# Patient Record
Sex: Male | Born: 1960 | Race: Black or African American | Hispanic: No | Marital: Single | Smoking: Current every day smoker
Health system: Southern US, Community
[De-identification: ages and names within clinical notes are randomized; demographics above are authoritative.]

## PROBLEM LIST (undated history)

## (undated) DIAGNOSIS — F419 Anxiety disorder, unspecified: Secondary | ICD-10-CM

## (undated) DIAGNOSIS — M1990 Unspecified osteoarthritis, unspecified site: Secondary | ICD-10-CM

## (undated) DIAGNOSIS — M109 Gout, unspecified: Secondary | ICD-10-CM

## (undated) DIAGNOSIS — I10 Essential (primary) hypertension: Secondary | ICD-10-CM

## (undated) HISTORY — DX: Unspecified osteoarthritis, unspecified site: M19.90

## (undated) HISTORY — PX: CYST REMOVAL TRUNK: SHX6283

## (undated) HISTORY — DX: Gout, unspecified: M10.9

## (undated) HISTORY — DX: Anxiety disorder, unspecified: F41.9

## (undated) HISTORY — DX: Essential (primary) hypertension: I10

## (undated) HISTORY — PX: SPLENECTOMY, TOTAL: SHX788

---

## 2007-09-13 ENCOUNTER — Emergency Department: Payer: Self-pay | Admitting: Emergency Medicine

## 2007-10-16 ENCOUNTER — Emergency Department: Payer: Self-pay | Admitting: Emergency Medicine

## 2008-01-04 ENCOUNTER — Emergency Department: Payer: Self-pay | Admitting: Emergency Medicine

## 2010-02-27 ENCOUNTER — Emergency Department: Payer: Self-pay | Admitting: Emergency Medicine

## 2011-03-13 ENCOUNTER — Emergency Department: Payer: Self-pay | Admitting: Emergency Medicine

## 2012-08-13 ENCOUNTER — Emergency Department: Payer: Self-pay | Admitting: Internal Medicine

## 2013-10-26 LAB — PSA: PSA: 0.4

## 2014-08-02 DIAGNOSIS — I10 Essential (primary) hypertension: Secondary | ICD-10-CM | POA: Insufficient documentation

## 2014-10-02 DIAGNOSIS — M1990 Unspecified osteoarthritis, unspecified site: Secondary | ICD-10-CM | POA: Insufficient documentation

## 2015-01-01 ENCOUNTER — Ambulatory Visit: Payer: Self-pay

## 2015-01-01 DIAGNOSIS — Z720 Tobacco use: Secondary | ICD-10-CM | POA: Insufficient documentation

## 2015-01-01 DIAGNOSIS — F1010 Alcohol abuse, uncomplicated: Secondary | ICD-10-CM | POA: Insufficient documentation

## 2015-01-01 LAB — BASIC METABOLIC PANEL
BUN: 15 mg/dL (ref 4–21)
Creatinine: 1.2 mg/dL (ref 0.6–1.3)
Glucose: 100 mg/dL
Potassium: 5.3 mmol/L (ref 3.4–5.3)
Sodium: 140 mmol/L (ref 137–147)

## 2015-01-01 LAB — CBC AND DIFFERENTIAL
HCT: 47 % (ref 41–53)
Hemoglobin: 15.9 g/dL (ref 13.5–17.5)
NEUTROS ABS: 3 /uL
PLATELETS: 259 10*3/uL (ref 150–399)
WBC: 6.8 10*3/mL

## 2015-01-01 LAB — LIPID PANEL
Cholesterol: 159 mg/dL (ref 0–200)
HDL: 66 mg/dL (ref 35–70)
LDL CALC: 80 mg/dL
TRIGLYCERIDES: 66 mg/dL (ref 40–160)

## 2015-01-01 LAB — HEPATIC FUNCTION PANEL
ALK PHOS: 162 U/L — AB (ref 25–125)
ALT: 18 U/L (ref 10–40)
AST: 21 U/L (ref 14–40)
BILIRUBIN, TOTAL: 0.2 mg/dL

## 2015-01-01 LAB — TSH: TSH: 2.91 u[IU]/mL (ref 0.41–5.90)

## 2015-09-18 ENCOUNTER — Telehealth: Payer: Self-pay

## 2015-09-18 NOTE — Telephone Encounter (Signed)
Received fax from medication management to refill hydroxyzine Hcl, pt has not been seen at [facility] since July 2016 needs to make and keep an appointment before we can refill any meds sent fax to medication management advising them .

## 2015-11-05 ENCOUNTER — Other Ambulatory Visit: Payer: Self-pay | Admitting: Nurse Practitioner

## 2015-11-27 ENCOUNTER — Other Ambulatory Visit: Payer: Self-pay | Admitting: Nurse Practitioner

## 2015-12-12 ENCOUNTER — Other Ambulatory Visit: Payer: Self-pay | Admitting: Urology

## 2016-01-28 DIAGNOSIS — M1990 Unspecified osteoarthritis, unspecified site: Secondary | ICD-10-CM

## 2016-01-28 DIAGNOSIS — Z720 Tobacco use: Secondary | ICD-10-CM

## 2016-01-28 DIAGNOSIS — I10 Essential (primary) hypertension: Secondary | ICD-10-CM

## 2016-01-28 DIAGNOSIS — F1010 Alcohol abuse, uncomplicated: Secondary | ICD-10-CM

## 2016-02-27 ENCOUNTER — Other Ambulatory Visit: Payer: Self-pay | Admitting: Urology

## 2016-04-23 ENCOUNTER — Other Ambulatory Visit: Payer: Self-pay | Admitting: Nurse Practitioner

## 2018-09-04 ENCOUNTER — Other Ambulatory Visit: Payer: Self-pay

## 2018-09-04 ENCOUNTER — Emergency Department
Admission: EM | Admit: 2018-09-04 | Discharge: 2018-09-04 | Disposition: A | Discharge status: Home / Self Care | Payer: Self-pay | Attending: Emergency Medicine | Admitting: Emergency Medicine

## 2018-09-04 ENCOUNTER — Encounter: Payer: Self-pay | Admitting: Emergency Medicine

## 2018-09-04 DIAGNOSIS — L03113 Cellulitis of right upper limb: Secondary | ICD-10-CM | POA: Insufficient documentation

## 2018-09-04 DIAGNOSIS — I10 Essential (primary) hypertension: Secondary | ICD-10-CM | POA: Insufficient documentation

## 2018-09-04 DIAGNOSIS — Z79899 Other long term (current) drug therapy: Secondary | ICD-10-CM | POA: Insufficient documentation

## 2018-09-04 DIAGNOSIS — F17210 Nicotine dependence, cigarettes, uncomplicated: Secondary | ICD-10-CM | POA: Insufficient documentation

## 2018-09-04 LAB — CBC WITH DIFFERENTIAL/PLATELET
Abs Immature Granulocytes: 0.01 10*3/uL (ref 0.00–0.07)
Basophils Absolute: 0 10*3/uL (ref 0.0–0.1)
Basophils Relative: 0 %
Eosinophils Absolute: 0.1 10*3/uL (ref 0.0–0.5)
Eosinophils Relative: 1 %
HCT: 42.9 % (ref 39.0–52.0)
Hemoglobin: 14.4 g/dL (ref 13.0–17.0)
Immature Granulocytes: 0 %
Lymphocytes Relative: 23 %
Lymphs Abs: 1.2 10*3/uL (ref 0.7–4.0)
MCH: 31.6 pg (ref 26.0–34.0)
MCHC: 33.6 g/dL (ref 30.0–36.0)
MCV: 94.3 fL (ref 80.0–100.0)
Monocytes Absolute: 0.4 10*3/uL (ref 0.1–1.0)
Monocytes Relative: 7 %
Neutro Abs: 3.5 10*3/uL (ref 1.7–7.7)
Neutrophils Relative %: 69 %
Platelets: 186 10*3/uL (ref 150–400)
RBC: 4.55 MIL/uL (ref 4.22–5.81)
RDW: 12.6 % (ref 11.5–15.5)
WBC: 5.1 10*3/uL (ref 4.0–10.5)
nRBC: 0 % (ref 0.0–0.2)

## 2018-09-04 LAB — COMPREHENSIVE METABOLIC PANEL
ALT: 19 U/L (ref 0–44)
AST: 21 U/L (ref 15–41)
Albumin: 3.8 g/dL (ref 3.5–5.0)
Alkaline Phosphatase: 116 U/L (ref 38–126)
Anion gap: 9 (ref 5–15)
BUN: 16 mg/dL (ref 6–20)
CO2: 22 mmol/L (ref 22–32)
Calcium: 8.7 mg/dL — ABNORMAL LOW (ref 8.9–10.3)
Chloride: 107 mmol/L (ref 98–111)
Creatinine, Ser: 1.03 mg/dL (ref 0.61–1.24)
GFR calc Af Amer: >60 mL/min (ref >60)
GFR calc non Af Amer: >60 mL/min (ref >60)
Glucose, Bld: 86 mg/dL (ref 70–99)
Potassium: 3.4 mmol/L — ABNORMAL LOW (ref 3.5–5.1)
Sodium: 138 mmol/L (ref 135–145)
Total Bilirubin: 0.6 mg/dL (ref 0.3–1.2)
Total Protein: 7.6 g/dL (ref 6.5–8.1)

## 2018-09-04 LAB — LACTIC ACID, PLASMA: Lactic Acid, Venous: 0.8 mmol/L (ref 0.5–1.9)

## 2018-09-04 MED ORDER — SULFAMETHOXAZOLE-TRIMETHOPRIM 800-160 MG PO TABS
1.0000 | ORAL_TABLET | Freq: Once | ORAL | Status: AC
  Administered 2018-09-04: 1 via ORAL
  Filled 2018-09-04: qty 1

## 2018-09-04 MED ORDER — ACETAMINOPHEN 500 MG PO TABS
1000.0000 mg | ORAL_TABLET | Freq: Once | ORAL | Status: AC
  Administered 2018-09-04: 1000 mg via ORAL

## 2018-09-04 MED ORDER — ACETAMINOPHEN 500 MG PO TABS
ORAL_TABLET | ORAL | Status: AC
  Administered 2018-09-04: 1000 mg via ORAL
  Filled 2018-09-04: qty 2

## 2018-09-04 MED ORDER — CEPHALEXIN 500 MG PO CAPS: 500.0000 mg | ORAL_CAPSULE | Freq: Three times a day (TID) | ORAL | 0 refills | Status: AC

## 2018-09-04 MED ORDER — CEPHALEXIN 500 MG PO CAPS
500.0000 mg | ORAL_CAPSULE | Freq: Once | ORAL | Status: AC
  Administered 2018-09-04: 500 mg via ORAL
  Filled 2018-09-04: qty 1

## 2018-09-04 MED ORDER — SULFAMETHOXAZOLE-TRIMETHOPRIM 800-160 MG PO TABS: 1.0000 | ORAL_TABLET | Freq: Two times a day (BID) | ORAL | 0 refills | Status: AC

## 2018-09-04 NOTE — ED Provider Notes (Signed)
[facility] Emergency Department Provider Note  ____________________________________________  Time seen: Approximately 5:56 PM  I have reviewed the triage vital signs and the nursing notes.   HISTORY  Chief Complaint Insect Bite    HPI Caleb Brooks is a 58 y.o. male presents to the emergency department with cellulitis of the right forearm with streaking into the upper arm.  Patient has had low-grade fever at home but denies chills.  Patient reports that 2 days ago, he was working in his yard.  He denies bites or stings from insects or other animals.  Patient reports that he awoke the next day with a rash around the wrist that looked like a small amount of blister formation.  Patient reports that rash worsened the next day and now erythema spans approximately 80% of forearm.  Patient is also noticed some right hand swelling.  He denies recent travel, prolonged immobilization or recent surgery.  He has a daily smoker.  No pleuritic chest pain or cough.  No prior history of hypercoagulable conditions.  He denies history of lower extremity DVT.  He denies prior history of cellulitis and IV drug use.  He denies history of diabetes or other immunosuppressive conditions.  No alleviating measures have been attempted        Past Medical History:  Diagnosis Date  . Anxiety   . Arthritis   . Hypertension     Patient Active Problem List   Diagnosis Date Noted  . Tobacco abuse 01/01/2015  . ETOH abuse 01/01/2015  . Arthritis 10/02/2014  . Hypertension 08/02/2014    Past Surgical History:  Procedure Laterality Date  . CYST REMOVAL TRUNK     Groin area around 2000    Prior to Admission medications   Medication Sig Start Date End Date Taking? Authorizing Provider  CELEBREX 100 MG capsule TAKE 1 CAPSULE BY MOUTH 1 OR 2 TIMES A DAY 12/12/15   McGowan, Larene Beach A, PA-C  cephALEXin (KEFLEX) 500 MG capsule Take 1 capsule (500 mg total) by mouth 3 (three) times daily for  7 days. 09/04/18 09/11/18  Lannie Fields, PA-C  gabapentin (NEURONTIN) 100 MG capsule Take 100 mg by mouth 2 (two) times daily.    [provider]  hydrochlorothiazide (HYDRODIURIL) 25 MG tablet Take 25 mg by mouth daily.    [provider]  loratadine (CLARITIN) 10 MG tablet Take 10 mg by mouth daily.    [provider]  sulfamethoxazole-trimethoprim (BACTRIM DS,SEPTRA DS) 800-160 MG tablet Take 1 tablet by mouth 2 (two) times daily for 7 days. 09/04/18 09/11/18  Lannie Fields, PA-C    Allergies Ibuprofen  Family History  Problem Relation Age of Onset  . Diabetes Father   . Hypertension Father   . Congestive Heart Failure Father   . Hypertension Sister   . CVA Sister   . Bipolar disorder Brother   . Schizophrenia Brother     Social History Social History   Tobacco Use  . Smoking status: Current Every Day Smoker    Packs/day: 0.50  . Smokeless tobacco: Never Used  Substance Use Topics  . Alcohol use: Yes    Comment: 40oz a day  . Drug use: Not on file     Review of Systems  Constitutional: No fever/chills Eyes: No visual changes. No discharge ENT: No upper respiratory complaints. Cardiovascular: no chest pain. Respiratory: no cough. No SOB. Gastrointestinal: No abdominal pain.  No nausea, no vomiting.  No diarrhea.  No constipation. Genitourinary:  Negative for dysuria. No hematuria Musculoskeletal: Negative for musculoskeletal pain. Skin: Patient has cellulitis.  Neurological: Negative for headaches, focal weakness or numbness.   ____________________________________________   PHYSICAL EXAM:  VITAL SIGNS: ED Triage Vitals  Enc Vitals Group     BP 09/04/18 1605 (!) 147/107     Pulse Rate 09/04/18 1605 72     Resp 09/04/18 1605 14     Temp 09/04/18 1605 99.1 F (37.3 C)     Temp Source 09/04/18 1605 Oral     SpO2 09/04/18 1605 100 %     Weight 09/04/18 1602 185 lb (83.9 kg)     Height 09/04/18 1602 5\' 11"  (1.803 m)     Head  Circumference --      Peak Flow --      Pain Score 09/04/18 1602 7     Pain Loc --      Pain Edu? --      Excl. in [place]? --      Constitutional: Alert and oriented. Well appearing and in no acute distress. Eyes: Conjunctivae are normal. PERRL. EOMI. Head: Atraumatic. Cardiovascular: Normal rate, regular rhythm. Normal S1 and S2.  Good peripheral circulation. Respiratory: Normal respiratory effort without tachypnea or retractions. Lungs CTAB. Good air entry to the bases with no decreased or absent breath sounds. Gastrointestinal: Bowel sounds 4 quadrants. Soft and nontender to palpation. No guarding or rigidity. No palpable masses. No distention. No CVA tenderness. Musculoskeletal: Full range of motion to all extremities. No gross deformities appreciated. Palpable radial pulse, right.  Neurologic:  Normal speech and language. No gross focal neurologic deficits are appreciated.  Skin: Patient has cellulitis spanning approximately 80% of the right forearm with streaking approximately 3 cm above the antecubital fossa on the right.  Patient has bulla formation around the wrist.  Bulla are small in size, approximately 1/2 cm. No palpable induration of fluctuance.  Psychiatric: Mood and affect are normal. Speech and behavior are normal. Patient exhibits appropriate insight and judgement.   ____________________________________________   LABS (all labs ordered are listed, but only abnormal results are displayed)  Labs Reviewed  COMPREHENSIVE METABOLIC PANEL - Abnormal; Notable for the following components:      Result Value   Potassium 3.4 (*)    Calcium 8.7 (*)    All other components within normal limits  CULTURE, BLOOD (ROUTINE X 2)  CULTURE, BLOOD (ROUTINE X 2)  CBC WITH DIFFERENTIAL/PLATELET  LACTIC ACID, PLASMA   ____________________________________________  EKG   ____________________________________________  RADIOLOGY   No results  found.  ____________________________________________    PROCEDURES  Procedure(s) performed:    Procedures    Medications  sulfamethoxazole-trimethoprim (BACTRIM DS,SEPTRA DS) 800-160 MG per tablet 1 tablet (has no administration in time range)  cephALEXin (KEFLEX) capsule 500 mg (has no administration in time range)     ____________________________________________   INITIAL IMPRESSION / ASSESSMENT AND PLAN / ED COURSE  Pertinent labs & imaging results that were available during my care of the patient were reviewed by me and considered in my medical decision making (see chart for details).  Review of the [place] CSRS was performed in accordance of the [place] prior to dispensing any controlled drugs.           Assessment and plan Cellulitis Patient presents to the emergency department with right forearm cellulitis with streaking.  Patient had low-grade fever in triage but vital signs were otherwise reassuring. In concern for rapidly progressing cellulitis, basic labs were obtained.  No leukocytosis  was identified on CBC.  Lactic acid was within range.  Patient was very resistant to staying in the hospital for admission for IV antibiotics.  Patient was started on Bactrim and Keflex in the emergency department with strict return precautions to return for worsening cellulitis.  Patient voiced understanding.  All patient questions were answered.     ____________________________________________  FINAL CLINICAL IMPRESSION(S) / ED DIAGNOSES  Final diagnoses:  Cellulitis of right upper extremity      NEW MEDICATIONS STARTED DURING THIS VISIT:  ED Discharge Orders         Ordered    sulfamethoxazole-trimethoprim (BACTRIM DS,SEPTRA DS) 800-160 MG tablet  2 times daily     09/04/18 1740    cephALEXin (KEFLEX) 500 MG capsule  3 times daily     09/04/18 1740              This chart was dictated using voice recognition software/Dragon. Despite best efforts to proofread,  errors can occur which can change the meaning. Any change was purely unintentional.    Lannie Fields, PA-C 09/04/18 1815    Lavonia Drafts, MD 09/04/18 [phone]

## 2018-09-04 NOTE — ED Triage Notes (Signed)
Pt presents to ED via POV with c/o insect bite to R hand and forearm yesterday morning. Pt presents with swelling to R hand today. Pt with full ROM, +sensation, cap refill < 3 seconds.

## 2018-09-04 NOTE — Discharge Instructions (Signed)
Return to the emergency department for worsening right arm redness or swelling.  Take Bactrim twice daily for the next seven days and Keflex three times daily for the next seven days.

## 2018-09-06 ENCOUNTER — Telehealth: Payer: Self-pay | Admitting: Emergency Medicine

## 2018-09-06 LAB — BLOOD CULTURE ID PANEL (REFLEXED)

## 2018-09-06 NOTE — Telephone Encounter (Signed)
Called patient due to positive blood culture to see how he is doing.  He was sleeping, but I explained that if he felt he was getting sicker he should return here.  He said he never got his medications due to could not afford.  I explained medication management and the importance of getting his medications.  I called meds to med management and gave patient number so they can call him.  He will bring his paper prescriptions to them when he picks meds up.

## 2018-09-08 LAB — CULTURE, BLOOD (ROUTINE X 2): Special Requests: ADEQUATE

## 2018-09-09 ENCOUNTER — Encounter: Payer: Self-pay | Admitting: Emergency Medicine

## 2018-09-09 ENCOUNTER — Other Ambulatory Visit: Payer: Self-pay

## 2018-09-09 ENCOUNTER — Emergency Department
Admission: EM | Admit: 2018-09-09 | Discharge: 2018-09-09 | Disposition: A | Discharge status: Home / Self Care | Payer: Self-pay | Attending: Emergency Medicine | Admitting: Emergency Medicine

## 2018-09-09 DIAGNOSIS — Z79899 Other long term (current) drug therapy: Secondary | ICD-10-CM | POA: Insufficient documentation

## 2018-09-09 DIAGNOSIS — R799 Abnormal finding of blood chemistry, unspecified: Secondary | ICD-10-CM | POA: Insufficient documentation

## 2018-09-09 DIAGNOSIS — F17200 Nicotine dependence, unspecified, uncomplicated: Secondary | ICD-10-CM | POA: Insufficient documentation

## 2018-09-09 DIAGNOSIS — I10 Essential (primary) hypertension: Secondary | ICD-10-CM | POA: Insufficient documentation

## 2018-09-09 LAB — CULTURE, BLOOD (ROUTINE X 2): Culture: NO GROWTH

## 2018-09-09 NOTE — ED Provider Notes (Signed)
[facility] Emergency Department Provider Note   ____________________________________________    I have reviewed the triage vital signs and the nursing notes.   HISTORY  Chief Complaint Positive blood culture    HPI Caleb Brooks is a 58 y.o. male who presents for evaluation after being notified by pharmacy that he had a positive blood culture.  Apparently patient had blood culture drawn on March 29, at the time he had what appeared to be a cellulitis of the right arm.  He reports 2 days ago he started taking his antibiotics and symptoms have significantly improved.  He reports swelling is largely resolved and the burning pain has improved as well.  Today he feels quite well, no fevers or chills.  No nausea or vomiting.   Past Medical History:  Diagnosis Date  . Anxiety   . Arthritis   . Hypertension     Patient Active Problem List   Diagnosis Date Noted  . Tobacco abuse 01/01/2015  . ETOH abuse 01/01/2015  . Arthritis 10/02/2014  . Hypertension 08/02/2014    Past Surgical History:  Procedure Laterality Date  . CYST REMOVAL TRUNK     Groin area around 2000    Prior to Admission medications   Medication Sig Start Date End Date Taking? Authorizing Provider  CELEBREX 100 MG capsule TAKE 1 CAPSULE BY MOUTH 1 OR 2 TIMES A DAY 12/12/15   McGowan, Larene Beach A, PA-C  cephALEXin (KEFLEX) 500 MG capsule Take 1 capsule (500 mg total) by mouth 3 (three) times daily for 7 days. 09/04/18 09/11/18  Lannie Fields, PA-C  gabapentin (NEURONTIN) 100 MG capsule Take 100 mg by mouth 2 (two) times daily.    [provider]  hydrochlorothiazide (HYDRODIURIL) 25 MG tablet Take 25 mg by mouth daily.    [provider]  loratadine (CLARITIN) 10 MG tablet Take 10 mg by mouth daily.    [provider]  sulfamethoxazole-trimethoprim (BACTRIM DS,SEPTRA DS) 800-160 MG tablet Take 1 tablet by mouth 2 (two) times daily for 7 days. 09/04/18 09/11/18   Lannie Fields, PA-C     Allergies Ibuprofen  Family History  Problem Relation Age of Onset  . Diabetes Father   . Hypertension Father   . Congestive Heart Failure Father   . Hypertension Sister   . CVA Sister   . Bipolar disorder Brother   . Schizophrenia Brother     Social History Social History   Tobacco Use  . Smoking status: Current Every Day Smoker    Packs/day: 0.50  . Smokeless tobacco: Never Used  Substance Use Topics  . Alcohol use: Yes    Comment: 40oz a day  . Drug use: Not on file    Review of Systems  Constitutional: No fever/chills    Gastrointestinal: No abdominal pain.  No nausea, no vomiting.   . Musculoskeletal: Negative for joint pain Skin: Rash is resolved Neurological: Negative for headaches     ____________________________________________   PHYSICAL EXAM:  VITAL SIGNS: ED Triage Vitals  Enc Vitals Group     BP 09/09/18 1702 (!) 160/98     Pulse Rate 09/09/18 1702 69     Resp 09/09/18 1702 17     Temp 09/09/18 1702 98.3 F (36.8 C)     Temp Source 09/09/18 1702 Oral     SpO2 09/09/18 1702 100 %     Weight 09/09/18 1658 83.9 kg (185 lb)     Height 09/09/18 1658 1.803 m (  5\' 11" )     Head Circumference --      Peak Flow --      Pain Score 09/09/18 1658 0     Pain Loc --      Pain Edu? --      Excl. in [place]? --      Constitutional: Alert and oriented. No acute distress. Pleasant and interactive Eyes: Conjunctivae are normal.   Nose: No congestion/rhinnorhea. Mouth/Throat: Mucous membranes are moist.   Cardiovascular: Normal rate, regular rhythm.  Respiratory: Normal respiratory effort.  No retractions.  Musculoskeletal: No lower extremity tenderness nor edema.   Neurologic:  Normal speech and language. No gross focal neurologic deficits are appreciated.   Skin:  Skin is warm, dry and intact.  Area of cellulitis has resolved, normal exam   ____________________________________________   LABS (all labs ordered are  listed, but only abnormal results are displayed)  Labs Reviewed - No data to display ____________________________________________  EKG   ____________________________________________  RADIOLOGY  None ____________________________________________   PROCEDURES  Procedure(s) performed: No  Procedures   Critical Care performed: No ____________________________________________   INITIAL IMPRESSION / ASSESSMENT AND PLAN / ED COURSE  Pertinent labs & imaging results that were available during my care of the patient were reviewed by me and considered in my medical decision making (see chart for details).  Patient's exam is quite reassuring, he is well-appearing afebrile with no tachycardia.  Blood culture was likely contaminated, no indication to redraw today.   ____________________________________________   FINAL CLINICAL IMPRESSION(S) / ED DIAGNOSES  Final diagnoses:  Contamination of blood culture      NEW MEDICATIONS STARTED DURING THIS VISIT:  New Prescriptions   No medications on file     Note:  This document was prepared using Dragon voice recognition software and may include unintentional dictation errors.   Lavonia Drafts, MD 09/09/18 [phone]

## 2018-09-09 NOTE — Consult Note (Signed)
Positive blood culture is likely a contaminate. Discussed with Dr. Kerman Passey the patient's BCx and patient should follow up repeat sample to ensure the validity of the sample.   Called and spoke with patient about positive blood cultures. He reports that he will come back to the ED to have his labs drawn either today or tomorrow- if it is warm ("I prefer going out on warm days").   Patient denies fevers and reports that the cellulitis is improving. Additionally, it was previously noted that the patient was not taking his medication d/t not being able to afford the antibiotics. Since that time, patient reports he has now been taking both antibiotics for 2 days.    Thank you for allowing pharmacy to be a part of this patient's care.  Kristeen Miss, PharmD

## 2018-09-09 NOTE — ED Triage Notes (Signed)
Pharmacy called patient a few days ago to return for repeat cultures.  Genia Hotter also left patient message about helping to get his meds.  Patient here because someone called him to have blood drawn.

## 2019-01-04 ENCOUNTER — Other Ambulatory Visit: Payer: Self-pay

## 2019-01-04 ENCOUNTER — Ambulatory Visit: Payer: Self-pay

## 2019-01-25 ENCOUNTER — Ambulatory Visit: Payer: Self-pay | Admitting: Gerontology

## 2019-01-25 ENCOUNTER — Other Ambulatory Visit: Payer: Self-pay

## 2019-01-26 LAB — URINALYSIS
Bilirubin, UA: NEGATIVE
Glucose, UA: NEGATIVE
Ketones, UA: NEGATIVE
Leukocytes,UA: NEGATIVE
Nitrite, UA: NEGATIVE
Protein,UA: NEGATIVE
RBC, UA: NEGATIVE
Specific Gravity, UA: 1.017 (ref 1.005–1.030)
Urobilinogen, Ur: 0.2 mg/dL (ref 0.2–1.0)
pH, UA: 5 (ref 5.0–7.5)

## 2019-01-26 LAB — CBC WITH DIFFERENTIAL/PLATELET
Basophils Absolute: 0 10*3/uL (ref 0.0–0.2)
Basos: 1 %
EOS (ABSOLUTE): 0.1 10*3/uL (ref 0.0–0.4)
Eos: 3 %
Hematocrit: 46.3 % (ref 37.5–51.0)
Hemoglobin: 15.7 g/dL (ref 13.0–17.7)
Immature Grans (Abs): 0 10*3/uL (ref 0.0–0.1)
Immature Granulocytes: 0 %
Lymphocytes Absolute: 1.7 10*3/uL (ref 0.7–3.1)
Lymphs: 41 %
MCH: 31.3 pg (ref 26.6–33.0)
MCHC: 33.9 g/dL (ref 31.5–35.7)
MCV: 92 fL (ref 79–97)
Monocytes Absolute: 0.4 10*3/uL (ref 0.1–0.9)
Monocytes: 10 %
Neutrophils Absolute: 1.9 10*3/uL (ref 1.4–7.0)
Neutrophils: 45 %
Platelets: 227 10*3/uL (ref 150–450)
RBC: 5.02 x10E6/uL (ref 4.14–5.80)
RDW: 12.7 % (ref 11.6–15.4)
WBC: 4.2 10*3/uL (ref 3.4–10.8)

## 2019-01-26 LAB — COMPREHENSIVE METABOLIC PANEL
ALT: 10 IU/L (ref 0–44)
AST: 17 IU/L (ref 0–40)
Albumin/Globulin Ratio: 1.2 (ref 1.2–2.2)
Albumin: 4.4 g/dL (ref 3.8–4.9)
Alkaline Phosphatase: 123 IU/L — ABNORMAL HIGH (ref 39–117)
BUN/Creatinine Ratio: 9 (ref 9–20)
BUN: 10 mg/dL (ref 6–24)
Bilirubin Total: 0.2 mg/dL (ref 0.0–1.2)
CO2: 25 mmol/L (ref 20–29)
Calcium: 9.4 mg/dL (ref 8.7–10.2)
Chloride: 104 mmol/L (ref 96–106)
Creatinine, Ser: 1.08 mg/dL (ref 0.76–1.27)
GFR calc Af Amer: 88 mL/min/{1.73_m2} (ref >59)
GFR calc non Af Amer: 76 mL/min/{1.73_m2} (ref >59)
Globulin, Total: 3.6 g/dL (ref 1.5–4.5)
Glucose: 83 mg/dL (ref 65–99)
Potassium: 4.2 mmol/L (ref 3.5–5.2)
Sodium: 141 mmol/L (ref 134–144)
Total Protein: 8 g/dL (ref 6.0–8.5)

## 2019-01-26 LAB — LIPID PANEL
Chol/HDL Ratio: 2.4 ratio (ref 0.0–5.0)
Cholesterol, Total: 141 mg/dL (ref 100–199)
HDL: 60 mg/dL (ref >39)
LDL Calculated: 67 mg/dL (ref 0–99)
Triglycerides: 68 mg/dL (ref 0–149)
VLDL Cholesterol Cal: 14 mg/dL (ref 5–40)

## 2019-01-26 LAB — HEMOGLOBIN A1C
Est. average glucose Bld gHb Est-mCnc: 108 mg/dL
Hgb A1c MFr Bld: 5.4 % (ref 4.8–5.6)

## 2019-02-07 ENCOUNTER — Ambulatory Visit: Payer: Self-pay | Admitting: Gerontology

## 2019-02-07 ENCOUNTER — Encounter: Payer: Self-pay | Admitting: Gerontology

## 2019-02-07 ENCOUNTER — Other Ambulatory Visit: Payer: Self-pay

## 2019-02-07 VITALS — BP 143/101 | HR 61 | Ht 71.0 in | Wt 182.0 lb

## 2019-02-07 DIAGNOSIS — Z7689 Persons encountering health services in other specified circumstances: Secondary | ICD-10-CM | POA: Insufficient documentation

## 2019-02-07 DIAGNOSIS — K6289 Other specified diseases of anus and rectum: Secondary | ICD-10-CM

## 2019-02-07 DIAGNOSIS — Z8739 Personal history of other diseases of the musculoskeletal system and connective tissue: Secondary | ICD-10-CM

## 2019-02-07 DIAGNOSIS — R030 Elevated blood-pressure reading, without diagnosis of hypertension: Secondary | ICD-10-CM | POA: Insufficient documentation

## 2019-02-07 DIAGNOSIS — L602 Onychogryphosis: Secondary | ICD-10-CM | POA: Insufficient documentation

## 2019-02-07 DIAGNOSIS — Z720 Tobacco use: Secondary | ICD-10-CM

## 2019-02-07 DIAGNOSIS — H6691 Otitis media, unspecified, right ear: Secondary | ICD-10-CM | POA: Insufficient documentation

## 2019-02-07 DIAGNOSIS — L84 Corns and callosities: Secondary | ICD-10-CM | POA: Insufficient documentation

## 2019-02-07 MED ORDER — NEOMYCIN-POLYMYXIN-HC 1 % OT SOLN: 3.0000 [drp] | Freq: Four times a day (QID) | OTIC | 0 refills | Status: DC

## 2019-02-07 MED ORDER — BACITRACIN-NEOMYCIN-POLYMYXIN OINTMENT TUBE: 1.0000 "application " | TOPICAL_OINTMENT | CUTANEOUS | 0 refills | Status: DC | PRN

## 2019-02-07 MED ORDER — BACITRACIN-NEOMYCIN-POLYMYXIN OINTMENT TUBE: 1.0000 "application " | TOPICAL_OINTMENT | Freq: Every day | CUTANEOUS | 1 refills | Status: DC

## 2019-02-07 MED ORDER — CELECOXIB 100 MG PO CAPS: ORAL_CAPSULE | ORAL | 0 refills | Status: DC

## 2019-02-07 MED ORDER — GABAPENTIN 100 MG PO CAPS: 100.0000 mg | ORAL_CAPSULE | Freq: Two times a day (BID) | ORAL | 1 refills | Status: DC

## 2019-02-07 NOTE — Progress Notes (Signed)
Patient ID: Caleb Brooks, male   DOB: 03-19-1961, 58 y.o.   MRN: [id number]  Chief Complaint  Patient presents with  . Establish Care    Tender under R big toenail, callus on L toe    HPI Caleb Brooks is a 58 y.o. male who presents to establish care and evaluation of his chronic problems. He reports having a history of intermittent muscle spasm to his back and has being taking 100 mg Celebrex bid as needed with relief. He denies saddle anesthesia, bladder or urinary incontinence and muscle weakness. 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 He states that his last gout exacerbation was 2 months ago. 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 No information was available. Currently, he denies eruption of perianal cysts, states that applying antibiotic ointment relieves itching to keloid. He denies erythema, and swelling to site.   Currently he reports experiencing constant non radiating 8/10 pain to callus on the sesamoid aspect of the plantar section of his left foot. He denies erythema,  to site. He states that it has being going on for 2 weeks. He also states that the nail to his right great toe is thick, and painful when touched. He states that it affects his sleep because it hurts when any item touches the right great toe. He reports having right otalgia that has being going on for 1 week, he denies injury, tinnitus, facial pain, sinusitis, fever, chills and rhinorrhea. He smokes 3-10 cigarettes daily and admits the desire to quit.He states that he is doing well and denies further concern.   Past Medical History:  Diagnosis Date  . Anxiety   . Arthritis   . Hypertension     Past Surgical History:  Procedure Laterality Date  . CYST REMOVAL TRUNK     Groin area around 2000    Family History   Problem Relation Age of Onset  . Diabetes Father   . Hypertension Father   . Congestive Heart Failure Father   . Hypertension Sister   . CVA Sister   . Bipolar disorder Brother   . Schizophrenia Brother     Social History Social History   Tobacco Use  . Smoking status: Current Every Day Smoker    Packs/day: 0.50    Types: Cigarettes  . Smokeless tobacco: Never Used  Substance Use Topics  . Alcohol use: Yes    Comment: 32 oz a day  . Drug use: Yes    Types: Marijuana    Allergies  Allergen Reactions  . Ibuprofen     Current Outpatient Medications  Medication Sig Dispense Refill  . CELEBREX 100 MG capsule TAKE 1 CAPSULE BY MOUTH 1 OR 2 TIMES A DAY (Patient not taking: Reported on 02/07/2019) 90 capsule 0  . gabapentin (NEURONTIN) 100 MG capsule Take 100 mg by mouth 2 (two) times daily.    . hydrochlorothiazide (HYDRODIURIL) 25 MG tablet Take 25 mg by mouth daily.    Marland Kitchen loratadine (CLARITIN) 10 MG tablet Take 10 mg by mouth daily.     No current facility-administered medications for this visit.     Review of Systems Review of Systems  Constitutional: Negative.   HENT: Positive for ear pain (right ear pain). Negative for ear discharge, hearing loss, postnasal drip, rhinorrhea, sinus pressure,  sinus pain, sneezing, sore throat and tinnitus.   Eyes: Negative.   Respiratory: Negative.   Cardiovascular: Negative.   Gastrointestinal: Negative.   Endocrine: Negative.   Genitourinary: Negative.   Musculoskeletal:        Chronic Intermittent back spasm  Skin:       Keloid to perianal area  Neurological: Negative.   Hematological: Negative.   Psychiatric/Behavioral: Negative.     Blood pressure (!) 143/101, pulse 61, height 5\' 11"  (1.803 m), weight 182 lb (82.6 kg), SpO2 99 %.  Physical Exam Physical Exam Constitutional:      Appearance: Normal appearance.  HENT:     Head: Normocephalic and atraumatic.     Right Ear: A middle ear effusion is present. There is no  impacted cerumen. Tympanic membrane is erythematous.     Nose: Nose normal.     Mouth/Throat:     Mouth: Mucous membranes are moist.  Eyes:     Extraocular Movements: Extraocular movements intact.     Pupils: Pupils are equal, round, and reactive to light.  Neck:     Musculoskeletal: Normal range of motion.  Cardiovascular:     Rate and Rhythm: Normal rate and regular rhythm.     Pulses: Normal pulses.     Heart sounds: Normal heart sounds.  Pulmonary:     Effort: Pulmonary effort is normal.     Breath sounds: Normal breath sounds.  Abdominal:     General: Abdomen is flat. Bowel sounds are normal.     Palpations: Abdomen is soft.  Genitourinary:    Comments: Deferred per patient. Musculoskeletal: Normal range of motion.       Feet:  Skin:    General: Skin is warm and dry.  Neurological:     General: No focal deficit present.     Mental Status: He is alert and oriented to person, place, and time.  Psychiatric:        Mood and Affect: Mood normal.        Behavior: Behavior normal.        Thought Content: Thought content normal.        Judgment: Judgment normal.     Data Reviewed Lab review and past medical history was reviewed.  Assessment and Plan 1. Tobacco abuse - He was strongly encouraged on smoking cessation  2. Callus of foot - He was encouraged to complete charity care application for - Ambulatory referral to Podiatry  3. Nail thickening  -He was encouraged to complete charity care application for - Ambulatory referral to Podiatry  4. Encounter to establish care - Routine labs and past medical history was reviewed.  5. Right acute otitis media - He was advised to continue ear drops and notify clinic for worsening symptoms. He was educated on medication side effect. - neomycin-bacitracin-polymyxin (NEOSPORIN) OINT; Apply 1 application topically daily.  Dispense: 30 g; Refill: 1 - NEOMYCIN-POLYMYXIN-HYDROCORTISONE (CORTISPORIN) 1 % SOLN OTIC solution; Place  3 drops into the right ear 4 (four) times daily.  Dispense: 10 mL; Refill: 0  6. History of muscle spasm - He will continue on current treatment regimen, will re evaluate during follow up visit. - celecoxib (CELEBREX) 100 MG capsule; TAKE 1 CAPSULE BY MOUTH twice a day as needed  Dispense: 30 capsule; Refill: 0  7. Cyst of perianal area - He will continue on current treatment regimen and was advised to notify clinic for worsening symptoms. - gabapentin (NEURONTIN) 100 MG capsule; Take 1 capsule (100 mg total) by  mouth 2 (two) times daily. Take 1 capsule (100 mg total) by mouth 2 times daily as needed  Dispense: 60 capsule; Refill: 1 - neomycin-bacitracin-polymyxin (NEOSPORIN) OINT; Apply 1 application topically as needed for wound care.  Dispense: 30 g; Refill: 0   8. Elevated blood pressure reading - He was advised to monitor and document blood pressure and notify clinic if it's greater than 140/90. He defers treatment.  Follow up in 6 weeks ( 03/21/2019) if symptom worsens  Raetta Agostinelli E Porshia Blizzard 02/07/2019, 3:31 PM

## 2019-02-08 ENCOUNTER — Other Ambulatory Visit: Payer: Self-pay

## 2019-02-08 DIAGNOSIS — H6691 Otitis media, unspecified, right ear: Secondary | ICD-10-CM

## 2019-03-02 NOTE — Progress Notes (Signed)
Patient was not seen.

## 2019-03-21 ENCOUNTER — Encounter: Payer: Self-pay | Admitting: Gerontology

## 2019-03-21 ENCOUNTER — Ambulatory Visit: Payer: Self-pay | Admitting: Gerontology

## 2019-03-21 VITALS — BP 142/95 | HR 61 | Ht 71.0 in | Wt 182.0 lb

## 2019-03-21 DIAGNOSIS — I10 Essential (primary) hypertension: Secondary | ICD-10-CM

## 2019-03-21 DIAGNOSIS — B351 Tinea unguium: Secondary | ICD-10-CM | POA: Insufficient documentation

## 2019-03-21 DIAGNOSIS — Z720 Tobacco use: Secondary | ICD-10-CM

## 2019-03-21 MED ORDER — TERBINAFINE HCL 1 % EX CREA: 1.0000 "application " | TOPICAL_CREAM | Freq: Two times a day (BID) | CUTANEOUS | 0 refills | Status: DC

## 2019-03-21 NOTE — Patient Instructions (Signed)
Smoking Tobacco Information, Adult Smoking tobacco can be harmful to your health. Tobacco contains a poisonous (toxic), colorless chemical called nicotine. Nicotine is addictive. It changes the brain and can make it hard to stop smoking. Tobacco also has other toxic chemicals that can hurt your body and raise your risk of many cancers. How can smoking tobacco affect me? Smoking tobacco puts you at risk for:  Cancer. Smoking is most commonly associated with lung cancer, but can also lead to cancer in other parts of the body.  Chronic obstructive pulmonary disease (COPD). This is a long-term lung condition that makes it hard to breathe. It also gets worse over time.  High blood pressure (hypertension), heart disease, stroke, or heart attack.  Lung infections, such as pneumonia.  Cataracts. This is when the lenses in the eyes become clouded.  Digestive problems. This may include peptic ulcers, heartburn, and gastroesophageal reflux disease (GERD).  Oral health problems, such as gum disease and tooth loss.  Loss of taste and smell. Smoking can affect your appearance by causing:  Wrinkles.  Yellow or stained teeth, fingers, and fingernails. Smoking tobacco can also affect your social life, because:  It may be challenging to find places to smoke when away from home. Many workplaces, restaurants, hotels, and public places are tobacco-free.  Smoking is expensive. This is due to the cost of tobacco and the long-term costs of treating health problems from smoking.  Secondhand smoke may affect those around you. Secondhand smoke can cause lung cancer, breathing problems, and heart disease. Children of smokers have a higher risk for: ? Sudden infant death syndrome (SIDS). ? Ear infections. ? Lung infections. If you currently smoke tobacco, quitting now can help you:  Lead a longer and healthier life.  Look, smell, breathe, and feel better over time.  Save money.  Protect others from the  harms of secondhand smoke. What actions can I take to prevent health problems? Quit smoking   Do not start smoking. Quit if you already do.  Make a plan to quit smoking and commit to it. Look for programs to help you and ask your health care provider for recommendations and ideas.  Set a date and write down all the reasons you want to quit.  Let your friends and family know you are quitting so they can help and support you. Consider finding friends who also want to quit. It can be easier to quit with someone else, so that you can support each other.  Talk with your health care provider about using nicotine replacement medicines to help you quit, such as gum, lozenges, patches, sprays, or pills.  Do not replace cigarette smoking with electronic cigarettes, which are commonly called e-cigarettes. The safety of e-cigarettes is not known, and some may contain harmful chemicals.  If you try to quit but return to smoking, stay positive. It is common to slip up when you first quit, so take it one day at a time.  Be prepared for cravings. When you feel the urge to smoke, chew gum or suck on hard candy. Lifestyle  Stay busy and take care of your body.  Drink enough fluid to keep your urine pale yellow.  Get plenty of exercise and eat a healthy diet. This can help prevent weight gain after quitting.  Monitor your eating habits. Quitting smoking can cause you to have a larger appetite than when you smoke.  Find ways to relax. Go out with friends or family to a movie or a restaurant   where people do not smoke.  Ask your health care provider about having regular tests (screenings) to check for cancer. This may include blood tests, imaging tests, and other tests.  Find ways to manage your stress, such as meditation, yoga, or exercise. Where to find support To get support to quit smoking, consider:  Asking your health care provider for more information and resources.  Taking classes to learn  more about quitting smoking.  Looking for local organizations that offer resources about quitting smoking.  Joining a support group for people who want to quit smoking in your local community.  Calling the smokefree.gov counselor helpline: 1-800-Quit-Now 7034887102) Where to find more information You may find more information about quitting smoking from:  HelpGuide.org: www.helpguide.org  https://hall.com/: smokefree.gov  American Lung Association: www.lung.org Contact a health care provider if you:  Have problems breathing.  Notice that your lips, nose, or fingers turn blue.  Have chest pain.  Are coughing up blood.  Feel faint or you pass out.  Have other health changes that cause you to worry. Summary  Smoking tobacco can negatively affect your health, the health of those around you, your finances, and your social life.  Do not start smoking. Quit if you already do. If you need help quitting, ask your health care provider.  Think about joining a support group for people who want to quit smoking in your local community. There are many effective programs that will help you to quit this behavior. This information is not intended to replace advice given to you by your health care provider. Make sure you discuss any questions you have with your health care provider. Document Released: 06/09/2016 Document Revised: 07/14/2017 Document Reviewed: 06/09/2016 Elsevier Patient Education  2020 [place] DASH stands for "Dietary Approaches to Stop Hypertension." The DASH eating plan is a healthy eating plan that has been shown to reduce high blood pressure (hypertension). It may also reduce your risk for type 2 diabetes, heart disease, and stroke. The DASH eating plan may also help with weight loss. What are tips for following this plan?  General guidelines  Avoid eating more than 2,300 mg (milligrams) of salt (sodium) a day. If you have hypertension, you may  need to reduce your sodium intake to 1,500 mg a day.  Limit alcohol intake to no more than 1 drink a day for nonpregnant women and 2 drinks a day for men. One drink equals 12 oz of beer, 5 oz of wine, or 1 oz of hard liquor.  Work with your health care provider to maintain a healthy body weight or to lose weight. Ask what an ideal weight is for you.  Get at least 30 minutes of exercise that causes your heart to beat faster (aerobic exercise) most days of the week. Activities may include walking, swimming, or biking.  Work with your health care provider or diet and nutrition specialist (dietitian) to adjust your eating plan to your individual calorie needs. Reading food labels   Check food labels for the amount of sodium per serving. Choose foods with less than 5 percent of the Daily Value of sodium. Generally, foods with less than 300 mg of sodium per serving fit into this eating plan.  To find whole grains, look for the word "whole" as the first word in the ingredient list. Shopping  Buy products labeled as "low-sodium" or "no salt added."  Buy fresh foods. Avoid canned foods and premade or frozen meals. Cooking  Avoid adding salt  when cooking. Use salt-free seasonings or herbs instead of table salt or sea salt. Check with your health care provider or pharmacist before using salt substitutes.  Do not fry foods. Cook foods using healthy methods such as baking, boiling, grilling, and broiling instead.  Cook with heart-healthy oils, such as olive, canola, soybean, or sunflower oil. Meal planning  Eat a balanced diet that includes: ? 5 or more servings of fruits and vegetables each day. At each meal, try to fill half of your plate with fruits and vegetables. ? Up to 6-8 servings of whole grains each day. ? Less than 6 oz of lean meat, poultry, or fish each day. A 3-oz serving of meat is about the same size as a deck of cards. One egg equals 1 oz. ? 2 servings of low-fat dairy each day.  ? A serving of nuts, seeds, or beans 5 times each week. ? Heart-healthy fats. Healthy fats called Omega-3 fatty acids are found in foods such as flaxseeds and coldwater fish, like sardines, salmon, and mackerel.  Limit how much you eat of the following: ? Canned or prepackaged foods. ? Food that is high in trans fat, such as fried foods. ? Food that is high in saturated fat, such as fatty meat. ? Sweets, desserts, sugary drinks, and other foods with added sugar. ? Full-fat dairy products.  Do not salt foods before eating.  Try to eat at least 2 vegetarian meals each week.  Eat more home-cooked food and less restaurant, buffet, and fast food.  When eating at a restaurant, ask that your food be prepared with less salt or no salt, if possible. What foods are recommended? The items listed may not be a complete list. Talk with your dietitian about what dietary choices are best for you. Grains Whole-grain or whole-wheat bread. Whole-grain or whole-wheat pasta. Brown rice. [place]. Bulgur. Whole-grain and low-sodium cereals. Pita bread. Low-fat, low-sodium crackers. Whole-wheat flour tortillas. Vegetables Fresh or frozen vegetables (raw, steamed, roasted, or grilled). Low-sodium or reduced-sodium tomato and vegetable juice. Low-sodium or reduced-sodium tomato sauce and tomato paste. Low-sodium or reduced-sodium canned vegetables. Fruits All fresh, dried, or frozen fruit. Canned fruit in natural juice (without added sugar). Meat and other protein foods Skinless chicken or [place]. Ground chicken or [place]. Pork with fat trimmed off. Fish and seafood. Egg whites. Dried beans, peas, or lentils. Unsalted nuts, nut butters, and seeds. Unsalted canned beans. Lean cuts of beef with fat trimmed off. Low-sodium, lean deli meat. Dairy Low-fat (1%) or fat-free (skim) milk. Fat-free, low-fat, or reduced-fat cheeses. Nonfat, low-sodium ricotta or cottage cheese. Low-fat or nonfat yogurt. Low-fat,  low-sodium cheese. Fats and oils Soft margarine without trans fats. Vegetable oil. Low-fat, reduced-fat, or light mayonnaise and salad dressings (reduced-sodium). Canola, safflower, olive, soybean, and sunflower oils. Avocado. Seasoning and other foods Herbs. Spices. Seasoning mixes without salt. Unsalted popcorn and pretzels. Fat-free sweets. What foods are not recommended? The items listed may not be a complete list. Talk with your dietitian about what dietary choices are best for you. Grains Baked goods made with fat, such as croissants, muffins, or some breads. Dry pasta or rice meal packs. Vegetables Creamed or fried vegetables. Vegetables in a cheese sauce. Regular canned vegetables (not low-sodium or reduced-sodium). Regular canned tomato sauce and paste (not low-sodium or reduced-sodium). Regular tomato and vegetable juice (not low-sodium or reduced-sodium). Angie Fava. Olives. Fruits Canned fruit in a light or heavy syrup. Fried fruit. Fruit in cream or butter sauce. Meat and other protein  foods Fatty cuts of meat. Ribs. Fried meat. Berniece Salines. Sausage. Bologna and other processed lunch meats. Salami. Fatback. Hotdogs. Bratwurst. Salted nuts and seeds. Canned beans with added salt. Canned or smoked fish. Whole eggs or egg yolks. Chicken or [place] with skin. Dairy Whole or 2% milk, cream, and half-and-half. Whole or full-fat cream cheese. Whole-fat or sweetened yogurt. Full-fat cheese. Nondairy creamers. Whipped toppings. Processed cheese and cheese spreads. Fats and oils Butter. Stick margarine. Lard. Shortening. Ghee. Bacon fat. Tropical oils, such as coconut, palm kernel, or palm oil. Seasoning and other foods Salted popcorn and pretzels. Onion salt, garlic salt, seasoned salt, table salt, and sea salt. Worcestershire sauce. Tartar sauce. Barbecue sauce. Teriyaki sauce. Soy sauce, including reduced-sodium. Steak sauce. Canned and packaged gravies. Fish sauce. Oyster sauce. Cocktail sauce.  Horseradish that you find on the shelf. Ketchup. Mustard. Meat flavorings and tenderizers. Bouillon cubes. Hot sauce and Tabasco sauce. Premade or packaged marinades. Premade or packaged taco seasonings. Relishes. Regular salad dressings. Where to find more information:  National Heart, Lung, and [place]: https://wilson-eaton.com/  American Heart Association: www.heart.org Summary  The DASH eating plan is a healthy eating plan that has been shown to reduce high blood pressure (hypertension). It may also reduce your risk for type 2 diabetes, heart disease, and stroke.  With the DASH eating plan, you should limit salt (sodium) intake to 2,300 mg a day. If you have hypertension, you may need to reduce your sodium intake to 1,500 mg a day.  When on the DASH eating plan, aim to eat more fresh fruits and vegetables, whole grains, lean proteins, low-fat dairy, and heart-healthy fats.  Work with your health care provider or diet and nutrition specialist (dietitian) to adjust your eating plan to your individual calorie needs. This information is not intended to replace advice given to you by your health care provider. Make sure you discuss any questions you have with your health care provider. Document Released: 05/14/2011 Document Revised: 05/07/2017 Document Reviewed: 05/18/2016 Elsevier Patient Education  2020 [employer].

## 2019-03-21 NOTE — Progress Notes (Signed)
Established Patient Office Visit  Subjective:  Patient ID: Caleb Brooks, male    DOB: [birth year]  Age: 58 y.o. MRN: [id number]  CC: No chief complaint on file.   HPI Caleb Brooks presents for c/o fungus to his toe nails that has being going on for more than 1 month. His blood pressure was elevated during visit, and he states that it's usually less than 140/80 when he checks it at home. He continues to smoke 5-6 cigarettes daily and admits the desire to quit. He denies Influenza vaccine and Colonoscopy screening. He reports that he's making some lifestyle changes, doing well and offers no further concerns.  Past Medical History:  Diagnosis Date  . Anxiety   . Arthritis   . Hypertension     Past Surgical History:  Procedure Laterality Date  . CYST REMOVAL TRUNK     Groin area around 2000    Family History  Problem Relation Age of Onset  . Diabetes Father   . Hypertension Father   . Congestive Heart Failure Father   . Hypertension Sister   . CVA Sister   . Bipolar disorder Brother   . Schizophrenia Brother     Social History   Socioeconomic History  . Marital status: Single    Spouse name: Not on file  . Number of children: Not on file  . Years of education: Not on file  . Highest education level: Not on file  Occupational History  . Not on file  Social Needs  . Financial resource strain: Not on file  . Food insecurity    Worry: Not on file    Inability: Not on file  . Transportation needs    Medical: Not on file    Non-medical: Not on file  Tobacco Use  . Smoking status: Current Every Day Smoker    Packs/day: 0.50    Types: Cigarettes  . Smokeless tobacco: Never Used  Substance and Sexual Activity  . Alcohol use: Yes    Comment: 32 oz a day  . Drug use: Yes    Types: Marijuana  . Sexual activity: Not on file  Lifestyle  . Physical activity    Days per week: Not on file    Minutes per session: Not on file  . Stress: Not on file  Relationships  .  Social Herbalist on phone: Not on file    Gets together: Not on file    Attends religious service: Not on file    Active member of club or organization: Not on file    Attends meetings of clubs or organizations: Not on file    Relationship status: Not on file  . Intimate partner violence    Fear of current or ex partner: Not on file    Emotionally abused: Not on file    Physically abused: Not on file    Forced sexual activity: Not on file  Other Topics Concern  . Not on file  Social History Narrative  . Not on file    Outpatient Medications Prior to Visit  Medication Sig Dispense Refill  . Benzoyl Peroxide (BENZOYL PEROXIDE) 10 % LIQD Apply topically.    . celecoxib (CELEBREX) 100 MG capsule TAKE 1 CAPSULE BY MOUTH twice a day as needed 30 capsule 0  . gabapentin (NEURONTIN) 100 MG capsule Take 1 capsule (100 mg total) by mouth 2 (two) times daily. Take 1 capsule (100 mg total) by mouth 2 times daily as needed  60 capsule 1  . NEOMYCIN-POLYMYXIN-HYDROCORTISONE (CORTISPORIN) 1 % SOLN OTIC solution Place 3 drops into the right ear 4 (four) times daily. (Patient not taking: Reported on 03/21/2019) 10 mL 0   No facility-administered medications prior to visit.     Allergies  Allergen Reactions  . Ibuprofen     ROS Review of Systems  Constitutional: Negative.   Eyes: Negative.   Respiratory: Negative.   Cardiovascular: Negative.   Skin:       Nail fungus  Neurological: Negative.   Psychiatric/Behavioral: Negative.       Objective:    Physical Exam  Constitutional: He is oriented to person, place, and time. He appears well-developed and well-nourished.  HENT:  Head: Normocephalic and atraumatic.  Eyes: Pupils are equal, round, and reactive to light. EOM are normal.  Cardiovascular: Normal rate and regular rhythm.  Pulmonary/Chest: Effort normal and breath sounds normal.  Musculoskeletal: Normal range of motion.  Neurological: He is alert and oriented to  person, place, and time.  Skin: Skin is warm.  Fungus to toe nails.  Psychiatric: He has a normal mood and affect. His behavior is normal. Judgment and thought content normal.    BP (!) 142/95 (BP Location: Left Arm, Patient Position: Sitting, Cuff Size: Large)   Pulse 61   Ht 5\' 11"  (1.803 m)   Wt 182 lb (82.6 kg)   BMI 25.38 kg/m  Wt Readings from Last 3 Encounters:  03/21/19 182 lb (82.6 kg)  02/07/19 182 lb (82.6 kg)  09/09/18 185 lb (83.9 kg)     Health Maintenance Due  Topic Date Due  . Hepatitis C Screening  12/09/1960  . HIV Screening  06/13/1977  . TETANUS/TDAP  06/13/1981  . COLONOSCOPY  06/13/2012  . INFLUENZA VACCINE  01/07/2019    There are no preventive care reminders to display for this patient.  Lab Results  Component Value Date   TSH 2.91 01/01/2015   Lab Results  Component Value Date   WBC 4.2 01/25/2019   HGB 15.7 01/25/2019   HCT 46.3 01/25/2019   MCV 92 01/25/2019   PLT 227 01/25/2019   Lab Results  Component Value Date   NA 141 01/25/2019   K 4.2 01/25/2019   CO2 25 01/25/2019   GLUCOSE 83 01/25/2019   BUN 10 01/25/2019   CREATININE 1.08 01/25/2019   BILITOT 0.2 01/25/2019   ALKPHOS 123 (H) 01/25/2019   AST 17 01/25/2019   ALT 10 01/25/2019   PROT 8.0 01/25/2019   ALBUMIN 4.4 01/25/2019   CALCIUM 9.4 01/25/2019   ANIONGAP 9 09/04/2018   Lab Results  Component Value Date   CHOL 141 01/25/2019   Lab Results  Component Value Date   HDL 60 01/25/2019   Lab Results  Component Value Date   LDLCALC 67 01/25/2019   Lab Results  Component Value Date   TRIG 68 01/25/2019   Lab Results  Component Value Date   CHOLHDL 2.4 01/25/2019   Lab Results  Component Value Date   HGBA1C 5.4 01/25/2019      Assessment & Plan:    1. Onychomycosis - He was advised to use Lamisil, educated on medication side effects and to notify clinic. - terbinafine (LAMISIL) 1 % cream; Apply 1 application topically 2 (two) times daily.   Dispense: 30 g; Refill: 0  2. Tobacco abuse - He was encouraged on smoking cessation and was provided with  Quit line information.  3. Essential hypertension - His blood pressure was 161/101  during visit, rechecked and it was 142/95.  He defers treatment, stated that he will continue to modify his diet by making healthy choices, and exercising regularly. He was advised to check blood pressure, record and bring log to visit. - Continue on  DASH diet -Goal blood pressure is less than 140/90 and normal blood pressure is 120/80      Follow-up: Return in about 3 months (around 06/21/2019), or if symptoms worsen or fail to improve.    Daylani Deblois Jerold Coombe, NP

## 2019-04-03 ENCOUNTER — Ambulatory Visit: Payer: Self-pay | Admitting: Pharmacy Technician

## 2019-04-03 DIAGNOSIS — Z79899 Other long term (current) drug therapy: Secondary | ICD-10-CM

## 2019-04-05 ENCOUNTER — Other Ambulatory Visit: Payer: Self-pay

## 2019-04-06 NOTE — Progress Notes (Signed)
Patient still needs to provide notarized letter of support.  [facility]

## 2019-04-20 ENCOUNTER — Other Ambulatory Visit: Payer: Self-pay

## 2019-04-20 ENCOUNTER — Telehealth: Payer: Self-pay | Admitting: Pharmacy Technician

## 2019-04-20 ENCOUNTER — Ambulatory Visit: Payer: Self-pay | Admitting: Pharmacy Technician

## 2019-04-20 DIAGNOSIS — Z79899 Other long term (current) drug therapy: Secondary | ICD-10-CM

## 2019-04-20 NOTE — Progress Notes (Signed)
Completed [facility] application and contract.  Patient agreed to all terms of the [facility] contract.   Patient approved to receive medication assistance at [facility] as long as eligibility criteria continues to be met.    Provided patient with community resource material based on his particular needs.    Caleb Brooks Care Manager [facility]  

## 2019-06-08 ENCOUNTER — Other Ambulatory Visit: Payer: Self-pay

## 2019-06-08 ENCOUNTER — Ambulatory Visit: Payer: Self-pay

## 2019-06-08 DIAGNOSIS — Z79899 Other long term (current) drug therapy: Secondary | ICD-10-CM

## 2019-06-08 NOTE — Progress Notes (Signed)
[facility] Visit Note  Patient: Caleb Brooks MRN: [id number] Date of Birth: 10/07/1960 PCP: Langston Reusing, NP   [name] 58 y.o. male presents for a telephone medication therapy management visit with the pharmacist today.  There were no vitals taken for this visit.  Patient Information   Past Medical History:  Diagnosis Date  . Anxiety   . Arthritis   . Hypertension       Past Surgical History:  Procedure Laterality Date  . CYST REMOVAL TRUNK     Groin area around 2000     Family History  Problem Relation Age of Onset  . Diabetes Father   . Hypertension Father   . Congestive Heart Failure Father   . Hypertension Sister   . CVA Sister   . Bipolar disorder Brother   . Schizophrenia Brother     New Diagnoses (since last visit): None    Social History   Substance and Sexual Activity  Alcohol Use Yes   Comment: 32 oz a day   Reports he is still drinking daily   Social History   Tobacco Use  Smoking Status Current Every Day Smoker  . Packs/day: 0.50  . Types: Cigarettes  Smokeless Tobacco Never Used  Tobacco Comment   5-10 cigarettes a day   Reports currently smoking 5-10 cigarettes per day. He also endorses using marijuana.   Health Maintenance  Topic Date Due  . Hepatitis C Screening  07-Aug-1960  . HIV Screening  06/13/1977  . TETANUS/TDAP  06/13/1981  . COLONOSCOPY  02/19/2020 (Originally 06/13/2012)  . INFLUENZA VACCINE  03/20/2020 (Originally 01/07/2019)   Outpatient Encounter Medications as of 06/08/2019  Medication Sig  . celecoxib (CELEBREX) 100 MG capsule TAKE 1 CAPSULE BY MOUTH twice a day as needed  . gabapentin (NEURONTIN) 100 MG capsule Take 1 capsule (100 mg total) by mouth 2 (two) times daily. Take 1 capsule (100 mg total) by mouth 2 times daily as needed  . Benzoyl Peroxide (BENZOYL PEROXIDE) 10 % LIQD Apply topically.  . terbinafine (LAMISIL) 1 % cream Apply 1 application topically 2 (two) times daily.  (Patient not taking: Reported on 06/08/2019)   No facility-administered encounter medications on file as of 06/08/2019.    Health Maintenance/Date Completed  Last ED visit: 09/04/2018 (right upper extremity cellulitis, d/c with Bactrim and cephalexin)- cellulitis resolved as of today's visit per patient, 09/09/2018 (Returned to ED for 1/4 bottles coag negative staph in blood culture from 09/04/2018 ED visit- determined to be contaminant) Last Visit to PCP: 03/21/2019 Next Visit to PCP: 06/13/2019 Specialist Visit: n/a Dental Exam: Reports last >2 years ago. Patient reports chipped tooth with associated pain Eye Exam: Reports last 3-4 years ago. Wears corrective lenses for reading. Prostate Exam: Unknown, no family hx of prostate cancer per chart. Last PSA in chart 10/26/2013 was 0.4. Colonoscopy: Per last PCP note deferred colonoscopy screening. No hx of colon cancer per chart Flu Vaccine: Elects not to receive Pneumonia Vaccine: Never, would be a candidate for PPSV23 prior to age 61 in view of tobacco and alcohol use  Assessment and Plan:  1. Onychomycosis - Patient was prescribed terbinafine 1% cream and Benzoyl Peroxide 10% liquid but he never received these medications -Patient reports toe nail is un-changed since last PCP visit and wants to receive the topical medications he was prescribed  2. Tobacco abuse - He is currently smoke 5-10 cigarettes daily - Interested in smoking cessation at this time. Previously provided with [place]  Quitline information. He denies previous trial of NRT with gum, lozenges, nicotine patches.   3. Essential hypertension - At last PCP visit his blood pressure was elevated on two separate readings (161/101 and 142/95) -At that time he deferred treatment in favor of lifestyle modifications  4. History of muscle spasm - celecoxib 100 MG capsule twice daily as needed  5. Cyst of perianal area - gabapentin 100 mg twice daily as needed for pain  6.  Adherence -Patient never received topical treatments for fungal toe nail infection -He is taking other prescription medications as needed  [place] Resident 08 June 2019

## 2019-06-13 ENCOUNTER — Other Ambulatory Visit: Payer: Self-pay

## 2019-06-13 ENCOUNTER — Ambulatory Visit: Payer: Self-pay | Admitting: Gerontology

## 2019-06-13 DIAGNOSIS — Z720 Tobacco use: Secondary | ICD-10-CM

## 2019-06-13 DIAGNOSIS — B351 Tinea unguium: Secondary | ICD-10-CM

## 2019-06-13 DIAGNOSIS — R030 Elevated blood-pressure reading, without diagnosis of hypertension: Secondary | ICD-10-CM

## 2019-06-13 DIAGNOSIS — K6289 Other specified diseases of anus and rectum: Secondary | ICD-10-CM

## 2019-06-13 MED ORDER — BENZOYL PEROXIDE WASH 10 % EX LIQD: 148.0000 mL | Freq: Every day | CUTANEOUS | 0 refills | Status: DC

## 2019-06-13 NOTE — Patient Instructions (Signed)
DASH Eating Plan DASH stands for "Dietary Approaches to Stop Hypertension." The DASH eating plan is a healthy eating plan that has been shown to reduce high blood pressure (hypertension). It may also reduce your risk for type 2 diabetes, heart disease, and stroke. The DASH eating plan may also help with weight loss. What are tips for following this plan?  General guidelines  Avoid eating more than 2,300 mg (milligrams) of salt (sodium) a day. If you have hypertension, you may need to reduce your sodium intake to 1,500 mg a day.  Limit alcohol intake to no more than 1 drink a day for nonpregnant women and 2 drinks a day for men. One drink equals 12 oz of beer, 5 oz of wine, or 1 oz of hard liquor.  Work with your health care provider to maintain a healthy body weight or to lose weight. Ask what an ideal weight is for you.  Get at least 30 minutes of exercise that causes your heart to beat faster (aerobic exercise) most days of the week. Activities may include walking, swimming, or biking.  Work with your health care provider or diet and nutrition specialist (dietitian) to adjust your eating plan to your individual calorie needs. Reading food labels   Check food labels for the amount of sodium per serving. Choose foods with less than 5 percent of the Daily Value of sodium. Generally, foods with less than 300 mg of sodium per serving fit into this eating plan.  To find whole grains, look for the word "whole" as the first word in the ingredient list. Shopping  Buy products labeled as "low-sodium" or "no salt added."  Buy fresh foods. Avoid canned foods and premade or frozen meals. Cooking  Avoid adding salt when cooking. Use salt-free seasonings or herbs instead of table salt or sea salt. Check with your health care provider or pharmacist before using salt substitutes.  Do not fry foods. Cook foods using healthy methods such as baking, boiling, grilling, and broiling instead.  Cook with  heart-healthy oils, such as olive, canola, soybean, or sunflower oil. Meal planning  Eat a balanced diet that includes: ? 5 or more servings of fruits and vegetables each day. At each meal, try to fill half of your plate with fruits and vegetables. ? Up to 6-8 servings of whole grains each day. ? Less than 6 oz of lean meat, poultry, or fish each day. A 3-oz serving of meat is about the same size as a deck of cards. One egg equals 1 oz. ? 2 servings of low-fat dairy each day. ? A serving of nuts, seeds, or beans 5 times each week. ? Heart-healthy fats. Healthy fats called Omega-3 fatty acids are found in foods such as flaxseeds and coldwater fish, like sardines, salmon, and mackerel.  Limit how much you eat of the following: ? Canned or prepackaged foods. ? Food that is high in trans fat, such as fried foods. ? Food that is high in saturated fat, such as fatty meat. ? Sweets, desserts, sugary drinks, and other foods with added sugar. ? Full-fat dairy products.  Do not salt foods before eating.  Try to eat at least 2 vegetarian meals each week.  Eat more home-cooked food and less restaurant, buffet, and fast food.  When eating at a restaurant, ask that your food be prepared with less salt or no salt, if possible. What foods are recommended? The items listed may not be a complete list. Talk with your dietitian about   what dietary choices are best for you. Grains Whole-grain or whole-wheat bread. Whole-grain or whole-wheat pasta. Brown rice. Oatmeal. Quinoa. Bulgur. Whole-grain and low-sodium cereals. Pita bread. Low-fat, low-sodium crackers. Whole-wheat flour tortillas. Vegetables Fresh or frozen vegetables (raw, steamed, roasted, or grilled). Low-sodium or reduced-sodium tomato and vegetable juice. Low-sodium or reduced-sodium tomato sauce and tomato paste. Low-sodium or reduced-sodium canned vegetables. Fruits All fresh, dried, or frozen fruit. Canned fruit in natural juice (without  added sugar). Meat and other protein foods Skinless chicken or turkey. Ground chicken or turkey. Pork with fat trimmed off. Fish and seafood. Egg whites. Dried beans, peas, or lentils. Unsalted nuts, nut butters, and seeds. Unsalted canned beans. Lean cuts of beef with fat trimmed off. Low-sodium, lean deli meat. Dairy Low-fat (1%) or fat-free (skim) milk. Fat-free, low-fat, or reduced-fat cheeses. Nonfat, low-sodium ricotta or cottage cheese. Low-fat or nonfat yogurt. Low-fat, low-sodium cheese. Fats and oils Soft margarine without trans fats. Vegetable oil. Low-fat, reduced-fat, or light mayonnaise and salad dressings (reduced-sodium). Canola, safflower, olive, soybean, and sunflower oils. Avocado. Seasoning and other foods Herbs. Spices. Seasoning mixes without salt. Unsalted popcorn and pretzels. Fat-free sweets. What foods are not recommended? The items listed may not be a complete list. Talk with your dietitian about what dietary choices are best for you. Grains Baked goods made with fat, such as croissants, muffins, or some breads. Dry pasta or rice meal packs. Vegetables Creamed or fried vegetables. Vegetables in a cheese sauce. Regular canned vegetables (not low-sodium or reduced-sodium). Regular canned tomato sauce and paste (not low-sodium or reduced-sodium). Regular tomato and vegetable juice (not low-sodium or reduced-sodium). Pickles. Olives. Fruits Canned fruit in a light or heavy syrup. Fried fruit. Fruit in cream or butter sauce. Meat and other protein foods Fatty cuts of meat. Ribs. Fried meat. Bacon. Sausage. Bologna and other processed lunch meats. Salami. Fatback. Hotdogs. Bratwurst. Salted nuts and seeds. Canned beans with added salt. Canned or smoked fish. Whole eggs or egg yolks. Chicken or turkey with skin. Dairy Whole or 2% milk, cream, and half-and-half. Whole or full-fat cream cheese. Whole-fat or sweetened yogurt. Full-fat cheese. Nondairy creamers. Whipped toppings.  Processed cheese and cheese spreads. Fats and oils Butter. Stick margarine. Lard. Shortening. Ghee. Bacon fat. Tropical oils, such as coconut, palm kernel, or palm oil. Seasoning and other foods Salted popcorn and pretzels. Onion salt, garlic salt, seasoned salt, table salt, and sea salt. Worcestershire sauce. Tartar sauce. Barbecue sauce. Teriyaki sauce. Soy sauce, including reduced-sodium. Steak sauce. Canned and packaged gravies. Fish sauce. Oyster sauce. Cocktail sauce. Horseradish that you find on the shelf. Ketchup. Mustard. Meat flavorings and tenderizers. Bouillon cubes. Hot sauce and Tabasco sauce. Premade or packaged marinades. Premade or packaged taco seasonings. Relishes. Regular salad dressings. Where to find more information:  National Heart, Lung, and Blood Institute: www.nhlbi.nih.gov  American Heart Association: www.heart.org Summary  The DASH eating plan is a healthy eating plan that has been shown to reduce high blood pressure (hypertension). It may also reduce your risk for type 2 diabetes, heart disease, and stroke.  With the DASH eating plan, you should limit salt (sodium) intake to 2,300 mg a day. If you have hypertension, you may need to reduce your sodium intake to 1,500 mg a day.  When on the DASH eating plan, aim to eat more fresh fruits and vegetables, whole grains, lean proteins, low-fat dairy, and heart-healthy fats.  Work with your health care provider or diet and nutrition specialist (dietitian) to adjust your eating plan to your   individual calorie needs. This information is not intended to replace advice given to you by your health care provider. Make sure you discuss any questions you have with your health care provider. Document Revised: 05/07/2017 Document Reviewed: 05/18/2016 Elsevier Patient Education  2020 [employer]  

## 2019-06-13 NOTE — Progress Notes (Signed)
Established Patient Office Visit  Subjective:  Patient ID: Caleb Brooks, male    DOB: Oct 19, 1960  Age: 59 y.o. MRN: [id number]  CC: No chief complaint on file. Patient consents to telephone visit and 2 patients identifiers was used to identify patient.  HPI Caleb Brooks presents for follow up of onychomycosis to toe nails, hypertension and smoking. Per pharmacy note on 06/08/2019, he states that he has not picked upTerbinafine for onychomycosis to his toe nails and he denies any improvement since his last visit. His blood pressure was elevated during his last visit and he declined taking any antihypertensive medication and he states that has not being checking his blood pressure. He continues to smoke 5-10 cigarette daily and admits the desire to quit. He states that he has a history of recurrent cysts to perianal area . He reports of having eruption of multiple cyst to his perianal area 2-3 months ago and using10% benzoyl peroxide daily to shower relieves symptoms. He states that per Dermatology, the cysts are treatable but it reoccurs. He deferred any Dermatology referral. He denies chest pain, palpitation, dizziness, fever and chills. He states that he's doing well and offers no further complaints.  Past Medical History:  Diagnosis Date  . Anxiety   . Arthritis   . Hypertension     Past Surgical History:  Procedure Laterality Date  . CYST REMOVAL TRUNK     Groin area around 2000    Family History  Problem Relation Age of Onset  . Diabetes Father   . Hypertension Father   . Congestive Heart Failure Father   . Hypertension Sister   . CVA Sister   . Bipolar disorder Brother   . Schizophrenia Brother     Social History   Socioeconomic History  . Marital status: Single    Spouse name: Not on file  . Number of children: Not on file  . Years of education: Not on file  . Highest education level: Not on file  Occupational History  . Not on file  Tobacco Use  . Smoking  status: Current Every Day Smoker    Packs/day: 0.50    Types: Cigarettes  . Smokeless tobacco: Never Used  . Tobacco comment: 5-10 cigarettes a day  Substance and Sexual Activity  . Alcohol use: Yes    Comment: 32 oz a day  . Drug use: Yes    Types: Marijuana  . Sexual activity: Not on file  Other Topics Concern  . Not on file  Social History Narrative  . Not on file   Social Determinants of Health   Financial Resource Strain:   . Difficulty of Paying Living Expenses: Not on file  Food Insecurity:   . Worried About Charity fundraiser in the Last Year: Not on file  . Ran Out of Food in the Last Year: Not on file  Transportation Needs:   . Lack of Transportation (Medical): Not on file  . Lack of Transportation (Non-Medical): Not on file  Physical Activity:   . Days of Exercise per Week: Not on file  . Minutes of Exercise per Session: Not on file  Stress:   . Feeling of Stress : Not on file  Social Connections:   . Frequency of Communication with Friends and Family: Not on file  . Frequency of Social Gatherings with Friends and Family: Not on file  . Attends Religious Services: Not on file  . Active Member of Clubs or Organizations: Not on file  .  Attends Archivist Meetings: Not on file  . Marital Status: Not on file  Intimate Partner Violence:   . Fear of Current or Ex-Partner: Not on file  . Emotionally Abused: Not on file  . Physically Abused: Not on file  . Sexually Abused: Not on file    Outpatient Medications Prior to Visit  Medication Sig Dispense Refill  . celecoxib (CELEBREX) 100 MG capsule TAKE 1 CAPSULE BY MOUTH twice a day as needed 30 capsule 0  . gabapentin (NEURONTIN) 100 MG capsule Take 1 capsule (100 mg total) by mouth 2 (two) times daily. Take 1 capsule (100 mg total) by mouth 2 times daily as needed 60 capsule 1  . terbinafine (LAMISIL) 1 % cream Apply 1 application topically 2 (two) times daily. (Patient not taking: Reported on 06/08/2019)  30 g 0  . Benzoyl Peroxide (BENZOYL PEROXIDE) 10 % LIQD Apply topically.     No facility-administered medications prior to visit.    Allergies  Allergen Reactions  . Ibuprofen Other (See Comments)    Stomach upset    ROS Review of Systems  Constitutional: Negative.   Eyes: Negative.   Respiratory: Negative.   Cardiovascular: Negative.   Skin: Negative.   Neurological: Negative.   Psychiatric/Behavioral: Negative.       Objective:    Physical Exam No physical exam was done There were no vitals taken for this visit. Wt Readings from Last 3 Encounters:  03/21/19 182 lb (82.6 kg)  02/07/19 182 lb (82.6 kg)  09/09/18 185 lb (83.9 kg)     Health Maintenance Due  Topic Date Due  . Hepatitis C Screening  1960/08/02  . HIV Screening  06/13/1977  . TETANUS/TDAP  06/13/1981    There are no preventive care reminders to display for this patient.  Lab Results  Component Value Date   TSH 2.91 01/01/2015   Lab Results  Component Value Date   WBC 4.2 01/25/2019   HGB 15.7 01/25/2019   HCT 46.3 01/25/2019   MCV 92 01/25/2019   PLT 227 01/25/2019   Lab Results  Component Value Date   NA 141 01/25/2019   K 4.2 01/25/2019   CO2 25 01/25/2019   GLUCOSE 83 01/25/2019   BUN 10 01/25/2019   CREATININE 1.08 01/25/2019   BILITOT 0.2 01/25/2019   ALKPHOS 123 (H) 01/25/2019   AST 17 01/25/2019   ALT 10 01/25/2019   PROT 8.0 01/25/2019   ALBUMIN 4.4 01/25/2019   CALCIUM 9.4 01/25/2019   ANIONGAP 9 09/04/2018   Lab Results  Component Value Date   CHOL 141 01/25/2019   Lab Results  Component Value Date   HDL 60 01/25/2019   Lab Results  Component Value Date   LDLCALC 67 01/25/2019   Lab Results  Component Value Date   TRIG 68 01/25/2019   Lab Results  Component Value Date   CHOLHDL 2.4 01/25/2019   Lab Results  Component Value Date   HGBA1C 5.4 01/25/2019      Assessment & Plan:    1. Cyst of perianal area - He deferred Dermatology referral and  was advised to use Benzoyl peroxide. He was advised to notify clinic for worsening symptoms. - Benzoyl Peroxide (BENZOYL PEROXIDE) 10 % LIQD; Apply 148 mLs topically daily.  Dispense: 1 g; Refill: 0  2. Onychomycosis - He was advised to pick up [facility]fine [facility]. He was educated on the side effect and to notify clinic.  3. Elevated blood pressure reading -  He was advised to check, record blood pressure daily and bring log to follow up appointment. He was advised to continue on DASH diet and exercise as tolerated.  4. Tobacco abuse - He declines referral to [facility] and he was provided with [place] quit line information. - He was advised on smoking cessation.    Follow-up: Return in about 6 weeks (around 07/26/2019), or if symptoms worsen or fail to improve.    N[facility] Bolls Jerold Coombe, NP

## 2019-07-21 ENCOUNTER — Other Ambulatory Visit: Payer: Self-pay

## 2019-07-21 ENCOUNTER — Ambulatory Visit: Payer: Self-pay | Attending: Internal Medicine

## 2019-07-21 DIAGNOSIS — Z20822 Contact with and (suspected) exposure to covid-19: Secondary | ICD-10-CM | POA: Insufficient documentation

## 2019-07-22 LAB — NOVEL CORONAVIRUS, NAA: SARS-CoV-2, NAA: NOT DETECTED

## 2019-07-26 ENCOUNTER — Observation Stay: Payer: Self-pay | Admitting: Anesthesiology

## 2019-07-26 ENCOUNTER — Encounter: Payer: Self-pay | Admitting: Gerontology

## 2019-07-26 ENCOUNTER — Encounter: Payer: Self-pay | Admitting: Emergency Medicine

## 2019-07-26 ENCOUNTER — Other Ambulatory Visit: Payer: Self-pay

## 2019-07-26 ENCOUNTER — Ambulatory Visit: Payer: Self-pay | Admitting: Gerontology

## 2019-07-26 ENCOUNTER — Encounter
Admission: EM | Disposition: A | Discharge status: Home / Self Care | Payer: Self-pay | Source: Home / Self Care | Attending: Emergency Medicine

## 2019-07-26 ENCOUNTER — Emergency Department: Payer: Self-pay

## 2019-07-26 ENCOUNTER — Observation Stay
Admission: EM | Admit: 2019-07-26 | Discharge: 2019-07-27 | Disposition: A | Discharge status: Home / Self Care | Payer: Self-pay | Attending: Urology | Admitting: Urology

## 2019-07-26 VITALS — BP 142/85 | HR 74 | Temp 97.0°F | Ht 71.0 in | Wt 194.5 lb

## 2019-07-26 DIAGNOSIS — N492 Inflammatory disorders of scrotum: Principal | ICD-10-CM | POA: Diagnosis present

## 2019-07-26 DIAGNOSIS — L02215 Cutaneous abscess of perineum: Secondary | ICD-10-CM | POA: Insufficient documentation

## 2019-07-26 DIAGNOSIS — M1990 Unspecified osteoarthritis, unspecified site: Secondary | ICD-10-CM | POA: Insufficient documentation

## 2019-07-26 DIAGNOSIS — Z79899 Other long term (current) drug therapy: Secondary | ICD-10-CM | POA: Insufficient documentation

## 2019-07-26 DIAGNOSIS — F419 Anxiety disorder, unspecified: Secondary | ICD-10-CM | POA: Insufficient documentation

## 2019-07-26 DIAGNOSIS — K6289 Other specified diseases of anus and rectum: Secondary | ICD-10-CM

## 2019-07-26 DIAGNOSIS — I10 Essential (primary) hypertension: Secondary | ICD-10-CM | POA: Insufficient documentation

## 2019-07-26 DIAGNOSIS — F17210 Nicotine dependence, cigarettes, uncomplicated: Secondary | ICD-10-CM | POA: Insufficient documentation

## 2019-07-26 DIAGNOSIS — Z20822 Contact with and (suspected) exposure to covid-19: Secondary | ICD-10-CM | POA: Insufficient documentation

## 2019-07-26 HISTORY — PX: INCISION AND DRAINAGE ABSCESS: SHX5864

## 2019-07-26 LAB — COMPREHENSIVE METABOLIC PANEL
ALT: 13 U/L (ref 0–44)
AST: 17 U/L (ref 15–41)
Albumin: 3.6 g/dL (ref 3.5–5.0)
Alkaline Phosphatase: 102 U/L (ref 38–126)
Anion gap: 9 (ref 5–15)
BUN: 11 mg/dL (ref 6–20)
CO2: 26 mmol/L (ref 22–32)
Calcium: 8.8 mg/dL — ABNORMAL LOW (ref 8.9–10.3)
Chloride: 103 mmol/L (ref 98–111)
Creatinine, Ser: 0.93 mg/dL (ref 0.61–1.24)
GFR calc Af Amer: >60 mL/min (ref >60)
GFR calc non Af Amer: >60 mL/min (ref >60)
Glucose, Bld: 101 mg/dL — ABNORMAL HIGH (ref 70–99)
Potassium: 3.6 mmol/L (ref 3.5–5.1)
Sodium: 138 mmol/L (ref 135–145)
Total Bilirubin: 0.8 mg/dL (ref 0.3–1.2)
Total Protein: 7.9 g/dL (ref 6.5–8.1)

## 2019-07-26 LAB — RESPIRATORY PANEL BY RT PCR (FLU A&B, COVID)
Influenza A by PCR: NEGATIVE
Influenza B by PCR: NEGATIVE
SARS Coronavirus 2 by RT PCR: NEGATIVE

## 2019-07-26 LAB — CBC
HCT: 45.4 % (ref 39.0–52.0)
Hemoglobin: 15 g/dL (ref 13.0–17.0)
MCH: 31.1 pg (ref 26.0–34.0)
MCHC: 33 g/dL (ref 30.0–36.0)
MCV: 94.2 fL (ref 80.0–100.0)
Platelets: 228 10*3/uL (ref 150–400)
RBC: 4.82 MIL/uL (ref 4.22–5.81)
RDW: 12.6 % (ref 11.5–15.5)
WBC: 8.8 10*3/uL (ref 4.0–10.5)
nRBC: 0 % (ref 0.0–0.2)

## 2019-07-26 SURGERY — INCISION AND DRAINAGE, ABSCESS
Anesthesia: General | Site: Scrotum

## 2019-07-26 MED ORDER — LACTATED RINGERS IV SOLN: INTRAVENOUS | Status: DC | PRN

## 2019-07-26 MED ORDER — SODIUM CHLORIDE 0.9 % IV BOLUS
1000.0000 mL | Freq: Once | INTRAVENOUS | Status: AC
  Administered 2019-07-26: 16:00:00 1000 mL via INTRAVENOUS

## 2019-07-26 MED ORDER — FENTANYL CITRATE (PF) 100 MCG/2ML IJ SOLN
INTRAMUSCULAR | Status: DC | PRN
  Administered 2019-07-26 (×3): 50 ug via INTRAVENOUS

## 2019-07-26 MED ORDER — MORPHINE SULFATE (PF) 4 MG/ML IV SOLN
4.0000 mg | Freq: Once | INTRAVENOUS | Status: AC
  Administered 2019-07-26: 16:00:00 4 mg via INTRAVENOUS
  Filled 2019-07-26: qty 1

## 2019-07-26 MED ORDER — DEXAMETHASONE SODIUM PHOSPHATE 10 MG/ML IJ SOLN
INTRAMUSCULAR | Status: DC | PRN
  Administered 2019-07-26: 10 mg via INTRAVENOUS

## 2019-07-26 MED ORDER — PIPERACILLIN-TAZOBACTAM 3.375 G IVPB 30 MIN
3.3750 g | Freq: Once | INTRAVENOUS | Status: AC
  Administered 2019-07-26: 3.375 g via INTRAVENOUS
  Filled 2019-07-26: qty 50

## 2019-07-26 MED ORDER — BUPIVACAINE HCL 0.5 % IJ SOLN
INTRAMUSCULAR | Status: DC | PRN
  Administered 2019-07-26: 5 mL

## 2019-07-26 MED ORDER — OXYCODONE HCL 5 MG/5ML PO SOLN: 5.0000 mg | Freq: Once | ORAL | Status: DC | PRN

## 2019-07-26 MED ORDER — SUCCINYLCHOLINE CHLORIDE 20 MG/ML IJ SOLN
INTRAMUSCULAR | Status: DC | PRN
  Administered 2019-07-26: 160 mg via INTRAVENOUS

## 2019-07-26 MED ORDER — EPINEPHRINE PF 1 MG/ML IJ SOLN
INTRAMUSCULAR | Status: AC
  Filled 2019-07-26: qty 1

## 2019-07-26 MED ORDER — BUPIVACAINE HCL (PF) 0.5 % IJ SOLN
INTRAMUSCULAR | Status: AC
  Filled 2019-07-26: qty 30

## 2019-07-26 MED ORDER — FENTANYL CITRATE (PF) 100 MCG/2ML IJ SOLN
25.0000 ug | INTRAMUSCULAR | Status: AC | PRN
  Administered 2019-07-26 (×4): 25 ug via INTRAVENOUS

## 2019-07-26 MED ORDER — PROPOFOL 10 MG/ML IV BOLUS
INTRAVENOUS | Status: AC
  Filled 2019-07-26: qty 20

## 2019-07-26 MED ORDER — BUPIVACAINE HCL (PF) 0.25 % IJ SOLN
INTRAMUSCULAR | Status: AC
  Filled 2019-07-26: qty 30

## 2019-07-26 MED ORDER — PROPOFOL 10 MG/ML IV BOLUS
INTRAVENOUS | Status: DC | PRN
  Administered 2019-07-26: 150 mg via INTRAVENOUS

## 2019-07-26 MED ORDER — HYDROMORPHONE HCL 1 MG/ML IJ SOLN
1.0000 mg | INTRAMUSCULAR | Status: AC | PRN
  Administered 2019-07-26 – 2019-07-27 (×3): 1 mg via INTRAVENOUS
  Filled 2019-07-26 (×3): qty 1

## 2019-07-26 MED ORDER — FENTANYL CITRATE (PF) 100 MCG/2ML IJ SOLN
INTRAMUSCULAR | Status: AC
  Administered 2019-07-26: 22:00:00 25 ug via INTRAVENOUS
  Filled 2019-07-26: qty 2

## 2019-07-26 MED ORDER — FENTANYL CITRATE (PF) 100 MCG/2ML IJ SOLN
INTRAMUSCULAR | Status: AC
  Administered 2019-07-26: 25 ug via INTRAVENOUS
  Filled 2019-07-26: qty 2

## 2019-07-26 MED ORDER — FENTANYL CITRATE (PF) 250 MCG/5ML IJ SOLN
INTRAMUSCULAR | Status: AC
  Filled 2019-07-26: qty 5

## 2019-07-26 MED ORDER — DAKINS (1/4 STRENGTH) 0.125 % EX SOLN
CUTANEOUS | Status: AC
  Filled 2019-07-26: qty 473

## 2019-07-26 MED ORDER — MIDAZOLAM HCL 2 MG/2ML IJ SOLN
INTRAMUSCULAR | Status: AC
  Filled 2019-07-26: qty 2

## 2019-07-26 MED ORDER — HYDROMORPHONE HCL 1 MG/ML IJ SOLN: 0.2500 mg | INTRAMUSCULAR | Status: DC | PRN

## 2019-07-26 MED ORDER — MIDAZOLAM HCL 2 MG/2ML IJ SOLN
INTRAMUSCULAR | Status: DC | PRN
  Administered 2019-07-26: 2 mg via INTRAVENOUS

## 2019-07-26 MED ORDER — IPRATROPIUM-ALBUTEROL 0.5-2.5 (3) MG/3ML IN SOLN
RESPIRATORY_TRACT | Status: AC
  Filled 2019-07-26: qty 3

## 2019-07-26 MED ORDER — OXYCODONE HCL 5 MG PO TABS: 5.0000 mg | ORAL_TABLET | Freq: Once | ORAL | Status: DC | PRN

## 2019-07-26 MED ORDER — ONDANSETRON HCL 4 MG/2ML IJ SOLN
4.0000 mg | Freq: Once | INTRAMUSCULAR | Status: AC
  Administered 2019-07-26: 4 mg via INTRAVENOUS
  Filled 2019-07-26: qty 2

## 2019-07-26 MED ORDER — IOHEXOL 300 MG/ML  SOLN
100.0000 mL | Freq: Once | INTRAMUSCULAR | Status: AC | PRN
  Administered 2019-07-26: 17:00:00 100 mL via INTRAVENOUS
  Filled 2019-07-26: qty 100

## 2019-07-26 MED ORDER — LIDOCAINE HCL 1 % IJ SOLN
INTRAMUSCULAR | Status: DC | PRN
  Administered 2019-07-26: 5 mL

## 2019-07-26 MED ORDER — CLINDAMYCIN PHOSPHATE 600 MG/50ML IV SOLN
600.0000 mg | Freq: Once | INTRAVENOUS | Status: AC
  Administered 2019-07-26: 16:00:00 600 mg via INTRAVENOUS
  Filled 2019-07-26: qty 50

## 2019-07-26 MED ORDER — IPRATROPIUM-ALBUTEROL 0.5-2.5 (3) MG/3ML IN SOLN
3.0000 mL | Freq: Once | RESPIRATORY_TRACT | Status: AC
  Administered 2019-07-26: 21:00:00 3 mL via RESPIRATORY_TRACT

## 2019-07-26 MED ORDER — LIDOCAINE HCL (PF) 1 % IJ SOLN
INTRAMUSCULAR | Status: AC
  Filled 2019-07-26: qty 30

## 2019-07-26 SURGICAL SUPPLY — 50 items
BLADE SURG 15 STRL LF DISP TIS (BLADE) ×4 IMPLANT
BLADE SURG 15 STRL SS (BLADE) ×4
BNDG CONFORM 2 STRL LF (GAUZE/BANDAGES/DRESSINGS) IMPLANT
CANISTER SUCT 1200ML W/VALVE (MISCELLANEOUS) ×4 IMPLANT
CHLORAPREP W/TINT 26 (MISCELLANEOUS) ×8 IMPLANT
COVER WAND RF STERILE (DRAPES) IMPLANT
DERMABOND ADVANCED (GAUZE/BANDAGES/DRESSINGS)
DERMABOND ADVANCED .7 DNX12 (GAUZE/BANDAGES/DRESSINGS) IMPLANT
DRAIN PENROSE 1/4X12 LTX STRL (WOUND CARE) ×4 IMPLANT
DRAPE LAPAROTOMY 100X77 ABD (DRAPES) ×4 IMPLANT
DRAPE LAPAROTOMY 77X122 PED (DRAPES) IMPLANT
DRAPE PERI LITHO V/GYN (MISCELLANEOUS) ×4 IMPLANT
DRSG GAUZE FLUFF 36X18 (GAUZE/BANDAGES/DRESSINGS) IMPLANT
ELECT REM PT RETURN 9FT ADLT (ELECTROSURGICAL) ×4
ELECTRODE REM PT RTRN 9FT ADLT (ELECTROSURGICAL) ×2 IMPLANT
GAUZE PACKING IODOFORM 1/2 (PACKING) IMPLANT
GAUZE SPONGE 4X4 12PLY STRL (GAUZE/BANDAGES/DRESSINGS) ×4 IMPLANT
GLOVE BIO SURGEON STRL SZ 6.5 (GLOVE) ×3 IMPLANT
GLOVE BIO SURGEON STRL SZ7 (GLOVE) ×4 IMPLANT
GLOVE BIO SURGEONS STRL SZ 6.5 (GLOVE) ×1
GLOVE BIOGEL PI IND STRL 7.0 (GLOVE) ×4 IMPLANT
GLOVE BIOGEL PI INDICATOR 7.0 (GLOVE) ×4
GLOVE SURG SYN 6.5 ES PF (GLOVE) ×8 IMPLANT
GOWN STRL REUS W/ TWL LRG LVL3 (GOWN DISPOSABLE) ×6 IMPLANT
GOWN STRL REUS W/TWL LRG LVL3 (GOWN DISPOSABLE) ×6
KIT TURNOVER KIT A (KITS) ×4 IMPLANT
LABEL OR SOLS (LABEL) ×4 IMPLANT
NEEDLE HYPO 22GX1.5 SAFETY (NEEDLE) ×4 IMPLANT
NEEDLE HYPO 25X1 1.5 SAFETY (NEEDLE) ×4 IMPLANT
NS IRRIG 500ML POUR BTL (IV SOLUTION) ×4 IMPLANT
PACK BASIN MINOR ARMC (MISCELLANEOUS) ×4 IMPLANT
PULSAVAC PLUS IRRIG FAN TIP (DISPOSABLE) ×4
SOL PREP PVP 2OZ (MISCELLANEOUS) ×4
SOLUTION PREP PVP 2OZ (MISCELLANEOUS) ×2 IMPLANT
SUPPORETR ATHLETIC LG (MISCELLANEOUS) IMPLANT
SUPPORTER ATHLETIC LG (MISCELLANEOUS)
SUT ETHILON 3-0 FS-10 30 BLK (SUTURE)
SUT MNCRL 4-0 (SUTURE)
SUT MNCRL 4-0 27XMFL (SUTURE)
SUT SILK 3-0 (SUTURE) ×4 IMPLANT
SUT VIC AB 2-0 CT2 27 (SUTURE) IMPLANT
SUT VIC AB 3-0 SH 27 (SUTURE)
SUT VIC AB 3-0 SH 27X BRD (SUTURE) IMPLANT
SUT VIC AB 4-0 SH 27 (SUTURE)
SUT VIC AB 4-0 SH 27XANBCTRL (SUTURE) IMPLANT
SUTURE EHLN 3-0 FS-10 30 BLK (SUTURE) IMPLANT
SUTURE MNCRL 4-0 27XMF (SUTURE) IMPLANT
SYR 10ML LL (SYRINGE) ×8 IMPLANT
TIP FAN IRRIG PULSAVAC PLUS (DISPOSABLE) ×2 IMPLANT
TOWEL OR 17X26 4PK STRL BLUE (TOWEL DISPOSABLE) ×4 IMPLANT

## 2019-07-26 NOTE — ED Notes (Signed)
Reference triage note. Pt states he noticed abscess 2 days ago. Pt states affected area is more painful with sitting. Pt states he has had abscess before in same area that ended up having to be drained.

## 2019-07-26 NOTE — ED Provider Notes (Signed)
[facility] Emergency Department Provider Note  ____________________________________________  Time seen: Approximately 3:45 PM  I have reviewed the triage vital signs and the nursing notes.   HISTORY  Chief Complaint Abscess    HPI Caleb Brooks is a 59 y.o. male who presents the emergency department for evaluation of cellulitis/abscess/boil to the right scrotum extending into the perineal region.  Patient reports that he first noticed size 2 days ago.  He states that it first around the scrotum, has rapidly extended into the perineal region.  Patient states that the pain is excruciating, patient is unable to sit down given the pain.  Patient may lay on the left side, or otherwise must remain standing.  He denies any fevers or chills.  No abdominal pain.  No dysuria, polyuria, hematuria.  No penile drainage.  Patient states that he does have a history of recurrent cellulitis but last treated abscess was approximately 3 years ago.  Patient denies any other complaints at this time.         Past Medical History:  Diagnosis Date  . Anxiety   . Arthritis   . Hypertension     Patient Active Problem List   Diagnosis Date Noted  . Cyst of perianal area 07/26/2019  . Onychomycosis 03/21/2019  . Callus of foot 02/07/2019  . Nail thickening 02/07/2019  . Encounter to establish care 02/07/2019  . Right middle ear infection 02/07/2019  . Right otitis media 02/07/2019  . Elevated blood pressure reading 02/07/2019  . Tobacco abuse 01/01/2015  . ETOH abuse 01/01/2015  . Arthritis 10/02/2014  . Hypertension 08/02/2014    Past Surgical History:  Procedure Laterality Date  . CYST REMOVAL TRUNK     Groin area around 2000    Prior to Admission medications   Medication Sig Start Date End Date Taking? Authorizing Provider  Benzoyl Peroxide (BENZOYL PEROXIDE) 10 % LIQD Apply 148 mLs topically daily. Patient not taking: Reported on 07/26/2019 06/13/19   Iloabachie,  Chioma E, NP  celecoxib (CELEBREX) 100 MG capsule TAKE 1 CAPSULE BY MOUTH twice a day as needed Patient taking differently: Take 100 mg by mouth 2 (two) times daily. TAKE 1 CAPSULE BY MOUTH twice a day as needed 02/07/19   Iloabachie, Chioma E, NP  gabapentin (NEURONTIN) 100 MG capsule Take 1 capsule (100 mg total) by mouth 2 (two) times daily. Take 1 capsule (100 mg total) by mouth 2 times daily as needed 02/07/19   Iloabachie, Chioma E, NP  terbinafine (LAMISIL) 1 % cream Apply 1 application topically 2 (two) times daily. 03/21/19   Iloabachie, Chioma E, NP    Allergies Ibuprofen  Family History  Problem Relation Age of Onset  . Diabetes Father   . Hypertension Father   . Congestive Heart Failure Father   . Hypertension Sister   . CVA Sister   . Bipolar disorder Brother   . Schizophrenia Brother     Social History Social History   Tobacco Use  . Smoking status: Current Every Day Smoker    Packs/day: 0.50    Types: Cigarettes  . Smokeless tobacco: Never Used  . Tobacco comment: 5-10 cigarettes a day  Substance Use Topics  . Alcohol use: Yes    Comment: 32 oz a day  . Drug use: Yes    Types: Marijuana     Review of Systems  Constitutional: No fever/chills Eyes: No visual changes. No discharge ENT: No upper respiratory complaints. Cardiovascular: no chest pain. Respiratory: no cough.  No SOB. Gastrointestinal: No abdominal pain.  No nausea, no vomiting.  No diarrhea.  No constipation. Genitourinary: Negative for dysuria. No hematuria.  Possible scrotal abscess Musculoskeletal: Negative for musculoskeletal pain. Skin: Negative for rash, abrasions, lacerations, ecchymosis. Neurological: Negative for headaches, focal weakness or numbness. 10-point ROS otherwise negative.  ____________________________________________   PHYSICAL EXAM:  VITAL SIGNS: ED Triage Vitals  Enc Vitals Group     BP 07/26/19 1403 (!) 144/92     Pulse Rate 07/26/19 1403 72     Resp 07/26/19  1403 20     Temp 07/26/19 1403 98.6 F (37 C)     Temp Source 07/26/19 1403 Oral     SpO2 07/26/19 1403 97 %     Weight 07/26/19 1341 178 lb (80.7 kg)     Height 07/26/19 1341 5\' 11"  (1.803 m)     Head Circumference --      Peak Flow --      Pain Score 07/26/19 1341 10     Pain Loc --      Pain Edu? --      Excl. in [place]? --      Constitutional: Alert and oriented. Well appearing and in no acute distress. Eyes: Conjunctivae are normal. PERRL. EOMI. Head: Atraumatic. ENT:      Ears:       Nose: No congestion/rhinnorhea.      Mouth/Throat: Mucous membranes are moist.  Neck: No stridor.    Cardiovascular: Normal rate, regular rhythm. Normal S1 and S2.  Good peripheral circulation. Respiratory: Normal respiratory effort without tachypnea or retractions. Lungs CTAB. Good air entry to the bases with no decreased or absent breath sounds. Gastrointestinal: Bowel sounds 4 quadrants. Soft and nontender to palpation. No guarding or rigidity. No palpable masses. No distention. No CVA tenderness. Genitourinary: Examination of patient's external genitals reveals erythema, edema extending from the right scrotal wall, into the perineal region.  There is some extension into the right proximal medial thigh.  Area is exquisitely tender to palpation.  Area with mixed areas of firmness with areas of fluctuance and induration.  No appreciable drainage.  Erythema and edema does not extend into the shaft of the penis.  No perirectal involvement. Musculoskeletal: Full range of motion to all extremities. No gross deformities appreciated. Neurologic:  Normal speech and language. No gross focal neurologic deficits are appreciated.  Skin:  Skin is warm, dry and intact. No rash noted. Psychiatric: Mood and affect are normal. Speech and behavior are normal. Patient exhibits appropriate insight and judgement.   ____________________________________________   LABS (all labs ordered are listed, but only abnormal  results are displayed)  Labs Reviewed  COMPREHENSIVE METABOLIC PANEL - Abnormal; Notable for the following components:      Result Value   Glucose, Bld 101 (*)    Calcium 8.8 (*)    All other components within normal limits  RESPIRATORY PANEL BY RT PCR (FLU A&B, COVID)  CULTURE, BLOOD (ROUTINE X 2)  CULTURE, BLOOD (ROUTINE X 2)  CBC   ____________________________________________  EKG   ____________________________________________  RADIOLOGY I personally viewed and evaluated these images as part of my medical decision making, as well as reviewing the written report by the radiologist.  CT PELVIS W CONTRAST  Result Date: 07/26/2019 CLINICAL DATA:  59 year old with cellulitis involving the scrotum extending into the perineal region. EXAM: CT PELVIS WITH CONTRAST TECHNIQUE: Multidetector CT imaging of the pelvis was performed using the standard protocol following the bolus administration of intravenous contrast. CONTRAST:  157mL OMNIPAQUE IOHEXOL 300 MG/ML IV. COMPARISON:  None. FINDINGS: Urinary Tract: Visualized lower poles of both kidneys normal in appearance. 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 Bowel: Visualized small bowel and colon unremarkable. Normal appearing appendix in the RIGHT upper pelvis. Vascular/Lymphatic: Mild-to-moderate atherosclerosis involving the distal abdominal aorta and the iliofemoral arteries bilaterally. Multiple upper normal-sized enhancing BILATERAL superficial inguinal lymph nodes. No deep pelvic lymphadenopathy. Reproductive: Normal-appearing prostate gland. Calcifications involving the otherwise normal appearing seminal vesicles. (See below). Other: Edema in the subcutaneous tissues of the RIGHT buttock, with an associated fluid collection with a thick enhancing wall (indicating abscess) at the gluteal fold and extending upward into the RIGHT hemiscrotum. The gluteal fold abscess  measures in total approximately 7.0 x 3.2 x 4.4 cm with the fluid component measuring approximately 3.9 x 2.0 x 3.1 cm. The scrotal abscess component measures approximately 7.9 x 2.7 x 3.9 cm. No associated soft tissue gas to confirm a diagnosis of Fournier's gangrene currently. Musculoskeletal: Degenerative disc disease at L4-5. No acute findings. IMPRESSION: 1. Abscess involving the RIGHT gluteal fold and extending upward into the RIGHT hemiscrotum. The gluteal fold abscess measures approximately 7.0 x 3.2 x 4.4 cm. The scrotal component measures approximately 7.9 x 2.7 x 3.9 cm. 2. Multiple upper normal-sized enhancing BILATERAL superficial inguinal lymph nodes, likely reactive. 3. 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 Aortic Atherosclerosis (ICD10-I70.0). Electronically Signed   By: Evangeline Dakin M.D.   On: 07/26/2019 17:01    ____________________________________________    PROCEDURES  Procedure(s) performed:    Procedures    Medications  HYDROmorphone (DILAUDID) injection 1 mg (1 mg Intravenous Given 07/26/19 1926)  sodium chloride 0.9 % bolus 1,000 mL (0 mLs Intravenous Stopping Infusion hung by another clincian 07/26/19 1827)  clindamycin (CLEOCIN) IVPB 600 mg (0 mg Intravenous Stopped 07/26/19 1826)  piperacillin-tazobactam (ZOSYN) IVPB 3.375 g (0 g Intravenous Stopped 07/26/19 1827)  morphine 4 MG/ML injection 4 mg (4 mg Intravenous Given 07/26/19 1626)  ondansetron (ZOFRAN) injection 4 mg (4 mg Intravenous Given 07/26/19 1626)  iohexol (OMNIPAQUE) 300 MG/ML solution 100 mL (100 mLs Intravenous Contrast Given 07/26/19 1632)     ____________________________________________   INITIAL IMPRESSION / ASSESSMENT AND PLAN / ED COURSE  Pertinent labs & imaging results that were available during my care of the patient were reviewed by me and considered in my medical decision making (see chart for  details).  Review of the [place] CSRS was performed in accordance of the [place] prior to dispensing any controlled drugs.  Clinical Course as of Jul 25 1938  Wed Jul 26, 2019  1601 Patient presents emergency department complaining of possible scrotal abscess.  On evaluation, patient does have extensive cellulitic findings as well as findings concerning for abscess extending from the right scrotal wall into the perineal region.  Given location, significant pain, significant tenderness to palpation, rapid spreading pain out of proportion, differential includes abscess, cellulitis, Fournier's gangrene.  Patient will be started on fluids, antibiotics, CT scan will be obtained.  At this time, patient does not have an elevated white blood cell count.   [JC]  [id number] Patient CT returned with findings concerning for 2 abscesses lying adjacent to each other, 1 to the scrotum and 1 to the perineum.The gluteal fold abscess measures approximately 7.0 x 3.2 x 4.4 cm. The scrotal component measures approximately 7.9 x 2.7 x  3.9 cm. At this time, I had attending provider, Dr. Jimmye Norman also visualized the patient as there was still ongoing concern for Fournier's gangrene even though there is no soft tissue gas. At this time, it is felt that urology should be consulted though there is not classic fournier's gangrene given extensive involvement of abscess and need for drainage and debriedment. Patient has had fluids, antibiotics at this time. On reexamination, patient does have minimal spontaneous drainage in the perineal region.     [JC]    Clinical Course User Index [JC] Paetyn Pietrzak, Charline Bills, PA-C          Patient's diagnosis is consistent with scrotal abscess, perineal abscess.  Patient presented to emergency department complaining of right-sided scrotal "boil".  On exam, patient had findings concerning for cellulitis and abscess to the right scrotal region extending into the perineal area.  Given sudden onset of  symptoms, significant pain I was concern for 40 years gangrene.  Patient received Zosyn, clindamycin and fluids in the emergency department and underwent CT scan.  CT revealed significant abscesses both to the scrotal region as well as a perineal/gluteal fold.  Given the extent of infection, I had attending provider, Dr. Jimmye Norman also visualize the patient.  Given no soft tissue gas on CT, no evidence of gangrenous tissue to the exterior, is felt that patient is not currently in Fourniers gangrene.  However given the extent and size of abscesses, it was felt that surgical incision, drainage and debridement would be the recommended course along with inpatient admission for antibiotics.  I first discussed the case with urologist given scrotal involvement.  Urology recommended consult with general surgery as well for the extending abscess into the perineal/gluteal fold.  I also discussed the case with general surgery.  Patient will be admitted to general surgery and urology for debridement, further antibiotics.  Patient stable at this time..    ____________________________________________  FINAL CLINICAL IMPRESSION(S) / ED DIAGNOSES  Final diagnoses:  Scrotal abscess  Perineal abscess      NEW MEDICATIONS STARTED DURING THIS VISIT:  ED Discharge Orders    None          This chart was dictated using voice recognition software/Dragon. Despite best efforts to proofread, errors can occur which can change the meaning. Any change was purely unintentional.    Darletta Moll, PA-C 07/26/19 1940    Earleen Newport, MD 07/26/19 1945

## 2019-07-26 NOTE — ED Notes (Signed)
Dr Brandon in with pt   

## 2019-07-26 NOTE — H&P (Signed)
I have been asked to see the patient by Dr. Jeral Fruit, for evaluation and management of scrotal/perineal abscess.  Chief Complaint: Scrotal pain and drainage  History of Present Illness: Caleb Brooks is a 59 y.o. year old male with 3 days of scrotal pain, tenderness in the right inguinal and perineal area and drainage who presented to the emergency room with 10-10 pain.  He was seen earlier today as an outpatient advised follow-up in the emergency room.  Noncontrast CT scan was performed showing a large fluid collection consistent with abscess in the right hemiscrotum perineum extending to the right buttock.  Urology was called consulted for surgical management.  Fevers or chills.  No urinary issues.  The patient does have a personal history of scrotal abscesses and underwent surgical excision/I&D of this x2 on the left side.  This was in central present about 7 years ago.  Past medical history as below.  Is also does include history of alcohol and marijuana abuse.  He is currently n.p.o.  He is already received IV Zosyn and clindamycin.  Past Medical History:  Diagnosis Date  . Anxiety   . Arthritis   . Hypertension     Past Surgical History:  Procedure Laterality Date  . CYST REMOVAL TRUNK     Groin area around 2000    Home Medications:  No outpatient medications have been marked as taking for the 07/26/19 encounter [facility] Encounter).    Allergies:  Allergies  Allergen Reactions  . Ibuprofen Other (See Comments)    Stomach upset    Family History  Problem Relation Age of Onset  . Diabetes Father   . Hypertension Father   . Congestive Heart Failure Father   . Hypertension Sister   . CVA Sister   . Bipolar disorder Brother   . Schizophrenia Brother     Social History:  reports that he has been smoking cigarettes. He has been smoking about 0.50 packs per day. He has never used smokeless tobacco. He reports current alcohol use. He reports current drug use.  Drug: Marijuana.  ROS: A complete review of systems was performed.  All systems are negative except for pertinent findings as noted.  Physical Exam:  Vital signs in last 24 hours: Temp:  [97 F (36.1 C)-98.6 F (37 C)] 98.6 F (37 C) (02/17 1403) Pulse Rate:  [72-74] 72 (02/17 1403) Resp:  [20] 20 (02/17 1403) BP: (142-144)/(85-92) 144/92 (02/17 1403) SpO2:  [97 %-98 %] 97 % (02/17 1403) Weight:  [80.7 kg-88.2 kg] 80.7 kg (02/17 1341) Constitutional:  Alert and oriented, No acute distress HEENT: [place] AT, moist mucus membranes.  Trachea midline, no masses Cardiovascular: Regular rate and rhythm, no clubbing, cyanosis, or edema. Respiratory: Normal respiratory effort, lungs clear bilaterally GI: Abdomen is soft, nontender, nondistended, no abdominal masses GU: Normal phallus with orthotopic meatus without discharge.  Left hemiscrotum with large linear scar tissue.  Normal testicles bilaterally.  In the right hemiscrotum, there is fluctuant and drainage adjacent to the right inguinal fold tracking linearly and inferior into the perineum where there is also a large fluctuant and necessitating IR with a small amount of purulence.  No clear perirectal or buttock involvement on my exam at the bedside but this is somewhat limited due to patient positioning and discomfort.  No crepitus appreciated. Skin: No rashes, bruises or suspicious lesions Neurologic: Grossly intact, no focal deficits, moving all 4 extremities Psychiatric: Normal mood and affect   Laboratory Data:  Recent  Labs    07/26/19 1404  WBC 8.8  HGB 15.0  HCT 45.4   Recent Labs    07/26/19 1404  NA 138  K 3.6  CL 103  CO2 26  GLUCOSE 101*  BUN 11  CREATININE 0.93  CALCIUM 8.8*   No results for input(s): LABPT, INR in the last 72 hours. No results for input(s): LABURIN in the last 72 hours. Results for orders placed or performed during the hospital encounter of 07/26/19  Respiratory Panel by RT PCR (Flu A&B, Covid)  - Nasopharyngeal Swab     Status: None   Collection Time: 07/26/19  6:32 PM   Specimen: Nasopharyngeal Swab  Result Value Ref Range Status   SARS Coronavirus 2 by RT PCR NEGATIVE NEGATIVE Final    Comment: (NOTE) SARS-CoV-2 target nucleic acids are NOT DETECTED. The SARS-CoV-2 RNA is generally detectable in upper respiratoy specimens during the acute phase of infection. The lowest concentration of SARS-CoV-2 viral copies this assay can detect is 131 copies/mL. A negative result does not preclude SARS-Cov-2 infection and should not be used as the sole basis for treatment or other patient management decisions. A negative result may occur with  improper specimen collection/handling, submission of specimen other than nasopharyngeal swab, presence of viral mutation(s) within the areas targeted by this assay, and inadequate number of viral copies (<131 copies/mL). A negative result must be combined with clinical observations, patient history, and epidemiological information. The expected result is Negative. Fact Sheet for Patients:  PinkCheek.be Fact Sheet for Healthcare Providers:  GravelBags.it This test is not yet ap proved or cleared by the [place] FDA and  has been authorized for detection and/or diagnosis of SARS-CoV-2 by FDA under an Emergency Use Authorization (EUA). This EUA will remain  in effect (meaning this test can be used) for the duration of the COVID-19 declaration under Section 564(b)(1) of the Act, 21 U.S.C. section 360bbb-3(b)(1), unless the authorization is terminated or revoked sooner.    Influenza A by PCR NEGATIVE NEGATIVE Final   Influenza B by PCR NEGATIVE NEGATIVE Final    Comment: (NOTE) The Xpert Xpress SARS-CoV-2/FLU/RSV assay is intended as an aid in  the diagnosis of influenza from Nasopharyngeal swab specimens and  should not be used as a sole basis for treatment. Nasal washings and   aspirates are unacceptable for Xpert Xpress SARS-CoV-2/FLU/RSV  testing. Fact Sheet for Patients: PinkCheek.be Fact Sheet for Healthcare Providers: GravelBags.it This test is not yet approved or cleared by the [place] FDA and  has been authorized for detection and/or diagnosis of SARS-CoV-2 by  FDA under an Emergency Use Authorization (EUA). This EUA will remain  in effect (meaning this test can be used) for the duration of the  Covid-19 declaration under Section 564(b)(1) of the Act, 21  U.S.C. section 360bbb-3(b)(1), unless the authorization is  terminated or revoked. Performed at [facility], [place], [place] [address]      Radiologic Imaging: CT PELVIS W CONTRAST  Result Date: 07/26/2019 CLINICAL DATA:  59 year old with cellulitis involving the scrotum extending into the perineal region. EXAM: CT PELVIS WITH CONTRAST TECHNIQUE: Multidetector CT imaging of the pelvis was performed using the standard protocol following the bolus administration of intravenous contrast. CONTRAST:  138mL OMNIPAQUE IOHEXOL 300 MG/ML IV. COMPARISON:  None. FINDINGS: Urinary Tract: Visualized lower poles of both kidneys normal in appearance. 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 Bowel: Visualized  small bowel and colon unremarkable. Normal appearing appendix in the RIGHT upper pelvis. Vascular/Lymphatic: Mild-to-moderate atherosclerosis involving the distal abdominal aorta and the iliofemoral arteries bilaterally. Multiple upper normal-sized enhancing BILATERAL superficial inguinal lymph nodes. No deep pelvic lymphadenopathy. Reproductive: Normal-appearing prostate gland. Calcifications involving the otherwise normal appearing seminal vesicles. (See below). Other: Edema in the subcutaneous tissues of the RIGHT buttock, with an associated fluid  collection with a thick enhancing wall (indicating abscess) at the gluteal fold and extending upward into the RIGHT hemiscrotum. The gluteal fold abscess measures in total approximately 7.0 x 3.2 x 4.4 cm with the fluid component measuring approximately 3.9 x 2.0 x 3.1 cm. The scrotal abscess component measures approximately 7.9 x 2.7 x 3.9 cm. No associated soft tissue gas to confirm a diagnosis of Fournier's gangrene currently. Musculoskeletal: Degenerative disc disease at L4-5. No acute findings. IMPRESSION: 1. Abscess involving the RIGHT gluteal fold and extending upward into the RIGHT hemiscrotum. The gluteal fold abscess measures approximately 7.0 x 3.2 x 4.4 cm. The scrotal component measures approximately 7.9 x 2.7 x 3.9 cm. 2. Multiple upper normal-sized enhancing BILATERAL superficial inguinal lymph nodes, likely reactive. 3. 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 Aortic Atherosclerosis (ICD10-I70.0). Electronically Signed   By: Evangeline Dakin M.D.   On: 07/26/2019 17:01   CT scan was personally reviewed today and with the patient.  Impression/Assessment:  59 year old male with large right hemiscrotal abscess extending into the perineum.  This may also involve the right gluteal fold as appreciated on CT scan.  I recommended scrotal incision and drainage with washout packing and possible drain placement.  We discussed the risk of this procedure including primarily discomfort, bleeding, recurrence amongst others.  Postoperative course was also discussed.  We will plan to admit him for continuation of IV antibiotics on his first dressing change.  Informed consent was obtained verbally.  All questions answered.  Plan:  -Consent obtained -2 OR for I&D -Will adjust antibiotics based on culture and sensitivity -Supportive care -Home meds -DVT prophylaxis  Case is also discussed with Dr. Lysle Pearl.  Given that appears  of the abscess may involve the gluteal fold, may seek his involvement intraoperatively as deemed necessary.  He is agreeable this plan.  The patient was also aware of this fact and is  agreeable.  07/26/2019, 7:30 PM  Hollice Espy,  MD

## 2019-07-26 NOTE — Op Note (Signed)
Date of procedure: 07/26/19  Preoperative diagnosis:  1. Scrotal and perineal abscess  Postoperative diagnosis:  1. Same as above  Procedure: 1. Scrotal and perineal abscess I&D with washout  Surgeon: Hollice Espy, MD  Co-surgeon: Florene Route, MD  Anesthesia: General  Complications: None  Intraoperative findings: Approximately 7 cm incision in the dependent right hemiscrotum with copious purulent material, at least 25 cc tracking to right buttock.  Lysle Pearl available for gluteal tract involvement.  EBL: Minimal  Specimens: Wound culture  Drains: Quarter inch Penrose drain  Indication: Caleb Brooks is a 59 y.o. patient with large scrotal and perineal abscess tracking to the right buttock.  After reviewing the management options for treatment, he elected to proceed with the above surgical procedure(s). We have discussed the potential benefits and risks of the procedure, side effects of the proposed treatment, the likelihood of the patient achieving the goals of the procedure, and any potential problems that might occur during the procedure or recuperation. Informed consent has been obtained.  Description of procedure:  The patient was taken to the operating room and general anesthesia was induced.  The patient was placed in the dorsal lithotomy position, prepped and draped in the usual sterile fashion, and preoperative antibiotics were administered previously in the emergency room. A preoperative time-out was performed.   The patient was examined again under anesthesia.  There is a large fluctuant area, at least 7 cm which was mounded up and draining a small amount of purulent material tracking from the dependent lateral aspect of his right hemiscrotum down into the perineal area which was indurated and raised.  There is also firmness adjacent in his right group gluteal area.  It is point time, a 15 blade was used to make an approximately 7 cm incision into the scrotum down into the  area of perineal fluctuance.  Loculations were broken up mechanically and a large amount of purulent material was drained, at least 25 cc.  Wound culture was obtained.  All additional pockets both laterally were broken up bluntly.  At this point time, Dr. Lysle Pearl examined the patient under anesthesia as well and elected to place a counterincision in tunnel to drain.  Please see his note for additional details of this portion of the procedure.  Finally, the wound was copiously lavaged irrigated using a pulse irrigator.  No additional loculations were identified.  Hemostasis was achieved.  The larger aspect of the open wound was packed using an unmarked 4 x 4 and then dressed with 4 x 4's and ABD pad along with mesh panties.  I did instill the edges of the wound with a mixture of 10 cc of lidocaine and Marcaine for local anesthetic.  Patient was then reversed myesthesia taken to PACU stable condition.  There are no complications in this case.  Plan: Plan for admission for IV antibiotics and continued supportive care.  We will perform a dressing change in the morning and likely discharge home on antibiotics.  Hollice Espy, M.D.

## 2019-07-26 NOTE — ED Triage Notes (Signed)
Pt reports abscess to his scrotal area for the past 2 days. Pt reports MD advised him to come to the ED. Pt unable to sit.

## 2019-07-26 NOTE — Progress Notes (Signed)
Established Patient Office Visit  Subjective:  Patient ID: Caleb Brooks, male    DOB: 1961-04-05  Age: 59 y.o. MRN: [id number]  CC:  Chief Complaint  Patient presents with  . Annual Exam    notes pain while ambulating and limited mobility    HPI Caleb Brooks presents for complaint of cyst to right perineal area under his right scrotum that erupted 3 days ago. He states that it's hard and very painful to touch. He reports that his pain is a constant, sharp and non radiating 10/10. He states that it affects his mobility and sitting. He denies fever and chills. He states that it's a recurrent problem and he states that he can't afford Benzoyl peroxide as ordered by Dermatology . He states that he's doing well,but for the cyst.  Past Medical History:  Diagnosis Date  . Anxiety   . Arthritis   . Hypertension     Past Surgical History:  Procedure Laterality Date  . CYST REMOVAL TRUNK     Groin area around 2000    Family History  Problem Relation Age of Onset  . Diabetes Father   . Hypertension Father   . Congestive Heart Failure Father   . Hypertension Sister   . CVA Sister   . Bipolar disorder Brother   . Schizophrenia Brother     Social History   Socioeconomic History  . Marital status: Single    Spouse name: Not on file  . Number of children: Not on file  . Years of education: Not on file  . Highest education level: Not on file  Occupational History  . Not on file  Tobacco Use  . Smoking status: Current Every Day Smoker    Packs/day: 0.50    Types: Cigarettes  . Smokeless tobacco: Never Used  . Tobacco comment: 5-10 cigarettes a day  Substance and Sexual Activity  . Alcohol use: Yes    Comment: 32 oz a day  . Drug use: Yes    Types: Marijuana  . Sexual activity: Not on file  Other Topics Concern  . Not on file  Social History Narrative  . Not on file   Social Determinants of Health   Financial Resource Strain:   . Difficulty of Paying Living  Expenses: Not on file  Food Insecurity:   . Worried About Charity fundraiser in the Last Year: Not on file  . Ran Out of Food in the Last Year: Not on file  Transportation Needs:   . Lack of Transportation (Medical): Not on file  . Lack of Transportation (Non-Medical): Not on file  Physical Activity:   . Days of Exercise per Week: Not on file  . Minutes of Exercise per Session: Not on file  Stress:   . Feeling of Stress : Not on file  Social Connections:   . Frequency of Communication with Friends and Family: Not on file  . Frequency of Social Gatherings with Friends and Family: Not on file  . Attends Religious Services: Not on file  . Active Member of Clubs or Organizations: Not on file  . Attends Archivist Meetings: Not on file  . Marital Status: Not on file  Intimate Partner Violence:   . Fear of Current or Ex-Partner: Not on file  . Emotionally Abused: Not on file  . Physically Abused: Not on file  . Sexually Abused: Not on file    Outpatient Medications Prior to Visit  Medication Sig Dispense Refill  .  celecoxib (CELEBREX) 100 MG capsule TAKE 1 CAPSULE BY MOUTH twice a day as needed 30 capsule 0  . gabapentin (NEURONTIN) 100 MG capsule Take 1 capsule (100 mg total) by mouth 2 (two) times daily. Take 1 capsule (100 mg total) by mouth 2 times daily as needed 60 capsule 1  . terbinafine (LAMISIL) 1 % cream Apply 1 application topically 2 (two) times daily. 30 g 0  . Benzoyl Peroxide (BENZOYL PEROXIDE) 10 % LIQD Apply 148 mLs topically daily. (Patient not taking: Reported on 07/26/2019) 1 g 0   No facility-administered medications prior to visit.    Allergies  Allergen Reactions  . Ibuprofen Other (See Comments)    Stomach upset    ROS Review of Systems  Constitutional: Negative.   Respiratory: Negative.   Skin:       Boil to perineal area  Neurological: Negative.       Objective:    Physical Exam  Constitutional: He is oriented to person, place, and  time.  Cardiovascular: Normal rate and regular rhythm.  Pulmonary/Chest: Effort normal and breath sounds normal.  Neurological: He is alert and oriented to person, place, and time.  Skin:     Psychiatric: He has a normal mood and affect. His behavior is normal. Judgment and thought content normal.    BP (!) 142/85   Pulse 74   Temp (!) 97 F (36.1 C)   Ht 5\' 11"  (1.803 m)   Wt 194 lb 8 oz (88.2 kg)   SpO2 98%   BMI 27.13 kg/m  Wt Readings from Last 3 Encounters:  07/26/19 178 lb (80.7 kg)  07/26/19 194 lb 8 oz (88.2 kg)  03/21/19 182 lb (82.6 kg)     Health Maintenance Due  Topic Date Due  . Hepatitis C Screening  [birth year], 1960  . HIV Screening  06/13/1977  . TETANUS/TDAP  06/13/1981    There are no preventive care reminders to display for this patient.  Lab Results  Component Value Date   TSH 2.91 01/01/2015   Lab Results  Component Value Date   WBC 4.2 01/25/2019   HGB 15.7 01/25/2019   HCT 46.3 01/25/2019   MCV 92 01/25/2019   PLT 227 01/25/2019   Lab Results  Component Value Date   NA 141 01/25/2019   K 4.2 01/25/2019   CO2 25 01/25/2019   GLUCOSE 83 01/25/2019   BUN 10 01/25/2019   CREATININE 1.08 01/25/2019   BILITOT 0.2 01/25/2019   ALKPHOS 123 (H) 01/25/2019   AST 17 01/25/2019   ALT 10 01/25/2019   PROT 8.0 01/25/2019   ALBUMIN 4.4 01/25/2019   CALCIUM 9.4 01/25/2019   ANIONGAP 9 09/04/2018   Lab Results  Component Value Date   CHOL 141 01/25/2019   Lab Results  Component Value Date   HDL 60 01/25/2019   Lab Results  Component Value Date   LDLCALC 67 01/25/2019   Lab Results  Component Value Date   TRIG 68 01/25/2019   Lab Results  Component Value Date   CHOLHDL 2.4 01/25/2019   Lab Results  Component Value Date   HGBA1C 5.4 01/25/2019      Assessment & Plan:   1. Cyst of perianal area - Possible infected cyst or abscess and he was advised to go to the ED for evaluation. - He was encouraged to complete [facility] charity  care application for Ambulatory referral to Dermatology      Follow-up: Return in about 3 weeks (around 08/16/2019), or  if symptoms worsen or fail to improve.    Griffon Herberg Jerold Coombe, NP

## 2019-07-26 NOTE — Transfer of Care (Signed)
Immediate Anesthesia Transfer of Care Note  Patient: Caleb Brooks  Procedure(s) Performed: INCISION AND DRAINAGE ABSCESS (N/A Scrotum) INCISION AND DRAINAGE ABSCESS (N/A )  Patient Location: PACU  Anesthesia Type:General 3 Level of Consciousness: awake and patient cooperative  Airway & Oxygen Therapy: Patient Spontanous Breathing and Patient connected to face mask  Post-op Assessment: Report given to RN  Post vital signs: Reviewed  Last Vitals:  Vitals Value Taken Time  BP    Temp    Pulse 100 07/26/19 2112  Resp    SpO2 93 % 07/26/19 2112  Vitals shown include unvalidated device data.  Last Pain:  Vitals:   07/26/19 1403  TempSrc: Oral  PainSc:          Complications: No apparent anesthesia complications

## 2019-07-26 NOTE — Anesthesia Procedure Notes (Signed)
Procedure Name: Intubation Date/Time: 07/26/2019 8:23 PM Performed by: Lendon Colonel, CRNA Pre-anesthesia Checklist: Patient identified, Patient being monitored, Timeout performed, Emergency Drugs available and Suction available Patient Re-evaluated:Patient Re-evaluated prior to induction Oxygen Delivery Method: Circle system utilized Preoxygenation: Pre-oxygenation with 100% oxygen Induction Type: IV induction Ventilation: Mask ventilation without difficulty Laryngoscope Size: 3 and McGraph Grade View: Grade I Tube type: Oral Tube size: 7.0 mm Number of attempts: 1 Airway Equipment and Method: Stylet Placement Confirmation: ETT inserted through vocal cords under direct vision,  positive ETCO2 and breath sounds checked- equal and bilateral Secured at: 21 cm Tube secured with: Tape Dental Injury: Teeth and Oropharynx as per pre-operative assessment

## 2019-07-26 NOTE — Anesthesia Preprocedure Evaluation (Signed)
Anesthesia Evaluation  Patient identified by MRN, date of birth, ID band Patient awake    Reviewed: Allergy & Precautions, H&P , NPO status , Patient's Chart, lab work & pertinent test results  History of Anesthesia Complications Negative for: history of anesthetic complications  Airway Mallampati: III  TM Distance: >3 FB Neck ROM: full    Dental  (+) Chipped, Poor Dentition   Pulmonary neg shortness of breath, Current Smoker and Patient abstained from smoking.,           Cardiovascular Exercise Tolerance: Good hypertension, (-) angina(-) Past MI and (-) DOE      Neuro/Psych PSYCHIATRIC DISORDERS negative neurological ROS  negative psych ROS   GI/Hepatic negative GI ROS, Neg liver ROS, neg GERD  ,  Endo/Other  negative endocrine ROS  Renal/GU      Musculoskeletal  (+) Arthritis ,   Abdominal   Peds  Hematology negative hematology ROS (+)   Anesthesia Other Findings Past Medical History: No date: Anxiety No date: Arthritis No date: Hypertension  Past Surgical History: No date: CYST REMOVAL TRUNK     Comment:  Groin area around 2000  BMI    Body Mass Index: 24.83 kg/m      Reproductive/Obstetrics negative OB ROS                             Anesthesia Physical Anesthesia Plan  ASA: III  Anesthesia Plan: General ETT   Post-op Pain Management:    Induction: Intravenous  PONV Risk Score and Plan: Ondansetron, Dexamethasone, Midazolam and Treatment may vary due to age or medical condition  Airway Management Planned: Oral ETT  Additional Equipment:   Intra-op Plan:   Post-operative Plan: Extubation in OR  Informed Consent: I have reviewed the patients History and Physical, chart, labs and discussed the procedure including the risks, benefits and alternatives for the proposed anesthesia with the patient or authorized representative who has indicated his/her understanding  and acceptance.     Dental Advisory Given  Plan Discussed with: Anesthesiologist, CRNA and Surgeon  Anesthesia Plan Comments: (Patient consented for risks of anesthesia including but not limited to:  - adverse reactions to medications - damage to teeth, lips or other oral mucosa - sore throat or hoarseness - Damage to heart, brain, lungs or loss of life  Patient voiced understanding.)        Anesthesia Quick Evaluation

## 2019-07-26 NOTE — ED Provider Notes (Signed)
Patient was seen and examined by me, I agree with the impression plan as dictated by Betha Loa.  Patient with extensive scrotal and perineal region abscesses which will require extensive incision and drainage.  I do not think this is Fournier's gangrene but this will require extensive debridement.  We will discuss with urology.  He has received broad-spectrum antibiotics.   Earleen Newport, MD 07/26/19 [phone]

## 2019-07-26 NOTE — ED Notes (Signed)
All pt clothing has been removed and pt given a hospital mask. Report given to the OR.

## 2019-07-26 NOTE — ED Notes (Signed)
Dr Lysle Pearl in with pt.

## 2019-07-27 ENCOUNTER — Telehealth: Payer: Self-pay | Admitting: Urology

## 2019-07-27 LAB — HIV ANTIBODY (ROUTINE TESTING W REFLEX): HIV Screen 4th Generation wRfx: NONREACTIVE

## 2019-07-27 MED ORDER — DIPHENHYDRAMINE HCL 50 MG/ML IJ SOLN: 12.5000 mg | Freq: Four times a day (QID) | INTRAMUSCULAR | Status: DC | PRN

## 2019-07-27 MED ORDER — OXYCODONE-ACETAMINOPHEN 5-325 MG PO TABS
1.0000 | ORAL_TABLET | ORAL | Status: DC | PRN
  Administered 2019-07-27: 08:00:00 1 via ORAL
  Administered 2019-07-27: 2 via ORAL
  Filled 2019-07-27 (×2): qty 2

## 2019-07-27 MED ORDER — ZOLPIDEM TARTRATE 5 MG PO TABS: 5.0000 mg | ORAL_TABLET | Freq: Every evening | ORAL | Status: DC | PRN

## 2019-07-27 MED ORDER — ONDANSETRON HCL 4 MG/2ML IJ SOLN: 4.0000 mg | INTRAMUSCULAR | Status: DC | PRN

## 2019-07-27 MED ORDER — DOCUSATE SODIUM 100 MG PO CAPS
100.0000 mg | ORAL_CAPSULE | Freq: Two times a day (BID) | ORAL | Status: DC
  Administered 2019-07-27: 100 mg via ORAL
  Filled 2019-07-27: qty 1

## 2019-07-27 MED ORDER — SULFAMETHOXAZOLE-TRIMETHOPRIM 400-80 MG PO TABS: 1.0000 | ORAL_TABLET | Freq: Two times a day (BID) | ORAL | 0 refills | Status: DC

## 2019-07-27 MED ORDER — PIPERACILLIN-TAZOBACTAM 3.375 G IVPB 30 MIN
3.3750 g | Freq: Once | INTRAVENOUS | Status: AC
  Administered 2019-07-27: 13:00:00 3.375 g via INTRAVENOUS
  Filled 2019-07-27 (×2): qty 50

## 2019-07-27 MED ORDER — OXYCODONE HCL 5 MG PO TABS: 5.0000 mg | ORAL_TABLET | Freq: Three times a day (TID) | ORAL | 0 refills | Status: DC | PRN

## 2019-07-27 MED ORDER — GABAPENTIN 100 MG PO CAPS
100.0000 mg | ORAL_CAPSULE | Freq: Two times a day (BID) | ORAL | Status: DC
  Administered 2019-07-27: 08:00:00 100 mg via ORAL
  Filled 2019-07-27: qty 1

## 2019-07-27 MED ORDER — ACETAMINOPHEN 325 MG PO TABS: 650.0000 mg | ORAL_TABLET | ORAL | Status: DC | PRN

## 2019-07-27 MED ORDER — PIPERACILLIN-TAZOBACTAM 3.375 G IVPB: 3.3750 g | Freq: Three times a day (TID) | INTRAVENOUS | Status: DC

## 2019-07-27 MED ORDER — DIPHENHYDRAMINE HCL 12.5 MG/5ML PO ELIX
12.5000 mg | ORAL_SOLUTION | Freq: Four times a day (QID) | ORAL | Status: DC | PRN
  Administered 2019-07-27: 14:00:00 12.5 mg via ORAL
  Filled 2019-07-27: qty 5

## 2019-07-27 MED ORDER — SODIUM CHLORIDE 0.9 % IV SOLN: INTRAVENOUS | Status: DC

## 2019-07-27 MED ORDER — MORPHINE SULFATE (PF) 2 MG/ML IV SOLN
2.0000 mg | INTRAVENOUS | Status: DC | PRN
  Administered 2019-07-27: 13:00:00 2 mg via INTRAVENOUS
  Filled 2019-07-27: qty 1

## 2019-07-27 MED ORDER — HEPARIN SODIUM (PORCINE) 5000 UNIT/ML IJ SOLN: 5000.0000 [IU] | Freq: Three times a day (TID) | INTRAMUSCULAR | Status: DC

## 2019-07-27 NOTE — Discharge Summary (Signed)
Date of admission: 07/26/2019  Date of discharge: 07/27/2019  Admission diagnosis: Scrotal/perineal abscess  Discharge diagnosis: Scrotal/perineal abscess  Secondary diagnoses:  Patient Active Problem List   Diagnosis Date Noted  . Cyst of perianal area 07/26/2019  . Scrotal abscess 07/26/2019  . Onychomycosis 03/21/2019  . Callus of foot 02/07/2019  . Nail thickening 02/07/2019  . Encounter to establish care 02/07/2019  . Right middle ear infection 02/07/2019  . Right otitis media 02/07/2019  . Elevated blood pressure reading 02/07/2019  . Tobacco abuse 01/01/2015  . ETOH abuse 01/01/2015  . Arthritis 10/02/2014  . Hypertension 08/02/2014    History and Physical: For full details, please see admission history and physical. Briefly, Caleb Brooks is a 59 y.o. year old patient with perineal/scrotal abscess.  He was taken to the operating room for I&D of the abscess at which time drain was placed.  He received IV antibiotics in the form of Zosyn/clindamycin.  Hospital Course: Patient tolerated the procedure well.  He was then transferred to the floor after an uneventful PACU stay.  His hospital course was uncomplicated.  On POD#1 he had met discharge criteria: was eating a regular diet, was up and ambulating independently,  pain was well controlled, was voiding without a catheter, and was ready to for discharge.  Dressing/packing exchanged on day of discharge.   Laboratory values:  Recent Labs    07/26/19 1404  WBC 8.8  HGB 15.0  HCT 45.4   Recent Labs    07/26/19 1404  NA 138  K 3.6  CL 103  CO2 26  GLUCOSE 101*  BUN 11  CREATININE 0.93  CALCIUM 8.8*   No results for input(s): LABPT, INR in the last 72 hours. No results for input(s): LABURIN in the last 72 hours. Results for orders placed or performed during the hospital encounter of 07/26/19  Culture, blood (routine x 2)     Status: None (Preliminary result)   Collection Time: 07/26/19  4:13 PM   Specimen: BLOOD   Result Value Ref Range Status   Specimen Description BLOOD RIGHT ANTECUBITAL  Final   Special Requests   Final    BOTTLES DRAWN AEROBIC AND ANAEROBIC Blood Culture results may not be optimal due to an excessive volume of blood received in culture bottles   Culture   Final    NO GROWTH < 24 HOURS Performed at [facility], [address], [place], [place] [address]    Report Status PENDING  Incomplete  Culture, blood (routine x 2)     Status: None (Preliminary result)   Collection Time: 07/26/19  4:13 PM   Specimen: BLOOD  Result Value Ref Range Status   Specimen Description BLOOD LEFT ANTECUBITAL  Final   Special Requests   Final    BOTTLES DRAWN AEROBIC AND ANAEROBIC Blood Culture results may not be optimal due to an excessive volume of blood received in culture bottles   Culture   Final    NO GROWTH < 24 HOURS Performed at [facility], [address] [place], [place] [address]    Report Status PENDING  Incomplete  Respiratory Panel by RT PCR (Flu A&B, Covid) - Nasopharyngeal Swab     Status: None   Collection Time: 07/26/19  6:32 PM   Specimen: Nasopharyngeal Swab  Result Value Ref Range Status   SARS Coronavirus 2 by RT PCR NEGATIVE NEGATIVE Final    Comment: (NOTE) SARS-CoV-2 target nucleic acids are NOT DETECTED. The SARS-CoV-2 RNA is  generally detectable in upper respiratoy specimens during the acute phase of infection. The lowest concentration of SARS-CoV-2 viral copies this assay can detect is 131 copies/mL. A negative result does not preclude SARS-Cov-2 infection and should not be used as the sole basis for treatment or other patient management decisions. A negative result may occur with  improper specimen collection/handling, submission of specimen other than nasopharyngeal swab, presence of viral mutation(s) within the areas targeted by this assay, and inadequate number of viral copies (<131 copies/mL). A negative result must be combined with  clinical observations, patient history, and epidemiological information. The expected result is Negative. Fact Sheet for Patients:  PinkCheek.be Fact Sheet for Healthcare Providers:  GravelBags.it This test is not yet ap proved or cleared by the [place] FDA and  has been authorized for detection and/or diagnosis of SARS-CoV-2 by FDA under an Emergency Use Authorization (EUA). This EUA will remain  in effect (meaning this test can be used) for the duration of the COVID-19 declaration under Section 564(b)(1) of the Act, 21 U.S.C. section 360bbb-3(b)(1), unless the authorization is terminated or revoked sooner.    Influenza A by PCR NEGATIVE NEGATIVE Final   Influenza B by PCR NEGATIVE NEGATIVE Final    Comment: (NOTE) The Xpert Xpress SARS-CoV-2/FLU/RSV assay is intended as an aid in  the diagnosis of influenza from Nasopharyngeal swab specimens and  should not be used as a sole basis for treatment. Nasal washings and  aspirates are unacceptable for Xpert Xpress SARS-CoV-2/FLU/RSV  testing. Fact Sheet for Patients: PinkCheek.be Fact Sheet for Healthcare Providers: GravelBags.it This test is not yet approved or cleared by the [place] FDA and  has been authorized for detection and/or diagnosis of SARS-CoV-2 by  FDA under an Emergency Use Authorization (EUA). This EUA will remain  in effect (meaning this test can be used) for the duration of the  Covid-19 declaration under Section 564(b)(1) of the Act, 21  U.S.C. section 360bbb-3(b)(1), unless the authorization is  terminated or revoked. Performed at [facility], [address] [place], [place] [address]   Aerobic/Anaerobic Culture (surgical/deep wound)     Status: None (Preliminary result)   Collection Time: 07/26/19  8:35 PM   Specimen: PATH Other; Abscess  Result Value Ref Range Status    Specimen Description   Final    ABSCESS Performed at [facility], [address] [place], [place] [address]    Special Requests   Final    SCROTAL ABSCESS Performed at [facility], [address] [place], [place] [address]    Gram Stain   Final    MODERATE WBC PRESENT, PREDOMINANTLY PMN MODERATE GRAM POSITIVE COCCI FEW GRAM POSITIVE RODS    Culture   Final    NO GROWTH < 12 HOURS Performed at [facility], [place] [address], [place], [place] [address]    Report Status PENDING  Incomplete    Exam: Physical Exam Vitals reviewed.  Constitutional:      Appearance: Normal appearance.  HENT:     Head: Normocephalic and atraumatic.  Cardiovascular:     Rate and Rhythm: Normal rate.  Pulmonary:     Effort: Pulmonary effort is normal. No respiratory distress.  Abdominal:     General: Abdomen is flat. There is no distension.     Palpations: Abdomen is soft.  Genitourinary:    Penis: Normal.      Comments: Right hemiscrotal wound open, packing removed found to have small amount of granulation tissue  and clean wound edges, dramatically improved surrounding induration without erythema.  Drain in place to counter buttock incision. Skin:    General: Skin is warm.     Capillary Refill: Capillary refill takes less than 2 seconds.  Neurological:     Mental Status: He is alert and oriented to person, place, and time.  Psychiatric:        Mood and Affect: Mood normal.        Behavior: Behavior normal.      Disposition: Home  Discharge instruction: The patient was instructed to be ambulatory but told to refrain from heavy lifting, strenuous activity, or driving with narcotics.  Discharge medications:  Allergies as of 07/27/2019      Reactions   Ibuprofen Other (See Comments)   Stomach upset      Medication List    TAKE these medications   benzoyl peroxide 10 % Liqd Generic drug: Benzoyl Peroxide Apply 148 mLs topically daily.   celecoxib 100 MG  capsule Commonly known as: CeleBREX TAKE 1 CAPSULE BY MOUTH twice a day as needed What changed:   how much to take  how to take this  when to take this   gabapentin 100 MG capsule Commonly known as: NEURONTIN Take 1 capsule (100 mg total) by mouth 2 (two) times daily. Take 1 capsule (100 mg total) by mouth 2 times daily as needed   oxyCODONE 5 MG immediate release tablet Commonly known as: Roxicodone Take 1 tablet (5 mg total) by mouth every 8 (eight) hours as needed. Take 3 tablets 45 min prior to dressing change   sulfamethoxazole-trimethoprim 400-80 MG tablet Commonly known as: Bactrim Take 1 tablet by mouth 2 (two) times daily.   terbinafine 1 % cream Commonly known as: LAMISIL Apply 1 application topically 2 (two) times daily.       Followup:  Follow-up Information    Hollice Espy, MD In 1 day.   Specialty: Urology Why: Friday at 11 am Contact information: [place] [address] [phone]        Benjamine Sprague, DO In 2 weeks.   Specialty: Surgery Contact information: [place] [address] [phone]        [facility] Follow up.   Specialty: Primary Care Contact information: [place] [phone]

## 2019-07-27 NOTE — Telephone Encounter (Signed)
-----   Message from Hollice Espy, MD sent at 07/27/2019  2:02 PM EST ----- Regarding: f/u Friday at 11 This is a patient who was discharged today.  He needs to come in tomorrow to clinic at 11 (only time he can get a ride) for a scrotal dressing exchange.  Hollice Espy, MD

## 2019-07-27 NOTE — TOC Transition Note (Signed)
Transition of Care [facility]) - CM/SW Discharge Note   Patient Details  Name: DEVANSH RIESE MRN: [id number] Date of Birth: 03-19-1961  [facility] of [facility] CM/SW Contact:  Elease Hashimoto, LCSW Phone Number: 07/27/2019, 1:16 PM   Clinical Narrative:   Met with pt to discuss if connected with [facility] and [facility]. He is and receives his medications through [facility]. He had questions regarding financial form for the hospital. Have emailed financial counselor and asked for her to call him in the room or at home to answer his questions. Pt has no discharge needs and has someone to transport him home and be there with him to make sure his needs are met. Will sign off due to DC order today and no needs.    Final next level of care: Home/Self Care Barriers to Discharge: Inadequate or no insurance   Patient Goals and CMS Choice Patient states their goals for this hospitalization and ongoing recovery are:: I'm going home today? Pt sounded surprised MD wrote discharge order for home today      Discharge Placement                       Discharge Plan and Services In-house Referral: Clinical Social Work Discharge Planning Services: NA                                 Social Determinants of Health (SDOH) Interventions     Readmission Risk Interventions No flowsheet data found.

## 2019-07-27 NOTE — Plan of Care (Signed)
  Problem: Education: Goal: Knowledge of General Education information will improve Description: Including pain rating scale, medication(s)/side effects and non-pharmacologic comfort measures Outcome: Progressing   Problem: Clinical Measurements: Goal: Ability to maintain clinical measurements within normal limits will improve Outcome: Progressing Goal: Diagnostic test results will improve Outcome: Progressing Goal: Cardiovascular complication will be avoided Outcome: Progressing   Problem: Activity: Goal: Risk for activity intolerance will decrease Outcome: Progressing

## 2019-07-27 NOTE — Anesthesia Postprocedure Evaluation (Signed)
Anesthesia Post Note  Patient: JAKEL CROUCH  Procedure(s) Performed: INCISION AND DRAINAGE ABSCESS (N/A Scrotum) INCISION AND DRAINAGE ABSCESS (N/A )  Patient location during evaluation: PACU Anesthesia Type: General Level of consciousness: awake and alert Pain management: pain level controlled Vital Signs Assessment: post-procedure vital signs reviewed and stable Respiratory status: spontaneous breathing, nonlabored ventilation, respiratory function stable and patient connected to nasal cannula oxygen Cardiovascular status: blood pressure returned to baseline and stable Postop Assessment: no apparent nausea or vomiting Anesthetic complications: no     Last Vitals:  Vitals:   07/26/19 2220 07/26/19 2230  BP: (!) 136/95 (!) 141/90  Pulse: 77 75  Resp: 20 18  Temp:  36.8 C  SpO2: 97% 98%    Last Pain:  Vitals:   07/27/19 0544  TempSrc:   PainSc: 9                  Priscilla Kirstein K Theodore Virgin

## 2019-07-27 NOTE — Consult Note (Signed)
Subjective:   CC: gluteal abscess  HPI:  Caleb Brooks is a 59 y.o. male who was consulted by [facility] for evaluation of above.  First noted a few days ago.  Symptoms include: Pain is sharp, worsening.  Exacerbated by touch.  Alleviated by nothing specific.  Associated with enlarging lump in the perineal/gluteal area.     Past Medical History:  has a past medical history of Anxiety, Arthritis, and Hypertension.  Past Surgical History:  has a past surgical history that includes Cyst removal trunk; Incision and drainage abscess (N/A, 07/26/2019); and Incision and drainage abscess (N/A, 07/26/2019).  Family History: family history includes Bipolar disorder in his brother; CVA in his sister; Congestive Heart Failure in his father; Diabetes in his father; Hypertension in his father and sister; Schizophrenia in his brother.  Social History:  reports that he has been smoking cigarettes. He has been smoking about 0.50 packs per day. He has never used smokeless tobacco. He reports current alcohol use. He reports current drug use. Drug: Marijuana.  Current Medications:  Medications Prior to Admission  Medication Sig Dispense Refill  . celecoxib (CELEBREX) 100 MG capsule TAKE 1 CAPSULE BY MOUTH twice a day as needed (Patient taking differently: Take 100 mg by mouth 2 (two) times daily. TAKE 1 CAPSULE BY MOUTH twice a day as needed) 30 capsule 0  . gabapentin (NEURONTIN) 100 MG capsule Take 1 capsule (100 mg total) by mouth 2 (two) times daily. Take 1 capsule (100 mg total) by mouth 2 times daily as needed 60 capsule 1  . Benzoyl Peroxide (BENZOYL PEROXIDE) 10 % LIQD Apply 148 mLs topically daily. (Patient not taking: Reported on 07/26/2019) 1 g 0  . terbinafine (LAMISIL) 1 % cream Apply 1 application topically 2 (two) times daily. 30 g 0    Allergies:  Allergies  Allergen Reactions  . Ibuprofen Other (See Comments)    Stomach upset    ROS:  General: Denies weight loss, weight gain, fatigue, fevers,  chills, and night sweats. Eyes: Denies blurry vision, double vision, eye pain, itchy eyes, and tearing. Ears: Denies hearing loss, earache, and ringing in ears. Nose: Denies sinus pain, congestion, infections, runny nose, and nosebleeds. Mouth/throat: Denies hoarseness, sore throat, bleeding gums, and difficulty swallowing. Heart: Denies chest pain, palpitations, racing heart, irregular heartbeat, leg pain or swelling, and decreased activity tolerance. Respiratory: Denies breathing difficulty, shortness of breath, wheezing, cough, and sputum. GI: Denies change in appetite, heartburn, nausea, vomiting, constipation, diarrhea, and blood in stool. GU: Denies difficulty urinating, pain with urinating, urgency, frequency, blood in urine. Musculoskeletal: Denies joint stiffness, pain, swelling, muscle weakness. Skin: Denies rash, itching, mass, tumors, sores, and boils Neurologic: Denies headache, fainting, dizziness, seizures, numbness, and tingling. Psychiatric: Denies depression, anxiety, difficulty sleeping, and memory loss. Endocrine: Denies heat or cold intolerance, and increased thirst or urination. Blood/lymph: Denies easy bruising, easy bruising, and swollen glands     Objective:     BP (!) 141/90 (BP Location: Left Arm)   Pulse 75   Temp 98.2 F (36.8 C) (Oral)   Resp 18   Ht 5\' 11"  (1.803 m)   Wt 80.7 kg   SpO2 98%   BMI 24.83 kg/m   Constitutional :  alert, cooperative, appears stated age and no distress  Lymphatics/Throat:  no asymmetry, masses, or scars  Respiratory:  clear to auscultation bilaterally  Cardiovascular:  regular rate and rhythm  Gastrointestinal: soft, non-tender; bowel sounds normal; no masses,  no organomegaly.  Musculoskeletal: Steady gait  and movement  Skin: Cool and moist,indurated area on right gluteal region extending into perineum and right scrotum, consistent with abscess.   Psychiatric: Normal affect, non-agitated, not confused       LABS:   CMP Latest Ref Rng & Units 07/26/2019 01/25/2019 09/04/2018  Glucose 70 - 99 mg/dL 101(H) 83 86  BUN 6 - 20 mg/dL 11 10 16   Creatinine 0.61 - 1.24 mg/dL 0.93 1.08 1.03  Sodium 135 - 145 mmol/L 138 141 138  Potassium 3.5 - 5.1 mmol/L 3.6 4.2 3.4(L)  Chloride 98 - 111 mmol/L 103 104 107  CO2 22 - 32 mmol/L 26 25 22   Calcium 8.9 - 10.3 mg/dL 8.8(L) 9.4 8.7(L)  Total Protein 6.5 - 8.1 g/dL 7.9 8.0 7.6  Total Bilirubin 0.3 - 1.2 mg/dL 0.8 0.2 0.6  Alkaline Phos 38 - 126 U/L 102 123(H) 116  AST 15 - 41 U/L 17 17 21   ALT 0 - 44 U/L 13 10 19    CBC Latest Ref Rng & Units 07/26/2019 01/25/2019 09/04/2018  WBC 4.0 - 10.5 K/uL 8.8 4.2 5.1  Hemoglobin 13.0 - 17.0 g/dL 15.0 15.7 14.4  Hematocrit 39.0 - 52.0 % 45.4 46.3 42.9  Platelets 150 - 400 K/uL 228 227 186    RADS: CLINICAL DATA:  59 year old with cellulitis involving the scrotum extending into the perineal region.  EXAM: CT PELVIS WITH CONTRAST  TECHNIQUE: Multidetector CT imaging of the pelvis was performed using the standard protocol following the bolus administration of intravenous contrast.  CONTRAST:  147mL OMNIPAQUE IOHEXOL 300 MG/ML IV.  COMPARISON:  None.  FINDINGS: Urinary Tract: Visualized lower poles of both kidneys normal in appearance. 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  Bowel: Visualized small bowel and colon unremarkable. Normal appearing appendix in the RIGHT upper pelvis.  Vascular/Lymphatic: Mild-to-moderate atherosclerosis involving the distal abdominal aorta and the iliofemoral arteries bilaterally.  Multiple upper normal-sized enhancing BILATERAL superficial inguinal lymph nodes. No deep pelvic lymphadenopathy.  Reproductive: Normal-appearing prostate gland. Calcifications involving the otherwise normal appearing seminal vesicles. (See below).  Other: Edema in the subcutaneous tissues of the RIGHT buttock,  with an associated fluid collection with a thick enhancing wall (indicating abscess) at the gluteal fold and extending upward into the RIGHT hemiscrotum. The gluteal fold abscess measures in total approximately 7.0 x 3.2 x 4.4 cm with the fluid component measuring approximately 3.9 x 2.0 x 3.1 cm. The scrotal abscess component measures approximately 7.9 x 2.7 x 3.9 cm. No associated soft tissue gas to confirm a diagnosis of Fournier's gangrene currently.  Musculoskeletal: Degenerative disc disease at L4-5. No acute findings.  IMPRESSION: 1. Abscess involving the RIGHT gluteal fold and extending upward into the RIGHT hemiscrotum. The gluteal fold abscess measures approximately 7.0 x 3.2 x 4.4 cm. The scrotal component measures approximately 7.9 x 2.7 x 3.9 cm. 2. Multiple upper normal-sized enhancing BILATERAL superficial inguinal lymph nodes, likely reactive. 3. 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  Aortic Atherosclerosis (ICD10-I70.0).   Electronically Signed   By: Evangeline Dakin M.D.   On: 07/26/2019 17:01  Assessment:     perineal/gluteal abscess  Plan:     1. Recommend urgent I&D. Alternatives include continued observation.  Benefits include possible symptom relief, pathologic evaluation, improved cosmesis. Discussed the risk of surgery including recurrence, chronic pain, post-op infxn, poor cosmesis, poor/delayed wound healing, and possible re-operation to  address said risks. The risks of general anesthetic, if used, includes MI, CVA, sudden death or even reaction to anesthetic medications also discussed.  Typical post-op recovery time of 3-5 days with possible activity restrictions were also discussed.  The patient verbalized understanding and all questions were answered to the patient's satisfaction.  Joint case with urology

## 2019-07-27 NOTE — Discharge Instructions (Signed)
You will be coming to clinic Friday for your packing exchange.  Over the weekend, he will need to have someone help you exchange your packing.  You have been provided with packing tape and other supplies upon discharge.  Recommend premedicating yourself with oxycodone prior to dressing changes.  Would remove packing prior to shower, dry yourself well and then replace packing daily.  He also have a drain.  This will be removed by Dr. Lysle Pearl in about 2 weeks.    Skin Abscess  A skin abscess is an infected area on or under your skin that contains a collection of pus and other material. An abscess may also be called a furuncle, carbuncle, or boil. An abscess can occur in or on almost any part of your body. Some abscesses break open (rupture) on their own. Most continue to get worse unless they are treated. The infection can spread deeper into the body and eventually into your blood, which can make you feel ill. Treatment usually involves draining the abscess. What are the causes? An abscess occurs when germs, like bacteria, pass through your skin and cause an infection. This may be caused by:  A scrape or cut on your skin.  A puncture wound through your skin, including a needle injection or insect bite.  Blocked oil or sweat glands.  Blocked and infected hair follicles.  A cyst that forms beneath your skin (sebaceous cyst) and becomes infected. What increases the risk? This condition is more likely to develop in people who:  Have a weak body defense system (immune system).  Have diabetes.  Have dry and irritated skin.  Get frequent injections or use illegal IV drugs.  Have a foreign body in a wound, such as a splinter.  Have problems with their lymph system or veins. What are the signs or symptoms? Symptoms of this condition include:  A painful, firm bump under the skin.  A bump with pus at the top. This may break through the skin and drain. Other symptoms include:  Redness  surrounding the abscess site.  Warmth.  Swelling of the lymph nodes (glands) near the abscess.  Tenderness.  A sore on the skin. How is this diagnosed? This condition may be diagnosed based on:  A physical exam.  Your medical history.  A sample of pus. This may be used to find out what is causing the infection.  Blood tests.  Imaging tests, such as an ultrasound, CT scan, or MRI. How is this treated? A small abscess that drains on its own may not need treatment. Treatment for larger abscesses may include:  Moist heat or heat pack applied to the area several times a day.  A procedure to drain the abscess (incision and drainage).  Antibiotic medicines. For a severe abscess, you may first get antibiotics through an IV and then change to antibiotics by mouth. Follow these instructions at home: Medicines   Take over-the-counter and prescription medicines only as told by your health care provider.  If you were prescribed an antibiotic medicine, take it as told by your health care provider. Do not stop taking the antibiotic even if you start to feel better. Abscess care   If you have an abscess that has not drained, apply heat to the affected area. Use the heat source that your health care provider recommends, such as a moist heat pack or a heating pad. ? Place a towel between your skin and the heat source. ? Leave the heat on for 20-30 minutes. ?  Remove the heat if your skin turns bright red. This is especially important if you are unable to feel pain, heat, or cold. You may have a greater risk of getting burned.  Follow instructions from your health care provider about how to take care of your abscess. Make sure you: ? Cover the abscess with a bandage (dressing). ? Change your dressing or gauze as told by your health care provider. ? Wash your hands with soap and water before you change the dressing or gauze. If soap and water are not available, use hand sanitizer.  Check  your abscess every day for signs of a worsening infection. Check for: ? More redness, swelling, or pain. ? More fluid or blood. ? Warmth. ? More pus or a bad smell. General instructions  To avoid spreading the infection: ? Do not share personal care items, towels, or hot tubs with others. ? Avoid making skin contact with other people.  Keep all follow-up visits as told by your health care provider. This is important. Contact a health care provider if you have:  More redness, swelling, or pain around your abscess.  More fluid or blood coming from your abscess.  Warm skin around your abscess.  More pus or a bad smell coming from your abscess.  A fever.  Muscle aches.  Chills or a general ill feeling. Get help right away if you:  Have severe pain.  See red streaks on your skin spreading away from the abscess. Summary  A skin abscess is an infected area on or under your skin that contains a collection of pus and other material.  A small abscess that drains on its own may not need treatment.  Treatment for larger abscesses may include having a procedure to drain the abscess and taking an antibiotic. This information is not intended to replace advice given to you by your health care provider. Make sure you discuss any questions you have with your health care provider. Document Revised: 09/15/2018 Document Reviewed: 07/08/2017 Elsevier Patient Education  2020 [employer].

## 2019-07-27 NOTE — Telephone Encounter (Signed)
App made spoke with patient

## 2019-07-27 NOTE — Consult Note (Signed)
Pharmacy Antibiotic Note  Caleb Brooks is a 58 y.o. male admitted on 07/26/2019 with cellulitis.  Pharmacy has been consulted for Zosyn dosing.  Plan: Zosyn 3.375g IV q8h (4 hour infusion). (will give first dose over 30 min to load - also - pt likely to discharge after 1 dose)  Height: 5\' 11"  (180.3 cm) Weight: 178 lb (80.7 kg) IBW/kg (Calculated) : 75.3  Temp (24hrs), Avg:97.9 F (36.6 C), Min:97 F (36.1 C), Max:98.6 F (37 C)  Recent Labs  Lab 07/26/19 1404  WBC 8.8  CREATININE 0.93    Estimated Creatinine Clearance: 93.3 mL/min (by C-G formula based on SCr of 0.93 mg/dL).    Allergies  Allergen Reactions  . Ibuprofen Other (See Comments)    Stomach upset    Antimicrobials this admission: Zosyn 2/17 >>  Dose adjustments this admission: None  Microbiology results: 2/17 BCx: No growth 2/17 Scrotal abcess: Moderate GPC's, few GPR's   Thank you for allowing pharmacy to be a part of this patient's care.  Lu Duffel, PharmD, BCPS Clinical Pharmacist 07/27/2019 11:45 AM

## 2019-07-27 NOTE — Op Note (Signed)
Preoperative diagnosis: gluteal/perineal abscess Postoperative diagnosis: same  Procedure: Incision and drainage of gluteal/perineal abscess  Anesthesia: general  Surgeon: Zenon Mayo  Wound Classification: Contaminated  Indications: Patient is a 59 y.o. male  presented with above.  See H&P for further details.  Specimen: wound culture  Complications: None  Estimated Blood Loss: minimal   Findings:  1. Abscess as suspected 2. purulent secretions drained and cultured 3. Adequate hemostasis.   Description of procedure: The patient was placed in the lithotomy position and general anesthesia was induced. The area was prepped and draped in the usual sterile fashion. A timeout was completed verifying correct patient, procedure, site, positioning, and implant(s) and/or special equipment prior to beginning this procedure.  Dr. Erlene Quan started case and I placed a counter incision after initial incision made by her.  Please see her op note for details.  Palpation of the wound through initial incision noted tunneling toward the right gluteal area consistent with CT scan, and palpable area of induration noted.  A counter Inicision made and additional purulent secretions was drained prior to placing a penrose drain to facilitate further drainage from the area. The penrose drain was secure as a loop through the two incisions by tying the ends together with 3-0 silk.  With a hemostat blunt dissection of septas performed to drain the abscess completely. Additional inspection noted no additional loculations or tunneling in the area.  Dr. Erlene Quan then resumed and completed the procedure.

## 2019-07-28 ENCOUNTER — Other Ambulatory Visit: Payer: Self-pay

## 2019-07-28 ENCOUNTER — Ambulatory Visit (INDEPENDENT_AMBULATORY_CARE_PROVIDER_SITE_OTHER): Payer: Self-pay | Admitting: Physician Assistant

## 2019-07-28 ENCOUNTER — Encounter: Payer: Self-pay | Admitting: Physician Assistant

## 2019-07-28 VITALS — BP 172/106 | HR 64 | Ht 71.0 in | Wt 178.0 lb

## 2019-07-28 DIAGNOSIS — N492 Inflammatory disorders of scrotum: Secondary | ICD-10-CM

## 2019-07-28 DIAGNOSIS — L732 Hidradenitis suppurativa: Secondary | ICD-10-CM

## 2019-07-28 NOTE — Progress Notes (Signed)
07/28/2019 4:42 PM   Caleb Brooks 23-Oct-1960 [id number]  CC: Postop wound check and dressing change  HPI: Caleb Brooks is a 59 y.o. male who presents today for postop wound check and dressing change.  He is POD 2 from scrotal and perineal abscess I&D with washout with Drs. Erlene Quan and [place] requiring placement of 1/4 inch Penrose drain.  Intraoperative findings notable for dependent right hemiscrotal abscess tunneling to the right buttock.  He originally presented to the ED on 07/26/2019 reporting a 3-day history of an abscess of the right perineum behind the scrotum with associated pain.  Admission CT notable for right gluteal fold and hemiscrotal abscess.  Today, he reports surgical site pain.  He reports that he premedicated for today's appointment with 5 tablets of 5 mg oxycodone and that he continues to have pain despite this.  He states he has not started taking his prescribed Bactrim yet.  Admission blood cultures pending with no growth at 2 days.  Abscess culture pending with moderate WBCs, predominantly PMNs, moderate gram-positive cocci, and few gram-positive rods.  He does report a history of scrotal abscesses requiring I&D.  Inguinal scarring noted on physical exam today consistent with prior I&D's.  He reports that he smokes cigarettes.  He states he has never seen a provider for management of his recurrent scrotal abscesses, however he states he is familiar with diagnosis of hidradenitis.  PMH: Past Medical History:  Diagnosis Date  . Anxiety   . Arthritis   . Hypertension     Surgical History: Past Surgical History:  Procedure Laterality Date  . CYST REMOVAL TRUNK     Groin area around 2000  . INCISION AND DRAINAGE ABSCESS N/A 07/26/2019   Procedure: INCISION AND DRAINAGE ABSCESS;  Surgeon: Hollice Espy, MD;  Location: [facility] ORS;  Service: Urology;  Laterality: N/A;  . INCISION AND DRAINAGE ABSCESS N/A 07/26/2019   Procedure: INCISION AND DRAINAGE ABSCESS;   Surgeon: Benjamine Sprague, DO;  Location: [facility] ORS;  Service: General;  Laterality: N/A;    Home Medications:  Allergies as of 07/28/2019      Reactions   Ibuprofen Other (See Comments)   Stomach upset      Medication List       Accurate as of July 28, 2019  4:42 PM. If you have any questions, ask your nurse or doctor.        benzoyl peroxide 10 % Liqd Generic drug: Benzoyl Peroxide Apply 148 mLs topically daily.   celecoxib 100 MG capsule Commonly known as: CeleBREX TAKE 1 CAPSULE BY MOUTH twice a day as needed What changed:   how much to take  how to take this  when to take this   gabapentin 100 MG capsule Commonly known as: NEURONTIN Take 1 capsule (100 mg total) by mouth 2 (two) times daily. Take 1 capsule (100 mg total) by mouth 2 times daily as needed   oxyCODONE 5 MG immediate release tablet Commonly known as: Roxicodone Take 1 tablet (5 mg total) by mouth every 8 (eight) hours as needed. Take 3 tablets 45 min prior to dressing change   sulfamethoxazole-trimethoprim 400-80 MG tablet Commonly known as: Bactrim Take 1 tablet by mouth 2 (two) times daily.   terbinafine 1 % cream Commonly known as: LAMISIL Apply 1 application topically 2 (two) times daily.       Allergies:  Allergies  Allergen Reactions  . Ibuprofen Other (See Comments)    Stomach upset    Family History:  Family History  Problem Relation Age of Onset  . Diabetes Father   . Hypertension Father   . Congestive Heart Failure Father   . Hypertension Sister   . CVA Sister   . Bipolar disorder Brother   . Schizophrenia Brother     Social History:   reports that he has been smoking cigarettes. He has been smoking about 0.50 packs per day. He has never used smokeless tobacco. He reports current alcohol use. He reports current drug use. Drug: Marijuana.  Physical Exam: BP (!) 172/106   Pulse 64   Ht 5\' 11"  (1.803 m)   Wt 178 lb (80.7 kg)   BMI 24.83 kg/m   Constitutional:  Alert  and oriented, no acute distress, nontoxic appearing HEENT: [place], AT Cardiovascular: No clubbing, cyanosis, or edema Respiratory: Normal respiratory effort, no increased work of breathing GU: Right dependent hemiscrotal surgical incision noted with healthy granulation tissue at the wound bed.  Penrose drain noted tracking to the right buttock and sutured together in a loop formation. Skin: No rashes, bruises or suspicious lesions Neurologic: Grossly intact, no focal deficits, moving all 4 extremities Psychiatric: Disinhibited speech  Laboratory Data: Results for orders placed or performed during the hospital encounter of 07/26/19  Culture, blood (routine x 2)   Specimen: BLOOD  Result Value Ref Range   Specimen Description BLOOD RIGHT ANTECUBITAL    Special Requests      BOTTLES DRAWN AEROBIC AND ANAEROBIC Blood Culture results may not be optimal due to an excessive volume of blood received in culture bottles   Culture      NO GROWTH 2 DAYS Performed at [facility], [address], [place], [place] [address]    Report Status PENDING   Culture, blood (routine x 2)   Specimen: BLOOD  Result Value Ref Range   Specimen Description BLOOD LEFT ANTECUBITAL    Special Requests      BOTTLES DRAWN AEROBIC AND ANAEROBIC Blood Culture results may not be optimal due to an excessive volume of blood received in culture bottles   Culture      NO GROWTH 2 DAYS Performed at [facility], [address] [place], [place] [address]    Report Status PENDING   Respiratory Panel by RT PCR (Flu A&B, Covid) - Nasopharyngeal Swab   Specimen: Nasopharyngeal Swab  Result Value Ref Range   SARS Coronavirus 2 by RT PCR NEGATIVE NEGATIVE   Influenza A by PCR NEGATIVE NEGATIVE   Influenza B by PCR NEGATIVE NEGATIVE  Aerobic/Anaerobic Culture (surgical/deep wound)   Specimen: PATH Other; Abscess  Result Value Ref Range   Specimen Description      ABSCESS Performed at [facility], [address] [place], [place] [address]    Special Requests      SCROTAL ABSCESS Performed at [facility], [address] [place], [place] [address]    Gram Stain      MODERATE WBC PRESENT, PREDOMINANTLY PMN MODERATE GRAM POSITIVE COCCI FEW GRAM POSITIVE RODS    Culture      CULTURE REINCUBATED FOR BETTER GROWTH HOLDING FOR POSSIBLE ANAEROBE Performed at [facility], [place] [address], [place],  [address]    Report Status PENDING   CBC  Result Value Ref Range   WBC 8.8 4.0 - 10.5 K/uL   RBC 4.82 4.22 - 5.81 MIL/uL   Hemoglobin 15.0 13.0 - 17.0 g/dL   HCT 45.4 39.0 - 52.0 %   MCV 94.2  80.0 - 100.0 fL   MCH 31.1 26.0 - 34.0 pg   MCHC 33.0 30.0 - 36.0 g/dL   RDW 12.6 11.5 - 15.5 %   Platelets 228 150 - 400 K/uL   nRBC 0.0 0.0 - 0.2 %  Comprehensive metabolic panel  Result Value Ref Range   Sodium 138 135 - 145 mmol/L   Potassium 3.6 3.5 - 5.1 mmol/L   Chloride 103 98 - 111 mmol/L   CO2 26 22 - 32 mmol/L   Glucose, Bld 101 (H) 70 - 99 mg/dL   BUN 11 6 - 20 mg/dL   Creatinine, Ser 0.93 0.61 - 1.24 mg/dL   Calcium 8.8 (L) 8.9 - 10.3 mg/dL   Total Protein 7.9 6.5 - 8.1 g/dL   Albumin 3.6 3.5 - 5.0 g/dL   AST 17 15 - 41 U/L   ALT 13 0 - 44 U/L   Alkaline Phosphatase 102 38 - 126 U/L   Total Bilirubin 0.8 0.3 - 1.2 mg/dL   GFR calc non Af Amer >60 >60 mL/min   GFR calc Af Amer >60 >60 mL/min   Anion gap 9 5 - 15  HIV Antibody (routine testing w rflx)  Result Value Ref Range   HIV Screen 4th Generation wRfx NON REACTIVE NON REACTIVE   Assessment & Plan:   1. Scrotal abscess 59 year old male POD 2 from right hemiscrotal and gluteal abscess I&D with washout presents today for wound recheck and dressing change.  I removed iodoform packing in its entirety, irrigated the wound bed with sterile saline, and visually confirmed the presence of healthy granulation tissue along the wound bed.  I then replaced the iodoform packing with a length of  approximately 20 cm and recovered the wound with a 4 x 4 sterile gauze pad and an abdominal pad.  Notably, patient struggled to visualize the dependent right hemiscrotal incision.  Additionally, he struggled to tolerate the dressing change.  Based on this, I am concerned about his ability to complete daily dressing changes at home.  Rather, I advised him to remove an approximately 3 cm length of iodoform packing from his scrotal incision on a daily basis, leaving a tail protruding from the wound for later visualization.  I provided him with a mirror to assist in visualization for this purpose.  Additionally, I pointed out his Penrose drain and counseled him not to manipulate this.  He expressed understanding.  Patient had amply premedicated in advance of his appointment today.  He did confirm that he had a driver with him today to take him home.  We will plan to keep Penrose drain in place with ultimate removal by general surgery in approximately 2 weeks.  I would like to see him back in clinic in 1 week for packing replacement and wound recheck.  I counseled him to contact the office if the packing falls out early so that we can replace it as needed.  He expressed understanding.  I sent him home today with additional 4 x 4 sterile gauze and abdominal pads and with strict instructions to pick up his prescribed Bactrim and started immediately.  2. Hidradenitis suppurativa Recommend outpatient consult to dermatology for management of likely hidradenitis suppurativa.  I counseled him that I recommend tobacco cessation at this time.  He is resistant to this.  Return in about 1 week (around 08/04/2019) for Wound recheck.  Debroah Loop, PA-C  [facility] [address] [place], [place] [address] [phone]

## 2019-07-30 LAB — AEROBIC/ANAEROBIC CULTURE W GRAM STAIN (SURGICAL/DEEP WOUND)

## 2019-07-31 LAB — CULTURE, BLOOD (ROUTINE X 2)
Culture: NO GROWTH
Culture: NO GROWTH

## 2019-08-01 ENCOUNTER — Other Ambulatory Visit: Payer: Self-pay

## 2019-08-01 ENCOUNTER — Encounter: Payer: Self-pay | Admitting: Physician Assistant

## 2019-08-01 ENCOUNTER — Telehealth: Payer: Self-pay | Admitting: Physician Assistant

## 2019-08-01 ENCOUNTER — Ambulatory Visit (INDEPENDENT_AMBULATORY_CARE_PROVIDER_SITE_OTHER): Payer: Self-pay | Admitting: Physician Assistant

## 2019-08-01 VITALS — BP 124/81 | HR 67 | Ht 72.0 in | Wt 187.0 lb

## 2019-08-01 DIAGNOSIS — N492 Inflammatory disorders of scrotum: Secondary | ICD-10-CM

## 2019-08-01 NOTE — Telephone Encounter (Signed)
Patient called the office this morning and left a voice mail on the clinical triage line.  He states that the packing in his scrotum has come out and it is draining a little bit.    Per Sam, patient should come into the office and she will assist him in repacking the wound.    Appt made and patient notified.

## 2019-08-01 NOTE — Patient Instructions (Signed)
Supplies you will need: Sterile gauze Sterile saline Dry dressing as desired  Daily dressing change procedure: 1. Get in the shower and allow the water to run over your wound and wet the gauze that is packing it. 2. Remove the old gauze from the wound. 3. Continue with your normal shower routine. 4. Wet a new piece of sterile gauze with sterile saline and inserted into your wound. 5. Cover with a dry dressing. 6. Repeat this procedure daily.

## 2019-08-01 NOTE — Progress Notes (Signed)
08/01/2019 3:31 PM   Caleb Brooks [birth year] [id number]  CC: Wound recheck  HPI: Caleb Brooks is a 59 y.o. male who presents today for wound recheck.  He is POD6 from scrotal and perineal abscess I&D with washout with Drs. Erlene Quan and [place].  I last saw him in clinic 4 days ago for wound check and dressing change.  I irrigated his wound with sterile saline, repacked it with iodoform gauze, and instructed him to remove a length of approximately 2 to 3 cm daily until the dressing was removed in its entirety.  Today, he reports that his dressing came out in its entirety immediately after returning home that day.  He has not replaced the dressing since.  He has no other acute concerns.  PMH: Past Medical History:  Diagnosis Date  . Anxiety   . Arthritis   . Hypertension     Surgical History: Past Surgical History:  Procedure Laterality Date  . CYST REMOVAL TRUNK     Groin area around 2000  . INCISION AND DRAINAGE ABSCESS N/A 07/26/2019   Procedure: INCISION AND DRAINAGE ABSCESS;  Surgeon: Hollice Espy, MD;  Location: [facility] OR[facility];  Service: Urology;  Laterality: N/A;  . INCISION AND DRAINAGE ABSCESS N/A 07/26/2019   Procedure: INCISION AND DRAINAGE ABSCESS;  Surgeon: Benjamine Sprague, DO;  Location: [facility] OR[facility];  Service: General;  Laterality: N/A;    Home Medications:  Allergies as of 08/01/2019      Reactions   Ibuprofen Other (See Comments)   Stomach upset      Medication List       Accurate as of August 01, 2019  3:31 PM. If you have any questions, ask your nurse or doctor.        benzoyl peroxide 10 % Liqd Generic drug: Benzoyl Peroxide Apply 148 mLs topically daily.   celecoxib 100 MG capsule Commonly known as: CeleBREX TAKE 1 CAPSULE BY MOUTH twice a day as needed What changed:   how much to take  how to take this  when to take this   gabapentin 100 MG capsule Commonly known as: NEURONTIN Take 1 capsule (100 mg total) by mouth 2 (two) times daily. Take  1 capsule (100 mg total) by mouth 2 times daily as needed   oxyCODONE 5 MG immediate release tablet Commonly known as: Roxicodone Take 1 tablet (5 mg total) by mouth every 8 (eight) hours as needed. Take 3 tablets 45 min prior to dressing change   sulfamethoxazole-trimethoprim 400-80 MG tablet Commonly known as: Bactrim Take 1 tablet by mouth 2 (two) times daily.   terbinafine 1 % cream Commonly known as: LAMISIL Apply 1 application topically 2 (two) times daily.       Allergies:  Allergies  Allergen Reactions  . Ibuprofen Other (See Comments)    Stomach upset    Family History: Family History  Problem Relation Age of Onset  . Diabetes Father   . Hypertension Father   . Congestive Heart Failure Father   . Hypertension Sister   . CVA Sister   . Bipolar disorder Brother   . Schizophrenia Brother     Social History:   reports that he has been smoking cigarettes. He has been smoking about 0.50 packs per day. He has never used smokeless tobacco. He reports current alcohol use. He reports current drug use. Drug: Marijuana.  Physical Exam: BP 124/81   Pulse 67   Ht 6' (1.829 m)   Wt 187 lb (84.8 kg)  BMI 25.36 kg/m   Constitutional:  Alert and oriented, no acute distress, nontoxic appearing HEENT: [place], AT Cardiovascular: No clubbing, cyanosis, or edema Respiratory: Normal respiratory effort, no increased work of breathing GU: Looped Penrose drain remains in place and sutured together.  Open I&D site visible with healthy granulation tissue across the wound bed.  No purulence or malodorous output. Skin: No rashes, bruises or suspicious lesions Neurologic: Grossly intact, no focal deficits, moving all 4 extremities Psychiatric: Normal mood and affect  Assessment & Plan:   1. Scrotal abscess I irrigated his wound again with sterile saline today and subsequently placed a 4 x 4 gauze wet-to-dry dressing inside the scrotal incision.  I counseled the patient to replace his  wet-to-dry dressing once daily to encourage healing by secondary intent.  He expressed understanding.  I provided him written instructions to do so.  Return if symptoms worsen or fail to improve.  Debroah Loop, PA-C  [facility] [address], [place], [place] [address] [phone]

## 2019-08-04 ENCOUNTER — Ambulatory Visit: Payer: Self-pay | Admitting: Physician Assistant

## 2019-08-16 ENCOUNTER — Ambulatory Visit: Payer: Self-pay | Admitting: Gerontology

## 2019-08-16 ENCOUNTER — Other Ambulatory Visit: Payer: Self-pay

## 2019-08-16 ENCOUNTER — Encounter: Payer: Self-pay | Admitting: Gerontology

## 2019-08-16 VITALS — BP 136/89 | HR 70 | Temp 97.3°F | Ht 71.0 in | Wt 179.9 lb

## 2019-08-16 DIAGNOSIS — N492 Inflammatory disorders of scrotum: Secondary | ICD-10-CM

## 2019-08-16 NOTE — Progress Notes (Signed)
Established Patient Office Visit  Subjective:  Patient ID: OSKAR MANRIQUEZ, male    DOB: 1960-06-21  Age: 59 y.o. MRN: [id number]  CC:  Chief Complaint  Patient presents with  . Follow-up    HPI VERBON ALSBROOKS presents for follow up of scrotal abscess. He states that he is unable to visualize if his incision is healing, though it continues to drain minimal amount of purulent exudate through the penrose drain.  He states that he completed the course of Antibiotics, performs dressing change daily as ordered. He states that he continues to experience intermittent dull 5/10 pain to the incision site. He denies fever and chills. Overall, he states that he's doing well and offers no further complaint.  Past Medical History:  Diagnosis Date  . Anxiety   . Arthritis   . Hypertension     Past Surgical History:  Procedure Laterality Date  . CYST REMOVAL TRUNK     Groin area around 2000  . INCISION AND DRAINAGE ABSCESS N/A 07/26/2019   Procedure: INCISION AND DRAINAGE ABSCESS;  Surgeon: Hollice Espy, MD;  Location: [facility] OR[facility];  Service: Urology;  Laterality: N/A;  . INCISION AND DRAINAGE ABSCESS N/A 07/26/2019   Procedure: INCISION AND DRAINAGE ABSCESS;  Surgeon: Benjamine Sprague, DO;  Location: [facility] OR[facility];  Service: General;  Laterality: N/A;    Family History  Problem Relation Age of Onset  . Diabetes Father   . Hypertension Father   . Congestive Heart Failure Father   . Hypertension Sister   . CVA Sister   . Bipolar disorder Brother   . Schizophrenia Brother     Social History   Socioeconomic History  . Marital status: Single    Spouse name: Not on file  . Number of children: Not on file  . Years of education: Not on file  . Highest education level: Not on file  Occupational History  . Not on file  Tobacco Use  . Smoking status: Current Every Day Smoker    Packs/day: 0.50    Types: Cigarettes  . Smokeless tobacco: Never Used  . Tobacco comment: 5-10 cigarettes a day   Substance and Sexual Activity  . Alcohol use: Yes    Comment: 32 oz a day  . Drug use: Yes    Types: Marijuana  . Sexual activity: Not on file  Other Topics Concern  . Not on file  Social History Narrative  . Not on file   Social Determinants of Health   Financial Resource Strain:   . Difficulty of Paying Living Expenses: Not on file  Food Insecurity:   . Worried About Charity fundraiser in the Last Year: Not on file  . Ran Out of Food in the Last Year: Not on file  Transportation Needs:   . Lack of Transportation (Medical): Not on file  . Lack of Transportation (Non-Medical): Not on file  Physical Activity:   . Days of Exercise per Week: Not on file  . Minutes of Exercise per Session: Not on file  Stress:   . Feeling of Stress : Not on file  Social Connections:   . Frequency of Communication with Friends and Family: Not on file  . Frequency of Social Gatherings with Friends and Family: Not on file  . Attends Religious Services: Not on file  . Active Member of Clubs or Organizations: Not on file  . Attends Archivist Meetings: Not on file  . Marital Status: Not on file  Intimate Partner  Violence:   . Fear of Current or Ex-Partner: Not on file  . Emotionally Abused: Not on file  . Physically Abused: Not on file  . Sexually Abused: Not on file    Outpatient Medications Prior to Visit  Medication Sig Dispense Refill  . celecoxib (CELEBREX) 100 MG capsule TAKE 1 CAPSULE BY MOUTH twice a day as needed (Patient taking differently: Take 100 mg by mouth 2 (two) times daily. TAKE 1 CAPSULE BY MOUTH twice a day as needed) 30 capsule 0  . gabapentin (NEURONTIN) 100 MG capsule Take 1 capsule (100 mg total) by mouth 2 (two) times daily. Take 1 capsule (100 mg total) by mouth 2 times daily as needed 60 capsule 1  . Benzoyl Peroxide (BENZOYL PEROXIDE) 10 % LIQD Apply 148 mLs topically daily. (Patient not taking: Reported on 08/16/2019) 1 g 0  . terbinafine (LAMISIL) 1 %  cream Apply 1 application topically 2 (two) times daily. (Patient not taking: Reported on 08/16/2019) 30 g 0  . oxyCODONE (ROXICODONE) 5 MG immediate release tablet Take 1 tablet (5 mg total) by mouth every 8 (eight) hours as needed. Take 3 tablets 45 min prior to dressing change (Patient not taking: Reported on 08/16/2019) 15 tablet 0  . sulfamethoxazole-trimethoprim (BACTRIM) 400-80 MG tablet Take 1 tablet by mouth 2 (two) times daily. (Patient not taking: Reported on 08/16/2019) 20 tablet 0   No facility-administered medications prior to visit.    Allergies  Allergen Reactions  . Ibuprofen Other (See Comments)    Stomach upset    ROS Review of Systems  Constitutional: Negative.   Respiratory: Negative.   Cardiovascular: Negative.   Skin: Positive for wound (scrotal abscess ).  Neurological: Negative.       Objective:    Physical Exam  Constitutional: He is oriented to person, place, and time. He appears well-developed.  HENT:  Head: Normocephalic and atraumatic.  Cardiovascular: Normal rate and regular rhythm.  Pulmonary/Chest: Effort normal and breath sounds normal.  Neurological: He is alert and oriented to person, place, and time.  Skin:  Penrose drain is intact, abscess site is soft, and incision site is healing.    BP 136/89 (BP Location: Left Arm, Patient Position: Sitting, Cuff Size: Large)   Pulse 70   Temp (!) 97.3 F (36.3 C)   Ht 5\' 11"  (1.803 m)   Wt 179 lb 14.4 oz (81.6 kg)   SpO2 98%   BMI 25.09 kg/m  Wt Readings from Last 3 Encounters:  08/16/19 179 lb 14.4 oz (81.6 kg)  08/01/19 187 lb (84.8 kg)  07/28/19 178 lb (80.7 kg)     Health Maintenance Due  Topic Date Due  . Hepatitis C Screening  01/02/63  . TETANUS/TDAP  06/13/1981    There are no preventive care reminders to display for this patient.  Lab Results  Component Value Date   TSH 2.91 01/01/2015   Lab Results  Component Value Date   WBC 8.8 07/26/2019   HGB 15.0 07/26/2019    HCT 45.4 07/26/2019   MCV 94.2 07/26/2019   PLT 228 07/26/2019   Lab Results  Component Value Date   NA 138 07/26/2019   K 3.6 07/26/2019   CO2 26 07/26/2019   GLUCOSE 101 (H) 07/26/2019   BUN 11 07/26/2019   CREATININE 0.93 07/26/2019   BILITOT 0.8 07/26/2019   ALKPHOS 102 07/26/2019   AST 17 07/26/2019   ALT 13 07/26/2019   PROT 7.9 07/26/2019   ALBUMIN 3.6 07/26/2019  CALCIUM 8.8 (L) 07/26/2019   ANIONGAP 9 07/26/2019   Lab Results  Component Value Date   CHOL 141 01/25/2019   Lab Results  Component Value Date   HDL 60 01/25/2019   Lab Results  Component Value Date   LDLCALC 67 01/25/2019   Lab Results  Component Value Date   TRIG 68 01/25/2019   Lab Results  Component Value Date   CHOLHDL 2.4 01/25/2019   Lab Results  Component Value Date   HGBA1C 5.4 01/25/2019      Assessment & Plan:   1. Scrotal abscess - He was advised to continue dressing change as ordered and schedule a follow up appointment with Urologist for Penrose drain removal. He was provided with more 4 x 4 gauze. He was advised to notify clinic with worsening symptoms.     Follow-up: Return in about 6 weeks (around 09/27/2019), or if symptoms worsen or fail to improve.    Earnest Mcgillis Jerold Coombe, NP

## 2019-08-22 ENCOUNTER — Encounter: Payer: Self-pay | Admitting: Physician Assistant

## 2019-08-22 ENCOUNTER — Other Ambulatory Visit: Payer: Self-pay

## 2019-08-22 ENCOUNTER — Ambulatory Visit (INDEPENDENT_AMBULATORY_CARE_PROVIDER_SITE_OTHER): Payer: Self-pay | Admitting: Physician Assistant

## 2019-08-22 VITALS — BP 160/100 | HR 77 | Temp 97.4°F | Ht 71.0 in | Wt 178.0 lb

## 2019-08-22 DIAGNOSIS — N492 Inflammatory disorders of scrotum: Secondary | ICD-10-CM

## 2019-08-22 DIAGNOSIS — L732 Hidradenitis suppurativa: Secondary | ICD-10-CM

## 2019-08-22 NOTE — Progress Notes (Signed)
08/22/2019 2:00 PM   Caleb Brooks 16-Mar-1961 [id number]  CC: Wound recheck  HPI: Caleb Brooks is a 59 y.o. male who presents today for wound recheck s/p scrotal and perineal abscess I&D with washout on 07/26/2019.  A Penrose drain was placed intraoperatively for management of abscess tunneling to the right buttock.  He was previously counseled to follow-up with general surgery for removal of this, however this was not completed.  Today, he reports he has been doing wet-to-dry dressing changes at home as instructed.  He completed his prescribed course of Bactrim in the interim.  He does report some perineal discomfort secondary to his Penrose drain.  Additionally, he reports a possible new left scrotal/inguinal abscess with associated left scrotal edema and pain x1 week.  He states he thinks that the abscess "popped" as the edema and pain have significantly improved since yesterday.  As per my previous note on 07/28/2019, patient does have a history of scrotal abscesses requiring I&D with significant bilateral inguinal scarring on physical exam.  He smokes cigarettes.  He has not seen a provider for management of recurrent scrotal abscesses, however he is familiar with the diagnosis of hidradenitis.  He denies fever, chills, nausea, and vomiting.  He does report some night sweats.  PMH: Past Medical History:  Diagnosis Date  . Anxiety   . Arthritis   . Hypertension     Surgical History: Past Surgical History:  Procedure Laterality Date  . CYST REMOVAL TRUNK     Groin area around 2000  . INCISION AND DRAINAGE ABSCESS N/A 07/26/2019   Procedure: INCISION AND DRAINAGE ABSCESS;  Surgeon: Hollice Espy, MD;  Location: [facility];  Service: Urology;  Laterality: N/A;  . INCISION AND DRAINAGE ABSCESS N/A 07/26/2019   Procedure: INCISION AND DRAINAGE ABSCESS;  Surgeon: Benjamine Sprague, DO;  Location: [facility];  Service: General;  Laterality: N/A;    Home Medications:  Allergies as of  08/22/2019      Reactions   Ibuprofen Other (See Comments)   Stomach upset      Medication List       Accurate as of August 22, 2019  2:00 PM. If you have any questions, ask your nurse or doctor.        benzoyl peroxide 10 % Liqd Generic drug: Benzoyl Peroxide Apply 148 mLs topically daily.   celecoxib 100 MG capsule Commonly known as: CeleBREX TAKE 1 CAPSULE BY MOUTH twice a day as needed What changed:   how much to take  how to take this  when to take this   gabapentin 100 MG capsule Commonly known as: NEURONTIN Take 1 capsule (100 mg total) by mouth 2 (two) times daily. Take 1 capsule (100 mg total) by mouth 2 times daily as needed   terbinafine 1 % cream Commonly known as: LAMISIL Apply 1 application topically 2 (two) times daily.       Allergies:  Allergies  Allergen Reactions  . Ibuprofen Other (See Comments)    Stomach upset    Family History: Family History  Problem Relation Age of Onset  . Diabetes Father   . Hypertension Father   . Congestive Heart Failure Father   . Hypertension Sister   . CVA Sister   . Bipolar disorder Brother   . Schizophrenia Brother     Social History:   reports that he has been smoking cigarettes. He has been smoking about 0.50 packs per day. He has never used smokeless tobacco. He reports  current alcohol use. He reports current drug use. Drug: Marijuana.  Physical Exam: BP (!) 160/100 (BP Location: Left Arm, Patient Position: Sitting, Cuff Size: Large)   Pulse 77   Temp (!) 97.4 F (36.3 C) (Temporal)   Ht 5\' 11"  (1.803 m)   Wt 178 lb (80.7 kg)   BMI 24.83 kg/m   Constitutional:  Alert and oriented, no acute distress, nontoxic appearing HEENT: [place], AT Cardiovascular: No clubbing, cyanosis, or edema Respiratory: Normal respiratory effort, no increased work of breathing GU: Well-healing right scrotal I&D site with healthy granulation tissue visualized at the base.  Wound now measures approximately 1.5 x 1 cm and  approximately 1 cm in depth.  No purulence or crepitus noted.  Intact Penrose drain extending gluteally.  Diffuse left scrotal induration around the site of prior I&D's.  Left hemiscrotum slightly moist from apparent wound drainage, however unable to identify a source. Skin: No rashes, bruises or suspicious lesions Neurologic: Grossly intact, no focal deficits, moving all 4 extremities Psychiatric: Normal mood and affect  Assessment & Plan:   1. Scrotal abscess Healing well with at home wet-to-dry dressing changes.  Encouraged patient to continue these until wound heals in its entirety.  I used sterile forceps and scissors today to remove the gluteal Penrose drain without difficulty.  Patient tolerated well.  I was unable to locate a definitive new abscess of the left hemiscrotum today.  VSS, will defer further antibiotics at this time.  2. Hidradenitis suppurativa Reiterated my concerns today with the patient regarding a likely diagnosis of hidradenitis suppurativa.  I explained that I strongly recommend evaluation by dermatology for management of this.  I reiterated that smoking cessation will be critical in managing this condition and provided him with written information about the diagnosis.  He expressed understanding.  Ambulatory referral placed today. - Ambulatory referral to Dermatology   Return if symptoms worsen or fail to improve.  Debroah Loop, PA-C  [facility] [address] [place], [place] [address] [phone]

## 2019-08-22 NOTE — Patient Instructions (Signed)
Hidradenitis Suppurativa Hidradenitis suppurativa is a long-term (chronic) skin disease. It is similar to a severe form of acne, but it affects areas of the body where acne would be unusual, especially areas of the body where skin rubs against skin and becomes moist. These include:  Underarms.  Groin.  Genital area.  Buttocks.  Upper thighs.  Breasts. Hidradenitis suppurativa may start out as small lumps or pimples caused by blocked sweat glands or hair follicles. Pimples may develop into deep sores that break open (rupture) and drain pus. Over time, affected areas of skin may thicken and become scarred. This condition is rare and does not spread from person to person (non-contagious). What are the causes? The exact cause of this condition is not known. It may be related to:  Male and male hormones.  An overactive disease-fighting system (immune system). The immune system may over-react to blocked hair follicles or sweat glands and cause swelling and pus-filled sores. What increases the risk? You are more likely to develop this condition if you:  Are male.  Are 11-55 years old.  Have a family history of hidradenitis suppurativa.  Have a personal history of acne.  Are overweight.  Smoke.  Take the medicine lithium. What are the signs or symptoms? The first symptoms are usually painful bumps in the skin, similar to pimples. The condition may get worse over time (progress), or it may only cause mild symptoms. If the disease progresses, symptoms may include:  Skin bumps getting bigger and growing deeper into the skin.  Bumps rupturing and draining pus.  Itchy, infected skin.  Skin getting thicker and scarred.  Tunnels under the skin (fistulas) where pus drains from a bump.  Pain during daily activities, such as pain during walking if your groin area is affected.  Emotional problems, such as stress or depression. This condition may affect your appearance and your  ability or willingness to wear certain clothes or do certain activities. How is this diagnosed? This condition is diagnosed by a health care provider who specializes in skin diseases (dermatologist). You may be diagnosed based on:  Your symptoms and medical history.  A physical exam.  Testing a pus sample for infection.  Blood tests. How is this treated? Your treatment will depend on how severe your symptoms are. The same treatment will not work for everybody with this condition. You may need to try several treatments to find what works best for you. Treatment may include:  Cleaning and bandaging (dressing) your wounds as needed.  Lifestyle changes, such as new skin care routines.  Taking medicines, such as: ? Antibiotics. ? Acne medicines. ? Medicines to reduce the activity of the immune system. ? A diabetes medicine (metformin). ? Birth control pills, for women. ? Steroids to reduce swelling and pain.  Working with a mental health care provider, if you experience emotional distress due to this condition. If you have severe symptoms that do not get better with medicine, you may need surgery. Surgery may involve:  Using a laser to clear the skin and remove hair follicles.  Opening and draining deep sores.  Removing the areas of skin that are diseased and scarred. Follow these instructions at home: Medicines   Take over-the-counter and prescription medicines only as told by your health care provider.  If you were prescribed an antibiotic medicine, take it as told by your health care provider. Do not stop taking the antibiotic even if your condition improves. Skin care  If you have open wounds, cover   them with a clean dressing as told by your health care provider. Keep wounds clean by washing them gently with soap and water when you bathe.  Do not shave the areas where you get hidradenitis suppurativa.  Do not wear deodorant.  Wear loose-fitting clothes.  Try to avoid  getting overheated or sweaty. If you get sweaty or wet, change into clean, dry clothes as soon as you can.  To help relieve pain and itchiness, cover sore areas with a warm, clean washcloth (warm compress) for 5-10 minutes as often as needed.  If told by your health care provider, take a bleach bath twice a week: ? Fill your bathtub halfway with water. ? Pour in  cup of unscented household bleach. ? Soak in the tub for 5-10 minutes. ? Only soak from the neck down. Avoid water on your face and hair. ? Shower to rinse off the bleach from your skin. General instructions  Learn as much as you can about your disease so that you have an active role in your treatment. Work closely with your health care provider to find treatments that work for you.  If you are overweight, work with your health care provider to lose weight as recommended.  Do not use any products that contain nicotine or tobacco, such as cigarettes and e-cigarettes. If you need help quitting, ask your health care provider.  If you struggle with living with this condition, talk with your health care provider or work with a mental health care provider as recommended.  Keep all follow-up visits as told by your health care provider. This is important. Where to find more information  Hidradenitis Suppurativa Foundation, Inc.: https://www.hs-foundation.org/ Contact a health care provider if you have:  A flare-up of hidradenitis suppurativa.  A fever or chills.  Trouble controlling your symptoms at home.  Trouble doing your daily activities because of your symptoms.  Trouble dealing with emotional problems related to your condition. Summary  Hidradenitis suppurativa is a long-term (chronic) skin disease. It is similar to a severe form of acne, but it affects areas of the body where acne would be unusual.  The first symptoms are usually painful bumps in the skin, similar to pimples. The condition may get worse over time  (progress), or it may only cause mild symptoms.  If you have open wounds, cover them with a clean dressing as told by your health care provider. Keep wounds clean by washing them gently with soap and water when you bathe.  Besides skin care, treatment may include medicines, laser treatment, and surgery. This information is not intended to replace advice given to you by your health care provider. Make sure you discuss any questions you have with your health care provider. Document Revised: 06/02/2017 Document Reviewed: 06/02/2017 Elsevier Patient Education  2020 Elsevier Inc.  

## 2019-08-29 ENCOUNTER — Other Ambulatory Visit: Payer: Self-pay

## 2019-08-29 DIAGNOSIS — K6289 Other specified diseases of anus and rectum: Secondary | ICD-10-CM

## 2019-08-29 DIAGNOSIS — Z8739 Personal history of other diseases of the musculoskeletal system and connective tissue: Secondary | ICD-10-CM

## 2019-08-29 MED ORDER — CELECOXIB 100 MG PO CAPS: ORAL_CAPSULE | ORAL | 0 refills | Status: DC

## 2019-08-29 MED ORDER — GABAPENTIN 100 MG PO CAPS: 100.0000 mg | ORAL_CAPSULE | Freq: Two times a day (BID) | ORAL | 1 refills | Status: DC

## 2019-09-27 ENCOUNTER — Other Ambulatory Visit: Payer: Self-pay

## 2019-09-27 ENCOUNTER — Ambulatory Visit: Payer: Self-pay | Admitting: Gerontology

## 2019-09-27 ENCOUNTER — Encounter: Payer: Self-pay | Admitting: Gerontology

## 2019-09-27 VITALS — BP 143/93 | HR 74 | Temp 97.3°F | Ht 71.0 in | Wt 178.4 lb

## 2019-09-27 DIAGNOSIS — N492 Inflammatory disorders of scrotum: Secondary | ICD-10-CM

## 2019-09-27 DIAGNOSIS — K6289 Other specified diseases of anus and rectum: Secondary | ICD-10-CM

## 2019-09-27 DIAGNOSIS — Z20828 Contact with and (suspected) exposure to other viral communicable diseases: Secondary | ICD-10-CM

## 2019-09-27 DIAGNOSIS — Z8739 Personal history of other diseases of the musculoskeletal system and connective tissue: Secondary | ICD-10-CM

## 2019-09-27 MED ORDER — BACITRACIN-NEOMYCIN-POLYMYXIN OINTMENT TUBE: 1.0000 "application " | TOPICAL_OINTMENT | CUTANEOUS | 0 refills | Status: DC | PRN

## 2019-09-27 MED ORDER — CELECOXIB 100 MG PO CAPS: ORAL_CAPSULE | ORAL | 0 refills | Status: DC

## 2019-09-27 MED ORDER — GABAPENTIN 100 MG PO CAPS: 100.0000 mg | ORAL_CAPSULE | Freq: Two times a day (BID) | ORAL | 1 refills | Status: DC

## 2019-09-27 NOTE — Progress Notes (Signed)
Established Patient Office Visit  Subjective:  Patient ID: Caleb Brooks, male    DOB: [birth year]  Age: 59 y.o. MRN: [id number]  CC:  Chief Complaint  Patient presents with  . Follow-up    also wants to get tested for herpes     HPI Caleb Brooks presents for follow up of scrotal abscess and medication refill. He reports that his Penrose drain was removed on 08/24/2019. He states that the site has not completely healed and notices minimal purulent drainage. He states that site itches, denies pain to site. He reports that he cancelled his Dermatology appointment because his Financial assistance has not been approved. He states that he thinks he was exposed to Herpes and requests to be tested. Overall, he states that he's doing well and offers no further complaint.  Past Medical History:  Diagnosis Date  . Anxiety   . Arthritis   . Hypertension     Past Surgical History:  Procedure Laterality Date  . CYST REMOVAL TRUNK     Groin area around 2000  . INCISION AND DRAINAGE ABSCESS N/A 07/26/2019   Procedure: INCISION AND DRAINAGE ABSCESS;  Surgeon: Hollice Espy, MD;  Location: [facility];  Service: Urology;  Laterality: N/A;  . INCISION AND DRAINAGE ABSCESS N/A 07/26/2019   Procedure: INCISION AND DRAINAGE ABSCESS;  Surgeon: Benjamine Sprague, DO;  Location: [facility];  Service: General;  Laterality: N/A;    Family History  Problem Relation Age of Onset  . Diabetes Father   . Hypertension Father   . Congestive Heart Failure Father   . Hypertension Sister   . CVA Sister   . Bipolar disorder Brother   . Schizophrenia Brother     Social History   Socioeconomic History  . Marital status: Single    Spouse name: Not on file  . Number of children: Not on file  . Years of education: Not on file  . Highest education level: Not on file  Occupational History  . Not on file  Tobacco Use  . Smoking status: Current Every Day Smoker    Packs/day: 0.33    Types: Cigarettes  . Smokeless  tobacco: Never Used  . Tobacco comment: 5-10 cigarettes a day  Substance and Sexual Activity  . Alcohol use: Yes    Comment: 32 oz a day  . Drug use: Yes    Types: Marijuana  . Sexual activity: Not on file  Other Topics Concern  . Not on file  Social History Narrative  . Not on file   Social Determinants of Health   Financial Resource Strain:   . Difficulty of Paying Living Expenses:   Food Insecurity:   . Worried About Charity fundraiser in the Last Year:   . Arboriculturist in the Last Year:   Transportation Needs:   . Film/video editor (Medical):   Marland Kitchen Lack of Transportation (Non-Medical):   Physical Activity:   . Days of Exercise per Week:   . Minutes of Exercise per Session:   Stress:   . Feeling of Stress :   Social Connections:   . Frequency of Communication with Friends and Family:   . Frequency of Social Gatherings with Friends and Family:   . Attends Religious Services:   . Active Member of Clubs or Organizations:   . Attends Archivist Meetings:   Marland Kitchen Marital Status:   Intimate Partner Violence:   . Fear of Current or Ex-Partner:   . Emotionally  Abused:   Marland Kitchen Physically Abused:   . Sexually Abused:     Outpatient Medications Prior to Visit  Medication Sig Dispense Refill  . Benzoyl Peroxide (BENZOYL PEROXIDE) 10 % LIQD Apply 148 mLs topically daily. (Patient not taking: Reported on 09/27/2019) 1 g 0  . celecoxib (CELEBREX) 100 MG capsule TAKE 1 CAPSULE BY MOUTH twice a day as needed (Patient not taking: Reported on 09/27/2019) 30 capsule 0  . gabapentin (NEURONTIN) 100 MG capsule Take 1 capsule (100 mg total) by mouth 2 (two) times daily. Take 1 capsule (100 mg total) by mouth 2 times daily as needed (Patient not taking: Reported on 09/27/2019) 60 capsule 1  . terbinafine (LAMISIL) 1 % cream Apply 1 application topically 2 (two) times daily. (Patient not taking: Reported on 09/27/2019) 30 g 0   No facility-administered medications prior to visit.     Allergies  Allergen Reactions  . Ibuprofen Other (See Comments)    Stomach upset    ROS Review of Systems  Constitutional: Negative.   Respiratory: Negative.   Cardiovascular: Negative.   Skin: Positive for wound.  Neurological: Negative.   Psychiatric/Behavioral: Negative.       Objective:    Physical Exam  Constitutional: He is oriented to person, place, and time. He appears well-developed.  HENT:  Head: Normocephalic and atraumatic.  Cardiovascular: Normal rate and regular rhythm.  Pulmonary/Chest: Effort normal and breath sounds normal.  Neurological: He is alert and oriented to person, place, and time.  Skin:  2 small open spots to scrotal abscess, with small amount of purulent drainage.  Psychiatric: He has a normal mood and affect. His behavior is normal. Judgment and thought content normal.    BP (!) 143/93 (BP Location: Left Arm, Patient Position: Sitting, Cuff Size: Large)   Pulse 74   Temp (!) 97.3 F (36.3 C)   Ht 5\' 11"  (1.803 m)   Wt 178 lb 6.4 oz (80.9 kg)   SpO2 96%   BMI 24.88 kg/m  Wt Readings from Last 3 Encounters:  09/27/19 178 lb 6.4 oz (80.9 kg)  08/22/19 178 lb (80.7 kg)  08/16/19 179 lb 14.4 oz (81.6 kg)     Health Maintenance Due  Topic Date Due  . Hepatitis C Screening  Never done  . COVID-19 Vaccine (1) Never done  . TETANUS/TDAP  Never done    There are no preventive care reminders to display for this patient.  Lab Results  Component Value Date   TSH 2.91 01/01/2015   Lab Results  Component Value Date   WBC 8.8 07/26/2019   HGB 15.0 07/26/2019   HCT 45.4 07/26/2019   MCV 94.2 07/26/2019   PLT 228 07/26/2019   Lab Results  Component Value Date   NA 138 07/26/2019   K 3.6 07/26/2019   CO2 26 07/26/2019   GLUCOSE 101 (H) 07/26/2019   BUN 11 07/26/2019   CREATININE 0.93 07/26/2019   BILITOT 0.8 07/26/2019   ALKPHOS 102 07/26/2019   AST 17 07/26/2019   ALT 13 07/26/2019   PROT 7.9 07/26/2019   ALBUMIN 3.6  07/26/2019   CALCIUM 8.8 (L) 07/26/2019   ANIONGAP 9 07/26/2019   Lab Results  Component Value Date   CHOL 141 01/25/2019   Lab Results  Component Value Date   HDL 60 01/25/2019   Lab Results  Component Value Date   LDLCALC 67 01/25/2019   Lab Results  Component Value Date   TRIG 68 01/25/2019   Lab Results  Component Value Date   CHOLHDL 2.4 01/25/2019   Lab Results  Component Value Date   HGBA1C 5.4 01/25/2019      Assessment & Plan:   1. Cyst of perianal area - He will continue on current medication regimen. - gabapentin (NEURONTIN) 100 MG capsule; Take 1 capsule (100 mg total) by mouth 2 (two) times daily. Take 1 capsule (100 mg total) by mouth 2 times daily as needed  Dispense: 60 capsule; Refill: 1  2. History of muscle spasm - He will continue on current medication regimen. - celecoxib (CELEBREX) 100 MG capsule; TAKE 1 CAPSULE BY MOUTH twice a day as needed  Dispense: 30 capsule; Refill: 0  3. Scrotal abscess - He was advised to clean site daily and apply Neomycin-bacitracin ointment daily, and to notify clinic for worsening symptoms. He was advised to call and schedule an appointment with Dermatology because his Financial assistance is active.  4. Exposure to herpes - He was provided with Health Department information to call and schedule an appointment for STD test.     Follow-up: Return in about 3 months (around 12/27/2019), or if symptoms worsen or fail to improve.    Caleb Wegner Jerold Coombe, NP

## 2019-11-22 ENCOUNTER — Telehealth: Payer: Self-pay | Admitting: Pharmacy Technician

## 2019-11-22 NOTE — Telephone Encounter (Signed)
Received updated proof of income.  Patient eligible to receive medication assistance at [facility] until time for re-certification in 9359, and as long as eligibility requirements continue to be met.  [facility]

## 2019-12-08 ENCOUNTER — Ambulatory Visit: Payer: Self-pay | Attending: Internal Medicine

## 2019-12-08 DIAGNOSIS — Z23 Encounter for immunization: Secondary | ICD-10-CM

## 2019-12-08 NOTE — Progress Notes (Signed)
   Covid-19 Vaccination Clinic  Name:  Caleb Brooks    MRN: [id number] DOB: 01-20-1961  12/08/2019  [name] was observed post Covid-19 immunization for 15 minutes without incident. He was provided with Vaccine Information Sheet and instruction to access the V-Safe system.   [name] was instructed to call 911 with any severe reactions post vaccine: Marland Kitchen Difficulty breathing  . Swelling of face and throat  . A fast heartbeat  . A bad rash all over body  . Dizziness and weakness   Immunizations Administered    Name Date Dose VIS Date Route   Pfizer COVID-19 Vaccine 12/08/2019 11:12 AM 0.3 mL 08/02/2018 Intramuscular   Manufacturer: [place]   Lot: [id number]   [place]: [id number]

## 2019-12-26 ENCOUNTER — Ambulatory Visit: Payer: Self-pay | Admitting: Gerontology

## 2019-12-26 ENCOUNTER — Other Ambulatory Visit: Payer: Self-pay

## 2019-12-27 ENCOUNTER — Ambulatory Visit: Payer: Self-pay | Admitting: Gerontology

## 2019-12-29 ENCOUNTER — Ambulatory Visit: Payer: Self-pay

## 2020-02-01 ENCOUNTER — Other Ambulatory Visit: Payer: Self-pay

## 2020-02-01 ENCOUNTER — Ambulatory Visit: Payer: Self-pay | Admitting: Gerontology

## 2020-02-13 ENCOUNTER — Telehealth: Payer: Self-pay | Admitting: Gerontology

## 2020-02-22 ENCOUNTER — Ambulatory Visit: Payer: Self-pay | Admitting: Gerontology

## 2020-02-22 ENCOUNTER — Other Ambulatory Visit: Payer: Self-pay

## 2020-02-22 ENCOUNTER — Encounter: Payer: Self-pay | Admitting: Gerontology

## 2020-02-22 VITALS — BP 138/95 | HR 68 | Ht 71.0 in | Wt 163.5 lb

## 2020-02-22 DIAGNOSIS — M25442 Effusion, left hand: Secondary | ICD-10-CM | POA: Insufficient documentation

## 2020-02-22 DIAGNOSIS — K6289 Other specified diseases of anus and rectum: Secondary | ICD-10-CM

## 2020-02-22 DIAGNOSIS — N492 Inflammatory disorders of scrotum: Secondary | ICD-10-CM

## 2020-02-22 DIAGNOSIS — R5383 Other fatigue: Secondary | ICD-10-CM | POA: Insufficient documentation

## 2020-02-22 MED ORDER — SULFAMETHOXAZOLE-TRIMETHOPRIM 800-160 MG PO TABS: 1.0000 | ORAL_TABLET | Freq: Two times a day (BID) | ORAL | 0 refills | Status: DC

## 2020-02-22 MED ORDER — GABAPENTIN 100 MG PO CAPS: 100.0000 mg | ORAL_CAPSULE | Freq: Two times a day (BID) | ORAL | 1 refills | Status: DC

## 2020-02-22 MED ORDER — BACITRACIN-NEOMYCIN-POLYMYXIN OINTMENT TUBE: 1.0000 "application " | TOPICAL_OINTMENT | CUTANEOUS | 0 refills | Status: DC | PRN

## 2020-02-22 NOTE — Progress Notes (Signed)
Established Patient Office Visit  Subjective:  Patient ID: Caleb Brooks, male    DOB: 12/20/1960  Age: 59 y.o. MRN: [id number]  CC:  Chief Complaint  Patient presents with   Hand Pain    Left index finger   Testicle Pain   Medication Refill    HPI Caleb Brooks presents for follow up of perineal cyst and keloids formed to incision site. He states the boils to his right, left inguinal area and right gluteal area are draining serous exudate. He states that the sites are painful to touch, denies erythema, fever and chills. He states that the metacarpophalangeal joint of his left index finger is swollen. He states that the swelling started 3-4 weeks ago. He admits that the index finger metacarpophareangeal Joint is stiff most of the time. He denies any aggravating or relieving factor. He also c/o fatigue that has been going on for 4 weeks and he reports that he was sick after the 2nd dose of Pfitzer vaccine 3 weeks ago. Overall, he states that he's concerned about his perineal cyst and offers no further complaint.  Past Medical History:  Diagnosis Date   Anxiety    Arthritis    Hypertension     Past Surgical History:  Procedure Laterality Date   CYST REMOVAL TRUNK     Groin area around [place] N/A 07/26/2019   Procedure: INCISION AND DRAINAGE ABSCESS;  Surgeon: Hollice Espy, MD;  Location: [facility];  Service: Urology;  Laterality: N/A;   INCISION AND DRAINAGE ABSCESS N/A 07/26/2019   Procedure: INCISION AND DRAINAGE ABSCESS;  Surgeon: Benjamine Sprague, DO;  Location: [facility];  Service: General;  Laterality: N/A;    Family History  Problem Relation Age of Onset   Diabetes Father    Hypertension Father    Congestive Heart Failure Father    Hypertension Sister    CVA Sister    Bipolar disorder Brother    Schizophrenia Brother     Social History   Socioeconomic History   Marital status: Single    Spouse name: Not on file    Number of children: Not on file   Years of education: Not on file   Highest education level: Not on file  Occupational History   Not on file  Tobacco Use   Smoking status: Current Every Day Smoker    Packs/day: 0.33    Types: Cigarettes   Smokeless tobacco: Never Used   Tobacco comment: 5-10 cigarettes a day  Vaping Use   Vaping Use: Never used  Substance and Sexual Activity   Alcohol use: Yes    Comment: 32 oz a day   Drug use: Yes    Types: Marijuana   Sexual activity: Not on file  Other Topics Concern   Not on file  Social History Narrative   Not on file   Social Determinants of Health   Financial Resource Strain:    Difficulty of Paying Living Expenses: Not on file  Food Insecurity:    Worried About [place] in the Last Year: Not on file   Ran Out of Food in the Last Year: Not on file  Transportation Needs:    Lack of Transportation (Medical): Not on file   Lack of Transportation (Non-Medical): Not on file  Physical Activity:    Days of Exercise per Week: Not on file   Minutes of Exercise per Session: Not on file  Stress:    Feeling  of Stress : Not on file  Social Connections:    Frequency of Communication with Friends and Family: Not on file   Frequency of Social Gatherings with Friends and Family: Not on file   Attends Religious Services: Not on file   Active Member of Clubs or Organizations: Not on file   Attends Archivist Meetings: Not on file   Marital Status: Not on file  Intimate Partner Violence:    Fear of Current or Ex-Partner: Not on file   Emotionally Abused: Not on file   Physically Abused: Not on file   Sexually Abused: Not on file    Outpatient Medications Prior to Visit  Medication Sig Dispense Refill   celecoxib (CELEBREX) 100 MG capsule TAKE 1 CAPSULE BY MOUTH twice a day as needed 30 capsule 0   gabapentin (NEURONTIN) 100 MG capsule Take 1 capsule (100 mg total) by mouth 2 (two) times  daily. Take 1 capsule (100 mg total) by mouth 2 times daily as needed 60 capsule 1   neomycin-bacitracin-polymyxin (NEOSPORIN) OINT Apply 1 application topically as needed for wound care. 30 g 0   No facility-administered medications prior to visit.    Allergies  Allergen Reactions   Ibuprofen Other (See Comments)    Stomach upset    ROS Review of Systems  Constitutional: Positive for fatigue.  Respiratory: Negative.   Cardiovascular: Negative.   Musculoskeletal: Positive for arthralgias (swelling to metacarpophalengeal joint of left index finger.).  Skin:       Perineal boil,with serosanguinous drainage.  Neurological: Negative.       Objective:    Physical Exam HENT:     Head: Normocephalic and atraumatic.  Cardiovascular:     Rate and Rhythm: Normal rate and regular rhythm.     Pulses: Normal pulses.     Heart sounds: Normal heart sounds.  Pulmonary:     Effort: Pulmonary effort is normal.     Breath sounds: Normal breath sounds.  Musculoskeletal:        General: Swelling (Swollen metacarpophareangeal joint of left index finger.) present.  Skin:    Comments: Cyst to right gluteal, right and left inguinal area, serous exudate, no erythema.  Neurological:     General: No focal deficit present.     Mental Status: He is alert and oriented to person, place, and time. Mental status is at baseline.  Psychiatric:        Mood and Affect: Mood normal.        Behavior: Behavior normal.        Thought Content: Thought content normal.        Judgment: Judgment normal.     BP (!) 138/95 (BP Location: Right Arm, Patient Position: Sitting)    Pulse 68    Ht 5' 11"  (1.803 m)    Wt 163 lb 8 oz (74.2 kg)    SpO2 98%    BMI 22.80 kg/m  Wt Readings from Last 3 Encounters:  02/22/20 163 lb 8 oz (74.2 kg)  09/27/19 178 lb 6.4 oz (80.9 kg)  08/22/19 178 lb (80.7 kg)     Health Maintenance Due  Topic Date Due   Hepatitis C Screening  Never done   TETANUS/TDAP  Never done    COLONOSCOPY  Never done   COVID-19 Vaccine (2 - Pfizer 2-dose series) 12/29/2019   INFLUENZA VACCINE  Never done    There are no preventive care reminders to display for this patient.  Lab Results  Component Value  Date   TSH 2.91 01/01/2015   Lab Results  Component Value Date   WBC 8.8 07/26/2019   HGB 15.0 07/26/2019   HCT 45.4 07/26/2019   MCV 94.2 07/26/2019   PLT 228 07/26/2019   Lab Results  Component Value Date   NA 138 07/26/2019   K 3.6 07/26/2019   CO2 26 07/26/2019   GLUCOSE 101 (H) 07/26/2019   BUN 11 07/26/2019   CREATININE 0.93 07/26/2019   BILITOT 0.8 07/26/2019   ALKPHOS 102 07/26/2019   AST 17 07/26/2019   ALT 13 07/26/2019   PROT 7.9 07/26/2019   ALBUMIN 3.6 07/26/2019   CALCIUM 8.8 (L) 07/26/2019   ANIONGAP 9 07/26/2019   Lab Results  Component Value Date   CHOL 141 01/25/2019   Lab Results  Component Value Date   HDL 60 01/25/2019   Lab Results  Component Value Date   LDLCALC 67 01/25/2019   Lab Results  Component Value Date   TRIG 68 01/25/2019   Lab Results  Component Value Date   CHOLHDL 2.4 01/25/2019   Lab Results  Component Value Date   HGBA1C 5.4 01/25/2019      Assessment & Plan:   1. Cyst of perianal area - His cysts draining small amount of serous exudate, no erythema, he will continue on Bactrim, educated on medication side effects and was advised to notify clinic. He was advised to go to the ED with fever, chills, excruciating pain. - sulfamethoxazole-trimethoprim (BACTRIM DS) 800-160 MG tablet; Take 1 tablet by mouth 2 (two) times daily.  Dispense: 20 tablet; Refill: 0 - Ambulatory referral to Dermatology - gabapentin (NEURONTIN) 100 MG capsule; Take 1 capsule (100 mg total) by mouth 2 (two) times daily. Take 1 capsule (100 mg total) by mouth 2 times daily as needed  Dispense: 60 capsule; Refill: 1 - neomycin-bacitracin-polymyxin (NEOSPORIN) OINT; Apply 1 application topically as needed for wound care.   Dispense: 30 g; Refill: 0  2. Fatigue, unspecified type Routine labs will be checked. He lost 15 pounds in 5 months, though he states that it was intentional, but his BMI decreased to 22.80. - CBC w/Diff; Future - Comp Met (CMET); Future - Vitamin D (25 hydroxy)   3. Finger joint swelling, left - Possible OA or RA, will check inflammatory markers. - C-reactive protein; Future - Sedimentation rate; Future - Rheumatoid Factor; Future     Follow-up: Return in about 20 days (around 03/13/2020), or if symptoms worsen or fail to improve.    Sharline Lehane Jerold Coombe, NP
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
## 2020-02-28 ENCOUNTER — Other Ambulatory Visit: Payer: Self-pay

## 2020-02-29 ENCOUNTER — Ambulatory Visit (INDEPENDENT_AMBULATORY_CARE_PROVIDER_SITE_OTHER): Payer: No Typology Code available for payment source | Admitting: Dermatology

## 2020-02-29 ENCOUNTER — Other Ambulatory Visit: Payer: Self-pay

## 2020-02-29 DIAGNOSIS — L732 Hidradenitis suppurativa: Secondary | ICD-10-CM

## 2020-02-29 DIAGNOSIS — Z8739 Personal history of other diseases of the musculoskeletal system and connective tissue: Secondary | ICD-10-CM

## 2020-02-29 MED ORDER — CELECOXIB 100 MG PO CAPS: 100.0000 mg | ORAL_CAPSULE | Freq: Two times a day (BID) | ORAL | 0 refills | Status: DC

## 2020-02-29 MED ORDER — CELECOXIB 100 MG PO CAPS: ORAL_CAPSULE | ORAL | 0 refills | Status: DC

## 2020-02-29 MED ORDER — GABAPENTIN 300 MG PO CAPS: 300.0000 mg | ORAL_CAPSULE | Freq: Every day | ORAL | 0 refills | Status: DC

## 2020-02-29 MED ORDER — DOXYCYCLINE HYCLATE 100 MG PO CAPS: 100.0000 mg | ORAL_CAPSULE | Freq: Two times a day (BID) | ORAL | 0 refills | Status: AC

## 2020-02-29 NOTE — Progress Notes (Deleted)
   Follow-Up Visit   Subjective  Caleb Brooks is a 59 y.o. male who presents for the following: Other (Boils and keloids of groin and buttock areas).    The following portions of the chart were reviewed this encounter and updated as appropriate:      Review of Systems:  No other skin or systemic complaints except as noted in HPI or Assessment and Plan.  Objective  Well appearing patient in no apparent distress; mood and affect are within normal limits.  A focused examination was performed including groin area, buttocks. Relevant physical exam findings are noted in the Assessment and Plan.  Objective  Groin area, buttocks: Scarring, tender cysts and nodules, fistulas and hypertrophic scars.   Assessment & Plan  Hidradenitis suppurativa Groin area, buttocks  Discussed condition and treatment options. Strongly recommend Humira. Information on Humira given today. May consider on follow up. Advised patient that Humira will likely not be his only treatment; he will likely still need antibiotics and/or surgeries in the future also.  Start Doxycycline 100 mg 1 po bid x 1 week then decrease to 1 po qd with food and plenty of fluid.  Gabapentin 300mg  1 po qhs.  Celebrex 100 mg 1 po bid as needed  doxycycline (VIBRAMYCIN) 100 MG capsule - Groin area, buttocks  gabapentin (NEURONTIN) 300 MG capsule - Groin area, buttocks  History of muscle spasm  No follow-ups on file.

## 2020-02-29 NOTE — Patient Instructions (Signed)
Take doxycycline 100 mg 1 tablet twice daily for 1 week then decrease to 1 tablet daily with food and plenty of fluid.

## 2020-02-29 NOTE — Progress Notes (Signed)
   Follow-Up Visit   Subjective  Caleb Brooks is a 59 y.o. male who presents for the following: Other (Boils and keloids of groin and buttock areas). He has had several surgeries in the groin and buttocks areas.  He has had cysts and boils that have drained resulting in scars.  He has several active areas today.  The following portions of the chart were reviewed this encounter and updated as appropriate:  Tobacco  Allergies  Meds  Problems  Med Hx  Surg Hx  Fam Hx     Review of Systems:  No other skin or systemic complaints except as noted in HPI or Assessment and Plan.  Objective  Well appearing patient in no apparent distress; mood and affect are within normal limits.  A focused examination was performed including groin, buttocks. Relevant physical exam findings are noted in the Assessment and Plan.  Objective  Groin area, buttocks: Scarring, tender cysts and nodules, fistulas and hypertrophic scars.   Assessment & Plan  Hidradenitis suppurativa -severe and complicated -with cysts and boils and active painful draining lesions today.  This is complicated by keloid formation which are also painful. Discussed this is not curable, but treatable and we can improve his condition but he will continue to have periodic flares with active areas. Treatment will involve systemic medications requiring laboratory monitoring; and with potential side effects as well as surgical procedures.  This combination will be required to effectively control his condition. Groin area, buttocks  Discussed condition and treatment options. Strongly recommend Humira. Information on Humira given today. May consider on follow up. Advised patient that Humira will likely not be his only treatment; he will likely still need antibiotics and/or surgeries in the future also.  Start Doxycycline 100 mg 1 po bid x 1 week then decrease to 1 po qd with food and plenty of fluid.  Gabapentin 300mg  1 po qhs.  For  pain  Celebrex 100 mg 1 po bid as needed for pain and inflammation  doxycycline (VIBRAMYCIN) 100 MG capsule - Groin area, buttocks  gabapentin (NEURONTIN) 300 MG capsule - Groin area, buttocks  celecoxib (CELEBREX) 100 MG capsule - Groin area, buttocks  History of muscle spasm  Return in about 1 month (around 03/30/2020).  I, [name], CMA, am acting as scribe for [name], MD .  Documentation: I have reviewed the above documentation for accuracy and completeness, and I agree with the above.  [name], MD

## 2020-03-01 ENCOUNTER — Encounter: Payer: Self-pay | Admitting: Dermatology

## 2020-03-13 ENCOUNTER — Other Ambulatory Visit: Payer: Self-pay

## 2020-03-13 ENCOUNTER — Telehealth: Payer: Self-pay | Admitting: Gerontology

## 2020-03-13 NOTE — Telephone Encounter (Signed)
Patient's voicemail had not been set up so could not leave message.  Called in order to administer the social determinants of health screening.

## 2020-03-14 ENCOUNTER — Other Ambulatory Visit: Payer: Self-pay

## 2020-03-14 ENCOUNTER — Ambulatory Visit: Payer: Self-pay | Admitting: Gerontology

## 2020-03-14 ENCOUNTER — Encounter: Payer: Self-pay | Admitting: Gerontology

## 2020-03-14 VITALS — BP 148/87 | HR 60 | Temp 97.9°F | Ht 71.0 in | Wt 164.8 lb

## 2020-03-14 DIAGNOSIS — K6289 Other specified diseases of anus and rectum: Secondary | ICD-10-CM

## 2020-03-14 DIAGNOSIS — R5383 Other fatigue: Secondary | ICD-10-CM

## 2020-03-14 DIAGNOSIS — M25442 Effusion, left hand: Secondary | ICD-10-CM

## 2020-03-14 NOTE — Progress Notes (Signed)
Established Patient Office Visit  Subjective:  Patient ID: Caleb Brooks, male    DOB: 1961/05/11  Age: 59 y.o. MRN: [id number]  CC:  Chief Complaint  Patient presents with  . Pain    Boils- testicles to lower buttocks most painful, rates 10/10    HPI Caleb Brooks presents for follow up of cyst and keloid to perineal area. He reports that he continues to experience constant sharp pain to the keloid formed in his perineal area. He did not pick up Bactrim that was ordered after his clinic visit on 02/22/2020. He was seen by the Dermatologist Dr Wilhemena Durie on 02/29/2020 and was diagnosed with Hidradenitis suppurativa. He was started on Doxycycline 100 mg , Gabapentin 300 mg qhs for pain, Celebrex 100 mg bid for pain and he will follow up with Dermatology on 03/30/2020. He denies fever, chills and reports minimal serous drainage from perineal cyst. Overall, he states that he's doing well and offers no further complaint.    Past Medical History:  Diagnosis Date  . Anxiety   . Arthritis   . Hypertension     Past Surgical History:  Procedure Laterality Date  . CYST REMOVAL TRUNK     Groin area around 2000  . INCISION AND DRAINAGE ABSCESS N/A 07/26/2019   Procedure: INCISION AND DRAINAGE ABSCESS;  Surgeon: Hollice Espy, MD;  Location: [facility];  Service: Urology;  Laterality: N/A;  . INCISION AND DRAINAGE ABSCESS N/A 07/26/2019   Procedure: INCISION AND DRAINAGE ABSCESS;  Surgeon: Benjamine Sprague, DO;  Location: [facility];  Service: General;  Laterality: N/A;    Family History  Problem Relation Age of Onset  . Diabetes Father   . Hypertension Father   . Congestive Heart Failure Father   . Hypertension Sister   . CVA Sister   . Bipolar disorder Brother   . Schizophrenia Brother     Social History   Socioeconomic History  . Marital status: Single    Spouse name: Not on file  . Number of children: Not on file  . Years of education: Not on file  . Highest education level: Not  on file  Occupational History  . Occupation: unemployed  Tobacco Use  . Smoking status: Current Every Day Smoker    Packs/day: 0.50    Years: 30.00    Pack years: 15.00    Types: Cigarettes  . Smokeless tobacco: Never Used  . Tobacco comment: 1/2- 1 pack per day  Vaping Use  . Vaping Use: Never used  Substance and Sexual Activity  . Alcohol use: Yes    Comment: 32 oz a day  . Drug use: Yes    Types: Marijuana  . Sexual activity: Not on file  Other Topics Concern  . Not on file  Social History Narrative  . Not on file   Social Determinants of Health   Financial Resource Strain: Unknown  . Difficulty of Paying Living Expenses: Patient refused  Food Insecurity:   . Worried About [place] fundraiser in the Last Year: Not on file  . Ran Out of Food in the Last Year: Not on file  Transportation Needs: No Transportation Needs  . Lack of Transportation (Medical): No  . Lack of Transportation (Non-Medical): No  Physical Activity:   . Days of Exercise per Week: Not on file  . Minutes of Exercise per Session: Not on file  Stress: Stress Concern Present  . Feeling of Stress : Very much  Social Connections:   .  Frequency of Communication with Friends and Family: Not on file  . Frequency of Social Gatherings with Friends and Family: Not on file  . Attends Religious Services: Not on file  . Active Member of Clubs or Organizations: Not on file  . Attends Archivist Meetings: Not on file  . Marital Status: Not on file  Intimate Partner Violence:   . Fear of Current or Ex-Partner: Not on file  . Emotionally Abused: Not on file  . Physically Abused: Not on file  . Sexually Abused: Not on file    Outpatient Medications Prior to Visit  Medication Sig Dispense Refill  . celecoxib (CELEBREX) 100 MG capsule Take 1 capsule (100 mg total) by mouth 2 (two) times daily. As needed 60 capsule 0  . gabapentin (NEURONTIN) 300 MG capsule Take 1 capsule (300 mg total) by mouth at  bedtime. 30 capsule 0  . neomycin-bacitracin-polymyxin (NEOSPORIN) OINT Apply 1 application topically as needed for wound care. 30 g 0  . doxycycline (VIBRAMYCIN) 100 MG capsule Take 1 capsule (100 mg total) by mouth in the morning and at bedtime. With food and plenty of fluid 60 capsule 0  . sulfamethoxazole-trimethoprim (BACTRIM DS) 800-160 MG tablet Take 1 tablet by mouth 2 (two) times daily. 20 tablet 0   No facility-administered medications prior to visit.    Allergies  Allergen Reactions  . Ibuprofen Other (See Comments)    Stomach upset    ROS Review of Systems  Constitutional: Negative.  Negative for chills and fever.  Respiratory: Negative.   Cardiovascular: Negative.   Neurological: Negative.   Psychiatric/Behavioral: Negative.       Objective:    Physical Exam HENT:     Head: Normocephalic and atraumatic.  Cardiovascular:     Rate and Rhythm: Normal rate and regular rhythm.     Pulses: Normal pulses.     Heart sounds: Normal heart sounds.  Pulmonary:     Effort: Pulmonary effort is normal.     Breath sounds: Normal breath sounds.  Skin:      Neurological:     General: No focal deficit present.     Mental Status: He is alert and oriented to person, place, and time. Mental status is at baseline.  Psychiatric:        Mood and Affect: Mood normal.        Behavior: Behavior normal.        Thought Content: Thought content normal.        Judgment: Judgment normal.     BP (!) 148/87 (BP Location: Left Arm, Patient Position: Supine)   Pulse 60   Temp 97.9 F (36.6 C)   Ht 5\' 11"  (1.803 m)   Wt 164 lb 12.8 oz (74.8 kg)   SpO2 90%   BMI 22.98 kg/m  Wt Readings from Last 3 Encounters:  03/14/20 164 lb 12.8 oz (74.8 kg)  02/22/20 163 lb 8 oz (74.2 kg)  09/27/19 178 lb 6.4 oz (80.9 kg)     Health Maintenance Due  Topic Date Due  . Hepatitis C Screening  Never done  . TETANUS/TDAP  Never done  . COLONOSCOPY  Never done  . COVID-19 Vaccine (2 - Pfizer  2-dose series) 12/29/2019  . INFLUENZA VACCINE  Never done    There are no preventive care reminders to display for this patient.  Lab Results  Component Value Date   TSH 2.91 01/01/2015   Lab Results  Component Value Date   WBC 8.8  07/26/2019   HGB 15.0 07/26/2019   HCT 45.4 07/26/2019   MCV 94.2 07/26/2019   PLT 228 07/26/2019   Lab Results  Component Value Date   NA 138 07/26/2019   K 3.6 07/26/2019   CO2 26 07/26/2019   GLUCOSE 101 (H) 07/26/2019   BUN 11 07/26/2019   CREATININE 0.93 07/26/2019   BILITOT 0.8 07/26/2019   ALKPHOS 102 07/26/2019   AST 17 07/26/2019   ALT 13 07/26/2019   PROT 7.9 07/26/2019   ALBUMIN 3.6 07/26/2019   CALCIUM 8.8 (L) 07/26/2019   ANIONGAP 9 07/26/2019   Lab Results  Component Value Date   CHOL 141 01/25/2019   Lab Results  Component Value Date   HDL 60 01/25/2019   Lab Results  Component Value Date   LDLCALC 67 01/25/2019   Lab Results  Component Value Date   TRIG 68 01/25/2019   Lab Results  Component Value Date   CHOLHDL 2.4 01/25/2019   Lab Results  Component Value Date   HGBA1C 5.4 01/25/2019      Assessment & Plan:   1. Cyst of perianal area - He was advised to continue on Doxycycline, Gabapentin and Celebrex for pain. - He was advised to go to the ED with worsening pain, fever, chills and increased serous drainage. -He was advised to follow up with Dermatology.     Follow-up: Return in about 1 month (around 04/17/2020), or if symptoms worsen or fail to improve.    Shellee Streng Jerold Coombe, NP

## 2020-03-15 LAB — COMPREHENSIVE METABOLIC PANEL
ALT: 14 IU/L (ref 0–44)
AST: 22 IU/L (ref 0–40)
Albumin/Globulin Ratio: 1.1 — ABNORMAL LOW (ref 1.2–2.2)
Albumin: 4.1 g/dL (ref 3.8–4.9)
Alkaline Phosphatase: 152 IU/L — ABNORMAL HIGH (ref 44–121)
BUN/Creatinine Ratio: 12 (ref 9–20)
BUN: 14 mg/dL (ref 6–24)
Bilirubin Total: <0.2 mg/dL (ref 0.0–1.2)
CO2: 24 mmol/L (ref 20–29)
Calcium: 9.3 mg/dL (ref 8.7–10.2)
Chloride: 102 mmol/L (ref 96–106)
Creatinine, Ser: 1.15 mg/dL (ref 0.76–1.27)
GFR calc Af Amer: 81 mL/min/{1.73_m2} (ref >59)
GFR calc non Af Amer: 70 mL/min/{1.73_m2} (ref >59)
Globulin, Total: 3.8 g/dL (ref 1.5–4.5)
Glucose: 113 mg/dL — ABNORMAL HIGH (ref 65–99)
Potassium: 4.6 mmol/L (ref 3.5–5.2)
Sodium: 140 mmol/L (ref 134–144)
Total Protein: 7.9 g/dL (ref 6.0–8.5)

## 2020-03-15 LAB — CBC WITH DIFFERENTIAL/PLATELET
Basophils Absolute: 0 10*3/uL (ref 0.0–0.2)
Basos: 0 %
EOS (ABSOLUTE): 0.2 10*3/uL (ref 0.0–0.4)
Eos: 2 %
Hematocrit: 47.2 % (ref 37.5–51.0)
Hemoglobin: 15.4 g/dL (ref 13.0–17.7)
Immature Grans (Abs): 0 10*3/uL (ref 0.0–0.1)
Immature Granulocytes: 0 %
Lymphocytes Absolute: 1.9 10*3/uL (ref 0.7–3.1)
Lymphs: 31 %
MCH: 29.9 pg (ref 26.6–33.0)
MCHC: 32.6 g/dL (ref 31.5–35.7)
MCV: 92 fL (ref 79–97)
Monocytes Absolute: 0.5 10*3/uL (ref 0.1–0.9)
Monocytes: 8 %
Neutrophils Absolute: 3.6 10*3/uL (ref 1.4–7.0)
Neutrophils: 59 %
Platelets: 295 10*3/uL (ref 150–450)
RBC: 5.15 x10E6/uL (ref 4.14–5.80)
RDW: 12.6 % (ref 11.6–15.4)
WBC: 6.2 10*3/uL (ref 3.4–10.8)

## 2020-03-15 LAB — C-REACTIVE PROTEIN: CRP: 14 mg/L — ABNORMAL HIGH (ref 0–10)

## 2020-03-15 LAB — SEDIMENTATION RATE: Sed Rate: 40 mm/hr — ABNORMAL HIGH (ref 0–30)

## 2020-03-15 LAB — RHEUMATOID FACTOR: Rheumatoid fact SerPl-aCnc: <10 IU/mL (ref 0.0–13.9)

## 2020-04-01 ENCOUNTER — Ambulatory Visit (INDEPENDENT_AMBULATORY_CARE_PROVIDER_SITE_OTHER): Payer: Self-pay | Admitting: Dermatology

## 2020-04-01 ENCOUNTER — Other Ambulatory Visit: Payer: Self-pay | Admitting: Dermatology

## 2020-04-01 ENCOUNTER — Other Ambulatory Visit: Payer: Self-pay

## 2020-04-01 DIAGNOSIS — L732 Hidradenitis suppurativa: Secondary | ICD-10-CM

## 2020-04-01 DIAGNOSIS — R52 Pain, unspecified: Secondary | ICD-10-CM

## 2020-04-01 MED ORDER — CELECOXIB 200 MG PO CAPS: ORAL_CAPSULE | ORAL | 3 refills | Status: DC

## 2020-04-01 MED ORDER — GABAPENTIN 300 MG PO CAPS: ORAL_CAPSULE | ORAL | 3 refills | Status: DC

## 2020-04-01 MED ORDER — DOXYCYCLINE HYCLATE 100 MG PO TABS: ORAL_TABLET | ORAL | 3 refills | Status: DC

## 2020-04-01 NOTE — Progress Notes (Signed)
   Follow-Up Visit   Subjective  Caleb Brooks is a 59 y.o. male who presents for the following: HS (groin and buttocks - abscess has started to heal. Patient needs refills on Doxycycline 100mg  po QD, Celebrex BID, and Gabapentin 300mg  po QHS). Pain and drainage has started to improve with treatment, but worsened again after he ran out of medication.  It is still doing better now than before treatment.  The following portions of the chart were reviewed this enc doing better now than before treatment.ounter and updated as appropriate:  Tobacco  Allergies  Meds  Problems  Med Hx  Surg Hx  Fam Hx     Review of Systems:  No other skin or systemic complaints except as noted in HPI or Assessment and Plan.  Objective  Well appearing patient in no apparent distress; mood and affect are within normal limits.  A focused examination was performed including the groin and buttocks area. Relevant physical exam findings are noted in the Assessment and Plan.  Objective  groin and buttocks: Nodules on the R perianal area, L groin, and R perineum. No active draining lesions today.   Assessment & Plan  Hidradenitis suppurativa with pain groin and buttocks severe and complicated - with cysts and boils, no active painful draining lesions today.  This is complicated by keloid formation which are also painful. Discussed this is not curable, but treatable and we can improve his condition but he will continue to have periodic flares with active areas. Advised there is a hereditary component to condition. Treatment will involve systemic medications requiring laboratory monitoring; and with potential side effects as well as surgical procedures.  This combination will be required to effectively control his condition.  Continue Doxycycline 100mg  po BID x 2 weeks. Then decrease to  QD thereafter. Doxycycline should be taken with food to prevent nausea. Do not lay down for 30 minutes after taking. Be cautious with  sun exposure and use good sun protection while on this medication. Pregnant women should not take this medication.   Continue Gabapentin 300mg  po QHS.   Continue Celebrex 200mg  po BID.   Consider Isotretinoin or Humira at follow up appointment if better controlled.   gabapentin (NEURONTIN) 300 MG capsule - groin and buttocks  celecoxib (CELEBREX) 200 MG capsule - groin and buttocks  doxycycline (VIBRA-TABS) 100 MG tablet - groin and buttocks  Other Related Medications gabapentin (NEURONTIN) 300 MG capsule celecoxib (CELEBREX) 100 MG capsule  Return in about 3 months (around 07/02/2020) for HS follow up .  [name], CMA, am acting as scribe for [name], MD .  Documentation: I have reviewed the above documentation for accuracy and completeness, and I agree with the above.  [name], MD

## 2020-04-02 ENCOUNTER — Encounter: Payer: Self-pay | Admitting: Dermatology

## 2020-04-12 ENCOUNTER — Encounter
Admission: EM | Disposition: A | Discharge status: Home Health | Payer: Self-pay | Source: Home / Self Care | Attending: Internal Medicine

## 2020-04-12 ENCOUNTER — Emergency Department: Payer: Medicaid Other

## 2020-04-12 ENCOUNTER — Inpatient Hospital Stay
Admission: EM | Admit: 2020-04-12 | Discharge: 2020-04-18 | DRG: 799 | Disposition: A | Discharge status: Home Health | Payer: Medicaid Other | Attending: Internal Medicine | Admitting: Internal Medicine

## 2020-04-12 DIAGNOSIS — T794XXA Traumatic shock, initial encounter: Secondary | ICD-10-CM | POA: Diagnosis present

## 2020-04-12 DIAGNOSIS — K661 Hemoperitoneum: Secondary | ICD-10-CM | POA: Diagnosis present

## 2020-04-12 DIAGNOSIS — Y92239 Unspecified place in hospital as the place of occurrence of the external cause: Secondary | ICD-10-CM | POA: Diagnosis present

## 2020-04-12 DIAGNOSIS — D7822 Postprocedural hemorrhage and hematoma of the spleen following other procedure: Secondary | ICD-10-CM

## 2020-04-12 DIAGNOSIS — E876 Hypokalemia: Secondary | ICD-10-CM | POA: Diagnosis not present

## 2020-04-12 DIAGNOSIS — K4020 Bilateral inguinal hernia, without obstruction or gangrene, not specified as recurrent: Secondary | ICD-10-CM | POA: Diagnosis present

## 2020-04-12 DIAGNOSIS — Z79899 Other long term (current) drug therapy: Secondary | ICD-10-CM

## 2020-04-12 DIAGNOSIS — S3609XA Other injury of spleen, initial encounter: Principal | ICD-10-CM | POA: Diagnosis present

## 2020-04-12 DIAGNOSIS — Z833 Family history of diabetes mellitus: Secondary | ICD-10-CM

## 2020-04-12 DIAGNOSIS — J95821 Acute postprocedural respiratory failure: Secondary | ICD-10-CM | POA: Diagnosis not present

## 2020-04-12 DIAGNOSIS — Y848 Other medical procedures as the cause of abnormal reaction of the patient, or of later complication, without mention of misadventure at the time of the procedure: Secondary | ICD-10-CM | POA: Diagnosis present

## 2020-04-12 DIAGNOSIS — Z8249 Family history of ischemic heart disease and other diseases of the circulatory system: Secondary | ICD-10-CM | POA: Diagnosis not present

## 2020-04-12 DIAGNOSIS — D735 Infarction of spleen: Secondary | ICD-10-CM | POA: Diagnosis present

## 2020-04-12 DIAGNOSIS — I772 Rupture of artery: Secondary | ICD-10-CM | POA: Diagnosis present

## 2020-04-12 DIAGNOSIS — N179 Acute kidney failure, unspecified: Secondary | ICD-10-CM | POA: Diagnosis present

## 2020-04-12 DIAGNOSIS — J9600 Acute respiratory failure, unspecified whether with hypoxia or hypercapnia: Secondary | ICD-10-CM

## 2020-04-12 DIAGNOSIS — I10 Essential (primary) hypertension: Secondary | ICD-10-CM | POA: Diagnosis present

## 2020-04-12 DIAGNOSIS — W1830XA Fall on same level, unspecified, initial encounter: Secondary | ICD-10-CM | POA: Diagnosis present

## 2020-04-12 DIAGNOSIS — Y900 Blood alcohol level of less than 20 mg/100 ml: Secondary | ICD-10-CM | POA: Diagnosis present

## 2020-04-12 DIAGNOSIS — S301XXA Contusion of abdominal wall, initial encounter: Secondary | ICD-10-CM | POA: Diagnosis present

## 2020-04-12 DIAGNOSIS — R578 Other shock: Secondary | ICD-10-CM | POA: Diagnosis present

## 2020-04-12 DIAGNOSIS — R234 Changes in skin texture: Secondary | ICD-10-CM | POA: Diagnosis present

## 2020-04-12 DIAGNOSIS — L0292 Furuncle, unspecified: Secondary | ICD-10-CM | POA: Diagnosis present

## 2020-04-12 DIAGNOSIS — K567 Ileus, unspecified: Secondary | ICD-10-CM | POA: Diagnosis not present

## 2020-04-12 DIAGNOSIS — Z823 Family history of stroke: Secondary | ICD-10-CM

## 2020-04-12 DIAGNOSIS — D62 Acute posthemorrhagic anemia: Secondary | ICD-10-CM | POA: Diagnosis present

## 2020-04-12 DIAGNOSIS — L910 Hypertrophic scar: Secondary | ICD-10-CM | POA: Diagnosis present

## 2020-04-12 DIAGNOSIS — F1010 Alcohol abuse, uncomplicated: Secondary | ICD-10-CM | POA: Diagnosis present

## 2020-04-12 DIAGNOSIS — R6521 Severe sepsis with septic shock: Secondary | ICD-10-CM

## 2020-04-12 DIAGNOSIS — T80818A Extravasation of other vesicant agent, initial encounter: Secondary | ICD-10-CM | POA: Diagnosis not present

## 2020-04-12 DIAGNOSIS — F1910 Other psychoactive substance abuse, uncomplicated: Secondary | ICD-10-CM | POA: Diagnosis present

## 2020-04-12 DIAGNOSIS — Z978 Presence of other specified devices: Secondary | ICD-10-CM

## 2020-04-12 DIAGNOSIS — L732 Hidradenitis suppurativa: Secondary | ICD-10-CM | POA: Diagnosis present

## 2020-04-12 DIAGNOSIS — G9341 Metabolic encephalopathy: Secondary | ICD-10-CM | POA: Diagnosis present

## 2020-04-12 DIAGNOSIS — Z01818 Encounter for other preprocedural examination: Secondary | ICD-10-CM

## 2020-04-12 DIAGNOSIS — F1210 Cannabis abuse, uncomplicated: Secondary | ICD-10-CM | POA: Diagnosis present

## 2020-04-12 DIAGNOSIS — R55 Syncope and collapse: Secondary | ICD-10-CM

## 2020-04-12 DIAGNOSIS — A419 Sepsis, unspecified organism: Secondary | ICD-10-CM

## 2020-04-12 DIAGNOSIS — Z23 Encounter for immunization: Secondary | ICD-10-CM | POA: Diagnosis not present

## 2020-04-12 DIAGNOSIS — F17210 Nicotine dependence, cigarettes, uncomplicated: Secondary | ICD-10-CM | POA: Diagnosis present

## 2020-04-12 DIAGNOSIS — L03315 Cellulitis of perineum: Secondary | ICD-10-CM | POA: Diagnosis present

## 2020-04-12 DIAGNOSIS — R9389 Abnormal findings on diagnostic imaging of other specified body structures: Secondary | ICD-10-CM

## 2020-04-12 DIAGNOSIS — F1410 Cocaine abuse, uncomplicated: Secondary | ICD-10-CM | POA: Diagnosis present

## 2020-04-12 DIAGNOSIS — Z20822 Contact with and (suspected) exposure to covid-19: Secondary | ICD-10-CM | POA: Diagnosis present

## 2020-04-12 DIAGNOSIS — D3500 Benign neoplasm of unspecified adrenal gland: Secondary | ICD-10-CM | POA: Diagnosis present

## 2020-04-12 DIAGNOSIS — F419 Anxiety disorder, unspecified: Secondary | ICD-10-CM | POA: Diagnosis present

## 2020-04-12 HISTORY — PX: EMBOLIZATION (CATH LAB): CATH118239

## 2020-04-12 LAB — PHOSPHORUS: Phosphorus: 4.4 mg/dL (ref 2.5–4.6)

## 2020-04-12 LAB — COMPREHENSIVE METABOLIC PANEL
ALT: 10 U/L (ref 0–44)
ALT: 10 U/L (ref 0–44)
AST: 16 U/L (ref 15–41)
AST: 22 U/L (ref 15–41)
Albumin: 3 g/dL — ABNORMAL LOW (ref 3.5–5.0)
Albumin: 2.5 g/dL — ABNORMAL LOW (ref 3.5–5.0)
Alkaline Phosphatase: 107 U/L (ref 38–126)
Alkaline Phosphatase: 78 U/L (ref 38–126)
Anion gap: 13 (ref 5–15)
Anion gap: 11 (ref 5–15)
BUN: 13 mg/dL (ref 6–20)
BUN: 18 mg/dL (ref 6–20)
CO2: 23 mmol/L (ref 22–32)
CO2: 23 mmol/L (ref 22–32)
Calcium: 8.7 mg/dL — ABNORMAL LOW (ref 8.9–10.3)
Calcium: 7.4 mg/dL — ABNORMAL LOW (ref 8.9–10.3)
Chloride: 103 mmol/L (ref 98–111)
Chloride: 104 mmol/L (ref 98–111)
Creatinine, Ser: 1.59 mg/dL — ABNORMAL HIGH (ref 0.61–1.24)
Creatinine, Ser: 1.45 mg/dL — ABNORMAL HIGH (ref 0.61–1.24)
GFR, Estimated: 50 mL/min — ABNORMAL LOW (ref >60)
GFR, Estimated: 56 mL/min — ABNORMAL LOW (ref >60)
Glucose, Bld: 197 mg/dL — ABNORMAL HIGH (ref 70–99)
Glucose, Bld: 204 mg/dL — ABNORMAL HIGH (ref 70–99)
Potassium: 3.7 mmol/L (ref 3.5–5.1)
Potassium: 3.8 mmol/L (ref 3.5–5.1)
Sodium: 139 mmol/L (ref 135–145)
Sodium: 138 mmol/L (ref 135–145)
Total Bilirubin: 0.7 mg/dL (ref 0.3–1.2)
Total Bilirubin: 1.2 mg/dL (ref 0.3–1.2)
Total Protein: 7.2 g/dL (ref 6.5–8.1)
Total Protein: 5.9 g/dL — ABNORMAL LOW (ref 6.5–8.1)

## 2020-04-12 LAB — URINE DRUG SCREEN, QUALITATIVE (ARMC ONLY)
Amphetamines, Ur Screen: NOT DETECTED
Barbiturates, Ur Screen: NOT DETECTED
Benzodiazepine, Ur Scrn: NOT DETECTED
Cannabinoid 50 Ng, Ur ~~LOC~~: POSITIVE — AB
Cocaine Metabolite,Ur ~~LOC~~: POSITIVE — AB
MDMA (Ecstasy)Ur Screen: NOT DETECTED
Methadone Scn, Ur: NOT DETECTED
Opiate, Ur Screen: NOT DETECTED
Phencyclidine (PCP) Ur S: NOT DETECTED
Tricyclic, Ur Screen: NOT DETECTED

## 2020-04-12 LAB — URINALYSIS, COMPLETE (UACMP) WITH MICROSCOPIC
Bacteria, UA: NONE SEEN
Bilirubin Urine: NEGATIVE
Glucose, UA: NEGATIVE mg/dL
Hgb urine dipstick: NEGATIVE
Ketones, ur: NEGATIVE mg/dL
Leukocytes,Ua: NEGATIVE
Nitrite: NEGATIVE
Protein, ur: NEGATIVE mg/dL
Specific Gravity, Urine: >1.046 — ABNORMAL HIGH (ref 1.005–1.030)
pH: 5 (ref 5.0–8.0)

## 2020-04-12 LAB — CBC WITH DIFFERENTIAL/PLATELET
Abs Immature Granulocytes: 0.36 10*3/uL — ABNORMAL HIGH (ref 0.00–0.07)
Abs Immature Granulocytes: 0.41 10*3/uL — ABNORMAL HIGH (ref 0.00–0.07)
Basophils Absolute: 0 10*3/uL (ref 0.0–0.1)
Basophils Absolute: 0 10*3/uL (ref 0.0–0.1)
Basophils Relative: 0 %
Basophils Relative: 0 %
Eosinophils Absolute: 0 10*3/uL (ref 0.0–0.5)
Eosinophils Absolute: 0 10*3/uL (ref 0.0–0.5)
Eosinophils Relative: 0 %
Eosinophils Relative: 0 %
HCT: 30.7 % — ABNORMAL LOW (ref 39.0–52.0)
HCT: 33.5 % — ABNORMAL LOW (ref 39.0–52.0)
Hemoglobin: 10.9 g/dL — ABNORMAL LOW (ref 13.0–17.0)
Hemoglobin: 9.9 g/dL — ABNORMAL LOW (ref 13.0–17.0)
Immature Granulocytes: 2 %
Immature Granulocytes: 2 %
Lymphocytes Relative: 6 %
Lymphocytes Relative: 8 %
Lymphs Abs: 1.2 10*3/uL (ref 0.7–4.0)
Lymphs Abs: 1.2 10*3/uL (ref 0.7–4.0)
MCH: 29.5 pg (ref 26.0–34.0)
MCH: 31.3 pg (ref 26.0–34.0)
MCHC: 32.2 g/dL (ref 30.0–36.0)
MCHC: 32.5 g/dL (ref 30.0–36.0)
MCV: 91.4 fL (ref 80.0–100.0)
MCV: 96.3 fL (ref 80.0–100.0)
Monocytes Absolute: 0.9 10*3/uL (ref 0.1–1.0)
Monocytes Absolute: 1.2 10*3/uL — ABNORMAL HIGH (ref 0.1–1.0)
Monocytes Relative: 6 %
Monocytes Relative: 6 %
Neutro Abs: 13.4 10*3/uL — ABNORMAL HIGH (ref 1.7–7.7)
Neutro Abs: 17 10*3/uL — ABNORMAL HIGH (ref 1.7–7.7)
Neutrophils Relative %: 84 %
Neutrophils Relative %: 86 %
Platelets: 217 10*3/uL (ref 150–400)
Platelets: 292 10*3/uL (ref 150–400)
RBC: 3.36 MIL/uL — ABNORMAL LOW (ref 4.22–5.81)
RBC: 3.48 MIL/uL — ABNORMAL LOW (ref 4.22–5.81)
RDW: 13.5 % (ref 11.5–15.5)
RDW: 15.8 % — ABNORMAL HIGH (ref 11.5–15.5)
WBC: 15.9 10*3/uL — ABNORMAL HIGH (ref 4.0–10.5)
WBC: 19.9 10*3/uL — ABNORMAL HIGH (ref 4.0–10.5)
nRBC: 0 % (ref 0.0–0.2)
nRBC: 0 % (ref 0.0–0.2)

## 2020-04-12 LAB — GLUCOSE, CAPILLARY
Glucose-Capillary: 186 mg/dL — ABNORMAL HIGH (ref 70–99)
Glucose-Capillary: 130 mg/dL — ABNORMAL HIGH (ref 70–99)
Glucose-Capillary: 137 mg/dL — ABNORMAL HIGH (ref 70–99)
Glucose-Capillary: 125 mg/dL — ABNORMAL HIGH (ref 70–99)

## 2020-04-12 LAB — RAPID HIV SCREEN (HIV 1/2 AB+AG)
HIV 1/2 Antibodies: NONREACTIVE
HIV-1 P24 Antigen - HIV24: NONREACTIVE

## 2020-04-12 LAB — RESPIRATORY PANEL BY RT PCR (FLU A&B, COVID)
Influenza A by PCR: NEGATIVE
Influenza B by PCR: NEGATIVE
SARS Coronavirus 2 by RT PCR: NEGATIVE

## 2020-04-12 LAB — ABO/RH: ABO/RH(D): O POS

## 2020-04-12 LAB — PROTIME-INR
INR: 1.2 (ref 0.8–1.2)
Prothrombin Time: 14.9 seconds (ref 11.4–15.2)

## 2020-04-12 LAB — HEMOGLOBIN AND HEMATOCRIT, BLOOD
HCT: 27.7 % — ABNORMAL LOW (ref 39.0–52.0)
Hemoglobin: 9.1 g/dL — ABNORMAL LOW (ref 13.0–17.0)

## 2020-04-12 LAB — PREPARE RBC (CROSSMATCH)

## 2020-04-12 LAB — LACTIC ACID, PLASMA
Lactic Acid, Venous: 5.4 mmol/L (ref 0.5–1.9)
Lactic Acid, Venous: 6.1 mmol/L (ref 0.5–1.9)
Lactic Acid, Venous: 4 mmol/L (ref 0.5–1.9)
Lactic Acid, Venous: 2.6 mmol/L (ref 0.5–1.9)

## 2020-04-12 LAB — HEPATITIS C ANTIBODY: HCV Ab: NONREACTIVE

## 2020-04-12 LAB — ETHANOL: Alcohol, Ethyl (B): <10 mg/dL (ref <10)

## 2020-04-12 LAB — MAGNESIUM
Magnesium: 1.7 mg/dL (ref 1.7–2.4)
Magnesium: 1.5 mg/dL — ABNORMAL LOW (ref 1.7–2.4)

## 2020-04-12 LAB — APTT: aPTT: 34 seconds (ref 24–36)

## 2020-04-12 LAB — PROCALCITONIN: Procalcitonin: 0.28 ng/mL

## 2020-04-12 LAB — MRSA PCR SCREENING: MRSA by PCR: NEGATIVE

## 2020-04-12 LAB — HEPATITIS B SURFACE ANTIGEN: Hepatitis B Surface Ag: NONREACTIVE

## 2020-04-12 SURGERY — EMBOLIZATION
Anesthesia: Moderate Sedation

## 2020-04-12 MED ORDER — MAGNESIUM SULFATE 2 GM/50ML IV SOLN
2.0000 g | Freq: Once | INTRAVENOUS | Status: AC
  Administered 2020-04-12: 2 g via INTRAVENOUS
  Filled 2020-04-12: qty 50

## 2020-04-12 MED ORDER — SODIUM CHLORIDE 0.9 % IV SOLN
10.0000 mL/h | Freq: Once | INTRAVENOUS | Status: AC
  Administered 2020-04-12: 10 mL/h via INTRAVENOUS

## 2020-04-12 MED ORDER — PIPERACILLIN-TAZOBACTAM 3.375 G IVPB 30 MIN
3.3750 g | Freq: Once | INTRAVENOUS | Status: AC
  Administered 2020-04-12: 3.375 g via INTRAVENOUS
  Filled 2020-04-12: qty 50

## 2020-04-12 MED ORDER — LORAZEPAM 1 MG PO TABS: 1.0000 mg | ORAL_TABLET | ORAL | Status: DC | PRN

## 2020-04-12 MED ORDER — SODIUM CHLORIDE 0.9 % IV SOLN
1.0000 g | Freq: Three times a day (TID) | INTRAVENOUS | Status: DC
  Administered 2020-04-12 (×2): 1 g via INTRAVENOUS
  Filled 2020-04-12 (×5): qty 1

## 2020-04-12 MED ORDER — MIDAZOLAM HCL 5 MG/5ML IJ SOLN
INTRAMUSCULAR | Status: AC
  Filled 2020-04-12: qty 5

## 2020-04-12 MED ORDER — LORAZEPAM 2 MG/ML IJ SOLN
1.0000 mg | INTRAMUSCULAR | Status: DC | PRN
  Administered 2020-04-12: 1 mg via INTRAVENOUS
  Filled 2020-04-12: qty 1

## 2020-04-12 MED ORDER — LORAZEPAM 2 MG/ML IJ SOLN: 0.0000 mg | Freq: Two times a day (BID) | INTRAMUSCULAR | Status: DC

## 2020-04-12 MED ORDER — VANCOMYCIN HCL IN DEXTROSE 1-5 GM/200ML-% IV SOLN: 1000.0000 mg | Freq: Once | INTRAVENOUS | Status: DC

## 2020-04-12 MED ORDER — VANCOMYCIN HCL IN DEXTROSE 1-5 GM/200ML-% IV SOLN
1000.0000 mg | Freq: Once | INTRAVENOUS | Status: AC
  Administered 2020-04-12: 1000 mg via INTRAVENOUS
  Filled 2020-04-12: qty 200

## 2020-04-12 MED ORDER — LORAZEPAM 2 MG/ML IJ SOLN
0.0000 mg | Freq: Four times a day (QID) | INTRAMUSCULAR | Status: DC
  Administered 2020-04-13: 2 mg via INTRAVENOUS
  Filled 2020-04-12: qty 1

## 2020-04-12 MED ORDER — SODIUM CHLORIDE 0.9 % IV SOLN: INTRAVENOUS | Status: DC

## 2020-04-12 MED ORDER — CEFAZOLIN SODIUM-DEXTROSE 2-4 GM/100ML-% IV SOLN
INTRAVENOUS | Status: AC
  Administered 2020-04-12: 2 g via INTRAVENOUS
  Filled 2020-04-12: qty 100

## 2020-04-12 MED ORDER — LACTATED RINGERS IV BOLUS
1000.0000 mL | Freq: Once | INTRAVENOUS | Status: AC
  Administered 2020-04-12: 1000 mL via INTRAVENOUS

## 2020-04-12 MED ORDER — CHLORHEXIDINE GLUCONATE CLOTH 2 % EX PADS
6.0000 | MEDICATED_PAD | Freq: Every day | CUTANEOUS | Status: DC
  Administered 2020-04-12 – 2020-04-17 (×5): 6 via TOPICAL

## 2020-04-12 MED ORDER — THIAMINE HCL 100 MG/ML IJ SOLN
Freq: Once | INTRAVENOUS | Status: AC
  Filled 2020-04-12 (×2): qty 1000

## 2020-04-12 MED ORDER — TRANEXAMIC ACID-NACL 1000-0.7 MG/100ML-% IV SOLN
1000.0000 mg | INTRAVENOUS | Status: AC
  Administered 2020-04-12: 1000 mg via INTRAVENOUS
  Filled 2020-04-12: qty 100

## 2020-04-12 MED ORDER — HYDROMORPHONE HCL 1 MG/ML IJ SOLN
0.5000 mg | INTRAMUSCULAR | Status: DC | PRN
  Administered 2020-04-12 (×2): 0.5 mg via INTRAVENOUS
  Filled 2020-04-12 (×2): qty 1

## 2020-04-12 MED ORDER — VANCOMYCIN HCL 750 MG/150ML IV SOLN
750.0000 mg | Freq: Once | INTRAVENOUS | Status: AC
  Administered 2020-04-12: 750 mg via INTRAVENOUS
  Filled 2020-04-12: qty 150

## 2020-04-12 MED ORDER — IOHEXOL 300 MG/ML  SOLN
100.0000 mL | Freq: Once | INTRAMUSCULAR | Status: AC | PRN
  Administered 2020-04-12: 100 mL via INTRAVENOUS

## 2020-04-12 MED ORDER — FENTANYL CITRATE (PF) 100 MCG/2ML IJ SOLN
INTRAMUSCULAR | Status: AC
  Filled 2020-04-12: qty 2

## 2020-04-12 MED ORDER — HYDROMORPHONE HCL 1 MG/ML IJ SOLN
1.0000 mg | INTRAMUSCULAR | Status: DC | PRN
  Administered 2020-04-12 (×2): 1 mg via INTRAVENOUS
  Filled 2020-04-12 (×2): qty 1

## 2020-04-12 MED ORDER — ACETAMINOPHEN 325 MG PO TABS: 650.0000 mg | ORAL_TABLET | Freq: Four times a day (QID) | ORAL | Status: DC | PRN

## 2020-04-12 MED ORDER — LORAZEPAM 2 MG/ML IJ SOLN
1.0000 mg | INTRAMUSCULAR | Status: DC | PRN
  Administered 2020-04-12: 2 mg via INTRAVENOUS
  Filled 2020-04-12 (×2): qty 1

## 2020-04-12 MED ORDER — OXYCODONE-ACETAMINOPHEN 5-325 MG PO TABS
1.0000 | ORAL_TABLET | Freq: Four times a day (QID) | ORAL | Status: DC | PRN
  Administered 2020-04-12: 1 via ORAL
  Filled 2020-04-12: qty 1

## 2020-04-12 MED ORDER — ACETAMINOPHEN 650 MG RE SUPP: 650.0000 mg | Freq: Four times a day (QID) | RECTAL | Status: DC | PRN

## 2020-04-12 MED ORDER — FENTANYL CITRATE (PF) 100 MCG/2ML IJ SOLN
INTRAMUSCULAR | Status: DC | PRN
  Administered 2020-04-12: 50 ug via INTRAVENOUS

## 2020-04-12 MED ORDER — MIDAZOLAM HCL 2 MG/2ML IJ SOLN
INTRAMUSCULAR | Status: DC | PRN
  Administered 2020-04-12: 1 mg via INTRAVENOUS

## 2020-04-12 MED ORDER — SODIUM CHLORIDE 0.9 % IV SOLN
2.0000 g | INTRAVENOUS | Status: DC
  Administered 2020-04-12 – 2020-04-16 (×5): 2 g via INTRAVENOUS
  Filled 2020-04-12: qty 20
  Filled 2020-04-12: qty 2
  Filled 2020-04-12 (×2): qty 20
  Filled 2020-04-12 (×3): qty 2

## 2020-04-12 MED ORDER — ONDANSETRON HCL 4 MG/2ML IJ SOLN
4.0000 mg | Freq: Four times a day (QID) | INTRAMUSCULAR | Status: DC | PRN
  Administered 2020-04-12 – 2020-04-14 (×3): 4 mg via INTRAVENOUS
  Filled 2020-04-12 (×3): qty 2

## 2020-04-12 MED ORDER — TRAZODONE HCL 50 MG PO TABS: 25.0000 mg | ORAL_TABLET | Freq: Every evening | ORAL | Status: DC | PRN

## 2020-04-12 MED ORDER — IODIXANOL 320 MG/ML IV SOLN
INTRAVENOUS | Status: DC | PRN
  Administered 2020-04-12: 35 mL

## 2020-04-12 MED ORDER — MORPHINE SULFATE (PF) 2 MG/ML IV SOLN: 1.0000 mg | INTRAVENOUS | Status: DC | PRN

## 2020-04-12 MED ORDER — MORPHINE SULFATE (PF) 2 MG/ML IV SOLN
2.0000 mg | Freq: Once | INTRAVENOUS | Status: AC
  Administered 2020-04-12: 2 mg via INTRAVENOUS
  Filled 2020-04-12: qty 1

## 2020-04-12 MED ORDER — VANCOMYCIN HCL 750 MG/150ML IV SOLN
750.0000 mg | Freq: Two times a day (BID) | INTRAVENOUS | Status: DC
  Administered 2020-04-12 – 2020-04-13 (×2): 750 mg via INTRAVENOUS
  Filled 2020-04-12 (×6): qty 150

## 2020-04-12 MED ORDER — CEFAZOLIN SODIUM-DEXTROSE 2-4 GM/100ML-% IV SOLN: 2.0000 g | INTRAVENOUS | Status: AC

## 2020-04-12 MED ORDER — GABAPENTIN 300 MG PO CAPS: 300.0000 mg | ORAL_CAPSULE | Freq: Every day | ORAL | Status: DC

## 2020-04-12 MED ORDER — HYDROMORPHONE HCL 1 MG/ML IJ SOLN: 0.5000 mg | INTRAMUSCULAR | Status: DC | PRN

## 2020-04-12 MED ORDER — METRONIDAZOLE IN NACL 5-0.79 MG/ML-% IV SOLN
500.0000 mg | Freq: Three times a day (TID) | INTRAVENOUS | Status: DC
  Administered 2020-04-12 – 2020-04-16 (×13): 500 mg via INTRAVENOUS
  Filled 2020-04-12 (×17): qty 100

## 2020-04-12 MED ORDER — TAMSULOSIN HCL 0.4 MG PO CAPS: 0.4000 mg | ORAL_CAPSULE | Freq: Every day | ORAL | Status: DC

## 2020-04-12 MED ORDER — MAGNESIUM HYDROXIDE 400 MG/5ML PO SUSP: 30.0000 mL | Freq: Every day | ORAL | Status: DC | PRN

## 2020-04-12 MED ORDER — ONDANSETRON HCL 4 MG PO TABS: 4.0000 mg | ORAL_TABLET | Freq: Four times a day (QID) | ORAL | Status: DC | PRN

## 2020-04-12 MED ORDER — INSULIN ASPART 100 UNIT/ML ~~LOC~~ SOLN
0.0000 [IU] | SUBCUTANEOUS | Status: DC
  Administered 2020-04-12 – 2020-04-17 (×6): 1 [IU] via SUBCUTANEOUS
  Filled 2020-04-12 (×6): qty 1

## 2020-04-12 SURGICAL SUPPLY — 19 items
CATH ANGIO 5F PIGTAIL 65CM (CATHETERS) ×2 IMPLANT
CATH BEACON 5 .035 65 C2 TIP (CATHETERS) ×2 IMPLANT
CATH MICROCATH PRGRT 2.8F 130 (MICROCATHETER) ×1 IMPLANT
COIL 400 COMPLEX SOFT 4X15CM (Vascular Products) ×2 IMPLANT
COIL 400 COMPLEX STD 4X20CM (Vascular Products) ×2 IMPLANT
COIL 400 COMPLEX STD 5X30CM (Vascular Products) ×4 IMPLANT
COIL 400 COMPLEX STD 6X30CM (Vascular Products) ×2 IMPLANT
DEVICE OCCLUSION PODJ30 (Vascular Products) ×1 IMPLANT
DEVICE STARCLOSE SE CLOSURE (Vascular Products) ×2 IMPLANT
DEVICE TORQUE .025-.038 (MISCELLANEOUS) ×2 IMPLANT
GLIDEWIRE STIFF .35X180X3 HYDR (WIRE) ×4 IMPLANT
HANDLE DETACHMENT COIL (MISCELLANEOUS) ×2 IMPLANT
KIT CAROTID MANIFOLD (MISCELLANEOUS) IMPLANT
MICROCATH PROGREAT 2.8F 130CM (MICROCATHETER) ×2
OCCLUSION DEVICE PODJ30 (Vascular Products) ×2 IMPLANT
PACK ANGIOGRAPHY (CUSTOM PROCEDURE TRAY) ×2 IMPLANT
SHEATH BRITE TIP 5FRX11 (SHEATH) ×2 IMPLANT
TUBING CONTRAST HIGH PRESS 72 (TUBING) ×4 IMPLANT
WIRE J 3MM .035X145CM (WIRE) ×2 IMPLANT

## 2020-04-12 NOTE — ED Notes (Signed)
Attempted to collect cultures x3 RN's with no success. Antibiotics started, lab called for culture draw

## 2020-04-12 NOTE — H&P (Addendum)
[facility]   PATIENT NAME: Caleb Brooks    MR#:  [id number]  DATE OF BIRTH:  [birth year]  DATE OF ADMISSION:  04/12/2020  PRIMARY CARE PHYSICIAN: Langston Reusing, NP   REQUESTING/REFERRING PHYSICIAN: Gonzella Lex, MD  CHIEF COMPLAINT:   Chief Complaint  Patient presents with   Hypotension    HISTORY OF PRESENT ILLNESS:  Lemon Sternberg  is a 59 y.o. African-American male with a known history of hypertension, hidradenitis suppurativa complicated by recurrent boils and keloid formation anxiety and osteoarthritis as well as EtOH and tobacco abuse, presented to the emergency room with acute onset of syncope twice with associated lightheadedness and dizziness.  The patient was noted to be hypotensive with a BP of 54/30 by EMS.  He has been having purulent drainage from his perineal boils for a few days with associated swelling and severe pain that has been constant.  He admits to left upper quadrant abdominal pain after having a fall on his side.  No nausea or vomiting or melena or bright red bleeding per rectum.  No dyspnea or cough or wheezing or hemoptysis.  No other bleeding diathesis.  No chest pain or palpitations.  Upon presentation to the emergency room, blood pressure was initially 130/82 with a heart rate of 81 and respiratory to 27 and later respiratory it was up to 43 and blood pressure dropped to 87/64 with respiratory rate of 22-30.  BP later on improved with hydration to 122/92 with normal map.  He was afebrile and has been maintaining pulse ox symmetry of 97 to 99% on room air.  Labs revealed a blood glucose of 197 and a BUN of 13 with a creatinine 1.59 compared to 14 and 1.15 on 03/14/2020.  Albumin was 3 and lactic acid 5.4 with procalcitonin 0.28.  CBC showed leukocytosis of 15.9 and anemia with hemoglobin of 10.9 hematocrit of 33.5 compared to 15.4 and 47.2 a month ago.  Platelets were 292.  Alcohol levels less than 10.  Portable chest x-ray showed interstitial  prominence predominantly in the lower lobes that may represent atypical infection.  Abdominal and pelvic CT scan showed the following: 1. Active contrast extravasation arising from the superior pole/dome of the spleen contributing both to a large subcapsular splenic hematoma and moderate volume hemoperitoneum with sentinel clot in the left upper quadrant. 2. Slightly heterogeneous and nodular liver, could correlate for features of intrinsic liver disease/cirrhosis. Consider correlation with LFTs. 3. Skin thickening and subcutaneous fat stranding along the left inguinal crease concerning for a cellulitis with some adjacent reactive adenopathy. 4. More extensive skin thickening and subcutaneous phlegmon without organized abscess along the gluteal cleft, right greater than left with extension to the intersphincteric plane. No clear abscess though inflammatory changes extend below the margin of imaging. Furthermore, fistulization is difficult to exclude on CT imaging. Recommend correlation with visual inspection. 5. Small though slightly enlarging collection contiguous with the dermis along the more posterior right gluteal soft tissues. Could reflect a small dermal inclusion cysts though should correlate with visual inspection. 6. Bilateral fat containing inguinal hernias with small amount of fluid and partial herniation of the left anterolateral bladder into the left inguinal hernia sac. No bowel containing hernias. 7. 1 cm intermediate attenuation nodule in the left adrenal gland, incompletely characterized on this exam. Likely a benign adrenal adenoma though should consider with 1 year follow-up adrenal washout CT. This recommendation follows ACR consensus guidelines: Management of Incidental Adrenal Masses: A White Paper of  the ACR Incidental Findings Committee. J Am Coll Radiol 2017;14:1038-1044. 8. Some patchy ground-glass opacity in the right lung base greater than left, possibly  atelectasis versus atypical infection, difficult to delineate given extensive respiratory motion. 9. Aortic Atherosclerosis  The patient was given IV vancomycin and Zosyn, 2 L bolus of IV lactated Ringer and 3 units of packed red blood cells as well as 1 g of IV tranexamic acid.  Dr. Lysle Pearl was notified about the patient and recommended vascular surgery consult.  Dr. Lucky Cowboy was notified but the patient and is taking him to the OR for embolectomy.  I was asked by the ER physician to admit the patient per Dr. Lysle Pearl and Dr. Bunnie Domino recommendation.  The patient will be admitted to progressive unit bed for further evaluation and management. PAST MEDICAL HISTORY:   Past Medical History:  Diagnosis Date   Anxiety    Arthritis    Hypertension     PAST SURGICAL HISTORY:   Past Surgical History:  Procedure Laterality Date   CYST REMOVAL TRUNK     Groin area around [place] N/A 07/26/2019   Procedure: INCISION AND DRAINAGE ABSCESS;  Surgeon: Hollice Espy, MD;  Location: [facility] ORS;  Service: Urology;  Laterality: N/A;   INCISION AND DRAINAGE ABSCESS N/A 07/26/2019   Procedure: INCISION AND DRAINAGE ABSCESS;  Surgeon: Benjamine Sprague, DO;  Location: [facility] ORS;  Service: General;  Laterality: N/A;    SOCIAL HISTORY:   Social History   Tobacco Use   Smoking status: Current Every Day Smoker    Packs/day: 0.50    Years: 30.00    Pack years: 15.00    Types: Cigarettes   Smokeless tobacco: Never Used   Tobacco comment: 1/2- 1 pack per day  Substance Use Topics   Alcohol use: Yes    Comment: 32 oz a day    FAMILY HISTORY:   Family History  Problem Relation Age of Onset   Diabetes Father    Hypertension Father    Congestive Heart Failure Father    Hypertension Sister    CVA Sister    Bipolar disorder Brother    Schizophrenia Brother     DRUG ALLERGIES:   Allergies  Allergen Reactions   Ibuprofen Other (See Comments)    Stomach upset     REVIEW OF SYSTEMS:   ROS As per history of present illness. All pertinent systems were reviewed above. Constitutional, HEENT, cardiovascular, respiratory, GI, GU, musculoskeletal, neuro, psychiatric, endocrine, integumentary and hematologic systems were reviewed and are otherwise negative/unremarkable except for positive findings mentioned above in the HPI.   MEDICATIONS AT HOME:   Prior to Admission medications   Medication Sig Start Date End Date Taking? Authorizing Provider  celecoxib (CELEBREX) 200 MG capsule Take one tab po BID Patient taking differently: Take 200 mg by mouth 2 (two) times daily. Take one tab po BID 04/01/20  Yes Ralene Bathe, MD  doxycycline (VIBRA-TABS) 100 MG tablet Take one tab po BID x 2 weeks then decrease to QD there after. 04/01/20  Yes Ralene Bathe, MD  gabapentin (NEURONTIN) 300 MG capsule Take one cap po QHS Patient taking differently: Take 300 mg by mouth at bedtime. Take one cap po QHS 04/01/20  Yes Ralene Bathe, MD  celecoxib (CELEBREX) 100 MG capsule Take 1 capsule (100 mg total) by mouth 2 (two) times daily. As needed Patient not taking: Reported on 04/12/2020 02/29/20   Ralene Bathe, MD  gabapentin (NEURONTIN)  300 MG capsule Take 1 capsule (300 mg total) by mouth at bedtime. Patient not taking: Reported on 04/12/2020 02/29/20   Ralene Bathe, MD  neomycin-bacitracin-polymyxin (NEOSPORIN) OINT Apply 1 application topically as needed for wound care. Patient not taking: Reported on 04/01/2020 02/22/20   Iloabachie, Chioma E, NP      VITAL SIGNS:  Blood pressure 103/78, pulse 95, temperature 98.4 F (36.9 C), temperature source Oral, resp. rate (!) 30, height 5\' 11"  (1.803 m), weight 74.8 kg, SpO2 100 %.  PHYSICAL EXAMINATION:  Physical Exam  GENERAL:  59 y.o.-year-old acutely ill lethargic African-American male patient lying in the bed with no acute distress.  He was slow to answer questions. EYES: Pupils equal, round,  reactive to light and accommodation. No scleral icterus. Extraocular muscles intact.  HEENT: Head atraumatic, normocephalic. Oropharynx and nasopharynx clear.  NECK:  Supple, no jugular venous distention. No thyroid enlargement, no tenderness.  LUNGS: Normal breath sounds bilaterally, no wheezing, rales,rhonchi or crepitation. No use of accessory muscles of respiration.  CARDIOVASCULAR: Regular rate and rhythm, S1, S2 normal. No murmurs, rubs, or gallops.  ABDOMEN: Soft, nondistended, with left upper quadrant abdominal tenderness. Bowel sounds present. No organomegaly or mass.  EXTREMITIES: No pedal edema, cyanosis, or clubbing.  NEUROLOGIC: Cranial nerves II through XII are intact. Muscle strength 5/5 in all extremities. Sensation intact. Gait not checked.  PSYCHIATRIC: The patient is alert but lethargic.  SKIN: Perineal swelling with induration, warmth and tenderness LABORATORY PANEL:   CBC Recent Labs  Lab 04/12/20 0016  WBC 15.9*  HGB 10.9*  HCT 33.5*  PLT 292   ------------------------------------------------------------------------------------------------------------------  Chemistries  Recent Labs  Lab 04/12/20 0016 04/12/20 0207  NA 139  --   K 3.7  --   CL 103  --   CO2 23  --   GLUCOSE 197*  --   BUN 13  --   CREATININE 1.59*  --   CALCIUM 8.7*  --   MG  --  1.7  AST 16  --   ALT 10  --   ALKPHOS 107  --   BILITOT 0.7  --    ------------------------------------------------------------------------------------------------------------------  Cardiac Enzymes No results for input(s): TROPONINI in the last 168 hours. ------------------------------------------------------------------------------------------------------------------  RADIOLOGY:  CT ABDOMEN PELVIS W CONTRAST  Result Date: 04/12/2020 CLINICAL DATA:  Abdominal pain, unresponsive and nonverbal EXAM: CT ABDOMEN AND PELVIS WITH CONTRAST TECHNIQUE: Multidetector CT imaging of the abdomen and pelvis was  performed using the standard protocol following bolus administration of intravenous contrast. CONTRAST:  120mL OMNIPAQUE IOHEXOL 300 MG/ML  SOLN COMPARISON:  Same day chest radiograph, CT pelvis 07/26/2019 FINDINGS: Lower chest: Evaluation of the lungs complicated by extensive respiratory motion artifact. Some patchy ground-glass opacity could reflect atelectatic changes or less likely infection. Normal heart size. No pericardial effusion. Few coronary artery calcifications. Hepatobiliary: Mild heterogeneity of the hepatic attenuation with a slightly nodular liver surface contour. No focal liver lesion. Moderate distention of the gallbladder. No significant gallbladder wall thickening. Pericholecystic fluid is likely redistributed from the larger volume hemoperitoneum throughout the abdomen. No visible calcified gallstones or biliary dilatation. Pancreas: No pancreatic ductal dilatation or surrounding inflammatory changes. Spleen: Partial compression of the spleen by a large subcapsular hematoma measuring approximately 8 x 6.2 x 11.7 cm in size with a blush of active contrast extravasation arising from the superior spleen and extending into the peritoneum with sentinel clot sign throughout the left upper quadrant. Adrenals/Urinary Tract: 1 cm intermediate attenuation nodule in left adrenal  gland. Kidneys are normally located with symmetric enhancement and excretion. No suspicious renal lesion, urolithiasis or hydronephrosis. Some mild circumferential thickening of the bladder is present. There is a left bladder ear deviating towards a left inguinal hernia which contains some fat hemoperitoneum as well. Stomach/Bowel: Sliding-type hiatal hernia. Stomach is unremarkable. Mild wall thickening of the small bowel including the duodenum is nonspecific given the moderate volume hemoperitoneum. Some additional nonspecific colonic mural thickening the distal transverse and splenic flexure as well. Vascular/Lymphatic:  Atherosclerotic calcifications within the abdominal aorta and branch vessels. No aneurysm or ectasia. Active contrast extravasation arising from the upper pole spleen. Few prominent, likely reactive nodes present in the inguinal region. No other conspicuous enlarged adenopathy in the abdomen or pelvis. Reproductive: Prostatomegaly with indentation of the bladder base. Few coarse eccentric calcifications of the prostate and seminal vesicles, often senescent change. Some mild corporal calcifications are noted as well. Other: Moderate volume hemoperitoneum with sentinel clot in the left upper quadrant and active contrast extravasation arising from the superior pole of the spleen extending into the peritoneum proper. Bilateral fat containing inguinal hernias with small amount of fluid and partial herniation of the left anterolateral bladder into the left inguinal hernia sac. No bowel containing hernias. Focal skin thickening and subcutaneous fat stranding along the left inguinal crease with some adjacent reactive adenopathy. Additional skin thickening and subcutaneous fat stranding along the gluteal cleft, right greater than left with some mild thickening and inflammation which extends to the levator plate and intersphincteric plane. No clear abscess. A fistulization is difficult to exclude on CT imaging. Inflammatory changes extend below the margin of imaging within abscess in this vicinity seen on comparison pelvic CT. Additional enlarging subcutaneous soft tissue nodule along the medial right gluteal soft tissues superficially (2/86) measuring up to 1.8 by 0.9 cm, previously 0.8 x 0.4 cm. While this could reflect a dermal inclusion cyst, should correlate with visual inspection. Musculoskeletal: No acute osseous abnormality or suspicious osseous lesion. Degenerative changes in the spine, hips and pelvis. IMPRESSION: 1. Active contrast extravasation arising from the superior pole/dome of the spleen contributing both to  a large subcapsular splenic hematoma and moderate volume hemoperitoneum with sentinel clot in the left upper quadrant. 2. Slightly heterogeneous and nodular liver, could correlate for features of intrinsic liver disease/cirrhosis. Consider correlation with LFTs. 3. Skin thickening and subcutaneous fat stranding along the left inguinal crease concerning for a cellulitis with some adjacent reactive adenopathy. 4. More extensive skin thickening and subcutaneous phlegmon without organized abscess along the gluteal cleft, right greater than left with extension to the intersphincteric plane. No clear abscess though inflammatory changes extend below the margin of imaging. Furthermore, fistulization is difficult to exclude on CT imaging. Recommend correlation with visual inspection. 5. Small though slightly enlarging collection contiguous with the dermis along the more posterior right gluteal soft tissues. Could reflect a small dermal inclusion cysts though should correlate with visual inspection. 6. Bilateral fat containing inguinal hernias with small amount of fluid and partial herniation of the left anterolateral bladder into the left inguinal hernia sac. No bowel containing hernias. 7. 1 cm intermediate attenuation nodule in the left adrenal gland, incompletely characterized on this exam. Likely a benign adrenal adenoma though should consider with 1 year follow-up adrenal washout CT. This recommendation follows ACR consensus guidelines: Management of Incidental Adrenal Masses: A White Paper of the ACR Incidental Findings Committee. J Am Coll Radiol 2017;14:1038-1044. 8. Some patchy ground-glass opacity in the right lung base greater than left,  possibly atelectasis versus atypical infection, difficult to delineate given extensive respiratory motion. 9. Aortic Atherosclerosis (ICD10-I70.0). Critical Value/emergent results were called by telephone at the time of interpretation on 04/12/2020 at 2:03 am to provider [facility] , who verbally acknowledged these results. Electronically Signed   By: Lovena Le M.D.   On: 04/12/2020 02:03   DG Chest Portable 1 View  Result Date: 04/12/2020 CLINICAL DATA:  Sepsis EXAM: PORTABLE CHEST 1 VIEW COMPARISON:  09/13/2007 FINDINGS: Interstitial prominence within the lungs, most pronounced in the lower lobes. Heart is normal size. No effusions. No acute bony abnormality. IMPRESSION: Interstitial prominence predominantly in the lower lobes. This could reflect atypical infection. Electronically Signed   By: Rolm Baptise M.D.   On: 04/12/2020 00:36      IMPRESSION AND PLAN:   1.  Acute likely traumatic splenic hemorrhage with subsequent hemorrhagic shock and syncope twice. -The patient was urgently taken to the OR for splenic embolectomy for subcapsular hematoma and bleeding. -He may ultimately need splenectomy. -He will be continued on aggressive hydration with IV normal saline. -We will utilize IV pressor therapy if needed. -Vascular surgery consult was obtained as mentioned above by Dr. Lucky Cowboy who will be operating on the patient. -General surgery consult to be obtained as well. -Dr. Lysle Pearl was notified about the patient.  2.  Perineal severe suppurative cellulitis with history of hidradenitis suppurativa.  He could be having septic shock contributing to his hypotension. -We will continue antibiotic therapy with IV vancomycin and meropenem. -We will continue hydration with IV normal saline as mentioned above. -Pain management will be provided. -General surgery consult will be obtained as mentioned above.  3.  Tobacco abuse. -He will be counseled for smoking cessation.  4.  Alcohol abuse. -He will be monitored for withdrawal. -IV banana bag will be provided. -As needed IV Ativan will be given for withdrawal.  5.  DVT prophylaxis. -SCDs. -Medical prophylaxis currently contraindicated due to splenic hemorrhage.   All the records are reviewed and case discussed  with ED provider. The plan of care was discussed in details with the patient (and family). I answered all questions. The patient agreed to proceed with the above mentioned plan. Further management will depend upon hospital course.   CODE STATUS: Full code  Status is: Inpatient  Remains inpatient appropriate because:Hemodynamically unstable, Ongoing active pain requiring inpatient pain management, Ongoing diagnostic testing needed not appropriate for outpatient work up, Unsafe d/c plan, IV treatments appropriate due to intensity of illness or inability to take PO and Inpatient level of care appropriate due to severity of illness   Dispo: The patient is from: Home              Anticipated d/c is to: Home              Anticipated d/c date is: > 3 days              Patient currently is not medically stable to d/c.  Authorized and performed by: Eugenie Norrie, MD Total critical care time: Approximately  60    minutes. Due to a high probability of clinically significant, life-threatening deterioration, the patient required my highest level of preparedness to intervene emergently and I personally spent this critical care time directly and personally managing the patient.  This critical care time included obtaining a history, examining the patient, pulse oximetry, ordering and review of studies, arranging urgent treatment with development of management plan, evaluation of patient's response to treatment, frequent  reassessment, and discussions with other providers. This critical care time was performed to assess and manage the high probability of imminent, life-threatening deterioration that could result in multiorgan failure.  It was exclusive of separately billable procedures and treating other patients and teaching time.  Please see MDM section and the rest of the note for further information on patient assessment and treatment.  TOTAL CRITICAL CARE TIME TAKING CARE OF THIS PATIENT: 65 minutes.     Christel Mormon M.D on 04/12/2020 at 5:29 AM  [facility]   From 7 PM-7 AM, contact night-coverage [email or web address]  CC: Primary care physician; Langston Reusing, NP

## 2020-04-12 NOTE — Progress Notes (Signed)
PROGRESS NOTE    Caleb Brooks  BWL:[id number] DOB: January 13, 1961 DOA: 04/12/2020 PCP: Langston Reusing, NP   Chief Complaint  Patient presents with  . Hypotension   Brief Narrative:  Caleb Brooks  is Caleb Brooks 59 y.o. African-American male with Caleb Brooks known history of hypertension, hidradenitis suppurativa complicated by recurrent boils and keloid formation anxiety and osteoarthritis as well as EtOH and tobacco abuse, presented to the emergency room with acute onset of syncope twice with associated lightheadedness and dizziness.  The patient was noted to be hypotensive with [name] BP of 54/30 by EMS.  He has been having purulent drainage from his perineal boils for [name] few days with associated swelling and severe pain that has been constant.  He admits to left upper quadrant abdominal pain after having [name] fall on his side.  No nausea or vomiting or melena or bright red bleeding per rectum.  No dyspnea or cough or wheezing or hemoptysis.  No other bleeding diathesis.  No chest pain or palpitations.  Upon presentation to the emergency room, blood pressure was initially 130/82 with [name] heart rate of 81 and respiratory to 27 and later respiratory it was up to 43 and blood pressure dropped to 87/64 with respiratory rate of 22-30.  BP later on improved with hydration to 122/92 with normal map.  He was afebrile and has been maintaining pulse ox symmetry of 97 to 99% on room air.  Labs revealed [name] blood glucose of 197 and [name] BUN of 13 with [name] creatinine 1.59 compared to 14 and 1.15 on 03/14/2020.  Albumin was 3 and lactic acid 5.4 with procalcitonin 0.28.  CBC showed leukocytosis of 15.9 and anemia with hemoglobin of 10.9 hematocrit of 33.5 compared to 15.4 and 47.2 [name] month ago.  Platelets were 292.  Alcohol levels less than 10.  Portable chest x-ray showed interstitial prominence predominantly in the lower lobes that may represent atypical infection.  Abdominal and pelvic CT scan showed the following: 1. Active contrast extravasation  arising from the superior pole/dome of the spleen contributing both to Kaleah Hagemeister large subcapsular splenic hematoma and moderate volume hemoperitoneum with sentinel clot in the left upper quadrant. 2. Slightly heterogeneous and nodular liver, could correlate for features of intrinsic liver disease/cirrhosis. Consider correlation with LFTs. 3. Skin thickening and subcutaneous fat stranding along the left inguinal crease concerning for Audine Mangione cellulitis with some adjacent reactive adenopathy. 4. More extensive skin thickening and subcutaneous phlegmon without organized abscess along the gluteal cleft, right greater than left with extension to the intersphincteric plane. No clear abscess though inflammatory changes extend below the margin of imaging. Furthermore, fistulization is difficult to exclude on CT imaging. Recommend correlation with visual inspection. 5. Small though slightly enlarging collection contiguous with the dermis along the more posterior right gluteal soft tissues. Could reflect Taraya Steward small dermal inclusion cysts though should correlate with visual inspection. 6. Bilateral fat containing inguinal hernias with small amount of fluid and partial herniation of the left anterolateral bladder into the left inguinal hernia sac. No bowel containing hernias. 7. 1 cm intermediate attenuation nodule in the left adrenal gland, incompletely characterized on this exam. Likely Presleigh Feldstein benign adrenal adenoma though should consider with 1 year follow-up adrenal washout CT. This recommendation follows ACR consensus guidelines: Management of Incidental Adrenal Masses: Trinitie Mcgirr White Paper of the ACR Incidental Findings Committee. J Am Coll Radiol 2017;14:1038-1044. 8. Some patchy ground-glass opacity in the right lung base greater than left, possibly atelectasis versus atypical infection, difficult to delineate given  extensive respiratory motion. 9. Aortic Atherosclerosis  The patient was given IV vancomycin and  Zosyn, 2 L bolus of IV lactated Ringer and 3 units of packed red blood cells as well as 1 g of IV tranexamic acid.  Dr. Lysle Pearl was notified about the patient and recommended vascular surgery consult.  Dr. Lucky Cowboy was notified but the patient and is taking him to the OR for embolectomy.  I was asked by the ER physician to admit the patient per Dr. Lysle Pearl and Dr. Bunnie Domino recommendation.  The patient will be admitted to progressive unit bed for further evaluation and management.  Assessment & Plan:   Active Problems:   Splenic hemorrhage  1.  Acute splenic hemorrhage with subsequent hemorrhagic shock and syncope twice. -- unclear if spontaneous or 2/2 trauma from fall from standing -- CT with active contrast extravasation from superior pole/dome of spleen contributing to large subcapsular splenic hematoma and moderate volume hemoperitoneum with sentinel clot in LUQ (see report) - s/p coil embolization of splenic artery 11/5 - He may ultimately need splenectomy. - s/p 3 units pRBC, continue IVF for now -- follow serial Hb/Hct, lactate -- vascular surgery c/s, appreciate assistance -- general surgery c/s, appreciate assistance -- analgesia prn  -- continue NPO for now  2.  Perineal severe suppurative cellulitis with history of hidradenitis suppurativa -- suspect primary reason for shock was from acute blood loss anemia rather than sepsis, though pt notes worsening drainage recently  -- will continue abx therapy for now -- appreciate general surgery c/s -- follow blood cultures, negative mrsa pcr  # AKI: 2/2 hemorrhagic shock, follow  # Abnormal CXR: possible atypical infection, he's on abx, follow serial CXR.  Negative covid/flu.  # Possible Liver Disease vs Cirrhosis -- LFT's wnl, follow outpatient.  Normal INR, bili, platelets.  3.  Tobacco abuse. -He will be counseled for smoking cessation.  4.  Alcohol abuse. -He will be monitored for withdrawal. -IV banana bag will be provided. -As  needed IV Ativan will be given for withdrawal.  5.  DVT prophylaxis. -SCDs. -Medical prophylaxis currently contraindicated due to splenic hemorrhage.  DVT prophylaxis: SCD Code Status: full  Family Communication: none at bedside Disposition:   Status is: Inpatient  Remains inpatient appropriate because:Inpatient level of care appropriate due to severity of illness   Dispo: The patient is from: Home              Anticipated d/c is to: Home              Anticipated d/c date is: > 3 days              Patient currently is not medically stable to d/c.  Consultants:   Surgery  Vascular   Procedures: \ Procedure(s) Performed: 1. Ultrasound guidance for vascular access right femoral artery 2. Catheter placement into splenic artery from right femoral approach 3. Aortogram and selective angiogram of the celiac artery and of the splenic artery 4. Coil embolization of the splenic artery with Maahir Horst total of 6 Ruby coils 5. StarClose closure device right femoral artery  Antimicrobials:  Anti-infectives (From admission, onward)   Start     Dose/Rate Route Frequency Ordered Stop   04/12/20 1300  vancomycin (VANCOREADY) IVPB 750 mg/150 mL        750 mg 150 mL/hr over 60 Minutes Intravenous Every 12 hours 04/12/20 0544     04/12/20 0600  meropenem (MERREM) 1 g in sodium chloride 0.9 % 100 mL IVPB  1 g 200 mL/hr over 30 Minutes Intravenous Every 8 hours 04/12/20 0526     04/12/20 0545  vancomycin (VANCOREADY) IVPB 750 mg/150 mL        750 mg 150 mL/hr over 60 Minutes Intravenous  Once 04/12/20 0537 04/12/20 0949   04/12/20 0530  vancomycin (VANCOCIN) IVPB 1000 mg/200 mL premix  Status:  Discontinued        1,000 mg 200 mL/hr over 60 Minutes Intravenous  Once 04/12/20 0526 04/12/20 0650   04/12/20 0524  ceFAZolin (ANCEF) IVPB 2g/100 mL premix        2 g 200 mL/hr over 30 Minutes Intravenous 30 min pre-op 04/12/20  0524 04/12/20 0554   04/12/20 0030  piperacillin-tazobactam (ZOSYN) IVPB 3.375 g        3.375 g 100 mL/hr over 30 Minutes Intravenous  Once 04/12/20 0021 04/12/20 0213   04/12/20 0030  vancomycin (VANCOCIN) IVPB 1000 mg/200 mL premix        1,000 mg 200 mL/hr over 60 Minutes Intravenous  Once 04/12/20 0021 04/12/20 0236      Subjective: C/o abdominal pain  Objective: Vitals:   04/12/20 0642 04/12/20 0700 04/12/20 0800 04/12/20 0900  BP: (!) 122/92  (!) 160/104 (!) 155/105  Pulse: 80 91 73 81  Resp: (!) 21     Temp: (!) 97.4 F (36.3 C)  (!) 96.3 F (35.7 C)   TempSrc: Axillary  Axillary   SpO2:      Weight:      Height:        Intake/Output Summary (Last 24 hours) at 04/12/2020 1004 Last data filed at 04/12/2020 0452 Gross per 24 hour  Intake 677 ml  Output 240 ml  Net 437 ml   Filed Weights   04/12/20 0013 04/12/20 0620  Weight: 74.8 kg 76.4 kg    Examination:  General exam: Appears calm and comfortable  Respiratory system: Clear to auscultation. Respiratory effort normal. Cardiovascular system: S1 & S2 heard, RRR.  Gastrointestinal system: Abdomen is distended, diffusely tender to palpation.  Mild guarding, no rebound. Central nervous system: Alert and oriented. No focal neurological deficits. Extremities: no LEE Skin: No rashes, lesions or ulcers Psychiatry: Judgement and insight appear normal. Mood & affect appropriate.     Data Reviewed: I have personally reviewed following labs and imaging studies  CBC: Recent Labs  Lab 04/12/20 0016 04/12/20 0930  WBC 15.9* 19.9*  NEUTROABS 13.4* 17.0*  HGB 10.9* 9.9*  HCT 33.5* 30.7*  MCV 96.3 91.4  PLT 292 161    Basic Metabolic Panel: Recent Labs  Lab 04/12/20 0016 04/12/20 0207  NA 139  --   K 3.7  --   CL 103  --   CO2 23  --   GLUCOSE 197*  --   BUN 13  --   CREATININE 1.59*  --   CALCIUM 8.7*  --   MG  --  1.7    GFR: Estimated Creatinine Clearance: 54.6 mL/min (Milliani Herrada) (by C-G formula based  on SCr of 1.59 mg/dL (H)).  Liver Function Tests: Recent Labs  Lab 04/12/20 0016  AST 16  ALT 10  ALKPHOS 107  BILITOT 0.7  PROT 7.2  ALBUMIN 3.0*    CBG: Recent Labs  Lab 04/12/20 0622  GLUCAP 186*     Recent Results (from the past 240 hour(s))  Blood culture (routine x 2)     Status: None (Preliminary result)   Collection Time: 04/12/20  2:07 AM   Specimen: BLOOD  Result Value Ref Range Status   Specimen Description BLOOD LINE RIGHT HAND  Final   Special Requests   Final    BOTTLES DRAWN AEROBIC AND ANAEROBIC Blood Culture results may not be optimal due to an inadequate volume of blood received in culture bottles   Culture   Final    NO GROWTH < 12 HOURS Performed at [facility], [address] [place], [place] [address]    Report Status PENDING  Incomplete  Blood culture (routine x 2)     Status: None (Preliminary result)   Collection Time: 04/12/20  2:07 AM   Specimen: BLOOD  Result Value Ref Range Status   Specimen Description BLOOD RIGHT HAND  Final   Special Requests   Final    BOTTLES DRAWN AEROBIC AND ANAEROBIC Blood Culture adequate volume   Culture   Final    NO GROWTH < 12 HOURS Performed at [facility], [address] [place], [place] [address]    Report Status PENDING  Incomplete  Respiratory Panel by RT PCR (Flu Joffrey Kerce&B, Covid) - Nasopharyngeal Swab     Status: None   Collection Time: 04/12/20  2:07 AM   Specimen: Nasopharyngeal Swab  Result Value Ref Range Status   SARS Coronavirus 2 by RT PCR NEGATIVE NEGATIVE Final    Comment: (NOTE) SARS-CoV-2 target nucleic acids are NOT DETECTED.  The SARS-CoV-2 RNA is generally detectable in upper respiratoy specimens during the acute phase of infection. The lowest concentration of SARS-CoV-2 viral copies this assay can detect is 131 copies/mL. Scarlett Portlock negative result does not preclude SARS-Cov-2 infection and should not be used as the sole basis for treatment or other patient management  decisions. Deion Forgue negative result may occur with  improper specimen collection/handling, submission of specimen other than nasopharyngeal swab, presence of viral mutation(s) within the areas targeted by this assay, and inadequate number of viral copies (<131 copies/mL). Neilson Oehlert negative result must be combined with clinical observations, patient history, and epidemiological information. The expected result is Negative.  Fact Sheet for Patients:  PinkCheek.be  Fact Sheet for Healthcare Providers:  GravelBags.it  This test is no t yet approved or cleared by the [place] FDA and  has been authorized for detection and/or diagnosis of SARS-CoV-2 by FDA under an Emergency Use Authorization (EUA). This EUA will remain  in effect (meaning this test can be used) for the duration of the COVID-19 declaration under Section 564(b)(1) of the Act, 21 U.S.C. section 360bbb-3(b)(1), unless the authorization is terminated or revoked sooner.     Influenza Forbes Loll by PCR NEGATIVE NEGATIVE Final   Influenza B by PCR NEGATIVE NEGATIVE Final    Comment: (NOTE) The Xpert Xpress SARS-CoV-2/FLU/RSV assay is intended as an aid in  the diagnosis of influenza from Nasopharyngeal swab specimens and  should not be used as Debera Sterba sole basis for treatment. Nasal washings and  aspirates are unacceptable for Xpert Xpress SARS-CoV-2/FLU/RSV  testing.  Fact Sheet for Patients: PinkCheek.be  Fact Sheet for Healthcare Providers: GravelBags.it  This test is not yet approved or cleared by the [place] FDA and  has been authorized for detection and/or diagnosis of SARS-CoV-2 by  FDA under an Emergency Use Authorization (EUA). This EUA will remain  in effect (meaning this test can be used) for the duration of the  Covid-19 declaration under Section 564(b)(1) of the Act, 21  U.S.C. section 360bbb-3(b)(1), unless the  authorization is  terminated or revoked. Performed at [facility], [address] [place], [place] [address]   MRSA PCR Screening     Status: None   Collection Time: 04/12/20  6:14 AM   Specimen: Nasopharyngeal  Result Value Ref Range Status   MRSA by PCR NEGATIVE NEGATIVE Final    Comment:        The GeneXpert MRSA Assay (FDA approved for NASAL specimens only), is one component of Izeah Vossler comprehensive MRSA colonization surveillance program. It is not intended to diagnose MRSA infection nor to guide or monitor treatment for MRSA infections. Performed at [facility], [address], [place], [place] [address]          Radiology Studies: CT ABDOMEN PELVIS W CONTRAST  Result Date: 04/12/2020 CLINICAL DATA:  Abdominal pain, unresponsive and nonverbal EXAM: CT ABDOMEN AND PELVIS WITH CONTRAST TECHNIQUE: Multidetector CT imaging of the abdomen and pelvis was performed using the standard protocol following bolus administration of intravenous contrast. CONTRAST:  139mL OMNIPAQUE IOHEXOL 300 MG/ML  SOLN COMPARISON:  Same day chest radiograph, CT pelvis 07/26/2019 FINDINGS: Lower chest: Evaluation of the lungs complicated by extensive respiratory motion artifact. Some patchy ground-glass opacity could reflect atelectatic changes or less likely infection. Normal heart size. No pericardial effusion. Few coronary artery calcifications. Hepatobiliary: Mild heterogeneity of the hepatic attenuation with Alizah Sills slightly nodular liver surface contour. No focal liver lesion. Moderate distention of the gallbladder. No significant gallbladder wall thickening. Pericholecystic fluid is likely redistributed from the larger volume hemoperitoneum throughout the abdomen. No visible calcified gallstones or biliary dilatation. Pancreas: No pancreatic ductal dilatation or surrounding inflammatory changes. Spleen: Partial compression of the spleen by Lillyauna Jenkinson large subcapsular hematoma measuring approximately  8 x 6.2 x 11.7 cm in size with Zarinah Oviatt blush of active contrast extravasation arising from the superior spleen and extending into the peritoneum with sentinel clot sign throughout the left upper quadrant. Adrenals/Urinary Tract: 1 cm intermediate attenuation nodule in left adrenal gland. Kidneys are normally located with symmetric enhancement and excretion. No suspicious renal lesion, urolithiasis or hydronephrosis. Some mild circumferential thickening of the bladder is present. There is Pharrell Ledford left bladder ear deviating towards Sharlon Pfohl left inguinal hernia which contains some fat hemoperitoneum as well. Stomach/Bowel: Sliding-type hiatal hernia. Stomach is unremarkable. Mild wall thickening of the small bowel including the duodenum is nonspecific given the moderate volume hemoperitoneum. Some additional nonspecific colonic mural thickening the distal transverse and splenic flexure as well. Vascular/Lymphatic: Atherosclerotic calcifications within the abdominal aorta and branch vessels. No aneurysm or ectasia. Active contrast extravasation arising from the upper pole spleen. Few prominent, likely reactive nodes present in the inguinal region. No other conspicuous enlarged adenopathy in the abdomen or pelvis. Reproductive: Prostatomegaly with indentation of the bladder base. Few coarse eccentric calcifications of the prostate and seminal vesicles, often senescent change. Some mild corporal calcifications are noted as well. Other: Moderate volume hemoperitoneum with sentinel clot in the left upper quadrant and active contrast extravasation arising from the superior pole of the spleen extending into the peritoneum proper. Bilateral fat containing inguinal hernias with small amount of fluid and partial herniation of the left anterolateral bladder into the left inguinal hernia sac. No bowel containing hernias. Focal skin thickening and subcutaneous fat stranding along the left inguinal crease with some adjacent reactive adenopathy.  Additional skin thickening and subcutaneous fat stranding along the gluteal cleft, right greater than left with some mild thickening and inflammation which extends to the levator plate and intersphincteric plane. No clear abscess. Salli Bodin fistulization is difficult to exclude on CT imaging. Inflammatory changes extend  below the margin of imaging within abscess in this vicinity seen on comparison pelvic CT. Additional enlarging subcutaneous soft tissue nodule along the medial right gluteal soft tissues superficially (2/86) measuring up to 1.8 by 0.9 cm, previously 0.8 x 0.4 cm. While this could reflect Foday Cone dermal inclusion cyst, should correlate with visual inspection. Musculoskeletal: No acute osseous abnormality or suspicious osseous lesion. Degenerative changes in the spine, hips and pelvis. IMPRESSION: 1. Active contrast extravasation arising from the superior pole/dome of the spleen contributing both to Angelis Gates large subcapsular splenic hematoma and moderate volume hemoperitoneum with sentinel clot in the left upper quadrant. 2. Slightly heterogeneous and nodular liver, could correlate for features of intrinsic liver disease/cirrhosis. Consider correlation with LFTs. 3. Skin thickening and subcutaneous fat stranding along the left inguinal crease concerning for Prabhleen Montemayor cellulitis with some adjacent reactive adenopathy. 4. More extensive skin thickening and subcutaneous phlegmon without organized abscess along the gluteal cleft, right greater than left with extension to the intersphincteric plane. No clear abscess though inflammatory changes extend below the margin of imaging. Furthermore, fistulization is difficult to exclude on CT imaging. Recommend correlation with visual inspection. 5. Small though slightly enlarging collection contiguous with the dermis along the more posterior right gluteal soft tissues. Could reflect Jaheim Canino small dermal inclusion cysts though should correlate with visual inspection. 6. Bilateral fat containing  inguinal hernias with small amount of fluid and partial herniation of the left anterolateral bladder into the left inguinal hernia sac. No bowel containing hernias. 7. 1 cm intermediate attenuation nodule in the left adrenal gland, incompletely characterized on this exam. Likely Jasminne Mealy benign adrenal adenoma though should consider with 1 year follow-up adrenal washout CT. This recommendation follows ACR consensus guidelines: Management of Incidental Adrenal Masses: Whalen Trompeter White Paper of the ACR Incidental Findings Committee. J Am Coll Radiol 2017;14:1038-1044. 8. Some patchy ground-glass opacity in the right lung base greater than left, possibly atelectasis versus atypical infection, difficult to delineate given extensive respiratory motion. 9. Aortic Atherosclerosis (ICD10-I70.0). Critical Value/emergent results were called by telephone at the time of interpretation on 04/12/2020 at 2:03 am to provider [facility] , who verbally acknowledged these results. Electronically Signed   By: Lovena Le M.D.   On: 04/12/2020 02:03   PERIPHERAL VASCULAR CATHETERIZATION  Result Date: 04/12/2020 See op note  DG Chest Portable 1 View  Result Date: 04/12/2020 CLINICAL DATA:  Sepsis EXAM: PORTABLE CHEST 1 VIEW COMPARISON:  09/13/2007 FINDINGS: Interstitial prominence within the lungs, most pronounced in the lower lobes. Heart is normal size. No effusions. No acute bony abnormality. IMPRESSION: Interstitial prominence predominantly in the lower lobes. This could reflect atypical infection. Electronically Signed   By: Rolm Baptise M.D.   On: 04/12/2020 00:36        Scheduled Meds: . Chlorhexidine Gluconate Cloth  6 each Topical Daily  . gabapentin  300 mg Oral QHS  . LORazepam  0-4 mg Intravenous Q6H   Followed by  . [START ON 04/14/2020] LORazepam  0-4 mg Intravenous Q12H   Continuous Infusions: . sodium chloride    . sodium chloride 100 mL/hr at 04/12/20 0514  . meropenem (MERREM) IV 1 g (04/12/20 0740)  .  vancomycin       LOS: 0 days    Time spent: over 30 min    Fayrene Helper, MD [facility]   To contact the attending provider between 7A-7P or the covering provider during after hours 7P-7A, please log into the web site [email or web address] and access using universal [place]  password for that web site. If you do not have the password, please call the hospital operator.  04/12/2020, 10:04 AM

## 2020-04-12 NOTE — Progress Notes (Signed)
Pharmacy Antibiotic Note  Caleb Brooks is a 59 y.o. male admitted on 04/12/2020 with sepsis secondary to cellulitis.  Pharmacy has been consulted for Vancomycin  dosing.  Plan: Vancomycin 1 gm IV X 1 given on 11/5 @ 0057. Additional Vancomycin 750 mg IV X 1 ordered to make total loading dose of 1750 mg.  Vancomycin 750 mg IV Q12H ordered to start on 11/5 @ 1300.   Height: 5\' 11"  (180.3 cm) Weight: 74.8 kg (164 lb 14.5 oz) IBW/kg (Calculated) : 75.3  Temp (24hrs), Avg:97.9 F (36.6 C), Min:97.1 F (36.2 C), Max:98.5 F (36.9 C)  Recent Labs  Lab 04/12/20 0016 04/12/20 0207  WBC 15.9*  --   CREATININE 1.59*  --   LATICACIDVEN 5.4* 6.1*    Estimated Creatinine Clearance: 54.2 mL/min (A) (by C-G formula based on SCr of 1.59 mg/dL (H)).    Allergies  Allergen Reactions  . Ibuprofen Other (See Comments)    Stomach upset    Antimicrobials this admission:  >>    >>   Dose adjustments this admission:   Microbiology results:  BCx:   UCx:    Sputum:    MRSA PCR:   Thank you for allowing pharmacy to be a part of this patient's care.  Tymesha Ditmore D 04/12/2020 5:45 AM

## 2020-04-12 NOTE — Op Note (Addendum)
[facility] Percutaneous Study/Intervention Procedural Note     Surgeon(s): [employer]  Assistants: none  Pre-operative Diagnosis: 1.  Splenic hemorrhage Post-operative diagnosis: Same  Procedure(s) Performed: 1. Ultrasound guidance for vascular access right femoral artery 2. Catheter placement into splenic artery from right femoral approach 3. Aortogram and selective angiogram of the celiac artery and of the splenic artery 4. Coil embolization of the splenic artery with a total of 6 Ruby coils 5. StarClose closure device right femoral artery  Anesthesia: Moderate conscious sedation for approximately 27 minutes using 1 mg of Versed and 50 mcg of Fentanyl  EBL: 5 cc  Fluoro Time: 4 minutes  Contrast: 35 cc  Indications: Patient is a 59 y.o.male with splenic hemorrhage and hemodynamic instability. The patient is brought in for angiography for further evaluation and potential treatment. Risks and benefits are discussed and informed consent is obtained  Procedure: The patient was identified and appropriate procedural time out was performed. The patient was then placed supine on the table and prepped and draped in the usual sterile fashion.Moderate conscious sedation was administered during a face to face encounter with the patient throughout the procedure with my supervision of the RN administering medicines and monitoring the patient's vital signs, pulse oximetry, telemetry and mental status throughout from the start of the procedure until the patient was taken to the recovery room.  Ultrasound was used to evaluate the right common femoral artery. It was patent . A digital ultrasound image was acquired. A Seldinger needle was used to access the right common femoral artery under direct ultrasound guidance and a permanent image was performed. A 0.035 J wire was advanced  without resistance and a 5Fr sheath was placed. Pigtail catheter was placed into the aorta and an AP aortogram was performed. This demonstrated relatively normal aorta and iliac arteries.  The renal arteries were normal.  The celiac and SMA could be seen but the origins were not well seen.   A C2 catheter was used to selectively cannulate the celiac artery.  This demonstrated the normal trifurcation of a splenic artery, hepatic artery, and left gastric artery.  I then advanced the Glidewire out the splenic artery advance the C2 catheter into the proximal splenic artery where selective imaging is performed showing a blush with a subcapsular hematoma in the spleen. Based on his continued bleeding and the CT scan findings particularly with hemodynamic instability I elected to treat this area with embolization. I initially advanced the Pro-Great microcatheter out the splenic artery into the superior of the 2 terminal branches and began coil embolization there.  A 4 mm diameter by 15 cm soft coil was started there.  A 4 mm diameter by 20 cm coil was then brought back into the distal main splenic artery.  A pair of 5 mm diameter by 30 cm length standard coils were then deployed in the mid and distal splenic artery with a packing coil placed next.  The final coil was a 6 mm x 30 cm standard coil in the proximal to mid splenic artery.  Selective imaging through the C2 catheter if the prograde microcatheter was removed showed minimal forward flow through the splenic artery at this point consistent with successful embolization.  I elected to terminate the procedure. The diagnostic catheter was removed. StarClose closure device was deployed in usual fashion with excellent hemostatic result. The patient was taken to the recovery room in stable condition having tolerated the procedure well.     Findings:Subcapsular hematoma  with bleeding from the spleen.  Disposition: Patient was taken to the recovery room in stable  condition having tolerated the procedure well.  Complications: None  Caleb Brooks 04/12/2020 5:40 AM   This note was created with Dragon Medical transcription system. Any errors in dictation are purely unintentional.

## 2020-04-12 NOTE — ED Notes (Signed)
EDP notified of critical lactic result. No new orders at this time

## 2020-04-12 NOTE — ED Notes (Signed)
Pt to CT

## 2020-04-12 NOTE — ED Triage Notes (Signed)
Pt arrives from home via [facility] with complaint of unresponsive/nonverbal 12 lead unremarkable. Pt arrives moaning/groaning, able to ask for blanket. Reports pain from pressure ulcers on buttocks  Initial BP 54/30 repeat BP 70/40 cbg 160

## 2020-04-12 NOTE — ED Provider Notes (Signed)
[facility] Emergency Department Provider Note  ____________________________________________  Time seen: Approximately 12:53 AM  I have reviewed the triage vital signs and the nursing notes.   HISTORY  Chief Complaint Hypotension  Level 5 caveat:  Portions of the history and physical were unable to be obtained due to poor historian   HPI Caleb Brooks is a 59 y.o. male with a history of hidradenitis suppurativa complicated by recurrent boils and keloid formation who presents for evaluation of syncope. Patient found by EMS hypotensive with BP 54/30. Patient reports that her boils in his perineum having been draining for a few days. He is complaining of severs pain, but reports that the pain is constant. He denies cough, sob, fever. He does reports LUQ abd pain that started today but unable to describe the pain to me.   Past Medical History:  Diagnosis Date  . Anxiety   . Arthritis   . Hypertension     Patient Active Problem List   Diagnosis Date Noted  . Splenic hemorrhage 04/12/2020  . Fatigue 02/22/2020  . Finger joint swelling, left 02/22/2020  . Exposure to herpes 09/27/2019  . Cyst of perianal area 07/26/2019  . Scrotal abscess 07/26/2019  . Onychomycosis 03/21/2019  . Callus of foot 02/07/2019  . Nail thickening 02/07/2019  . Encounter to establish care 02/07/2019  . Right middle ear infection 02/07/2019  . Right otitis media 02/07/2019  . Elevated blood pressure reading 02/07/2019  . Tobacco abuse 01/01/2015  . ETOH abuse 01/01/2015  . Arthritis 10/02/2014  . Hypertension 08/02/2014    Past Surgical History:  Procedure Laterality Date  . CYST REMOVAL TRUNK     Groin area around 2000  . INCISION AND DRAINAGE ABSCESS N/A 07/26/2019   Procedure: INCISION AND DRAINAGE ABSCESS;  Surgeon: Hollice Espy, MD;  Location: [facility] ORS;  Service: Urology;  Laterality: N/A;  . INCISION AND DRAINAGE ABSCESS N/A 07/26/2019   Procedure: INCISION AND  DRAINAGE ABSCESS;  Surgeon: Benjamine Sprague, DO;  Location: [facility] ORS;  Service: General;  Laterality: N/A;    Prior to Admission medications   Medication Sig Start Date End Date Taking? Authorizing Provider  celecoxib (CELEBREX) 200 MG capsule Take one tab po BID Patient taking differently: Take 200 mg by mouth 2 (two) times daily. Take one tab po BID 04/01/20  Yes Ralene Bathe, MD  doxycycline (VIBRA-TABS) 100 MG tablet Take one tab po BID x 2 weeks then decrease to QD there after. 04/01/20  Yes Ralene Bathe, MD  gabapentin (NEURONTIN) 300 MG capsule Take one cap po QHS Patient taking differently: Take 300 mg by mouth at bedtime. Take one cap po QHS 04/01/20  Yes Ralene Bathe, MD  celecoxib (CELEBREX) 100 MG capsule Take 1 capsule (100 mg total) by mouth 2 (two) times daily. As needed Patient not taking: Reported on 04/12/2020 02/29/20   Ralene Bathe, MD  gabapentin (NEURONTIN) 300 MG capsule Take 1 capsule (300 mg total) by mouth at bedtime. Patient not taking: Reported on 04/12/2020 02/29/20   Ralene Bathe, MD  neomycin-bacitracin-polymyxin (NEOSPORIN) OINT Apply 1 application topically as needed for wound care. Patient not taking: Reported on 04/01/2020 02/22/20   Iloabachie, Chioma E, NP    Allergies Ibuprofen  Family History  Problem Relation Age of Onset  . Diabetes Father   . Hypertension Father   . Congestive Heart Failure Father   . Hypertension Sister   . CVA Sister   . Bipolar disorder  Brother   . Schizophrenia Brother     Social History Social History   Tobacco Use  . Smoking status: Current Every Day Smoker    Packs/day: 0.50    Years: 30.00    Pack years: 15.00    Types: Cigarettes  . Smokeless tobacco: Never Used  . Tobacco comment: 1/2- 1 pack per day  Vaping Use  . Vaping Use: Never used  Substance Use Topics  . Alcohol use: Yes    Comment: 32 oz a day  . Drug use: Yes    Types: Marijuana    Review of Systems  Constitutional:  Negative for fever. + syncope Eyes: Negative for visual changes. ENT: Negative for sore throat. Neck: No neck pain  Cardiovascular: Negative for chest pain. Respiratory: Negative for shortness of breath. Gastrointestinal: + LUQ abdominal pain. No vomiting Genitourinary: Negative for dysuria. + perineal abscess draining Musculoskeletal: Negative for back pain. Skin: Negative for rash. Neurological: Negative for headaches, weakness or numbness. Psych: No SI or HI  ____________________________________________   PHYSICAL EXAM:  VITAL SIGNS: ED Triage Vitals  Enc Vitals Group     BP 04/12/20 0019 100/84     Pulse Rate 04/12/20 0019 78     Resp 04/12/20 0019 (!) 22     Temp 04/12/20 0025 (!) 97.1 F (36.2 C)     Temp Source 04/12/20 0025 Rectal     SpO2 04/12/20 0019 97 %     Weight 04/12/20 0013 164 lb 14.5 oz (74.8 kg)     Height 04/12/20 0013 5\' 11"  (1.803 m)     Head Circumference --      Peak Flow --      Pain Score 04/12/20 0019 7     Pain Loc --      Pain Edu? --      Excl. in [place]? --     Constitutional: Alert and oriented, chronically ill appearing.  HEENT:      Head: Normocephalic and atraumatic.         Eyes: Conjunctivae are normal. Sclera is non-icteric.       Mouth/Throat: Mucous membranes are dry.       Neck: Supple with no signs of meningismus. Cardiovascular: Regular rate and rhythm. No murmurs, gallops, or rubs.  Respiratory: Normal respiratory effort. Lungs are clear to auscultation bilaterally.  Gastrointestinal: Soft, tender to palpation over the LUQ with no rebound or guarding. Genitourinary: Induration of the perineum, with significant tenderness and warm to the touch, and purulent drainage from an open abscess, no crepitus or bullae Musculoskeletal: No edema, cyanosis, or erythema of extremities. Neurologic: Normal speech and language. Face is symmetric. Moving all extremities. No gross focal neurologic deficits are appreciated. Skin: Skin is warm,  dry and intact. No rash noted. Psychiatric: Mood and affect are normal. Speech and behavior are normal.  ____________________________________________   LABS (all labs ordered are listed, but only abnormal results are displayed)  Labs Reviewed  CBC WITH DIFFERENTIAL/PLATELET - Abnormal; Notable for the following components:      Result Value   WBC 15.9 (*)    RBC 3.48 (*)    Hemoglobin 10.9 (*)    HCT 33.5 (*)    Neutro Abs 13.4 (*)    Abs Immature Granulocytes 0.36 (*)    All other components within normal limits  COMPREHENSIVE METABOLIC PANEL - Abnormal; Notable for the following components:   Glucose, Bld 197 (*)    Creatinine, Ser 1.59 (*)    Calcium 8.7 (*)  Albumin 3.0 (*)    GFR, Estimated 50 (*)    All other components within normal limits  LACTIC ACID, PLASMA - Abnormal; Notable for the following components:   Lactic Acid, Venous 5.4 (*)    All other components within normal limits  LACTIC ACID, PLASMA - Abnormal; Notable for the following components:   Lactic Acid, Venous 6.1 (*)    All other components within normal limits  URINE DRUG SCREEN, QUALITATIVE ([facility] ONLY) - Abnormal; Notable for the following components:   Cocaine Metabolite,Ur [place] POSITIVE (*)    Cannabinoid 50 Ng, Ur [place] POSITIVE (*)    All other components within normal limits  URINALYSIS, COMPLETE (UACMP) WITH MICROSCOPIC - Abnormal; Notable for the following components:   Color, Urine YELLOW (*)    APPearance CLOUDY (*)    Specific Gravity, Urine >1.046 (*)    All other components within normal limits  RESPIRATORY PANEL BY RT PCR (FLU A&B, COVID)  CULTURE, BLOOD (ROUTINE X 2)  CULTURE, BLOOD (ROUTINE X 2)  PROCALCITONIN  ETHANOL  RAPID HIV SCREEN (HIV 1/2 AB+AG)  MAGNESIUM  PROTIME-INR  APTT  HEPATITIS B SURFACE ANTIGEN  HEPATITIS B SURFACE ANTIBODY, QUANTITATIVE  HEPATITIS C ANTIBODY  TYPE AND SCREEN  PREPARE RBC (CROSSMATCH)  ABO/RH  PREPARE RBC (CROSSMATCH)    ____________________________________________  EKG   ED ECG REPORT I, Rudene Re, the attending physician, personally viewed and interpreted this ECG.  Sinus rhythm, rate of 71, prolonged QTC, normal axis, no ST elevations. No prior for comparison. ____________________________________________  RADIOLOGY  I have personally reviewed the images performed during this visit and I agree with the Radiologist's read.   Interpretation by Radiologist:  CT ABDOMEN PELVIS W CONTRAST  Result Date: 04/12/2020 CLINICAL DATA:  Abdominal pain, unresponsive and nonverbal EXAM: CT ABDOMEN AND PELVIS WITH CONTRAST TECHNIQUE: Multidetector CT imaging of the abdomen and pelvis was performed using the standard protocol following bolus administration of intravenous contrast. CONTRAST:  157mL OMNIPAQUE IOHEXOL 300 MG/ML  SOLN COMPARISON:  Same day chest radiograph, CT pelvis 07/26/2019 FINDINGS: Lower chest: Evaluation of the lungs complicated by extensive respiratory motion artifact. Some patchy ground-glass opacity could reflect atelectatic changes or less likely infection. Normal heart size. No pericardial effusion. Few coronary artery calcifications. Hepatobiliary: Mild heterogeneity of the hepatic attenuation with a slightly nodular liver surface contour. No focal liver lesion. Moderate distention of the gallbladder. No significant gallbladder wall thickening. Pericholecystic fluid is likely redistributed from the larger volume hemoperitoneum throughout the abdomen. No visible calcified gallstones or biliary dilatation. Pancreas: No pancreatic ductal dilatation or surrounding inflammatory changes. Spleen: Partial compression of the spleen by a large subcapsular hematoma measuring approximately 8 x 6.2 x 11.7 cm in size with a blush of active contrast extravasation arising from the superior spleen and extending into the peritoneum with sentinel clot sign throughout the left upper quadrant. Adrenals/Urinary  Tract: 1 cm intermediate attenuation nodule in left adrenal gland. Kidneys are normally located with symmetric enhancement and excretion. No suspicious renal lesion, urolithiasis or hydronephrosis. Some mild circumferential thickening of the bladder is present. There is a left bladder ear deviating towards a left inguinal hernia which contains some fat hemoperitoneum as well. Stomach/Bowel: Sliding-type hiatal hernia. Stomach is unremarkable. Mild wall thickening of the small bowel including the duodenum is nonspecific given the moderate volume hemoperitoneum. Some additional nonspecific colonic mural thickening the distal transverse and splenic flexure as well. Vascular/Lymphatic: Atherosclerotic calcifications within the abdominal aorta and branch vessels. No aneurysm or ectasia. Active  contrast extravasation arising from the upper pole spleen. Few prominent, likely reactive nodes present in the inguinal region. No other conspicuous enlarged adenopathy in the abdomen or pelvis. Reproductive: Prostatomegaly with indentation of the bladder base. Few coarse eccentric calcifications of the prostate and seminal vesicles, often senescent change. Some mild corporal calcifications are noted as well. Other: Moderate volume hemoperitoneum with sentinel clot in the left upper quadrant and active contrast extravasation arising from the superior pole of the spleen extending into the peritoneum proper. Bilateral fat containing inguinal hernias with small amount of fluid and partial herniation of the left anterolateral bladder into the left inguinal hernia sac. No bowel containing hernias. Focal skin thickening and subcutaneous fat stranding along the left inguinal crease with some adjacent reactive adenopathy. Additional skin thickening and subcutaneous fat stranding along the gluteal cleft, right greater than left with some mild thickening and inflammation which extends to the levator plate and intersphincteric plane. No clear  abscess. A fistulization is difficult to exclude on CT imaging. Inflammatory changes extend below the margin of imaging within abscess in this vicinity seen on comparison pelvic CT. Additional enlarging subcutaneous soft tissue nodule along the medial right gluteal soft tissues superficially (2/86) measuring up to 1.8 by 0.9 cm, previously 0.8 x 0.4 cm. While this could reflect a dermal inclusion cyst, should correlate with visual inspection. Musculoskeletal: No acute osseous abnormality or suspicious osseous lesion. Degenerative changes in the spine, hips and pelvis. IMPRESSION: 1. Active contrast extravasation arising from the superior pole/dome of the spleen contributing both to a large subcapsular splenic hematoma and moderate volume hemoperitoneum with sentinel clot in the left upper quadrant. 2. Slightly heterogeneous and nodular liver, could correlate for features of intrinsic liver disease/cirrhosis. Consider correlation with LFTs. 3. Skin thickening and subcutaneous fat stranding along the left inguinal crease concerning for a cellulitis with some adjacent reactive adenopathy. 4. More extensive skin thickening and subcutaneous phlegmon without organized abscess along the gluteal cleft, right greater than left with extension to the intersphincteric plane. No clear abscess though inflammatory changes extend below the margin of imaging. Furthermore, fistulization is difficult to exclude on CT imaging. Recommend correlation with visual inspection. 5. Small though slightly enlarging collection contiguous with the dermis along the more posterior right gluteal soft tissues. Could reflect a small dermal inclusion cysts though should correlate with visual inspection. 6. Bilateral fat containing inguinal hernias with small amount of fluid and partial herniation of the left anterolateral bladder into the left inguinal hernia sac. No bowel containing hernias. 7. 1 cm intermediate attenuation nodule in the left adrenal  gland, incompletely characterized on this exam. Likely a benign adrenal adenoma though should consider with 1 year follow-up adrenal washout CT. This recommendation follows ACR consensus guidelines: Management of Incidental Adrenal Masses: A White Paper of the ACR Incidental Findings Committee. J Am Coll Radiol 2017;14:1038-1044. 8. Some patchy ground-glass opacity in the right lung base greater than left, possibly atelectasis versus atypical infection, difficult to delineate given extensive respiratory motion. 9. Aortic Atherosclerosis (ICD10-I70.0). Critical Value/emergent results were called by telephone at the time of interpretation on 04/12/2020 at 2:03 am to provider [facility] , who verbally acknowledged these results. Electronically Signed   By: Lovena Le M.D.   On: 04/12/2020 02:03   DG Chest Portable 1 View  Result Date: 04/12/2020 CLINICAL DATA:  Sepsis EXAM: PORTABLE CHEST 1 VIEW COMPARISON:  09/13/2007 FINDINGS: Interstitial prominence within the lungs, most pronounced in the lower lobes. Heart is normal size. No effusions.  No acute bony abnormality. IMPRESSION: Interstitial prominence predominantly in the lower lobes. This could reflect atypical infection. Electronically Signed   By: Rolm Baptise M.D.   On: 04/12/2020 00:36     ____________________________________________   PROCEDURES  Procedure(s) performed:yes .1-3 Lead EKG Interpretation Performed by: Rudene Re, MD Authorized by: Rudene Re, MD     Interpretation: non-specific     ECG rate assessment: normal     Rhythm: sinus rhythm     Ectopy: none     Critical Care performed: yes  CRITICAL CARE Performed by: Rudene Re  ?  Total critical care time: 45 min  Critical care time was exclusive of separately billable procedures and treating other patients.  Critical care was necessary to treat or prevent imminent or life-threatening deterioration.  Critical care was time spent  personally by me on the following activities: development of treatment plan with patient and/or surrogate as well as nursing, discussions with consultants, evaluation of patient's response to treatment, examination of patient, obtaining history from patient or surrogate, ordering and performing treatments and interventions, ordering and review of laboratory studies, ordering and review of radiographic studies, pulse oximetry and re-evaluation of patient's condition.  ____________________________________________   INITIAL IMPRESSION / ASSESSMENT AND PLAN / ED COURSE  59 y.o. male with a history of hidradenitis suppurativa complicated by recurrent boils and keloid formation who presents for evaluation of syncope. Patient hypotensive and hypothermic, with active purulent discharge, warmth and induration of the perineum. CT pending to rule out necrotizing infection. Sepsis protocol initiated. Syncope most likely due to hypotension although patient does have prolonged QTc on EKG with no prior for comparison. Placed on telemetry for close monitoring. K normal, mag pending. Labs showing leukocytosis with left shift, mild AKI, and new anemia. Stool brown guaiac negative. Patient has noticed intermittent bleeding from the perineum but denies bloody stools or melena. Not anticoagulated. Type and screen sent. No indication for transfusion at this time. Sepsis protocol initiated. Procalcitonin and lactic pending. Cultures sent. Patient given zosyn and vancomycin. Covid and flu pending. Old medical records reviewed including recent visit with derm for management of hidradenitis suppurativa 10 days ago.   _________________________ 1:52 AM on 04/12/2020 -----------------------------------------  CT visualized by me showing hemoperitoneum.  Spoke with the radiologist who reports active splenic hemorrhage. Spoke with Dr. Lysle Pearl from surgery who will eval patient now in the ED. He also recommended discussing it with  vascular for possible emergent embolization. Dr. Lucky Cowboy paged. Will start emergent release blood transfusion. Will give 1000mg  of IV TXA. Dr. Lucky Cowboy agrees with giving patient that.   _________________________ 2:07 AM on 04/12/2020 -----------------------------------------  Spoke with Dr. Lucky Cowboy from vascular who agrees that patient needs emergent embolization. He will call the team in. In the meantime, he recommended continuing medical resuscitation with blood.     _________________________ 2:49 AM on 04/12/2020 -----------------------------------------  Patient received TXA and finishing 2U of pRBCs, BP stable 114/83.   _________________________ 3:07 AM on 04/12/2020 -----------------------------------------  Blood transfusion done. BP stable 122/95. Dr. Lucky Cowboy awaiting Covid results for surgical intervention.   _________________________ 3:13 AM on 04/12/2020 -----------------------------------------  Covid negative. Dr. Lucky Cowboy aware. Patient awaiting for OR.  _________________________ 3:45 AM on 04/12/2020 -----------------------------------------  Dr. Lucky Cowboy at bedside talking to patient.   _________________________ 4:13 AM on 04/12/2020 -----------------------------------------  Patient still awaiting to go to vascular suite. Techs for the vascular suite still not in the hospital. House supervisor has been involved for several hours trying to get staff to the hospital.  Dr. Lucky Cowboy aware. BP remains stable at 100/68. Will give a 3rd unit of pRBC since it is not clear when patient will be able to go to vascular suite.  _________________________ 4:58 AM on 04/12/2020 -----------------------------------------  Patient just left ED to vascular suite with 3rd unit of pRBC started.   _____________________________________________ Please note:  Patient was evaluated in Emergency Department today for the symptoms described in the history of present illness. Patient was evaluated in the context of  the global COVID-19 pandemic, which necessitated consideration that the patient might be at risk for infection with the SARS-CoV-2 virus that causes COVID-19. Institutional protocols and algorithms that pertain to the evaluation of patients at risk for COVID-19 are in a state of rapid change based on information released by regulatory bodies including the CDC and federal and state organizations. These policies and algorithms were followed during the patient's care in the ED.  Some ED evaluations and interventions may be delayed as a result of limited staffing during the pandemic.   [place] Controlled Substance Database was reviewed by me. ____________________________________________   FINAL CLINICAL IMPRESSION(S) / ED DIAGNOSES   Final diagnoses:  Splenic hemorrhage  Hemoperitoneum  Hemorrhagic shock (HCC)  Syncope, unspecified syncope type      NEW MEDICATIONS STARTED DURING THIS VISIT:  ED Discharge Orders    None       Note:  This document was prepared using Dragon voice recognition software and may include unintentional dictation errors.    Rudene Re, MD 04/12/20 [phone]

## 2020-04-12 NOTE — Consult Note (Signed)
[facility] Vascular Consult Note  MRN : [id number]  Caleb Brooks is a 59 y.o. (August 10, 1960) male who presents with chief complaint of  Chief Complaint  Patient presents with  . Hypotension  .  History of Present Illness: I am asked to see the patient by Dr. Alfred Levins in the [facility] emergency department for spontaneous splenic rupture.  The patient had a syncopal episode today and fell from standing.  He has been having abdominal pain and came to the emergency room where he was found to have a large amount of hemoperitoneum and bleeding from his spleen on the CT angiogram which I have independently reviewed.  Still complaining of abdominal pain.  He had a 5 g drop in his hemoglobin from his most recent outpatient check.  He was hypotensive with pressures in the 60s initially but has stabilized with blood and clotting products.  Pressures now 1 76-7 20 systolic.    Current Facility-Administered Medications  Medication Dose Route Frequency Provider Last Rate Last Admin  . ceFAZolin (ANCEF) 2-4 GM/100ML-% IVPB           . fentaNYL (SUBLIMAZE) 100 MCG/2ML injection           . midazolam (VERSED) 5 MG/5ML injection            Current Outpatient Medications  Medication Sig Dispense Refill  . celecoxib (CELEBREX) 200 MG capsule Take one tab po BID (Patient taking differently: Take 200 mg by mouth 2 (two) times daily. Take one tab po BID) 60 capsule 3  . doxycycline (VIBRA-TABS) 100 MG tablet Take one tab po BID x 2 weeks then decrease to QD there after. 60 tablet 3  . gabapentin (NEURONTIN) 300 MG capsule Take one cap po QHS (Patient taking differently: Take 300 mg by mouth at bedtime. Take one cap po QHS) 30 capsule 3  . celecoxib (CELEBREX) 100 MG capsule Take 1 capsule (100 mg total) by mouth 2 (two) times daily. As needed (Patient not taking: Reported on 04/12/2020) 60 capsule 0  . gabapentin (NEURONTIN) 300 MG capsule Take 1 capsule (300 mg total) by mouth at bedtime.  (Patient not taking: Reported on 04/12/2020) 30 capsule 0  . neomycin-bacitracin-polymyxin (NEOSPORIN) OINT Apply 1 application topically as needed for wound care. (Patient not taking: Reported on 04/01/2020) 30 g 0    Past Medical History:  Diagnosis Date  . Anxiety   . Arthritis   . Hypertension     Past Surgical History:  Procedure Laterality Date  . CYST REMOVAL TRUNK     Groin area around 2000  . INCISION AND DRAINAGE ABSCESS N/A 07/26/2019   Procedure: INCISION AND DRAINAGE ABSCESS;  Surgeon: Hollice Espy, MD;  Location: [facility] ORS;  Service: Urology;  Laterality: N/A;  . INCISION AND DRAINAGE ABSCESS N/A 07/26/2019   Procedure: INCISION AND DRAINAGE ABSCESS;  Surgeon: Benjamine Sprague, DO;  Location: [facility] ORS;  Service: General;  Laterality: N/A;     Social History   Tobacco Use  . Smoking status: Current Every Day Smoker    Packs/day: 0.50    Years: 30.00    Pack years: 15.00    Types: Cigarettes  . Smokeless tobacco: Never Used  . Tobacco comment: 1/2- 1 pack per day  Vaping Use  . Vaping Use: Never used  Substance Use Topics  . Alcohol use: Yes    Comment: 32 oz a day  . Drug use: Yes    Types: Marijuana     Family  History  Problem Relation Age of Onset  . Diabetes Father   . Hypertension Father   . Congestive Heart Failure Father   . Hypertension Sister   . CVA Sister   . Bipolar disorder Brother   . Schizophrenia Brother     Allergies  Allergen Reactions  . Ibuprofen Other (See Comments)    Stomach upset     REVIEW OF SYSTEMS (Negative unless checked)  Constitutional: [] Weight loss  [] Fever  [] Chills Cardiac: [] Chest pain   [] Chest pressure   [] Palpitations   [] Shortness of breath when laying flat   [] Shortness of breath at rest   [] Shortness of breath with exertion. Vascular:  [] Pain in legs with walking   [] Pain in legs at rest   [] Pain in legs when laying flat   [] Claudication   [] Pain in feet when walking  [] Pain in feet at rest  [] Pain in  feet when laying flat   [] History of DVT   [] Phlebitis   [] Swelling in legs   [] Varicose veins   [] Non-healing ulcers Pulmonary:   [] Uses home oxygen   [] Productive cough   [] Hemoptysis   [] Wheeze  [] COPD   [] Asthma Neurologic:  [] Dizziness  [] Blackouts   [] Seizures   [] History of stroke   [] History of TIA  [] Aphasia   [] Temporary blindness   [] Dysphagia   [] Weakness or numbness in arms   [] Weakness or numbness in legs Musculoskeletal:  [] Arthritis   [] Joint swelling   [] Joint pain   [] Low back pain Hematologic:  [] Easy bruising  [] Easy bleeding   [] Hypercoagulable state   [] Anemic  [] Hepatitis Gastrointestinal:  [] Blood in stool   [] Vomiting blood  [] Gastroesophageal reflux/heartburn   [] Difficulty swallowing. X positive for abdominal pain Genitourinary:  [] Chronic kidney disease   [] Difficult urination  [] Frequent urination  [] Burning with urination   [] Blood in urine Skin:  [] Rashes   [] Ulcers   [] Wounds Psychological:  [] History of anxiety   []  History of major depression.  Physical Examination  Vitals:   04/12/20 0347 04/12/20 0350 04/12/20 0355 04/12/20 0357  BP: 98/77 (!) 108/57 114/68   Pulse: 94 92 96   Resp: (!) 27 (!) 24 (!) 26   Temp:    98.5 F (36.9 C)  TempSrc:    Oral  SpO2: 97% 96% 97%   Weight:      Height:       Body mass index is 23 kg/m. Gen:  WD/WN, NAD Head: [place]/AT, No temporalis wasting.  Ear/Nose/Throat: Hearing grossly intact, nares w/o erythema or drainage, oropharynx w/o Erythema/Exudate Eyes: Sclera non-icteric, conjunctiva clear Neck: Trachea midline.  No JVD.  Pulmonary:  Good air movement, respirations not labored, equal bilaterally.  Cardiac: Slightly tachycardic but regular Vascular:  Vessel Right Left  Radial Palpable Palpable                                   Gastrointestinal: Abdomen is somewhat distended and tender Musculoskeletal: M/S 5/5 throughout.  Extremities without ischemic changes.  No deformity or atrophy. No  edema. Neurologic: Sensation grossly intact in extremities.  Symmetrical.  Speech is fluent. Motor exam as listed above. Psychiatric: Judgment intact, Mood & affect appropriate for pt's clinical situation. Dermatologic: No rashes or ulcers noted.  No cellulitis or open wounds.       CBC Lab Results  Component Value Date   WBC 15.9 (H) 04/12/2020   HGB 10.9 (L) 04/12/2020   HCT  33.5 (L) 04/12/2020   MCV 96.3 04/12/2020   PLT 292 04/12/2020    BMET    Component Value Date/Time   NA 139 04/12/2020 0016   NA 140 03/14/2020 1842   K 3.7 04/12/2020 0016   CL 103 04/12/2020 0016   CO2 23 04/12/2020 0016   GLUCOSE 197 (H) 04/12/2020 0016   BUN 13 04/12/2020 0016   BUN 14 03/14/2020 1842   CREATININE 1.59 (H) 04/12/2020 0016   CALCIUM 8.7 (L) 04/12/2020 0016   GFRNONAA 50 (L) 04/12/2020 0016   GFRAA 81 03/14/2020 1842   Estimated Creatinine Clearance: 54.2 mL/min (A) (by C-G formula based on SCr of 1.59 mg/dL (H)).  COAG Lab Results  Component Value Date   INR 1.2 04/12/2020    Radiology CT ABDOMEN PELVIS W CONTRAST  Result Date: 04/12/2020 CLINICAL DATA:  Abdominal pain, unresponsive and nonverbal EXAM: CT ABDOMEN AND PELVIS WITH CONTRAST TECHNIQUE: Multidetector CT imaging of the abdomen and pelvis was performed using the standard protocol following bolus administration of intravenous contrast. CONTRAST:  178mL OMNIPAQUE IOHEXOL 300 MG/ML  SOLN COMPARISON:  Same day chest radiograph, CT pelvis 07/26/2019 FINDINGS: Lower chest: Evaluation of the lungs complicated by extensive respiratory motion artifact. Some patchy ground-glass opacity could reflect atelectatic changes or less likely infection. Normal heart size. No pericardial effusion. Few coronary artery calcifications. Hepatobiliary: Mild heterogeneity of the hepatic attenuation with a slightly nodular liver surface contour. No focal liver lesion. Moderate distention of the gallbladder. No significant gallbladder wall  thickening. Pericholecystic fluid is likely redistributed from the larger volume hemoperitoneum throughout the abdomen. No visible calcified gallstones or biliary dilatation. Pancreas: No pancreatic ductal dilatation or surrounding inflammatory changes. Spleen: Partial compression of the spleen by a large subcapsular hematoma measuring approximately 8 x 6.2 x 11.7 cm in size with a blush of active contrast extravasation arising from the superior spleen and extending into the peritoneum with sentinel clot sign throughout the left upper quadrant. Adrenals/Urinary Tract: 1 cm intermediate attenuation nodule in left adrenal gland. Kidneys are normally located with symmetric enhancement and excretion. No suspicious renal lesion, urolithiasis or hydronephrosis. Some mild circumferential thickening of the bladder is present. There is a left bladder ear deviating towards a left inguinal hernia which contains some fat hemoperitoneum as well. Stomach/Bowel: Sliding-type hiatal hernia. Stomach is unremarkable. Mild wall thickening of the small bowel including the duodenum is nonspecific given the moderate volume hemoperitoneum. Some additional nonspecific colonic mural thickening the distal transverse and splenic flexure as well. Vascular/Lymphatic: Atherosclerotic calcifications within the abdominal aorta and branch vessels. No aneurysm or ectasia. Active contrast extravasation arising from the upper pole spleen. Few prominent, likely reactive nodes present in the inguinal region. No other conspicuous enlarged adenopathy in the abdomen or pelvis. Reproductive: Prostatomegaly with indentation of the bladder base. Few coarse eccentric calcifications of the prostate and seminal vesicles, often senescent change. Some mild corporal calcifications are noted as well. Other: Moderate volume hemoperitoneum with sentinel clot in the left upper quadrant and active contrast extravasation arising from the superior pole of the spleen  extending into the peritoneum proper. Bilateral fat containing inguinal hernias with small amount of fluid and partial herniation of the left anterolateral bladder into the left inguinal hernia sac. No bowel containing hernias. Focal skin thickening and subcutaneous fat stranding along the left inguinal crease with some adjacent reactive adenopathy. Additional skin thickening and subcutaneous fat stranding along the gluteal cleft, right greater than left with some mild thickening and inflammation which extends  to the levator plate and intersphincteric plane. No clear abscess. A fistulization is difficult to exclude on CT imaging. Inflammatory changes extend below the margin of imaging within abscess in this vicinity seen on comparison pelvic CT. Additional enlarging subcutaneous soft tissue nodule along the medial right gluteal soft tissues superficially (2/86) measuring up to 1.8 by 0.9 cm, previously 0.8 x 0.4 cm. While this could reflect a dermal inclusion cyst, should correlate with visual inspection. Musculoskeletal: No acute osseous abnormality or suspicious osseous lesion. Degenerative changes in the spine, hips and pelvis. IMPRESSION: 1. Active contrast extravasation arising from the superior pole/dome of the spleen contributing both to a large subcapsular splenic hematoma and moderate volume hemoperitoneum with sentinel clot in the left upper quadrant. 2. Slightly heterogeneous and nodular liver, could correlate for features of intrinsic liver disease/cirrhosis. Consider correlation with LFTs. 3. Skin thickening and subcutaneous fat stranding along the left inguinal crease concerning for a cellulitis with some adjacent reactive adenopathy. 4. More extensive skin thickening and subcutaneous phlegmon without organized abscess along the gluteal cleft, right greater than left with extension to the intersphincteric plane. No clear abscess though inflammatory changes extend below the margin of imaging.  Furthermore, fistulization is difficult to exclude on CT imaging. Recommend correlation with visual inspection. 5. Small though slightly enlarging collection contiguous with the dermis along the more posterior right gluteal soft tissues. Could reflect a small dermal inclusion cysts though should correlate with visual inspection. 6. Bilateral fat containing inguinal hernias with small amount of fluid and partial herniation of the left anterolateral bladder into the left inguinal hernia sac. No bowel containing hernias. 7. 1 cm intermediate attenuation nodule in the left adrenal gland, incompletely characterized on this exam. Likely a benign adrenal adenoma though should consider with 1 year follow-up adrenal washout CT. This recommendation follows ACR consensus guidelines: Management of Incidental Adrenal Masses: A White Paper of the ACR Incidental Findings Committee. J Am Coll Radiol 2017;14:1038-1044. 8. Some patchy ground-glass opacity in the right lung base greater than left, possibly atelectasis versus atypical infection, difficult to delineate given extensive respiratory motion. 9. Aortic Atherosclerosis (ICD10-I70.0). Critical Value/emergent results were called by telephone at the time of interpretation on 04/12/2020 at 2:03 am to provider [facility] , who verbally acknowledged these results. Electronically Signed   By: Lovena Le M.D.   On: 04/12/2020 02:03   DG Chest Portable 1 View  Result Date: 04/12/2020 CLINICAL DATA:  Sepsis EXAM: PORTABLE CHEST 1 VIEW COMPARISON:  09/13/2007 FINDINGS: Interstitial prominence within the lungs, most pronounced in the lower lobes. Heart is normal size. No effusions. No acute bony abnormality. IMPRESSION: Interstitial prominence predominantly in the lower lobes. This could reflect atypical infection. Electronically Signed   By: Rolm Baptise M.D.   On: 04/12/2020 00:36      Assessment/Plan 1.  Splenic rupture.  Spontaneous or from a fall from standing.  CT  angiogram reviewed.  Have been asked to perform embolization on the patient and we will plan on doing that this morning. 2.  History of hidradenitis.  Stable at baseline.  No signs of systemic infection. 3.  Hypertension.  Had hypotension on presentation.  Stable now.  Can hold antihypertensives.   Leotis Pain, MD  04/12/2020 4:00 AM    This note was created with Dragon medical transcription system.  Any error is purely unintentional

## 2020-04-12 NOTE — ED Notes (Addendum)
Pt reports pain to left ribs. Denies CP or trouble breathing.   Pt with sores across perineal area. Fecal matter observed on cheeks/anus.  Pt reports feeling extremely cold. Several blankets applied per rectal temp

## 2020-04-12 NOTE — Consult Note (Signed)
Subjective:   CC: splenic bleed and hidradinitis  HPI:  Caleb Brooks is a 59 y.o. male who was consulted by [facility] for evaluation of above.  LUQ pain developed couple days ago, sudden onset.  Had syncope episode so brought into ED, where active splenic bleed noted during workup.   Hx of hidradinitis treated by dermatology as outpt.  Pt states pain and drainage is chronic.   Past Medical History:  has a past medical history of Anxiety, Arthritis, and Hypertension.  Past Surgical History:  has a past surgical history that includes Cyst removal trunk; Incision and drainage abscess (N/A, 07/26/2019); Incision and drainage abscess (N/A, 07/26/2019); and EMBOLIZATION (N/A, 04/12/2020).  Family History: family history includes Bipolar disorder in his brother; CVA in his sister; Congestive Heart Failure in his father; Diabetes in his father; Hypertension in his father and sister; Schizophrenia in his brother.  Social History:  reports that he has been smoking cigarettes. He has a 15.00 pack-year smoking history. He has never used smokeless tobacco. He reports current alcohol use. He reports current drug use. Drug: Marijuana.  Current Medications:  Prior to Admission medications   Medication Sig Start Date End Date Taking? Authorizing Provider  celecoxib (CELEBREX) 200 MG capsule Take one tab po BID Patient taking differently: Take 200 mg by mouth 2 (two) times daily. Take one tab po BID 04/01/20  Yes Ralene Bathe, MD  doxycycline (VIBRA-TABS) 100 MG tablet Take one tab po BID x 2 weeks then decrease to QD there after. 04/01/20  Yes Ralene Bathe, MD  gabapentin (NEURONTIN) 300 MG capsule Take one cap po QHS Patient taking differently: Take 300 mg by mouth at bedtime. Take one cap po QHS 04/01/20  Yes Ralene Bathe, MD  celecoxib (CELEBREX) 100 MG capsule Take 1 capsule (100 mg total) by mouth 2 (two) times daily. As needed Patient not taking: Reported on 04/12/2020 02/29/20   Ralene Bathe, MD  gabapentin (NEURONTIN) 300 MG capsule Take 1 capsule (300 mg total) by mouth at bedtime. Patient not taking: Reported on 04/12/2020 02/29/20   Ralene Bathe, MD  neomycin-bacitracin-polymyxin (NEOSPORIN) OINT Apply 1 application topically as needed for wound care. Patient not taking: Reported on 04/01/2020 02/22/20   Caryl Asp E, NP    Allergies:  Allergies  Allergen Reactions  . Ibuprofen Other (See Comments)    Stomach upset    ROS:  General: Denies weight loss, weight gain, fatigue, fevers, chills, and night sweats. Eyes: Denies blurry vision, double vision, eye pain, itchy eyes, and tearing. Ears: Denies hearing loss, earache, and ringing in ears. Nose: Denies sinus pain, congestion, infections, runny nose, and nosebleeds. Mouth/throat: Denies hoarseness, sore throat, bleeding gums, and difficulty swallowing. Heart: Denies chest pain, palpitations, racing heart, irregular heartbeat, leg pain or swelling, and decreased activity tolerance. Respiratory: Denies breathing difficulty, shortness of breath, wheezing, cough, and sputum. GI: Denies change in appetite, heartburn, nausea, vomiting, constipation, diarrhea, and blood in stool. GU: Denies difficulty urinating, pain with urinating, urgency, frequency, blood in urine. Musculoskeletal: Denies joint stiffness, pain, swelling, muscle weakness. Skin: Denies rash, itching, mass, tumors, sores, and boils Neurologic: Denies headache, fainting, dizziness, seizures, numbness, and tingling. Psychiatric: Denies depression, anxiety, difficulty sleeping, and memory loss. Endocrine: Denies heat or cold intolerance, and increased thirst or urination. Blood/lymph: Denies easy bruising, easy bruising, and swollen glands     Objective:     BP (!) 152/115   Pulse 89   Temp (!) 96.3 F (  35.7 C) (Axillary)   Resp (!) 21   Ht 5\' 11"  (1.803 m)   Wt 76.4 kg   SpO2 95%   BMI 23.49 kg/m   Constitutional :  alert,  cooperative, appears stated age and no distress  Lymphatics/Throat:  no asymmetry, masses, or scars  Respiratory:  clear to auscultation bilaterally  Cardiovascular:  regular rate and rhythm  Gastrointestinal: soft, no guarding, focal TTP in LUQ as expected. .  Musculoskeletal: Steady gait and movement  Skin: Cool and moist. Chronic hidradenitis changes with scant purulent drainage from one of sites on right groin, with some TTP.  No extensive erythema or induration noted to indicate worsening infection.  Psychiatric: Normal affect, non-agitated, not confused       LABS:  CMP Latest Ref Rng & Units 04/12/2020 04/12/2020 03/14/2020  Glucose 70 - 99 mg/dL 204(H) 197(H) 113(H)  BUN 6 - 20 mg/dL 18 13 14   Creatinine 0.61 - 1.24 mg/dL 1.45(H) 1.59(H) 1.15  Sodium 135 - 145 mmol/L 138 139 140  Potassium 3.5 - 5.1 mmol/L 3.8 3.7 4.6  Chloride 98 - 111 mmol/L 104 103 102  CO2 22 - 32 mmol/L 23 23 24   Calcium 8.9 - 10.3 mg/dL 7.4(L) 8.7(L) 9.3  Total Protein 6.5 - 8.1 g/dL 5.9(L) 7.2 7.9  Total Bilirubin 0.3 - 1.2 mg/dL 1.2 0.7 <0.2  Alkaline Phos 38 - 126 U/L 78 107 152(H)  AST 15 - 41 U/L 22 16 22   ALT 0 - 44 U/L 10 10 14    CBC Latest Ref Rng & Units 04/12/2020 04/12/2020 03/14/2020  WBC 4.0 - 10.5 K/uL 19.9(H) 15.9(H) 6.2  Hemoglobin 13.0 - 17.0 g/dL 9.9(L) 10.9(L) 15.4  Hematocrit 39 - 52 % 30.7(L) 33.5(L) 47.2  Platelets 150 - 400 K/uL 217 292 295    RADS: CLINICAL DATA:  Abdominal pain, unresponsive and nonverbal  EXAM: CT ABDOMEN AND PELVIS WITH CONTRAST  TECHNIQUE: Multidetector CT imaging of the abdomen and pelvis was performed using the standard protocol following bolus administration of intravenous contrast.  CONTRAST:  140mL OMNIPAQUE IOHEXOL 300 MG/ML  SOLN  COMPARISON:  Same day chest radiograph, CT pelvis 07/26/2019  FINDINGS: Lower chest: Evaluation of the lungs complicated by extensive respiratory motion artifact. Some patchy ground-glass opacity  could reflect atelectatic changes or less likely infection. Normal heart size. No pericardial effusion. Few coronary artery calcifications.  Hepatobiliary: Mild heterogeneity of the hepatic attenuation with a slightly nodular liver surface contour. No focal liver lesion. Moderate distention of the gallbladder. No significant gallbladder wall thickening. Pericholecystic fluid is likely redistributed from the larger volume hemoperitoneum throughout the abdomen. No visible calcified gallstones or biliary dilatation.  Pancreas: No pancreatic ductal dilatation or surrounding inflammatory changes.  Spleen: Partial compression of the spleen by a large subcapsular hematoma measuring approximately 8 x 6.2 x 11.7 cm in size with a blush of active contrast extravasation arising from the superior spleen and extending into the peritoneum with sentinel clot sign throughout the left upper quadrant.  Adrenals/Urinary Tract: 1 cm intermediate attenuation nodule in left adrenal gland. Kidneys are normally located with symmetric enhancement and excretion. No suspicious renal lesion, urolithiasis or hydronephrosis. Some mild circumferential thickening of the bladder is present. There is a left bladder ear deviating towards a left inguinal hernia which contains some fat hemoperitoneum as well.  Stomach/Bowel: Sliding-type hiatal hernia. Stomach is unremarkable. Mild wall thickening of the small bowel including the duodenum is nonspecific given the moderate volume hemoperitoneum. Some additional nonspecific colonic mural thickening  the distal transverse and splenic flexure as well.  Vascular/Lymphatic: Atherosclerotic calcifications within the abdominal aorta and branch vessels. No aneurysm or ectasia. Active contrast extravasation arising from the upper pole spleen. Few prominent, likely reactive nodes present in the inguinal region. No other conspicuous enlarged adenopathy in the abdomen or  pelvis.  Reproductive: Prostatomegaly with indentation of the bladder base. Few coarse eccentric calcifications of the prostate and seminal vesicles, often senescent change. Some mild corporal calcifications are noted as well.  Other: Moderate volume hemoperitoneum with sentinel clot in the left upper quadrant and active contrast extravasation arising from the superior pole of the spleen extending into the peritoneum proper. Bilateral fat containing inguinal hernias with small amount of fluid and partial herniation of the left anterolateral bladder into the left inguinal hernia sac. No bowel containing hernias. Focal skin thickening and subcutaneous fat stranding along the left inguinal crease with some adjacent reactive adenopathy. Additional skin thickening and subcutaneous fat stranding along the gluteal cleft, right greater than left with some mild thickening and inflammation which extends to the levator plate and intersphincteric plane. No clear abscess. A fistulization is difficult to exclude on CT imaging. Inflammatory changes extend below the margin of imaging within abscess in this vicinity seen on comparison pelvic CT. Additional enlarging subcutaneous soft tissue nodule along the medial right gluteal soft tissues superficially (2/86) measuring up to 1.8 by 0.9 cm, previously 0.8 x 0.4 cm. While this could reflect a dermal inclusion cyst, should correlate with visual inspection.  Musculoskeletal: No acute osseous abnormality or suspicious osseous lesion. Degenerative changes in the spine, hips and pelvis.  IMPRESSION: 1. Active contrast extravasation arising from the superior pole/dome of the spleen contributing both to a large subcapsular splenic hematoma and moderate volume hemoperitoneum with sentinel clot in the left upper quadrant. 2. Slightly heterogeneous and nodular liver, could correlate for features of intrinsic liver disease/cirrhosis. Consider  correlation with LFTs. 3. Skin thickening and subcutaneous fat stranding along the left inguinal crease concerning for a cellulitis with some adjacent reactive adenopathy. 4. More extensive skin thickening and subcutaneous phlegmon without organized abscess along the gluteal cleft, right greater than left with extension to the intersphincteric plane. No clear abscess though inflammatory changes extend below the margin of imaging. Furthermore, fistulization is difficult to exclude on CT imaging. Recommend correlation with visual inspection. 5. Small though slightly enlarging collection contiguous with the dermis along the more posterior right gluteal soft tissues. Could reflect a small dermal inclusion cysts though should correlate with visual inspection. 6. Bilateral fat containing inguinal hernias with small amount of fluid and partial herniation of the left anterolateral bladder into the left inguinal hernia sac. No bowel containing hernias. 7. 1 cm intermediate attenuation nodule in the left adrenal gland, incompletely characterized on this exam. Likely a benign adrenal adenoma though should consider with 1 year follow-up adrenal washout CT. This recommendation follows ACR consensus guidelines: Management of Incidental Adrenal Masses: A White Paper of the ACR Incidental Findings Committee. J Am Coll Radiol 2017;14:1038-1044. 8. Some patchy ground-glass opacity in the right lung base greater than left, possibly atelectasis versus atypical infection, difficult to delineate given extensive respiratory motion. 9. Aortic Atherosclerosis (ICD10-I70.0).  Critical Value/emergent results were called by telephone at the time of interpretation on 04/12/2020 at 2:03 am to provider [facility] , who verbally acknowledged these results.   Electronically Signed   By: Lovena Le M.D.   On: 04/12/2020 02:03   Assessment:     splenic bleed- notified  by ED of consult.  Recommended  vascular surgery consult for possible embolization.  Stable now s/p embolization by vascular surgery.  Chronic hidradenitis    Plan:     Recommend further monitoring of hidradenitis as on outpt.  Will try to avoid multiple I&D since this an exacerbate the issue long term.  Current degree of infection does not warrant urgent I&D for now.  spelnic bleed with no evidence of continued bleeding.  Recommend vaccination against encapsulated organisms.  Intra-abdominal hematoma should slowly resolve over time.  No surgical intervention needed.  Surgery will peripherally follow.  Please call with any additional questions or concerns.

## 2020-04-12 NOTE — TOC Initial Note (Signed)
Transition of Care [facility]) - Initial/Assessment Note    Patient Details  Name: CHAEL URENDA MRN: [id number] Date of Birth: 11-10-1960  [facility] CM/SW Contact:    Ova Freshwater Phone Number: [phone]  04/12/2020, 1:48 PM  Clinical Narrative:                  CSW tried to assess the patient.  He woke up after calling his name but was very lethargic and unable to participate in the conversation.  CSW contacted the patient's Deatra Canter (Sister)  [phone], for collateral information and left a voicemail. CSW spoke with patient's son Donnel Saxon [phone] for collateral information.  Mr. Clide Dales stated the patient lives with his sister and other son, and was unable to give collateral information.       Patient Goals and CMS Choice        Expected Discharge Plan and Services                                                Prior Living Arrangements/Services                       Activities of Daily Living      Permission Sought/Granted                  Emotional Assessment              Admission diagnosis:  Hemorrhagic shock ([place]) [R57.8] Hemoperitoneum [K66.1] Splenic hemorrhage [D73.5] Syncope, unspecified syncope type [R55] Patient Active Problem List   Diagnosis Date Noted  . Splenic hemorrhage 04/12/2020  . Hemoperitoneum   . Fatigue 02/22/2020  . Finger joint swelling, left 02/22/2020  . Exposure to herpes 09/27/2019  . Cyst of perianal area 07/26/2019  . Scrotal abscess 07/26/2019  . Onychomycosis 03/21/2019  . Callus of foot 02/07/2019  . Nail thickening 02/07/2019  . Encounter to establish care 02/07/2019  . Right middle ear infection 02/07/2019  . Right otitis media 02/07/2019  . Elevated blood pressure reading 02/07/2019  . Tobacco abuse 01/01/2015  . ETOH abuse 01/01/2015  . Arthritis 10/02/2014  . Hypertension 08/02/2014   PCP:  Langston Reusing, NP Pharmacy:   [facility] [place] [address] [place] [address] Phone: [phone] Fax: [phone]  [place], [place] [address] [place] [address] Phone: [phone] Fax: [place] [place] [address] Phone: [phone] Fax: [phone]     Social Determinants of Health (SDOH) Interventions    Readmission Risk Interventions No flowsheet data found.

## 2020-04-12 NOTE — ED Notes (Addendum)
Pt resting on right side. VS improved. Pt reports pain decreased slightly. Pt notes he has been seeing a scant amount of blood draining from an abscess in his perineum.   EDP updating pt contact (Gene) per request

## 2020-04-12 NOTE — ED Notes (Signed)
Still awaiting OR techs as a prerequisite to transport

## 2020-04-12 NOTE — Plan of Care (Signed)
S/p Splenic embolization on RA with ETOH withdrawal and pain issues.

## 2020-04-12 NOTE — Progress Notes (Signed)
CODE SEPSIS - PHARMACY COMMUNICATION  **Broad Spectrum Antibiotics should be administered within 1 hour of Sepsis diagnosis**  Time Code Sepsis Called/Page Received:  11/5 @ 0515  Antibiotics Ordered:  Vancomycin   Time of 1st antibiotic administration: 11/5 @ 0057  Additional action taken by pharmacy:  None   If necessary, Name of Provider/Nurse Contacted:     Lemario Chaikin D ,PharmD Clinical Pharmacist  04/12/2020  5:36 AM

## 2020-04-12 NOTE — ED Notes (Signed)
EDP notified of critical lactic result. Orders being received

## 2020-04-12 NOTE — ED Notes (Signed)
Pt prefers that Gene (listed in contacts) be updated on care

## 2020-04-12 NOTE — ED Notes (Signed)
EDP notified of BP and MAP trending down, along with HR trending up.   EDP will place order for 3rd unit of blood.   Still awaiting OR tech arrival for transport

## 2020-04-13 ENCOUNTER — Inpatient Hospital Stay: Payer: Medicaid Other | Admitting: Anesthesiology

## 2020-04-13 ENCOUNTER — Inpatient Hospital Stay: Payer: Medicaid Other

## 2020-04-13 ENCOUNTER — Inpatient Hospital Stay: Admit: 2020-04-13 | Payer: Medicaid Other

## 2020-04-13 ENCOUNTER — Encounter
Admission: EM | Disposition: A | Discharge status: Home Health | Payer: Self-pay | Source: Home / Self Care | Attending: Internal Medicine

## 2020-04-13 ENCOUNTER — Encounter: Payer: Self-pay | Admitting: Family Medicine

## 2020-04-13 DIAGNOSIS — J9601 Acute respiratory failure with hypoxia: Secondary | ICD-10-CM

## 2020-04-13 HISTORY — PX: LAPAROTOMY: SHX154

## 2020-04-13 LAB — PREPARE RBC (CROSSMATCH)

## 2020-04-13 LAB — CBC
HCT: 24.1 % — ABNORMAL LOW (ref 39.0–52.0)
HCT: 39.4 % (ref 39.0–52.0)
Hemoglobin: 7.8 g/dL — ABNORMAL LOW (ref 13.0–17.0)
Hemoglobin: 13.1 g/dL (ref 13.0–17.0)
MCH: 29.9 pg (ref 26.0–34.0)
MCH: 30.7 pg (ref 26.0–34.0)
MCHC: 32.4 g/dL (ref 30.0–36.0)
MCHC: 33.2 g/dL (ref 30.0–36.0)
MCV: 92.3 fL (ref 80.0–100.0)
MCV: 92.3 fL (ref 80.0–100.0)
Platelets: 211 10*3/uL (ref 150–400)
Platelets: 159 10*3/uL (ref 150–400)
RBC: 2.61 MIL/uL — ABNORMAL LOW (ref 4.22–5.81)
RBC: 4.27 MIL/uL (ref 4.22–5.81)
RDW: 16 % — ABNORMAL HIGH (ref 11.5–15.5)
RDW: 16.5 % — ABNORMAL HIGH (ref 11.5–15.5)
WBC: 18.9 10*3/uL — ABNORMAL HIGH (ref 4.0–10.5)
WBC: 28.1 10*3/uL — ABNORMAL HIGH (ref 4.0–10.5)
nRBC: 0.2 % (ref 0.0–0.2)
nRBC: 0.3 % — ABNORMAL HIGH (ref 0.0–0.2)

## 2020-04-13 LAB — BLOOD GAS, ARTERIAL
Acid-base deficit: 4.3 mmol/L — ABNORMAL HIGH (ref 0.0–2.0)
Bicarbonate: 21.6 mmol/L (ref 20.0–28.0)
FIO2: 0.4
MECHVT: 450 mL
O2 Saturation: 91.6 %
PEEP: 5 cmH2O
Patient temperature: 37
RATE: 16 resp/min
pCO2 arterial: 42 mmHg (ref 32.0–48.0)
pH, Arterial: 7.32 — ABNORMAL LOW (ref 7.350–7.450)
pO2, Arterial: 68 mmHg — ABNORMAL LOW (ref 83.0–108.0)

## 2020-04-13 LAB — COMPREHENSIVE METABOLIC PANEL
ALT: 10 U/L (ref 0–44)
ALT: 13 U/L (ref 0–44)
AST: 18 U/L (ref 15–41)
AST: 35 U/L (ref 15–41)
Albumin: 2.6 g/dL — ABNORMAL LOW (ref 3.5–5.0)
Albumin: 2.5 g/dL — ABNORMAL LOW (ref 3.5–5.0)
Alkaline Phosphatase: 76 U/L (ref 38–126)
Alkaline Phosphatase: 74 U/L (ref 38–126)
Anion gap: 8 (ref 5–15)
Anion gap: 10 (ref 5–15)
BUN: 20 mg/dL (ref 6–20)
BUN: 13 mg/dL (ref 6–20)
CO2: 24 mmol/L (ref 22–32)
CO2: 20 mmol/L — ABNORMAL LOW (ref 22–32)
Calcium: 7.9 mg/dL — ABNORMAL LOW (ref 8.9–10.3)
Calcium: 7.3 mg/dL — ABNORMAL LOW (ref 8.9–10.3)
Chloride: 106 mmol/L (ref 98–111)
Chloride: 109 mmol/L (ref 98–111)
Creatinine, Ser: 1.35 mg/dL — ABNORMAL HIGH (ref 0.61–1.24)
Creatinine, Ser: 0.84 mg/dL (ref 0.61–1.24)
GFR, Estimated: >60 mL/min (ref >60)
GFR, Estimated: >60 mL/min (ref >60)
Glucose, Bld: 121 mg/dL — ABNORMAL HIGH (ref 70–99)
Glucose, Bld: 111 mg/dL — ABNORMAL HIGH (ref 70–99)
Potassium: 4.5 mmol/L (ref 3.5–5.1)
Potassium: 4 mmol/L (ref 3.5–5.1)
Sodium: 138 mmol/L (ref 135–145)
Sodium: 139 mmol/L (ref 135–145)
Total Bilirubin: 0.6 mg/dL (ref 0.3–1.2)
Total Bilirubin: 1.3 mg/dL — ABNORMAL HIGH (ref 0.3–1.2)
Total Protein: 6 g/dL — ABNORMAL LOW (ref 6.5–8.1)
Total Protein: 5.8 g/dL — ABNORMAL LOW (ref 6.5–8.1)

## 2020-04-13 LAB — TRIGLYCERIDES: Triglycerides: 85 mg/dL (ref <150)

## 2020-04-13 LAB — LACTIC ACID, PLASMA
Lactic Acid, Venous: 2.5 mmol/L (ref 0.5–1.9)
Lactic Acid, Venous: 0.9 mmol/L (ref 0.5–1.9)

## 2020-04-13 LAB — HEMOGLOBIN A1C
Hgb A1c MFr Bld: 5.7 % — ABNORMAL HIGH (ref 4.8–5.6)
Mean Plasma Glucose: 116.89 mg/dL

## 2020-04-13 LAB — PROCALCITONIN: Procalcitonin: 0.61 ng/mL

## 2020-04-13 LAB — GLUCOSE, CAPILLARY
Glucose-Capillary: 100 mg/dL — ABNORMAL HIGH (ref 70–99)
Glucose-Capillary: 104 mg/dL — ABNORMAL HIGH (ref 70–99)
Glucose-Capillary: 100 mg/dL — ABNORMAL HIGH (ref 70–99)
Glucose-Capillary: 105 mg/dL — ABNORMAL HIGH (ref 70–99)
Glucose-Capillary: 128 mg/dL — ABNORMAL HIGH (ref 70–99)

## 2020-04-13 LAB — PROTIME-INR
INR: 1.1 (ref 0.8–1.2)
Prothrombin Time: 13.4 seconds (ref 11.4–15.2)

## 2020-04-13 LAB — HEPATITIS B SURFACE ANTIBODY, QUANTITATIVE: Hep B S AB Quant (Post): <3.1 m[IU]/mL — ABNORMAL LOW (ref >9.9)

## 2020-04-13 LAB — MAGNESIUM: Magnesium: 2 mg/dL (ref 1.7–2.4)

## 2020-04-13 LAB — CORTISOL-AM, BLOOD: Cortisol - AM: 23.6 ug/dL — ABNORMAL HIGH (ref 6.7–22.6)

## 2020-04-13 LAB — HEMOGLOBIN AND HEMATOCRIT, BLOOD
HCT: 22 % — ABNORMAL LOW (ref 39.0–52.0)
Hemoglobin: 7.3 g/dL — ABNORMAL LOW (ref 13.0–17.0)

## 2020-04-13 LAB — APTT: aPTT: 34 seconds (ref 24–36)

## 2020-04-13 LAB — PHOSPHORUS: Phosphorus: 2.7 mg/dL (ref 2.5–4.6)

## 2020-04-13 SURGERY — LAPAROTOMY, EXPLORATORY
Anesthesia: General

## 2020-04-13 MED ORDER — DOCUSATE SODIUM 50 MG/5ML PO LIQD
100.0000 mg | Freq: Two times a day (BID) | ORAL | Status: DC
  Filled 2020-04-13: qty 10

## 2020-04-13 MED ORDER — PANTOPRAZOLE SODIUM 40 MG IV SOLR: 40.0000 mg | INTRAVENOUS | Status: DC

## 2020-04-13 MED ORDER — FENTANYL CITRATE (PF) 100 MCG/2ML IJ SOLN
50.0000 ug | INTRAMUSCULAR | Status: DC | PRN
  Administered 2020-04-13 (×2): 100 ug via INTRAVENOUS
  Filled 2020-04-13 (×2): qty 2

## 2020-04-13 MED ORDER — LIDOCAINE HCL (CARDIAC) PF 100 MG/5ML IV SOSY
PREFILLED_SYRINGE | INTRAVENOUS | Status: DC | PRN
  Administered 2020-04-13: 100 mg via INTRAVENOUS

## 2020-04-13 MED ORDER — CEFAZOLIN SODIUM-DEXTROSE 2-3 GM-%(50ML) IV SOLR
INTRAVENOUS | Status: DC | PRN
  Administered 2020-04-13: 2 g via INTRAVENOUS

## 2020-04-13 MED ORDER — MIDAZOLAM HCL 2 MG/2ML IJ SOLN: 2.0000 mg | INTRAMUSCULAR | Status: DC | PRN

## 2020-04-13 MED ORDER — OXYCODONE HCL 5 MG PO TABS: 5.0000 mg | ORAL_TABLET | Freq: Once | ORAL | Status: DC | PRN

## 2020-04-13 MED ORDER — POLYETHYLENE GLYCOL 3350 17 G PO PACK
17.0000 g | PACK | Freq: Every day | ORAL | Status: DC
  Filled 2020-04-13: qty 1

## 2020-04-13 MED ORDER — CHLORHEXIDINE GLUCONATE 0.12% ORAL RINSE (MEDLINE KIT)
15.0000 mL | Freq: Two times a day (BID) | OROMUCOSAL | Status: DC
  Administered 2020-04-13 – 2020-04-17 (×4): 15 mL via OROMUCOSAL

## 2020-04-13 MED ORDER — FENTANYL CITRATE (PF) 100 MCG/2ML IJ SOLN
INTRAMUSCULAR | Status: AC
  Filled 2020-04-13: qty 2

## 2020-04-13 MED ORDER — FAMOTIDINE IN NACL 20-0.9 MG/50ML-% IV SOLN
20.0000 mg | Freq: Two times a day (BID) | INTRAVENOUS | Status: DC
  Administered 2020-04-13 – 2020-04-15 (×4): 20 mg via INTRAVENOUS
  Filled 2020-04-13 (×4): qty 50

## 2020-04-13 MED ORDER — FENTANYL CITRATE (PF) 100 MCG/2ML IJ SOLN
50.0000 ug | Freq: Once | INTRAMUSCULAR | Status: AC
  Administered 2020-04-13: 50 ug via INTRAVENOUS

## 2020-04-13 MED ORDER — PROPOFOL 1000 MG/100ML IV EMUL
5.0000 ug/kg/min | INTRAVENOUS | Status: DC
  Administered 2020-04-13: 60 ug/kg/min via INTRAVENOUS
  Administered 2020-04-14: 40 ug/kg/min via INTRAVENOUS
  Administered 2020-04-14: 60 ug/kg/min via INTRAVENOUS
  Filled 2020-04-13 (×4): qty 100

## 2020-04-13 MED ORDER — SODIUM CHLORIDE 0.9% IV SOLUTION: Freq: Once | INTRAVENOUS | Status: DC

## 2020-04-13 MED ORDER — MIDAZOLAM HCL 5 MG/5ML IJ SOLN
INTRAMUSCULAR | Status: DC | PRN
  Administered 2020-04-13: 2 mg via INTRAVENOUS

## 2020-04-13 MED ORDER — OXYCODONE HCL 5 MG/5ML PO SOLN: 5.0000 mg | Freq: Once | ORAL | Status: DC | PRN

## 2020-04-13 MED ORDER — ORAL CARE MOUTH RINSE
15.0000 mL | OROMUCOSAL | Status: DC
  Administered 2020-04-13 – 2020-04-17 (×15): 15 mL via OROMUCOSAL

## 2020-04-13 MED ORDER — SODIUM CHLORIDE 0.9 % IV SOLN
100.0000 mg | Freq: Two times a day (BID) | INTRAVENOUS | Status: DC
  Administered 2020-04-13 – 2020-04-17 (×9): 100 mg via INTRAVENOUS
  Filled 2020-04-13 (×12): qty 100

## 2020-04-13 MED ORDER — PROPOFOL 500 MG/50ML IV EMUL
INTRAVENOUS | Status: AC
  Filled 2020-04-13: qty 50

## 2020-04-13 MED ORDER — MIDAZOLAM HCL 2 MG/2ML IJ SOLN
INTRAMUSCULAR | Status: AC
  Filled 2020-04-13: qty 2

## 2020-04-13 MED ORDER — BUPIVACAINE-EPINEPHRINE (PF) 0.25% -1:200000 IJ SOLN
INTRAMUSCULAR | Status: AC
  Filled 2020-04-13: qty 30

## 2020-04-13 MED ORDER — PANTOPRAZOLE SODIUM 40 MG IV SOLR
40.0000 mg | INTRAVENOUS | Status: DC
  Administered 2020-04-13 – 2020-04-16 (×4): 40 mg via INTRAVENOUS
  Filled 2020-04-13 (×4): qty 40

## 2020-04-13 MED ORDER — FENTANYL BOLUS VIA INFUSION
50.0000 ug | INTRAVENOUS | Status: DC | PRN
  Administered 2020-04-14: 50 ug via INTRAVENOUS
  Filled 2020-04-13: qty 50

## 2020-04-13 MED ORDER — IOHEXOL 350 MG/ML SOLN
100.0000 mL | Freq: Once | INTRAVENOUS | Status: AC | PRN
  Administered 2020-04-13: 100 mL via INTRAVENOUS

## 2020-04-13 MED ORDER — POLYETHYLENE GLYCOL 3350 17 G PO PACK
17.0000 g | PACK | Freq: Every day | ORAL | Status: DC
  Administered 2020-04-15: 17 g via NASOGASTRIC
  Filled 2020-04-13 (×2): qty 1

## 2020-04-13 MED ORDER — DOCUSATE SODIUM 50 MG/5ML PO LIQD
100.0000 mg | Freq: Two times a day (BID) | ORAL | Status: DC
  Administered 2020-04-14 – 2020-04-15 (×3): 100 mg
  Filled 2020-04-13 (×4): qty 10

## 2020-04-13 MED ORDER — SODIUM CHLORIDE 0.9 % IV SOLN
INTRAVENOUS | Status: DC | PRN
  Administered 2020-04-13: 70 mL

## 2020-04-13 MED ORDER — FENTANYL CITRATE (PF) 100 MCG/2ML IJ SOLN: 25.0000 ug | INTRAMUSCULAR | Status: DC | PRN

## 2020-04-13 MED ORDER — VISTASEAL 10 ML SINGLE DOSE KIT
PACK | CUTANEOUS | Status: AC
  Filled 2020-04-13: qty 10

## 2020-04-13 MED ORDER — SODIUM CHLORIDE 0.9 % IV SOLN: INTRAVENOUS | Status: DC | PRN

## 2020-04-13 MED ORDER — PROPOFOL 10 MG/ML IV BOLUS
INTRAVENOUS | Status: DC | PRN
  Administered 2020-04-13 (×2): 50 mg via INTRAVENOUS
  Administered 2020-04-13: 100 mg via INTRAVENOUS

## 2020-04-13 MED ORDER — BUPIVACAINE-EPINEPHRINE (PF) 0.25% -1:200000 IJ SOLN
INTRAMUSCULAR | Status: DC | PRN
  Administered 2020-04-13: 30 mL via PERINEURAL

## 2020-04-13 MED ORDER — MIDAZOLAM HCL 2 MG/2ML IJ SOLN: 1.0000 mg | INTRAMUSCULAR | Status: DC | PRN

## 2020-04-13 MED ORDER — NICOTINE 21 MG/24HR TD PT24
21.0000 mg | MEDICATED_PATCH | Freq: Every day | TRANSDERMAL | Status: DC
  Administered 2020-04-13 – 2020-04-18 (×6): 21 mg via TRANSDERMAL
  Filled 2020-04-13 (×6): qty 1

## 2020-04-13 MED ORDER — VISTASEAL 10 ML SINGLE DOSE KIT
PACK | CUTANEOUS | Status: DC | PRN
  Administered 2020-04-13: 10 mL via TOPICAL

## 2020-04-13 MED ORDER — FENTANYL CITRATE (PF) 100 MCG/2ML IJ SOLN
INTRAMUSCULAR | Status: DC | PRN
  Administered 2020-04-13 (×2): 50 ug via INTRAVENOUS

## 2020-04-13 MED ORDER — BUPIVACAINE LIPOSOME 1.3 % IJ SUSP
INTRAMUSCULAR | Status: AC
  Filled 2020-04-13: qty 20

## 2020-04-13 MED ORDER — ONDANSETRON HCL 4 MG/2ML IJ SOLN
INTRAMUSCULAR | Status: DC | PRN
  Administered 2020-04-13: 4 mg via INTRAVENOUS

## 2020-04-13 MED ORDER — DEXAMETHASONE SODIUM PHOSPHATE 10 MG/ML IJ SOLN
INTRAMUSCULAR | Status: DC | PRN
  Administered 2020-04-13: 10 mg via INTRAVENOUS

## 2020-04-13 MED ORDER — ROCURONIUM BROMIDE 100 MG/10ML IV SOLN
INTRAVENOUS | Status: DC | PRN
  Administered 2020-04-13 (×2): 50 mg via INTRAVENOUS

## 2020-04-13 MED ORDER — PROPOFOL 1000 MG/100ML IV EMUL
INTRAVENOUS | Status: AC
  Filled 2020-04-13: qty 100

## 2020-04-13 MED ORDER — FENTANYL 2500MCG IN NS 250ML (10MCG/ML) PREMIX INFUSION
50.0000 ug/h | INTRAVENOUS | Status: DC
  Administered 2020-04-13: 50 ug/h via INTRAVENOUS
  Filled 2020-04-13: qty 250

## 2020-04-13 MED ORDER — SUCCINYLCHOLINE CHLORIDE 20 MG/ML IJ SOLN
INTRAMUSCULAR | Status: DC | PRN
  Administered 2020-04-13: 100 mg via INTRAVENOUS

## 2020-04-13 SURGICAL SUPPLY — 54 items
APL PRP STRL LF DISP 70% ISPRP (MISCELLANEOUS) ×1
APPLIER CLIP 11 MED OPEN (CLIP)
APPLIER CLIP 13 LRG OPEN (CLIP)
APR CLP LRG 13 20 CLIP (CLIP)
APR CLP MED 11 20 MLT OPN (CLIP)
BARRIER ADH SEPRAFILM 3INX5IN (MISCELLANEOUS) IMPLANT
BLADE CLIPPER SURG (BLADE) ×2 IMPLANT
BLADE SURG SZ10 CARB STEEL (BLADE) ×2 IMPLANT
BRR ADH 5X3 SEPRAFILM 2 SHT (MISCELLANEOUS)
CANISTER SUCT 3000ML PPV (MISCELLANEOUS) ×2 IMPLANT
CHLORAPREP W/TINT 26 (MISCELLANEOUS) ×2 IMPLANT
CLIP APPLIE 11 MED OPEN (CLIP) IMPLANT
CLIP APPLIE 13 LRG OPEN (CLIP) IMPLANT
COVER BACK TABLE REUSABLE LG (DRAPES) ×2 IMPLANT
COVER WAND RF STERILE (DRAPES) ×2 IMPLANT
DRAPE LAPAROTOMY 100X77 ABD (DRAPES) ×2 IMPLANT
DRESSING SURGICEL FIBRLLR 1X2 (HEMOSTASIS) ×1 IMPLANT
DRSG OPSITE POSTOP 4X10 (GAUZE/BANDAGES/DRESSINGS) IMPLANT
DRSG OPSITE POSTOP 4X12 (GAUZE/BANDAGES/DRESSINGS) ×2 IMPLANT
DRSG SURGICEL FIBRILLAR 1X2 (HEMOSTASIS) ×2
ELECT BLADE 6.5 EXT (BLADE) ×2 IMPLANT
ELECT REM PT RETURN 9FT ADLT (ELECTROSURGICAL) ×2
ELECTRODE REM PT RTRN 9FT ADLT (ELECTROSURGICAL) ×1 IMPLANT
GLOVE BIO SURGEON STRL SZ7 (GLOVE) ×2 IMPLANT
GOWN STRL REUS W/ TWL LRG LVL3 (GOWN DISPOSABLE) ×2 IMPLANT
GOWN STRL REUS W/TWL LRG LVL3 (GOWN DISPOSABLE) ×4
HANDLE SUCTION POOLE (INSTRUMENTS) ×1 IMPLANT
HANDLE YANKAUER SUCT BULB TIP (MISCELLANEOUS) ×2 IMPLANT
HEMOSTAT SURGICEL 2X14 (HEMOSTASIS) ×2 IMPLANT
HEMOSTAT SURGICEL 2X3 (HEMOSTASIS) ×2 IMPLANT
LIGASURE IMPACT 36 18CM CVD LR (INSTRUMENTS) ×2 IMPLANT
MANIFOLD NEPTUNE II (INSTRUMENTS) IMPLANT
NEEDLE HYPO 22GX1.5 SAFETY (NEEDLE) ×4 IMPLANT
PACK BASIN MAJOR ARMC (MISCELLANEOUS) ×2 IMPLANT
RELOAD PROXIMATE 75MM BLUE (ENDOMECHANICALS) IMPLANT
SPONGE DRAIN TRACH 4X4 STRL 2S (GAUZE/BANDAGES/DRESSINGS) ×2 IMPLANT
SPONGE LAP 18X18 RF (DISPOSABLE) ×10 IMPLANT
SPONGE LAP 18X36 RFD (DISPOSABLE) ×2 IMPLANT
STAPLER PROXIMATE 75MM BLUE (STAPLE) IMPLANT
STAPLER SKIN PROX 35W (STAPLE) ×2 IMPLANT
SUCTION POOLE HANDLE (INSTRUMENTS) ×2
SUT PDS AB 0 CT1 27 (SUTURE) ×6 IMPLANT
SUT SILK 2 0 (SUTURE) ×2
SUT SILK 2 0 SH CR/8 (SUTURE) ×2 IMPLANT
SUT SILK 2 0SH CR/8 30 (SUTURE) ×2 IMPLANT
SUT SILK 2-0 18XBRD TIE 12 (SUTURE) ×1 IMPLANT
SUT VIC AB 0 CT1 36 (SUTURE) ×4 IMPLANT
SUT VIC AB 2-0 SH 27 (SUTURE) ×4
SUT VIC AB 2-0 SH 27XBRD (SUTURE) ×2 IMPLANT
SUT VIC AB 3-0 SH 27 (SUTURE) ×2
SUT VIC AB 3-0 SH 27X BRD (SUTURE) ×1 IMPLANT
SYR 30ML LL (SYRINGE) ×4 IMPLANT
SYR 3ML LL SCALE MARK (SYRINGE) ×2 IMPLANT
TRAY FOLEY MTR SLVR 16FR STAT (SET/KITS/TRAYS/PACK) ×2 IMPLANT

## 2020-04-13 NOTE — Progress Notes (Signed)
Advance endotracheal tube 4 cm ,

## 2020-04-13 NOTE — Progress Notes (Signed)
As suspected Ct scan confirmed my  Clinical suspicion, worsening hemoperitoneum and some persistent blush. He needs emergent laparotomy and control of bleeding Now. OR team and anesthesia team alerted.

## 2020-04-13 NOTE — Progress Notes (Addendum)
Pt returning from OR intubated.  PCCM managing vent.  Will sign off at this time.  Please call back when TRH needed again.  Discussed with PCCM and Dr. Dahlia Byes.

## 2020-04-13 NOTE — Consult Note (Addendum)
NAME:  Caleb Brooks, MRN:  [id number], DOB:  [birth year], LOS: 1 ADMISSION DATE:  04/12/2020, CONSULTATION DATE:  04/13/20 REFERRING MD:  DR Diego Pabon and Dr Melven Sartorius, CHIEF COMPLAINT:  Post splenectomy resp failure   Brief History   Post splenectomy.  Respiratory failure with intraperitoneal bleed  History of present illness -to the hospitalist on 04/12/2020  Caleb Brooks  is a 59 y.o. African-American male with a known history of hypertension, hidradenitis suppurativa complicated by recurrent boils and keloid formation anxiety and osteoarthritis as well as EtOH and tobacco abuse, presented to the emergency room with acute onset of syncope twice with associated lightheadedness and dizziness.  The patient was noted to be hypotensive with a BP of 54/30 by EMS.  He has been having purulent drainage from his perineal boils for a few days with associated swelling and severe pain that has been constant.  He admits to left upper quadrant abdominal pain after having a fall on his side.  No nausea or vomiting or melena or bright red bleeding per rectum.  No dyspnea or cough or wheezing or hemoptysis.  No other bleeding diathesis.  No chest pain or palpitations.  Upon presentation to the emergency room, blood pressure was initially 130/82 with a heart rate of 81 and respiratory to 27 and later respiratory it was up to 43 and blood pressure dropped to 87/64 with respiratory rate of 22-30.  BP later on improved with hydration to 122/92 with normal map.  He was afebrile and has been maintaining pulse ox symmetry of 97 to 99% on room air.  Labs revealed a blood glucose of 197 and a BUN of 13 with a creatinine 1.59 compared to 14 and 1.15 on 03/14/2020.  Albumin was 3 and lactic acid 5.4 with procalcitonin 0.28.  CBC showed leukocytosis of 15.9 and anemia with hemoglobin of 10.9 hematocrit of 33.5 compared to 15.4 and 47.2 a month ago.  Platelets were 292.  Alcohol levels less than 10.  Portable chest x-ray  showed interstitial prominence predominantly in the lower lobes that may represent atypical infection.  Abdominal and pelvic CT scan showed the following: 1. Active contrast extravasation arising from the superior pole/dome of the spleen contributing both to a large subcapsular splenic hematoma and moderate volume hemoperitoneum with sentinel clot in the left upper quadrant. 2. Slightly heterogeneous and nodular liver, could correlate for features of intrinsic liver disease/cirrhosis. Consider correlation with LFTs. 3. Skin thickening and subcutaneous fat stranding along the left inguinal crease concerning for a cellulitis with some adjacent reactive adenopathy. 4. More extensive skin thickening and subcutaneous phlegmon without organized abscess along the gluteal cleft, right greater than left with extension to the intersphincteric plane. No clear abscess though inflammatory changes extend below the margin of imaging. Furthermore, fistulization is difficult to exclude on CT imaging. Recommend correlation with visual inspection. 5. Small though slightly enlarging collection contiguous with the dermis along the more posterior right gluteal soft tissues. Could reflect a small dermal inclusion cysts though should correlate with visual inspection. 6. Bilateral fat containing inguinal hernias with small amount of fluid and partial herniation of the left anterolateral bladder into the left inguinal hernia sac. No bowel containing hernias. 7. 1 cm intermediate attenuation nodule in the left adrenal gland, incompletely characterized on this exam. Likely a benign adrenal adenoma though should consider with 1 year follow-up adrenal washout CT. This recommendation follows ACR consensus guidelines: Management of Incidental Adrenal Masses: A White Paper of the ACR  Incidental Findings Committee. J Am Coll Radiol 2017;14:1038-1044. 8. Some patchy ground-glass opacity in the right lung base  greater than left, possibly atelectasis versus atypical infection, difficult to delineate given extensive respiratory motion. 9. Aortic Atherosclerosis  The patient was given IV vancomycin and Zosyn, 2 L bolus of IV lactated Ringer and 3 units of packed red blood cells as well as 1 g of IV tranexamic acid.  Dr. Lysle Pearl was notified about the patient and recommended vascular surgery consult.  Dr. Lucky Cowboy was notified but the patient and is taking him to the OR for embolectomy.  I was asked by the ER physician to admit the patient per Dr. Lysle Pearl and Dr. Bunnie Domino recommendation.  The patient will be admitted to progressive unit bed for further evaluation and management.  Urine tox at admission 04/12/2020: Positive for cocaine and marijuana    CCM consultation history 04/13/2020   59 year old smoker with urine tox positive for cocaine, marijuana/he is status post fall.  Had left upper quadrant pain.  Had syncope.  Noted to have splenic bleed.  Is status post coil embolization of the splenic artery 04/12/2020 and status post 3 unit PRBC.  However on 04/13/2020 CT scan of the chest showed active contrast extravasation around the dome of the spleen.  Seen by surgery Dr. 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  Return to the intensive care unit on ventilator and CCM assuming primary care.  Upon return to the ICU still well sedated with effects of anesthesia but hypotensive despite propofol infusion.  On 40% FiO2 on the ventilator   Past Medical History     has a past medical history of Anxiety, Arthritis, and Hypertension.   reports that he has been smoking cigarettes. He has a 15.00 pack-year smoking history. He has never used smokeless tobacco.  Past Surgical History:   Procedure Laterality Date  . CYST REMOVAL TRUNK     Groin area around 2000  . EMBOLIZATION N/A 04/12/2020   Procedure: EMBOLIZATION, spleen;  Surgeon: Algernon Huxley, MD;  Location: [facility];  Service: Cardiovascular;  Laterality: N/A;  . INCISION AND DRAINAGE ABSCESS N/A 07/26/2019   Procedure: INCISION AND DRAINAGE ABSCESS;  Surgeon: Hollice Espy, MD;  Location: [facility];  Service: Urology;  Laterality: N/A;  . INCISION AND DRAINAGE ABSCESS N/A 07/26/2019   Procedure: INCISION AND DRAINAGE ABSCESS;  Surgeon: Benjamine Sprague, DO;  Location: [facility];  Service: General;  Laterality: N/A;    Allergies  Allergen Reactions  . Ibuprofen Other (See Comments)    Stomach upset    Immunization History  Administered Date(s) Administered  . PFIZER SARS-COV-2 Vaccination 12/08/2019    Family History  Problem Relation Age of Onset  . Diabetes Father   . Hypertension Father   . Congestive Heart Failure Father   . Hypertension Sister   . CVA Sister   . Bipolar disorder Brother   . Schizophrenia Brother      Current Facility-Administered Medications:  .  0.9 %  sodium chloride infusion (Manually program via Guardrails IV Fluids), , Intravenous, Once, Pabon, Diego F, MD .  0.9 %  sodium chloride infusion (Manually program via Guardrails IV Fluids), , Intravenous, Once, Pabon, [place] F, MD .  0.9 %  sodium chloride infusion, , Intravenous, Continuous, Florene Glen,  A Clint Lipps., MD, Last Rate: 75 mL/hr at 04/13/20 1753, 75 mL/hr at 04/13/20 1753 .  cefTRIAXone (ROCEPHIN) 2 g in sodium chloride 0.9 % 100 mL IVPB, 2 g, Intravenous, Q24H, Elodia Florence., MD, Stopped at 04/12/20 2210 .  Chlorhexidine Gluconate Cloth 2 % PADS 6 each, 6 each, Topical, Daily, Elodia Florence., MD, 6 each at 04/12/20 [phone]  doxycycline (VIBRAMYCIN) 100 mg in sodium chloride 0.9 % 250 mL IVPB, 100 mg, Intravenous, Q12H, Pabon, Diego F, MD, Last Rate: 125 mL/hr at 04/13/20 1300, Rate Verify at  04/13/20 1300 .  gabapentin (NEURONTIN) capsule 300 mg, 300 mg, Oral, QHS, Powell, A Clint Lipps., MD .  HYDROmorphone (DILAUDID) injection 0.5 mg, 0.5 mg, Intravenous, Q3H PRN, 0.5 mg at 04/12/20 1318 **OR** HYDROmorphone (DILAUDID) injection 1 mg, 1 mg, Intravenous, Q3H PRN, Elodia Florence., MD, 1 mg at 04/12/20 2024 .  insulin aspart (novoLOG) injection 0-9 Units, 0-9 Units, Subcutaneous, Q4H, Elodia Florence., MD, 1 Units at 04/12/20 2339 .  LORazepam (ATIVAN) injection 0-4 mg, 0-4 mg, Intravenous, Q6H, 2 mg at 04/13/20 0317 **FOLLOWED BY** [START ON 04/14/2020] LORazepam (ATIVAN) injection 0-4 mg, 0-4 mg, Intravenous, Q12H, Florene Glen, A Clint Lipps., MD .  LORazepam (ATIVAN) tablet 1-4 mg, 1-4 mg, Oral, Q1H PRN **OR** LORazepam (ATIVAN) injection 1-4 mg, 1-4 mg, Intravenous, Q1H PRN, Elodia Florence., MD, 2 mg at 04/12/20 2025 .  magnesium hydroxide (MILK OF MAGNESIA) suspension 30 mL, 30 mL, Oral, Daily PRN, Elodia Florence., MD .  metroNIDAZOLE (FLAGYL) IVPB 500 mg, 500 mg, Intravenous, Q8H, Elodia Florence., MD, Last Rate: 100 mL/hr at 04/13/20 1443, 500 mg at 04/13/20 1459 .  nicotine (NICODERM CQ - dosed in mg/24 hours) patch 21 mg, 21 mg, Transdermal, Daily, Pabon, Diego F, MD, 21 mg at 04/13/20 1132 .  [DISCONTINUED] ondansetron (ZOFRAN) tablet 4 mg, 4 mg, Oral, Q6H PRN **OR** ondansetron (ZOFRAN) injection 4 mg, 4 mg, Intravenous, Q6H PRN, Elodia Florence., MD, 4 mg at 04/12/20 1325 .  tamsulosin (FLOMAX) capsule 0.4 mg, 0.4 mg, Oral, QPC supper, Elodia Florence., MD   Significant Hospital Events   *04/12/2020 - admit  11/6 - plap  Consults:  04/12/2020 - vascular an surgeyr 04/13/20 - CCM  Procedures:  x  Significant Diagnostic Tests:  x  Micro Data:  x  Antimicrobials:   Anti-infectives (From admission, onward)   Start     Dose/Rate Route Frequency Ordered Stop   04/13/20 1200  doxycycline (VIBRAMYCIN) 100 mg in sodium chloride 0.9  % 250 mL IVPB        100 mg 125 mL/hr over 120 Minutes Intravenous Every 12 hours 04/13/20 1112     04/12/20 2200  cefTRIAXone (ROCEPHIN) 2 g in sodium chloride 0.9 % 100 mL IVPB        2 g 200 mL/hr over 30 Minutes Intravenous Every 24 hours 04/12/20 1530     04/12/20 2200  metroNIDAZOLE (FLAGYL) IVPB 500 mg        500 mg 100 mL/hr over 60 Minutes Intravenous Every 8 hours 04/12/20 1530     04/12/20 1300  vancomycin (VANCOREADY) IVPB 750 mg/150 mL  Status:  Discontinued        750 mg 150 mL/hr over 60 Minutes Intravenous Every 12 hours 04/12/20 0544 04/13/20 1112   04/12/20 0600  meropenem (MERREM) 1 g in sodium chloride 0.9 % 100 mL IVPB  Status:  Discontinued  1 g 200 mL/hr over 30 Minutes Intravenous Every 8 hours 04/12/20 0526 04/12/20 1530   04/12/20 0545  vancomycin (VANCOREADY) IVPB 750 mg/150 mL        750 mg 150 mL/hr over 60 Minutes Intravenous  Once 04/12/20 0537 04/12/20 0949   04/12/20 0530  vancomycin (VANCOCIN) IVPB 1000 mg/200 mL premix  Status:  Discontinued        1,000 mg 200 mL/hr over 60 Minutes Intravenous  Once 04/12/20 0526 04/12/20 0650   04/12/20 0524  ceFAZolin (ANCEF) IVPB 2g/100 mL premix        2 g 200 mL/hr over 30 Minutes Intravenous 30 min pre-op 04/12/20 0524 04/12/20 0554   04/12/20 0030  piperacillin-tazobactam (ZOSYN) IVPB 3.375 g        3.375 g 100 mL/hr over 30 Minutes Intravenous  Once 04/12/20 0021 04/12/20 0213   04/12/20 0030  vancomycin (VANCOCIN) IVPB 1000 mg/200 mL premix        1,000 mg 200 mL/hr over 60 Minutes Intravenous  Once 04/12/20 0021 04/12/20 0236        Interim history/subjective:  11/6 - seen in ICU  Objective   Blood pressure (!) 158/97, pulse 100, temperature 99.1 F (37.3 C), temperature source Oral, resp. rate (!) 37, height 5\' 11"  (1.803 m), weight 76.4 kg, SpO2 98 %.    Vent Mode: PRVC FiO2 (%):  [40 %] 40 % Set Rate:  [16 bmp] 16 bmp Vt Set:  [450 mL] 450 mL PEEP:  [5 cmH20] 5 cmH20 Plateau  Pressure:  [15 cmH20] 15 cmH20   Intake/Output Summary (Last 24 hours) at 04/13/2020 1753 Last data filed at 04/13/2020 1712 Gross per 24 hour  Intake 7991.43 ml  Output 2000 ml  Net 5991.43 ml   Filed Weights   04/12/20 0013 04/12/20 0620  Weight: 74.8 kg 76.4 kg    Examination: General: RASS sedation score -5 on propofol. HENT: Endotracheal tube present Lungs: Synchronous with the ventilator clear to auscultation bilaterally Cardiovascular: Normal heart sounds Abdomen: Surgical incision present Extremities: No cyanosis no clubbing no edema Neuro: RASS sedation score -5 GU: Appears intact    LABS    PULMONARY Recent Labs  Lab 04/13/20 1749  PHART 7.32*  PCO2ART 42  PO2ART 68*  HCO3 21.6  O2SAT 91.6    CBC Recent Labs  Lab 04/12/20 0016 04/12/20 0016 04/12/20 0930 04/12/20 0930 04/12/20 1612 04/13/20 0425 04/13/20 1212  HGB 10.9*   < > 9.9*   < > 9.1* 7.8* 7.3*  HCT 33.5*   < > 30.7*   < > 27.7* 24.1* 22.0*  WBC 15.9*  --  19.9*  --   --  18.9*  --   PLT 292  --  217  --   --  211  --    < > = values in this interval not displayed.    COAGULATION Recent Labs  Lab 04/12/20 0207 04/13/20 0425  INR 1.2 1.1    CARDIAC  No results for input(s): TROPONINI in the last 168 hours. No results for input(s): PROBNP in the last 168 hours.   CHEMISTRY Recent Labs  Lab 04/12/20 0016 04/12/20 0016 04/12/20 0207 04/12/20 0930 04/13/20 0425  NA 139  --   --  138 138  K 3.7   < >  --  3.8 4.5  CL 103  --   --  104 106  CO2 23  --   --  23 24  GLUCOSE 197*  --   --  204* 121*  BUN 13  --   --  18 20  CREATININE 1.59*  --   --  1.45* 1.35*  CALCIUM 8.7*  --   --  7.4* 7.9*  MG  --   --  1.7 1.5*  --   PHOS  --   --   --  4.4  --    < > = values in this interval not displayed.   Estimated Creatinine Clearance: 64.3 mL/min (A) (by C-G formula based on SCr of 1.35 mg/dL (H)).   LIVER Recent Labs  Lab 04/12/20 0016 04/12/20 0207 04/12/20 0930  04/13/20 0425  AST 16  --  22 18  ALT 10  --  10 10  ALKPHOS 107  --  78 76  BILITOT 0.7  --  1.2 0.6  PROT 7.2  --  5.9* 6.0*  ALBUMIN 3.0*  --  2.5* 2.6*  INR  --  1.2  --  1.1     INFECTIOUS Recent Labs  Lab 04/12/20 0016 04/12/20 0016 04/12/20 0207 04/12/20 0930 04/12/20 1219 04/13/20 0425  LATICACIDVEN 5.4*   < > 6.1* 4.0* 2.6*  --   PROCALCITON 0.28  --   --   --   --  0.61   < > = values in this interval not displayed.     ENDOCRINE CBG (last 3)  Recent Labs    04/13/20 0313 04/13/20 0754 04/13/20 1201  GLUCAP 100* 104* 100*         IMAGING x48h  - image(s) personally visualized  -   highlighted in bold CT ABDOMEN PELVIS W CONTRAST  Result Date: 04/12/2020 CLINICAL DATA:  Abdominal pain, unresponsive and nonverbal EXAM: CT ABDOMEN AND PELVIS WITH CONTRAST TECHNIQUE: Multidetector CT imaging of the abdomen and pelvis was performed using the standard protocol following bolus administration of intravenous contrast. CONTRAST:  122mL OMNIPAQUE IOHEXOL 300 MG/ML  SOLN COMPARISON:  Same day chest radiograph, CT pelvis 07/26/2019 FINDINGS: Lower chest: Evaluation of the lungs complicated by extensive respiratory motion artifact. Some patchy ground-glass opacity could reflect atelectatic changes or less likely infection. Normal heart size. No pericardial effusion. Few coronary artery calcifications. Hepatobiliary: Mild heterogeneity of the hepatic attenuation with a slightly nodular liver surface contour. No focal liver lesion. Moderate distention of the gallbladder. No significant gallbladder wall thickening. Pericholecystic fluid is likely redistributed from the larger volume hemoperitoneum throughout the abdomen. No visible calcified gallstones or biliary dilatation. Pancreas: No pancreatic ductal dilatation or surrounding inflammatory changes. Spleen: Partial compression of the spleen by a large subcapsular hematoma measuring approximately 8 x 6.2 x 11.7 cm in size with  a blush of active contrast extravasation arising from the superior spleen and extending into the peritoneum with sentinel clot sign throughout the left upper quadrant. Adrenals/Urinary Tract: 1 cm intermediate attenuation nodule in left adrenal gland. Kidneys are normally located with symmetric enhancement and excretion. No suspicious renal lesion, urolithiasis or hydronephrosis. Some mild circumferential thickening of the bladder is present. There is a left bladder ear deviating towards a left inguinal hernia which contains some fat hemoperitoneum as well. Stomach/Bowel: Sliding-type hiatal hernia. Stomach is unremarkable. Mild wall thickening of the small bowel including the duodenum is nonspecific given the moderate volume hemoperitoneum. Some additional nonspecific colonic mural thickening the distal transverse and splenic flexure as well. Vascular/Lymphatic: Atherosclerotic calcifications within the abdominal aorta and branch vessels. No aneurysm or ectasia. Active contrast extravasation arising from the upper pole spleen. Few prominent, likely reactive nodes present in  the inguinal region. No other conspicuous enlarged adenopathy in the abdomen or pelvis. Reproductive: Prostatomegaly with indentation of the bladder base. Few coarse eccentric calcifications of the prostate and seminal vesicles, often senescent change. Some mild corporal calcifications are noted as well. Other: Moderate volume hemoperitoneum with sentinel clot in the left upper quadrant and active contrast extravasation arising from the superior pole of the spleen extending into the peritoneum proper. Bilateral fat containing inguinal hernias with small amount of fluid and partial herniation of the left anterolateral bladder into the left inguinal hernia sac. No bowel containing hernias. Focal skin thickening and subcutaneous fat stranding along the left inguinal crease with some adjacent reactive adenopathy. Additional skin thickening and  subcutaneous fat stranding along the gluteal cleft, right greater than left with some mild thickening and inflammation which extends to the levator plate and intersphincteric plane. No clear abscess. A fistulization is difficult to exclude on CT imaging. Inflammatory changes extend below the margin of imaging within abscess in this vicinity seen on comparison pelvic CT. Additional enlarging subcutaneous soft tissue nodule along the medial right gluteal soft tissues superficially (2/86) measuring up to 1.8 by 0.9 cm, previously 0.8 x 0.4 cm. While this could reflect a dermal inclusion cyst, should correlate with visual inspection. Musculoskeletal: No acute osseous abnormality or suspicious osseous lesion. Degenerative changes in the spine, hips and pelvis. IMPRESSION: 1. Active contrast extravasation arising from the superior pole/dome of the spleen contributing both to a large subcapsular splenic hematoma and moderate volume hemoperitoneum with sentinel clot in the left upper quadrant. 2. Slightly heterogeneous and nodular liver, could correlate for features of intrinsic liver disease/cirrhosis. Consider correlation with LFTs. 3. Skin thickening and subcutaneous fat stranding along the left inguinal crease concerning for a cellulitis with some adjacent reactive adenopathy. 4. More extensive skin thickening and subcutaneous phlegmon without organized abscess along the gluteal cleft, right greater than left with extension to the intersphincteric plane. No clear abscess though inflammatory changes extend below the margin of imaging. Furthermore, fistulization is difficult to exclude on CT imaging. Recommend correlation with visual inspection. 5. Small though slightly enlarging collection contiguous with the dermis along the more posterior right gluteal soft tissues. Could reflect a small dermal inclusion cysts though should correlate with visual inspection. 6. Bilateral fat containing inguinal hernias with small amount  of fluid and partial herniation of the left anterolateral bladder into the left inguinal hernia sac. No bowel containing hernias. 7. 1 cm intermediate attenuation nodule in the left adrenal gland, incompletely characterized on this exam. Likely a benign adrenal adenoma though should consider with 1 year follow-up adrenal washout CT. This recommendation follows ACR consensus guidelines: Management of Incidental Adrenal Masses: A White Paper of the ACR Incidental Findings Committee. J Am Coll Radiol 2017;14:1038-1044. 8. Some patchy ground-glass opacity in the right lung base greater than left, possibly atelectasis versus atypical infection, difficult to delineate given extensive respiratory motion. 9. Aortic Atherosclerosis (ICD10-I70.0). Critical Value/emergent results were called by telephone at the time of interpretation on 04/12/2020 at 2:03 am to provider [facility] , who verbally acknowledged these results. Electronically Signed   By: Lovena Le M.D.   On: 04/12/2020 02:03   PERIPHERAL VASCULAR CATHETERIZATION  Result Date: 04/12/2020 See op note  DG Chest Port 1 View  Result Date: 04/13/2020 CLINICAL DATA:  Abnormal chest radiograph. Current smoker. Hypotension. Inpatient. EXAM: PORTABLE CHEST 1 VIEW COMPARISON:  Chest radiograph from one day prior. FINDINGS: Low lung volumes. Stable cardiomediastinal silhouette with normal heart size.  No pneumothorax. No pleural effusion. Increased patchy and streaky bibasilar lung opacities. No pulmonary edema. IMPRESSION: Low lung volumes. Increased patchy and streaky bibasilar lung opacities, favor increased atelectasis, difficult to exclude a component of pneumonia or aspiration. Electronically Signed   By: Ilona Sorrel M.D.   On: 04/13/2020 12:33   DG Chest Portable 1 View  Result Date: 04/12/2020 CLINICAL DATA:  Sepsis EXAM: PORTABLE CHEST 1 VIEW COMPARISON:  09/13/2007 FINDINGS: Interstitial prominence within the lungs, most pronounced in the lower  lobes. Heart is normal size. No effusions. No acute bony abnormality. IMPRESSION: Interstitial prominence predominantly in the lower lobes. This could reflect atypical infection. Electronically Signed   By: Rolm Baptise M.D.   On: 04/12/2020 00:36   CT Angio Abd/Pel w/ and/or w/o  Result Date: 04/13/2020 CLINICAL DATA:  History splenic rupture with extravasation post percutaneous coil embolization with persistent instability. EXAM: CTA ABDOMEN AND PELVIS WITHOUT AND WITH CONTRAST TECHNIQUE: Multidetector CT imaging of the abdomen and pelvis was performed using the standard protocol during bolus administration of intravenous contrast. Multiplanar reconstructed images and MIPs were obtained and reviewed to evaluate the vascular anatomy. CONTRAST:  12mL OMNIPAQUE IOHEXOL 350 MG/ML SOLN COMPARISON:  CT abdomen pelvis-04/12/2020 FINDINGS: VASCULAR Aorta: Scattered mixed calcified and noncalcified atherosclerotic plaque throughout the normal caliber abdominal aorta, not resulting in a hemodynamically significant stenosis. No evidence of abdominal aortic dissection or periaortic stranding. Celiac: Widely patent without hemodynamically significant narrowing. Conventional branching pattern. Post coil embolization of the presumed splenic artery, though evaluation degraded secondary to streak significant streak artifact associated with the embolization coils. SMA: Widely patent without hemodynamically significant narrowing though again, evaluation of the proximal aspect of the SMA is degraded secondary to streak artifact from the adjacent embolization coils. Renals: Solitary bilaterally; the bilateral renal arteries are widely patent without hemodynamically significant narrowing. No vessel irregularity to suggest FMD. IMA: Remains patent. Inflow: There is a minimal amount of calcified atherosclerotic plaque involving the bilateral common and internal iliac arteries, not resulting in hemodynamically significant stenosis.  The bilateral external iliac arteries are tortuous though of normal caliber. Proximal Outflow: There is a minimal amount of mixed calcified and noncalcified atherosclerotic plaque involving the bilateral common femoral arteries, not resulting in hemodynamically significant stenosis. Radiopaque closure device material is seen anterior to the right common femoral artery. No associated hematoma. The imaged portions of the bilateral deep and superficial femoral arteries appear widely patent. Veins: The IVC and pelvic venous systems appear widely patent. Review of the MIP images confirms the above findings. _________________________________________________________ NON-VASCULAR Lower chest: Limited visualization of the lower thorax demonstrates interval development of trace bilateral effusions with worsening bibasilar consolidative opacities and associated air bronchograms new compared to chest CT performed day prior and thus presumably secondary to atelectasis. Borderline cardiomegaly. There is diffuse decreased attenuation of the intra cardiac blood pool suggestive of anemia. Hepatobiliary: Normal hepatic contour. Vicarious excretion of contrast is seen within the gallbladder extending to the level of the CBD. No intrahepatic biliary duct dilatation. No discrete hepatic lesions. Pancreas: Normal appearance of the pancreas, though note, evaluation degraded secondary to streak artifact from embolization coils. Spleen: The spleen is again compressed secondary to continued enlargement of the perisplenic hemorrhage with exact measurements difficult to ascertain though dominant hematoma within the left upper abdominal quadrant now measuring approximate 10.2 x 7.3 cm (image 31, series 15), previously, 8.0 x 4.9 cm. The hematoma is again noted to extend along the left pericolic gutter (image 37, series 7), subjectively progressed  in the interval. There is pooling of extravasated contrast about the medial aspect of the  perisplenic hemorrhage (image 38, series 9; image 23, series 5). More simple appearing ascitic fluid is seen within the right mid hemiabdomen extending to the level of the pelvis grossly unchanged compared to the 04/12/2020 examination. Adrenals/Urinary Tract: There is symmetric enhancement and excretion of the bilateral kidneys. No renal stones. No urinary obstruction or perinephric stranding. High attenuation material is seen within the bilateral kidneys on the precontrast images suggestive of an element of renal insufficiency. Normal appearance of the bilateral adrenals glands. Normal appearance of the urinary bladder given degree of distention. Stomach/Bowel: No evidence of enteric obstruction. No pneumoperitoneum, pneumatosis or portal venous gas. Lymphatic: Bilateral inguinal lymph nodes are prominent though individually not enlarged by size criteria with index right inguinal lymph node measuring 1 cm greatest short axis diameter (image 98, series 15 and index left inguinal lymph node measuring 0.9 cm (image 101 series 15), likely reactive in etiology. No bulky retroperitoneal, mesenteric, pelvic or inguinal lymphadenopathy. Reproductive: Normal appearance the prostate gland. Other: Diffuse body wall anasarca. Small mesenteric fat and ascitic fluid containing inguinal hernia. Musculoskeletal: No acute or aggressive osseous abnormalities. Stigmata of dish within the thoracic spine. Mild to moderate multilevel lumbar spine DDD, worse at L4-L5 with disc space height loss, endplate irregularity and sclerosis. IMPRESSION: 1. Continued enlargement of perisplenic hemorrhage with pooling of extravasated contrast about the superomedial aspect of the enlarging hematoma. 2. Post percutaneous coil embolization of the splenic artery, suboptimally evaluated due to associated streak artifact from the embolization coils. 3.  Aortic Atherosclerosis (ICD10-I70.0). 4. Additional ancillary findings as discussed above and on  preceding abdominal CT performed 10/06/2009. Critical Value/emergent results were called by telephone at the time of interpretation on 04/13/2020 at 2:52 pm to provider PABON, who verbally acknowledged these results. Electronically Signed   By: Sandi Mariscal M.D.   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  Status post control with status post splenectomy   P:  - PRBC for hgb </= 6.9gm%    - exceptions  are   -  if ACS susepcted/confirmed then transfuse for hgb </= 8.0gm%,  or    -  active bleeding with hemodynamic instability, then transfuse  regardless of hemoglobin value   At at all times try to transfuse 1 unit prbc as possible with exception of active hemorrhage   HEMATOLOGIC - Platelets A At risk for thrombocytopenia Status post hemorrhage  P SCDs. Hold off on antiplatelet agents till risk for hemorrhage or hemorrhage recurrence disappears  ENDOCRINE A:   At risk for hypo and hyperglycemia P:   SSI     Best practice:  Diet: npo Pain/Anxiety/Delirium protocol (if indicated): as above VAP protocol (if indicated): yses DVT prophylaxis: scd GI prophylaxis: ppi Glucose control: ssi Mobility: bed rest Code Status: full  Family Communication: sister Gene Paris - [phone] -> says the number I calling from which is the ICU at [facility] is not accepting calls because the call is blocked by a call blocker.  I then called son Karmen Bongo on [phone] and updted -> 6:37 PM - he denied any questions   Disposition: icu   Global: Keep sedated with propofol and just as needed medications.  Check stat labs.  If he is stable on 04/14/2020 morning then plan to extubate.  ATTESTATION & SIGNATURE   The patient ROHITH FAUTH is critically ill with multiple organ systems failure and requires high complexity decision making for assessment and support, frequent evaluation and titration of therapies, application of advanced monitoring technologies and extensive interpretation of multiple databases.   Critical Care Time devoted to patient care services described in this note is  60  Minutes. This time reflects time of care of this signee Dr Brand Males. This critical care time does not reflect procedure time, or teaching time or supervisory time of PA/NP/Med student/Med Resident etc but could involve care discussion time     Dr. Brand Males, M.D., [facility].C.P Pulmonary and Critical Care Medicine Staff Physician [facility] Pager: [phone], If no answer or  between  15:00h - 7:00h: call [phone]  04/13/2020 6:34 PM

## 2020-04-13 NOTE — Progress Notes (Addendum)
1700 Returned from OR intubated, paralyzed and on Propofol. VSS Midline incision clean dry and intact. Honeycomb dressing over incision. Foley # 16 fr.present and left lower quadrant JP drain. JP drainage bloody in color.

## 2020-04-13 NOTE — Anesthesia Procedure Notes (Signed)
Procedure Name: Intubation Date/Time: 04/13/2020 3:15 PM Performed by: Sherrine Maples, CRNA Pre-anesthesia Checklist: Patient identified, Patient being monitored, Timeout performed, Emergency Drugs available and Suction available Patient Re-evaluated:Patient Re-evaluated prior to induction Oxygen Delivery Method: Circle system utilized Preoxygenation: Pre-oxygenation with 100% oxygen Induction Type: IV induction and Rapid sequence Laryngoscope Size: Miller and 2 Grade View: Grade I Tube type: Oral Number of attempts: 1 Airway Equipment and Method: Stylet Placement Confirmation: ETT inserted through vocal cords under direct vision,  positive ETCO2 and breath sounds checked- equal and bilateral Secured at: 21 cm Tube secured with: Tape Dental Injury: Teeth and Oropharynx as per pre-operative assessment

## 2020-04-13 NOTE — Anesthesia Preprocedure Evaluation (Addendum)
Anesthesia Evaluation  Patient identified by MRN, date of birth, ID band Patient confused    Reviewed: Allergy & Precautions, H&P , NPO status , Patient's Chart, lab work & pertinent test results  History of Anesthesia Complications Negative for: history of anesthetic complications  Airway Mallampati: III  TM Distance: >3 FB Neck ROM: limited    Dental  (+) Chipped, Poor Dentition, Missing   Pulmonary sleep apnea , Current Smoker and Patient abstained from smoking.,    Pulmonary exam normal        Cardiovascular hypertension, (-) Past MI  Rhythm:regular Rate:Tachycardia     Neuro/Psych PSYCHIATRIC DISORDERS negative neurological ROS     GI/Hepatic negative GI ROS, (+)     substance abuse  ,   Endo/Other  negative endocrine ROS  Renal/GU      Musculoskeletal  (+) Arthritis ,   Abdominal   Peds  Hematology  (+) Blood dyscrasia, anemia ,   Anesthesia Other Findings Splenic bleed  Past Medical History: No date: Anxiety No date: Arthritis No date: Hypertension  Past Surgical History: No date: CYST REMOVAL TRUNK     Comment:  Groin area around 2000 04/12/2020: EMBOLIZATION; N/A     Comment:  Procedure: EMBOLIZATION, spleen;  Surgeon: Algernon Huxley,              MD;  Location: [place] [facility];  Service:               Cardiovascular;  Laterality: N/A; 07/26/2019: INCISION AND DRAINAGE ABSCESS; N/A     Comment:  Procedure: INCISION AND DRAINAGE ABSCESS;  Surgeon:               Hollice Espy, MD;  Location: [facility];  Service:               Urology;  Laterality: N/A; 07/26/2019: INCISION AND DRAINAGE ABSCESS; N/A     Comment:  Procedure: INCISION AND DRAINAGE ABSCESS;  Surgeon:               Benjamine Sprague, DO;  Location: [facility];  Service: General;              Laterality: N/A;  BMI    Body Mass Index: 23.49 kg/m      Reproductive/Obstetrics negative OB ROS                             Anesthesia Physical Anesthesia Plan  ASA: V and emergent  Anesthesia Plan: General ETT   Post-op Pain Management:    Induction: Intravenous  PONV Risk Score and Plan: Ondansetron, Dexamethasone, Midazolam and Treatment may vary due to age or medical condition  Airway Management Planned: Oral ETT  Additional Equipment:   Intra-op Plan:   Post-operative Plan: Extubation in OR and Possible Post-op intubation/ventilation  Informed Consent: I have reviewed the patients History and Physical, chart, labs and discussed the procedure including the risks, benefits and alternatives for the proposed anesthesia with the patient or authorized representative who has indicated his/her understanding and acceptance.     Dental Advisory Given  Plan Discussed with: Anesthesiologist, CRNA and Surgeon  Anesthesia Plan Comments: (History and phone consent from the patients son Karmen Bongo at [phone]   Son consented for risks of anesthesia including but not limited to:  - adverse reactions to medications - damage to eyes, teeth, lips or other oral mucosa - nerve damage due to positioning  - sore throat or  hoarseness - Damage to heart, brain, nerves, lungs, other parts of body or loss of life  He voiced understanding)      Anesthesia Quick Evaluation

## 2020-04-13 NOTE — Progress Notes (Signed)
1 Day Post-Op   Subjective/Chief Complaint: Patient s/p splenic embolization. HgB trending down.   Objective: Vital signs in last 24 hours: Temp:  [96.9 F (36.1 C)-98.8 F (37.1 C)] 98.6 F (37 C) (11/06 0757) Pulse Rate:  [80-105] 90 (11/06 1100) Resp:  [13-26] 21 (11/06 1100) BP: (106-155)/(64-108) 130/87 (11/06 0757) SpO2:  [86 %-100 %] 94 % (11/06 1100) Last BM Date: 04/12/20  Intake/Output from previous day: 11/05 0701 - 11/06 0700 In: 4125.8 [I.V.:3282.5; IV Piggyback:843.3] Out: 1100 [Urine:1100] Intake/Output this shift: Total I/O In: 178.3 [I.V.:149.9; IV Piggyback:28.4] Out: 75 [Urine:75]  General appearance: Sleeping, NAD GI: soft, distended, tender to palpation, RIGHT groin access site, soft, dry.  Lab Results:  Recent Labs    04/12/20 0930 04/12/20 1612 04/13/20 0425 04/13/20 1212  WBC 19.9*  --  18.9*  --   HGB 9.9*   < > 7.8* 7.3*  HCT 30.7*   < > 24.1* 22.0*  PLT 217  --  211  --    < > = values in this interval not displayed.   BMET Recent Labs    04/12/20 0930 04/13/20 0425  NA 138 138  K 3.8 4.5  CL 104 106  CO2 23 24  GLUCOSE 204* 121*  BUN 18 20  CREATININE 1.45* 1.35*  CALCIUM 7.4* 7.9*   PT/INR Recent Labs    04/12/20 0207 04/13/20 0425  LABPROT 14.9 13.4  INR 1.2 1.1   ABG No results for input(s): PHART, HCO3 in the last 72 hours.  Invalid input(s): PCO2, PO2  Studies/Results: CT ABDOMEN PELVIS W CONTRAST  Result Date: 04/12/2020 CLINICAL DATA:  Abdominal pain, unresponsive and nonverbal EXAM: CT ABDOMEN AND PELVIS WITH CONTRAST TECHNIQUE: Multidetector CT imaging of the abdomen and pelvis was performed using the standard protocol following bolus administration of intravenous contrast. CONTRAST:  151mL OMNIPAQUE IOHEXOL 300 MG/ML  SOLN COMPARISON:  Same day chest radiograph, CT pelvis 07/26/2019 FINDINGS: Lower chest: Evaluation of the lungs complicated by extensive respiratory motion artifact. Some patchy ground-glass  opacity could reflect atelectatic changes or less likely infection. Normal heart size. No pericardial effusion. Few coronary artery calcifications. Hepatobiliary: Mild heterogeneity of the hepatic attenuation with a slightly nodular liver surface contour. No focal liver lesion. Moderate distention of the gallbladder. No significant gallbladder wall thickening. Pericholecystic fluid is likely redistributed from the larger volume hemoperitoneum throughout the abdomen. No visible calcified gallstones or biliary dilatation. Pancreas: No pancreatic ductal dilatation or surrounding inflammatory changes. Spleen: Partial compression of the spleen by a large subcapsular hematoma measuring approximately 8 x 6.2 x 11.7 cm in size with a blush of active contrast extravasation arising from the superior spleen and extending into the peritoneum with sentinel clot sign throughout the left upper quadrant. Adrenals/Urinary Tract: 1 cm intermediate attenuation nodule in left adrenal gland. Kidneys are normally located with symmetric enhancement and excretion. No suspicious renal lesion, urolithiasis or hydronephrosis. Some mild circumferential thickening of the bladder is present. There is a left bladder ear deviating towards a left inguinal hernia which contains some fat hemoperitoneum as well. Stomach/Bowel: Sliding-type hiatal hernia. Stomach is unremarkable. Mild wall thickening of the small bowel including the duodenum is nonspecific given the moderate volume hemoperitoneum. Some additional nonspecific colonic mural thickening the distal transverse and splenic flexure as well. Vascular/Lymphatic: Atherosclerotic calcifications within the abdominal aorta and branch vessels. No aneurysm or ectasia. Active contrast extravasation arising from the upper pole spleen. Few prominent, likely reactive nodes present in the inguinal region. No other conspicuous  enlarged adenopathy in the abdomen or pelvis. Reproductive: Prostatomegaly with  indentation of the bladder base. Few coarse eccentric calcifications of the prostate and seminal vesicles, often senescent change. Some mild corporal calcifications are noted as well. Other: Moderate volume hemoperitoneum with sentinel clot in the left upper quadrant and active contrast extravasation arising from the superior pole of the spleen extending into the peritoneum proper. Bilateral fat containing inguinal hernias with small amount of fluid and partial herniation of the left anterolateral bladder into the left inguinal hernia sac. No bowel containing hernias. Focal skin thickening and subcutaneous fat stranding along the left inguinal crease with some adjacent reactive adenopathy. Additional skin thickening and subcutaneous fat stranding along the gluteal cleft, right greater than left with some mild thickening and inflammation which extends to the levator plate and intersphincteric plane. No clear abscess. A fistulization is difficult to exclude on CT imaging. Inflammatory changes extend below the margin of imaging within abscess in this vicinity seen on comparison pelvic CT. Additional enlarging subcutaneous soft tissue nodule along the medial right gluteal soft tissues superficially (2/86) measuring up to 1.8 by 0.9 cm, previously 0.8 x 0.4 cm. While this could reflect a dermal inclusion cyst, should correlate with visual inspection. Musculoskeletal: No acute osseous abnormality or suspicious osseous lesion. Degenerative changes in the spine, hips and pelvis. IMPRESSION: 1. Active contrast extravasation arising from the superior pole/dome of the spleen contributing both to a large subcapsular splenic hematoma and moderate volume hemoperitoneum with sentinel clot in the left upper quadrant. 2. Slightly heterogeneous and nodular liver, could correlate for features of intrinsic liver disease/cirrhosis. Consider correlation with LFTs. 3. Skin thickening and subcutaneous fat stranding along the left inguinal  crease concerning for a cellulitis with some adjacent reactive adenopathy. 4. More extensive skin thickening and subcutaneous phlegmon without organized abscess along the gluteal cleft, right greater than left with extension to the intersphincteric plane. No clear abscess though inflammatory changes extend below the margin of imaging. Furthermore, fistulization is difficult to exclude on CT imaging. Recommend correlation with visual inspection. 5. Small though slightly enlarging collection contiguous with the dermis along the more posterior right gluteal soft tissues. Could reflect a small dermal inclusion cysts though should correlate with visual inspection. 6. Bilateral fat containing inguinal hernias with small amount of fluid and partial herniation of the left anterolateral bladder into the left inguinal hernia sac. No bowel containing hernias. 7. 1 cm intermediate attenuation nodule in the left adrenal gland, incompletely characterized on this exam. Likely a benign adrenal adenoma though should consider with 1 year follow-up adrenal washout CT. This recommendation follows ACR consensus guidelines: Management of Incidental Adrenal Masses: A White Paper of the ACR Incidental Findings Committee. J Am Coll Radiol 2017;14:1038-1044. 8. Some patchy ground-glass opacity in the right lung base greater than left, possibly atelectasis versus atypical infection, difficult to delineate given extensive respiratory motion. 9. Aortic Atherosclerosis (ICD10-I70.0). Critical Value/emergent results were called by telephone at the time of interpretation on 04/12/2020 at 2:03 am to provider [address] [facility] , who verbally acknowledged these results. Electronically Signed   By: Lovena Le M.D.   On: 04/12/2020 02:03   PERIPHERAL VASCULAR CATHETERIZATION  Result Date: 04/12/2020 See op note  DG Chest Port 1 View  Result Date: 04/13/2020 CLINICAL DATA:  Abnormal chest radiograph. Current smoker. Hypotension. Inpatient.  EXAM: PORTABLE CHEST 1 VIEW COMPARISON:  Chest radiograph from one day prior. FINDINGS: Low lung volumes. Stable cardiomediastinal silhouette with normal heart size. No pneumothorax. No pleural effusion.  Increased patchy and streaky bibasilar lung opacities. No pulmonary edema. IMPRESSION: Low lung volumes. Increased patchy and streaky bibasilar lung opacities, favor increased atelectasis, difficult to exclude a component of pneumonia or aspiration. Electronically Signed   By: Ilona Sorrel M.D.   On: 04/13/2020 12:33   DG Chest Portable 1 View  Result Date: 04/12/2020 CLINICAL DATA:  Sepsis EXAM: PORTABLE CHEST 1 VIEW COMPARISON:  09/13/2007 FINDINGS: Interstitial prominence within the lungs, most pronounced in the lower lobes. Heart is normal size. No effusions. No acute bony abnormality. IMPRESSION: Interstitial prominence predominantly in the lower lobes. This could reflect atypical infection. Electronically Signed   By: Rolm Baptise M.D.   On: 04/12/2020 00:36    Anti-infectives: Anti-infectives (From admission, onward)   Start     Dose/Rate Route Frequency Ordered Stop   04/13/20 1200  doxycycline (VIBRAMYCIN) 100 mg in sodium chloride 0.9 % 250 mL IVPB        100 mg 125 mL/hr over 120 Minutes Intravenous Every 12 hours 04/13/20 1112     04/12/20 2200  cefTRIAXone (ROCEPHIN) 2 g in sodium chloride 0.9 % 100 mL IVPB        2 g 200 mL/hr over 30 Minutes Intravenous Every 24 hours 04/12/20 1530     04/12/20 2200  metroNIDAZOLE (FLAGYL) IVPB 500 mg        500 mg 100 mL/hr over 60 Minutes Intravenous Every 8 hours 04/12/20 1530     04/12/20 1300  vancomycin (VANCOREADY) IVPB 750 mg/150 mL  Status:  Discontinued        750 mg 150 mL/hr over 60 Minutes Intravenous Every 12 hours 04/12/20 0544 04/13/20 1112   04/12/20 0600  meropenem (MERREM) 1 g in sodium chloride 0.9 % 100 mL IVPB  Status:  Discontinued        1 g 200 mL/hr over 30 Minutes Intravenous Every 8 hours 04/12/20 0526 04/12/20 1530    04/12/20 0545  vancomycin (VANCOREADY) IVPB 750 mg/150 mL        750 mg 150 mL/hr over 60 Minutes Intravenous  Once 04/12/20 0537 04/12/20 0949   04/12/20 0530  vancomycin (VANCOCIN) IVPB 1000 mg/200 mL premix  Status:  Discontinued        1,000 mg 200 mL/hr over 60 Minutes Intravenous  Once 04/12/20 0526 04/12/20 0650   04/12/20 0524  ceFAZolin (ANCEF) IVPB 2g/100 mL premix        2 g 200 mL/hr over 30 Minutes Intravenous 30 min pre-op 04/12/20 0524 04/12/20 0554   04/12/20 0030  piperacillin-tazobactam (ZOSYN) IVPB 3.375 g        3.375 g 100 mL/hr over 30 Minutes Intravenous  Once 04/12/20 0021 04/12/20 0213   04/12/20 0030  vancomycin (VANCOCIN) IVPB 1000 mg/200 mL premix        1,000 mg 200 mL/hr over 60 Minutes Intravenous  Once 04/12/20 0021 04/12/20 0236      Assessment/Plan: s/p Procedure(s): EMBOLIZATION, spleen (N/A) No further intervention from Vascular Surgery recommended  Further care per General Surgery  LOS: 1 day    Jamesetta So A 04/13/2020

## 2020-04-13 NOTE — Op Note (Addendum)
PROCEDURES: Exploratory laparotomy with splenectomy   Pre-operative Diagnosis: ruptured spleen, polysubstance abuse  Post-operative Diagnosis: same  Surgeon: Marjory Lies Delonda Coley   Assistants: Dr. Lorenso Courier, required the due to the complexity and emergent nature of this case, for exposure and due to the lack of qualified first assistant  Anesthesia: General endotracheal anesthesia  ASA Class: 3  Surgeon: Caroleen Hamman , MD FACS  Anesthesia: Gen. with endotracheal tube  Findings: Ruptured spleen with 3 lts of hemoperitoneum and clots  Estimated Blood Loss:          Drains: 19 blake          Specimens: spleen         Complications: none              Condition: Guarded intubated  Procedure Details  The patient was seen again in the Holding Room. The benefits, complications, treatment options, and expected outcomes were discussed with the patient. The risks of bleeding, infection, recurrence of symptoms, failure to resolve symptoms,  bowel injury, any of which could require further surgery were reviewed with the patient.   The patient was taken to Operating Room, identified as Caleb Brooks and the procedure verified.  A Time Out was held and the above information confirmed.  Prior to the induction of general anesthesia, antibiotic prophylaxis was administered. VTE prophylaxis was in place. General endotracheal anesthesia was then administered and tolerated well. After the induction, the abdomen was prepped with Chloraprep and draped in the sterile fashion. The patient was positioned in the supine position. Upper midline laparotomy was performed with a 10 blade knife electrocautery was used to dissect through subtenons tissue and the fascia was elevated between [name] clamps and the abdominal cavity was entered under direct visualization. Massive hemoperitoneum was encountered we evacuated about 3 L of hemoperitoneum and clots. We were able to use the Cell Saver to safe as much blood as possible.  The left upper quadrant was packed in the standard trauma laparotomy fashion. Were able to then visualize the left upper quadrant after appropriate pressure was held in the splenic pedicle. There was evidence of splenic bruising and a rupture. Using multiple long Kelly clamps were able to clamp the pedicle with the splenic vein and artery individually. We suture-ligated each vessels and remove the spleen. There were also some short gastric vessels that were oozing and we were able to control them with LigaSure device. There was a very small area towards the tail of the pancreas that was oozing and we were able to place Surgicel as well as Ewa seal and pressure for 5 minutes with good hemostatic control. We completed exploration running the small bowel from the ligament of Treitz to the terminal ileum as well as the large bowel and the liver. There was no evidence of any other injuries. The stomach was also without any pathology.  I placed a 19 Blake drain within the left upper quadrant and secured to the skin using 3-0 nylon. Liposomal Marcaine was used on the abdominal wall for postoperative analgesia. The midline laparotomy was closed using the small bite technique with a 0 PDS suture. Skin was closed with staples. Needle and laparotomy counts were correct. We also use the lap detector and did not show any retained laps. Please note that this was an emergent procedure requiring complex resuscitation intraoperatively with Cell Saver as well as 3 units of blood that were given intraoperatively. He was transferred to the ICU intubated in a stable but guarded  condition. Son updated  [place], MD, [employer]

## 2020-04-13 NOTE — Anesthesia Postprocedure Evaluation (Signed)
Anesthesia Post Note  Patient: Caleb Brooks  Procedure(s) Performed: EXPLORATORY LAPAROTOMY (N/A )  Patient location during evaluation: SICU Anesthesia Type: General Level of consciousness: sedated Pain management: pain level controlled Vital Signs Assessment: post-procedure vital signs reviewed and stable Respiratory status: patient remains intubated per anesthesia plan Cardiovascular status: stable Postop Assessment: no apparent nausea or vomiting Anesthetic complications: no   No complications documented.   Last Vitals:  Vitals:   04/13/20 2300 04/13/20 2303  BP:    Pulse:    Resp:    Temp: 36.7 C   SpO2:  98%    Last Pain:  Vitals:   04/13/20 2300  TempSrc: Axillary  PainSc:                  Precious Haws Sumiya Mamaril

## 2020-04-13 NOTE — Progress Notes (Signed)
PROGRESS NOTE    [name]  ZHG:[id number] DOB: 09-15-1960 DOA: 04/12/2020 PCP: Langston Reusing, NP   Chief Complaint  Patient presents with  . Hypotension   Brief Narrative:  Caleb Brooks  is Caleb Brooks 59 y.o. African-American male with Blu Lori known history of hypertension, hidradenitis suppurativa complicated by recurrent boils and keloid formation anxiety and osteoarthritis as well as EtOH and tobacco abuse, presented to the emergency room with acute onset of syncope twice with associated lightheadedness and dizziness.  The patient was noted to be hypotensive with Zilda No BP of 54/30 by EMS.  He has been having purulent drainage from his perineal boils for Caleb Brooks few days with associated swelling and severe pain that has been constant.  He admits to left upper quadrant abdominal pain after having Caleb Brooks fall on his side.  No nausea or vomiting or melena or bright red bleeding per rectum.  No dyspnea or cough or wheezing or hemoptysis.  No other bleeding diathesis.  No chest pain or palpitations.  Upon presentation to the emergency room, blood pressure was initially 130/82 with Andrian Urbach heart rate of 81 and respiratory to 27 and later respiratory it was up to 43 and blood pressure dropped to 87/64 with respiratory rate of 22-30.  BP later on improved with hydration to 122/92 with normal map.  He was afebrile and has been maintaining pulse ox symmetry of 97 to 99% on room air.  Labs revealed [name] blood glucose of 197 and [name] BUN of 13 with [name] creatinine 1.59 compared to 14 and 1.15 on 03/14/2020.  Albumin was 3 and lactic acid 5.4 with procalcitonin 0.28.  CBC showed leukocytosis of 15.9 and anemia with hemoglobin of 10.9 hematocrit of 33.5 compared to 15.4 and 47.2 Corin Tilly month ago.  Platelets were 292.  Alcohol levels less than 10.  Portable chest x-ray showed interstitial prominence predominantly in the lower lobes that may represent atypical infection.  Abdominal and pelvic CT scan showed the following: 1. Active contrast extravasation  arising from the superior pole/dome of the spleen contributing both to Caleb Brooks large subcapsular splenic hematoma and moderate volume hemoperitoneum with sentinel clot in the left upper quadrant. 2. Slightly heterogeneous and nodular liver, could correlate for features of intrinsic liver disease/cirrhosis. Consider correlation with LFTs. 3. Skin thickening and subcutaneous fat stranding along the left inguinal crease concerning for Caleb Brooks cellulitis with some adjacent reactive adenopathy. 4. More extensive skin thickening and subcutaneous phlegmon without organized abscess along the gluteal cleft, right greater than left with extension to the intersphincteric plane. No clear abscess though inflammatory changes extend below the margin of imaging. Furthermore, fistulization is difficult to exclude on CT imaging. Recommend correlation with visual inspection. 5. Small though slightly enlarging collection contiguous with the dermis along the more posterior right gluteal soft tissues. Could reflect Caleb Brooks small dermal inclusion cysts though should correlate with visual inspection. 6. Bilateral fat containing inguinal hernias with small amount of fluid and partial herniation of the left anterolateral bladder into the left inguinal hernia sac. No bowel containing hernias. 7. 1 cm intermediate attenuation nodule in the left adrenal gland, incompletely characterized on this exam. Likely Caleb Brooks benign adrenal adenoma though should consider with 1 year follow-up adrenal washout CT. This recommendation follows ACR consensus guidelines: Management of Incidental Adrenal Masses: Caleb Brooks. J Am Coll Radiol 2017;14:1038-1044. 8. Some patchy ground-glass opacity in the right lung base greater than left, possibly atelectasis versus atypical infection, difficult to delineate given  extensive respiratory motion. 9. Aortic Atherosclerosis  The patient was given IV vancomycin and  Zosyn, 2 L bolus of IV lactated Ringer and 3 units of packed red blood cells as well as 1 g of IV tranexamic acid.  Dr. Lysle Pearl was notified about the patient and recommended vascular surgery consult.  Dr. Lucky Cowboy was notified but the patient and is taking him to the OR for embolectomy.  I was asked by the ER physician to admit the patient per Dr. Lysle Pearl and Dr. Bunnie Domino recommendation.  The patient will be admitted to progressive unit bed for further evaluation and management.  Assessment & Plan:   Active Problems:   Splenic hemorrhage   Hemoperitoneum  1.  Acute splenic hemorrhage with subsequent hemorrhagic shock and syncope twice  Acute Blood Loss Anemia  Hemoperitoneum -- unclear if spontaneous or 2/2 trauma from fall from standing -- CT with active contrast extravasation from superior pole/dome of spleen contributing to large subcapsular splenic hematoma and moderate volume hemoperitoneum with sentinel clot in LUQ (see report) -- repeat CT with contrast today per surgery, NPO, may need splenectomy today if worsening - discussed with Dr. Dahlia Byes - s/p coil embolization of splenic artery 11/5 - s/p 3 units pRBC, continue IVF for now -- Hb downtrending, follow repeat, transfuse prn -- lactate downtrended -- if ultimately needs splenectomy will need vaccination against encapsulated organisms (per uptodate, for embolization, may not need, can discuss) -- vascular surgery c/s, appreciate assistance -- general surgery c/s, appreciate assistance -- analgesia prn   2.  Perineal severe suppurative cellulitis with history of hidradenitis suppurativa -- suspect primary reason for shock was from acute blood loss anemia rather than sepsis, though pt notes worsening drainage recently  -- will continue abx therapy for now -- appreciate general surgery c/s - recommending outpatient follow up -- follow blood cultures, negative mrsa pcr  # Leukocytosis: likely reactive, follow repeat CT scan.  Possibly related  to cellulitis above?  Continue abx.  Watch for si/sx of infection especially in setting of hemoperitoneum above.   # Acute Metabolic Encephalopathy: seems lethargic this AM, though awakens and answers questions appropriately, suspect 2/2 sedating medications and acute illness.  Delirium precautions.  Continue to monitor.  # NSVT: follow echo  # AKI: 2/2 hemorrhagic shock, follow - improved  # Abnormal CXR: possible atypical infection, he's on abx, follow serial CXR, pending.  Negative covid/flu.  # Possible Liver Disease vs Cirrhosis -- LFT's wnl, follow outpatient.  Normal INR, bili, platelets.  3.  Tobacco abuse. -He will be counseled for smoking cessation.  4.  Alcohol abuse. -He will be monitored for withdrawal. -IV banana bag will be provided. -As needed IV Ativan will be given for withdrawal.  5.  DVT prophylaxis. -SCDs. -Medical prophylaxis currently contraindicated due to splenic hemorrhage.  DVT prophylaxis: SCD Code Status: full  Family Communication: none at bedside Disposition:   Status is: Inpatient  Remains inpatient appropriate because:Inpatient level of care appropriate due to severity of illness   Dispo: The patient is from: Home              Anticipated d/c is to: Home              Anticipated d/c date is: > 3 days              Patient currently is not medically stable to d/c.  Consultants:   Surgery  Vascular   Procedures: \ Procedure(s) Performed: 1. Ultrasound guidance for vascular access right  femoral artery 2. Catheter placement into splenic artery from right femoral approach 3. Aortogram and selective angiogram of the celiac artery and of the splenic artery 4. Coil embolization of the splenic artery with Reganne Messerschmidt total of 6 Ruby coils 5. StarClose closure device right femoral artery  Antimicrobials:  Anti-infectives (From admission, onward)   Start     Dose/Rate Route  Frequency Ordered Stop   04/13/20 1200  doxycycline (VIBRAMYCIN) 100 mg in sodium chloride 0.9 % 250 mL IVPB        100 mg 125 mL/hr over 120 Minutes Intravenous Every 12 hours 04/13/20 1112     04/12/20 2200  cefTRIAXone (ROCEPHIN) 2 g in sodium chloride 0.9 % 100 mL IVPB        2 g 200 mL/hr over 30 Minutes Intravenous Every 24 hours 04/12/20 1530     04/12/20 2200  metroNIDAZOLE (FLAGYL) IVPB 500 mg        500 mg 100 mL/hr over 60 Minutes Intravenous Every 8 hours 04/12/20 1530     04/12/20 1300  vancomycin (VANCOREADY) IVPB 750 mg/150 mL  Status:  Discontinued        750 mg 150 mL/hr over 60 Minutes Intravenous Every 12 hours 04/12/20 0544 04/13/20 1112   04/12/20 0600  meropenem (MERREM) 1 g in sodium chloride 0.9 % 100 mL IVPB  Status:  Discontinued        1 g 200 mL/hr over 30 Minutes Intravenous Every 8 hours 04/12/20 0526 04/12/20 1530   04/12/20 0545  vancomycin (VANCOREADY) IVPB 750 mg/150 mL        750 mg 150 mL/hr over 60 Minutes Intravenous  Once 04/12/20 0537 04/12/20 0949   04/12/20 0530  vancomycin (VANCOCIN) IVPB 1000 mg/200 mL premix  Status:  Discontinued        1,000 mg 200 mL/hr over 60 Minutes Intravenous  Once 04/12/20 0526 04/12/20 0650   04/12/20 0524  ceFAZolin (ANCEF) IVPB 2g/100 mL premix        2 g 200 mL/hr over 30 Minutes Intravenous 30 min pre-op 04/12/20 0524 04/12/20 0554   04/12/20 0030  piperacillin-tazobactam (ZOSYN) IVPB 3.375 g        3.375 g 100 mL/hr over 30 Minutes Intravenous  Once 04/12/20 0021 04/12/20 0213   04/12/20 0030  vancomycin (VANCOCIN) IVPB 1000 mg/200 mL premix        1,000 mg 200 mL/hr over 60 Minutes Intravenous  Once 04/12/20 0021 04/12/20 0236      Subjective: Continued abdominal pain  Objective: Vitals:   04/13/20 0800 04/13/20 0900 04/13/20 1000 04/13/20 1100  BP:      Pulse: 98 88 93 90  Resp: 16 (!) 26 20 (!) 21  Temp:      TempSrc:      SpO2: 95% 95% 97% 94%  Weight:      Height:        Intake/Output  Summary (Last 24 hours) at 04/13/2020 1128 Last data filed at 04/13/2020 1000 Gross per 24 hour  Intake 4304.09 ml  Output 1175 ml  Net 3129.09 ml   Filed Weights   04/12/20 0013 04/12/20 0620  Weight: 74.8 kg 76.4 kg    Examination:  General: No acute distress. Cardiovascular: Heart sounds show AmeLie Hollars regular rate, and rhythm. Lungs: Clear to auscultation bilaterally Abdomen: distended, diffusely tender to palpation (similar to yesterday, overall) Neurological:Lethargic, but awakens and will answer questions appropriately. Moves all extremities 4. Cranial nerves II through XII grossly intact. Skin: Warm and  dry. No rashes or lesions. Extremities: No clubbing or cyanosis. No edema.  Data Reviewed: I have personally reviewed following labs and imaging studies  CBC: Recent Labs  Lab 04/12/20 0016 04/12/20 0930 04/12/20 1612 04/13/20 0425  WBC 15.9* 19.9*  --  18.9*  NEUTROABS 13.4* 17.0*  --   --   HGB 10.9* 9.9* 9.1* 7.8*  HCT 33.5* 30.7* 27.7* 24.1*  MCV 96.3 91.4  --  92.3  PLT 292 217  --  767    Basic Metabolic Panel: Recent Labs  Lab 04/12/20 0016 04/12/20 0207 04/12/20 0930 04/13/20 0425  NA 139  --  138 138  K 3.7  --  3.8 4.5  CL 103  --  104 106  CO2 23  --  23 24  GLUCOSE 197*  --  204* 121*  BUN 13  --  18 20  CREATININE 1.59*  --  1.45* 1.35*  CALCIUM 8.7*  --  7.4* 7.9*  MG  --  1.7 1.5*  --   PHOS  --   --  4.4  --     GFR: Estimated Creatinine Clearance: 64.3 mL/min (Tayia Stonesifer) (by C-G formula based on SCr of 1.35 mg/dL (H)).  Liver Function Tests: Recent Labs  Lab 04/12/20 0016 04/12/20 0930 04/13/20 0425  AST 16 22 18   ALT 10 10 10   ALKPHOS 107 78 76  BILITOT 0.7 1.2 0.6  PROT 7.2 5.9* 6.0*  ALBUMIN 3.0* 2.5* 2.6*    CBG: Recent Labs  Lab 04/12/20 1550 04/12/20 2015 04/12/20 2322 04/13/20 0313 04/13/20 0754  GLUCAP 130* 137* 125* 100* 104*     Recent Results (from the past 240 hour(s))  Blood culture (routine x 2)     Status:  None (Preliminary result)   Collection Time: 04/12/20  2:07 AM   Specimen: BLOOD  Result Value Ref Range Status   Specimen Description BLOOD LINE RIGHT HAND  Final   Special Requests   Final    BOTTLES DRAWN AEROBIC AND ANAEROBIC Blood Culture results may not be optimal due to an inadequate volume of blood received in culture bottles   Culture   Final    NO GROWTH 1 DAY Performed at [facility], [address] [place], [place] [address]    Report Status PENDING  Incomplete  Blood culture (routine x 2)     Status: None (Preliminary result)   Collection Time: 04/12/20  2:07 AM   Specimen: BLOOD  Result Value Ref Range Status   Specimen Description BLOOD RIGHT HAND  Final   Special Requests   Final    BOTTLES DRAWN AEROBIC AND ANAEROBIC Blood Culture adequate volume   Culture   Final    NO GROWTH 1 DAY Performed at [facility], [address], [place], [place] [address]    Report Status PENDING  Incomplete  Respiratory Panel by RT PCR (Flu Aniketh Huberty&B, Covid) - Nasopharyngeal Swab     Status: None   Collection Time: 04/12/20  2:07 AM   Specimen: Nasopharyngeal Swab  Result Value Ref Range Status   SARS Coronavirus 2 by RT PCR NEGATIVE NEGATIVE Final    Comment: (NOTE) SARS-CoV-2 target nucleic acids are NOT DETECTED.  The SARS-CoV-2 RNA is generally detectable in upper respiratoy specimens during the acute phase of infection. The lowest concentration of SARS-CoV-2 viral copies this assay can detect is 131 copies/mL. Tayt Moyers negative result does not preclude SARS-Cov-2 infection and should not be used as the sole basis for treatment  or other patient management decisions. Tanisha Lutes negative result may occur with  improper specimen collection/handling, submission of specimen other than nasopharyngeal swab, presence of viral mutation(s) within the areas targeted by this assay, and inadequate number of viral copies (<131 copies/mL). Shonna Deiter negative result must be combined with  clinical observations, patient history, and epidemiological information. The expected result is Negative.  Fact Sheet for Patients:  PinkCheek.be  Fact Sheet for Healthcare Providers:  GravelBags.it  This test is no t yet approved or cleared by the [place] FDA and  has been authorized for detection and/or diagnosis of SARS-CoV-2 by FDA under an Emergency Use Authorization (EUA). This EUA will remain  in effect (meaning this test can be used) for the duration of the COVID-19 declaration under Section 564(b)(1) of the Act, 21 U.S.C. section 360bbb-3(b)(1), unless the authorization is terminated or revoked sooner.     Influenza Ali Mohl by PCR NEGATIVE NEGATIVE Final   Influenza B by PCR NEGATIVE NEGATIVE Final    Comment: (NOTE) The Xpert Xpress SARS-CoV-2/FLU/RSV assay is intended as an aid in  the diagnosis of influenza from Nasopharyngeal swab specimens and  should not be used as Alannah Averhart sole basis for treatment. Nasal washings and  aspirates are unacceptable for Xpert Xpress SARS-CoV-2/FLU/RSV  testing.  Fact Sheet for Patients: PinkCheek.be  Fact Sheet for Healthcare Providers: GravelBags.it  This test is not yet approved or cleared by the [place] FDA and  has been authorized for detection and/or diagnosis of SARS-CoV-2 by  FDA under an Emergency Use Authorization (EUA). This EUA will remain  in effect (meaning this test can be used) for the duration of the  Covid-19 declaration under Section 564(b)(1) of the Act, 21  U.S.C. section 360bbb-3(b)(1), unless the authorization is  terminated or revoked. Performed at [facility], [address] [place], [place] [address]   MRSA PCR Screening     Status: None   Collection Time: 04/12/20  6:14 AM   Specimen: Nasopharyngeal  Result Value Ref Range Status   MRSA by PCR NEGATIVE NEGATIVE Final     Comment:        The GeneXpert MRSA Assay (FDA approved for NASAL specimens only), is one component of [place] comprehensive MRSA colonization surveillance program. It is not intended to diagnose MRSA infection nor to guide or monitor treatment for MRSA infections. Performed at [facility], [address], [place], [place] [address]          Radiology Studies: CT ABDOMEN PELVIS W CONTRAST  Result Date: 04/12/2020 CLINICAL DATA:  Abdominal pain, unresponsive and nonverbal EXAM: CT ABDOMEN AND PELVIS WITH CONTRAST TECHNIQUE: Multidetector CT imaging of the abdomen and pelvis was performed using the standard protocol following bolus administration of intravenous contrast. CONTRAST:  157mL OMNIPAQUE IOHEXOL 300 MG/ML  SOLN COMPARISON:  Same day chest radiograph, CT pelvis 07/26/2019 FINDINGS: Lower chest: Evaluation of the lungs complicated by extensive respiratory motion artifact. Some patchy ground-glass opacity could reflect atelectatic changes or less likely infection. Normal heart size. No pericardial effusion. Few coronary artery calcifications. Hepatobiliary: Mild heterogeneity of the hepatic attenuation with Shanoah Asbill slightly nodular liver surface contour. No focal liver lesion. Moderate distention of the gallbladder. No significant gallbladder wall thickening. Pericholecystic fluid is likely redistributed from the larger volume hemoperitoneum throughout the abdomen. No visible calcified gallstones or biliary dilatation. Pancreas: No pancreatic ductal dilatation or surrounding inflammatory changes. Spleen: Partial compression of the spleen by Elin Seats large subcapsular hematoma measuring approximately 8 x 6.2 x 11.7 cm  in size with Alaysha Jefcoat blush of active contrast extravasation arising from the superior spleen and extending into the peritoneum with sentinel clot sign throughout the left upper quadrant. Adrenals/Urinary Tract: 1 cm intermediate attenuation nodule in left adrenal gland. Kidneys are normally  located with symmetric enhancement and excretion. No suspicious renal lesion, urolithiasis or hydronephrosis. Some mild circumferential thickening of the bladder is present. There is Ranette Luckadoo left bladder ear deviating towards Bethsaida Siegenthaler left inguinal hernia which contains some fat hemoperitoneum as well. Stomach/Bowel: Sliding-type hiatal hernia. Stomach is unremarkable. Mild wall thickening of the small bowel including the duodenum is nonspecific given the moderate volume hemoperitoneum. Some additional nonspecific colonic mural thickening the distal transverse and splenic flexure as well. Vascular/Lymphatic: Atherosclerotic calcifications within the abdominal aorta and branch vessels. No aneurysm or ectasia. Active contrast extravasation arising from the upper pole spleen. Few prominent, likely reactive nodes present in the inguinal region. No other conspicuous enlarged adenopathy in the abdomen or pelvis. Reproductive: Prostatomegaly with indentation of the bladder base. Few coarse eccentric calcifications of the prostate and seminal vesicles, often senescent change. Some mild corporal calcifications are noted as well. Other: Moderate volume hemoperitoneum with sentinel clot in the left upper quadrant and active contrast extravasation arising from the superior pole of the spleen extending into the peritoneum proper. Bilateral fat containing inguinal hernias with small amount of fluid and partial herniation of the left anterolateral bladder into the left inguinal hernia sac. No bowel containing hernias. Focal skin thickening and subcutaneous fat stranding along the left inguinal crease with some adjacent reactive adenopathy. Additional skin thickening and subcutaneous fat stranding along the gluteal cleft, right greater than left with some mild thickening and inflammation which extends to the levator plate and intersphincteric plane. No clear abscess. Kandis Henry fistulization is difficult to exclude on CT imaging. Inflammatory changes  extend below the margin of imaging within abscess in this vicinity seen on comparison pelvic CT. Additional enlarging subcutaneous soft tissue nodule along the medial right gluteal soft tissues superficially (2/86) measuring up to 1.8 by 0.9 cm, previously 0.8 x 0.4 cm. While this could reflect Davaughn Hillyard dermal inclusion cyst, should correlate with visual inspection. Musculoskeletal: No acute osseous abnormality or suspicious osseous lesion. Degenerative changes in the spine, hips and pelvis. IMPRESSION: 1. Active contrast extravasation arising from the superior pole/dome of the spleen contributing both to Haylie Mccutcheon large subcapsular splenic hematoma and moderate volume hemoperitoneum with sentinel clot in the left upper quadrant. 2. Slightly heterogeneous and nodular liver, could correlate for features of intrinsic liver disease/cirrhosis. Consider correlation with LFTs. 3. Skin thickening and subcutaneous fat stranding along the left inguinal crease concerning for Natori Gudino cellulitis with some adjacent reactive adenopathy. 4. More extensive skin thickening and subcutaneous phlegmon without organized abscess along the gluteal cleft, right greater than left with extension to the intersphincteric plane. No clear abscess though inflammatory changes extend below the margin of imaging. Furthermore, fistulization is difficult to exclude on CT imaging. Recommend correlation with visual inspection. 5. Small though slightly enlarging collection contiguous with the dermis along the more posterior right gluteal soft tissues. Could reflect Itay Mella small dermal inclusion cysts though should correlate with visual inspection. 6. Bilateral fat containing inguinal hernias with small amount of fluid and partial herniation of the left anterolateral bladder into the left inguinal hernia sac. No bowel containing hernias. 7. 1 cm intermediate attenuation nodule in the left adrenal gland, incompletely characterized on this exam. Likely Ledon Weihe benign adrenal adenoma  though should consider with 1 year follow-up adrenal washout  CT. This recommendation follows ACR consensus guidelines: Management of Incidental Adrenal Masses: Averill Winters White Paper of the ACR Incidental Findings Brooks. J Am Coll Radiol 2017;14:1038-1044. 8. Some patchy ground-glass opacity in the right lung base greater than left, possibly atelectasis versus atypical infection, difficult to delineate given extensive respiratory motion. 9. Aortic Atherosclerosis (ICD10-I70.0). Critical Value/emergent results were called by telephone at the time of interpretation on 04/12/2020 at 2:03 am to provider [facility] , who verbally acknowledged these results. Electronically Signed   By: Lovena Le M.D.   On: 04/12/2020 02:03   PERIPHERAL VASCULAR CATHETERIZATION  Result Date: 04/12/2020 See op note  DG Chest Portable 1 View  Result Date: 04/12/2020 CLINICAL DATA:  Sepsis EXAM: PORTABLE CHEST 1 VIEW COMPARISON:  09/13/2007 FINDINGS: Interstitial prominence within the lungs, most pronounced in the lower lobes. Heart is normal size. No effusions. No acute bony abnormality. IMPRESSION: Interstitial prominence predominantly in the lower lobes. This could reflect atypical infection. Electronically Signed   By: Rolm Baptise M.D.   On: 04/12/2020 00:36        Scheduled Meds: . sodium chloride   Intravenous Once  . Chlorhexidine Gluconate Cloth  6 each Topical Daily  . gabapentin  300 mg Oral QHS  . insulin aspart  0-9 Units Subcutaneous Q4H  . LORazepam  0-4 mg Intravenous Q6H   Followed by  . [START ON 04/14/2020] LORazepam  0-4 mg Intravenous Q12H  . nicotine  21 mg Transdermal Daily  . tamsulosin  0.4 mg Oral QPC supper   Continuous Infusions: . sodium chloride 75 mL/hr (04/13/20 1041)  . cefTRIAXone (ROCEPHIN)  IV Stopped (04/12/20 2210)  . doxycycline (VIBRAMYCIN) IV    . metronidazole Stopped (04/13/20 0716)     LOS: 1 day    Time spent: over 30 min    Fayrene Helper, MD [facility]   To contact the attending provider between 7A-7P or the covering provider during after hours 7P-7A, please log into the web site [email or web address] and access using universal [place] password for that web site. If you do not have the password, please call the hospital operator.  04/13/2020, 11:28 AM

## 2020-04-13 NOTE — Progress Notes (Signed)
CC: Splenic bleed Subjective: Patient is still complaining of abdominal pain.  He knows where he is but he is a bit somnolent.  Hemodynamics have been adequate but his hemoglobin has been trending now down to 7.3.  He has been resuscitated appropriately  Objective: Vital signs in last 24 hours: Temp:  [96.9 F (36.1 C)-98.8 F (37.1 C)] 98.6 F (37 C) (11/06 0757) Pulse Rate:  [80-105] 90 (11/06 1100) Resp:  [13-26] 21 (11/06 1100) BP: (106-155)/(64-108) 130/87 (11/06 0757) SpO2:  [86 %-100 %] 94 % (11/06 1100) Last BM Date: 04/12/20  Intake/Output from previous day: 11/05 0701 - 11/06 0700 In: 4125.8 [I.V.:3282.5; IV Piggyback:843.3] Out: 1100 [Urine:1100] Intake/Output this shift: Total I/O In: 178.3 [I.V.:149.9; IV Piggyback:28.4] Out: 75 [Urine:75]  Physical exam:  Easily arousable but somnolent, follows commands Abd: soft but Tender on LUQ, distended, no overt peritonitis but not benign either.  Lab Results: CBC  Recent Labs    04/12/20 0930 04/12/20 1612 04/13/20 0425 04/13/20 1212  WBC 19.9*  --  18.9*  --   HGB 9.9*   < > 7.8* 7.3*  HCT 30.7*   < > 24.1* 22.0*  PLT 217  --  211  --    < > = values in this interval not displayed.   BMET Recent Labs    04/12/20 0930 04/13/20 0425  NA 138 138  K 3.8 4.5  CL 104 106  CO2 23 24  GLUCOSE 204* 121*  BUN 18 20  CREATININE 1.45* 1.35*  CALCIUM 7.4* 7.9*   PT/INR Recent Labs    04/12/20 0207 04/13/20 0425  LABPROT 14.9 13.4  INR 1.2 1.1   ABG No results for input(s): PHART, HCO3 in the last 72 hours.  Invalid input(s): PCO2, PO2  Studies/Results: CT ABDOMEN PELVIS W CONTRAST  Result Date: 04/12/2020 CLINICAL DATA:  Abdominal pain, unresponsive and nonverbal EXAM: CT ABDOMEN AND PELVIS WITH CONTRAST TECHNIQUE: Multidetector CT imaging of the abdomen and pelvis was performed using the standard protocol following bolus administration of intravenous contrast. CONTRAST:  128mL OMNIPAQUE IOHEXOL 300  MG/ML  SOLN COMPARISON:  Same day chest radiograph, CT pelvis 07/26/2019 FINDINGS: Lower chest: Evaluation of the lungs complicated by extensive respiratory motion artifact. Some patchy ground-glass opacity could reflect atelectatic changes or less likely infection. Normal heart size. No pericardial effusion. Few coronary artery calcifications. Hepatobiliary: Mild heterogeneity of the hepatic attenuation with a slightly nodular liver surface contour. No focal liver lesion. Moderate distention of the gallbladder. No significant gallbladder wall thickening. Pericholecystic fluid is likely redistributed from the larger volume hemoperitoneum throughout the abdomen. No visible calcified gallstones or biliary dilatation. Pancreas: No pancreatic ductal dilatation or surrounding inflammatory changes. Spleen: Partial compression of the spleen by a large subcapsular hematoma measuring approximately 8 x 6.2 x 11.7 cm in size with a blush of active contrast extravasation arising from the superior spleen and extending into the peritoneum with sentinel clot sign throughout the left upper quadrant. Adrenals/Urinary Tract: 1 cm intermediate attenuation nodule in left adrenal gland. Kidneys are normally located with symmetric enhancement and excretion. No suspicious renal lesion, urolithiasis or hydronephrosis. Some mild circumferential thickening of the bladder is present. There is a left bladder ear deviating towards a left inguinal hernia which contains some fat hemoperitoneum as well. Stomach/Bowel: Sliding-type hiatal hernia. Stomach is unremarkable. Mild wall thickening of the small bowel including the duodenum is nonspecific given the moderate volume hemoperitoneum. Some additional nonspecific colonic mural thickening the distal transverse and splenic flexure as  well. Vascular/Lymphatic: Atherosclerotic calcifications within the abdominal aorta and branch vessels. No aneurysm or ectasia. Active contrast extravasation arising  from the upper pole spleen. Few prominent, likely reactive nodes present in the inguinal region. No other conspicuous enlarged adenopathy in the abdomen or pelvis. Reproductive: Prostatomegaly with indentation of the bladder base. Few coarse eccentric calcifications of the prostate and seminal vesicles, often senescent change. Some mild corporal calcifications are noted as well. Other: Moderate volume hemoperitoneum with sentinel clot in the left upper quadrant and active contrast extravasation arising from the superior pole of the spleen extending into the peritoneum proper. Bilateral fat containing inguinal hernias with small amount of fluid and partial herniation of the left anterolateral bladder into the left inguinal hernia sac. No bowel containing hernias. Focal skin thickening and subcutaneous fat stranding along the left inguinal crease with some adjacent reactive adenopathy. Additional skin thickening and subcutaneous fat stranding along the gluteal cleft, right greater than left with some mild thickening and inflammation which extends to the levator plate and intersphincteric plane. No clear abscess. A fistulization is difficult to exclude on CT imaging. Inflammatory changes extend below the margin of imaging within abscess in this vicinity seen on comparison pelvic CT. Additional enlarging subcutaneous soft tissue nodule along the medial right gluteal soft tissues superficially (2/86) measuring up to 1.8 by 0.9 cm, previously 0.8 x 0.4 cm. While this could reflect a dermal inclusion cyst, should correlate with visual inspection. Musculoskeletal: No acute osseous abnormality or suspicious osseous lesion. Degenerative changes in the spine, hips and pelvis. IMPRESSION: 1. Active contrast extravasation arising from the superior pole/dome of the spleen contributing both to a large subcapsular splenic hematoma and moderate volume hemoperitoneum with sentinel clot in the left upper quadrant. 2. Slightly  heterogeneous and nodular liver, could correlate for features of intrinsic liver disease/cirrhosis. Consider correlation with LFTs. 3. Skin thickening and subcutaneous fat stranding along the left inguinal crease concerning for a cellulitis with some adjacent reactive adenopathy. 4. More extensive skin thickening and subcutaneous phlegmon without organized abscess along the gluteal cleft, right greater than left with extension to the intersphincteric plane. No clear abscess though inflammatory changes extend below the margin of imaging. Furthermore, fistulization is difficult to exclude on CT imaging. Recommend correlation with visual inspection. 5. Small though slightly enlarging collection contiguous with the dermis along the more posterior right gluteal soft tissues. Could reflect a small dermal inclusion cysts though should correlate with visual inspection. 6. Bilateral fat containing inguinal hernias with small amount of fluid and partial herniation of the left anterolateral bladder into the left inguinal hernia sac. No bowel containing hernias. 7. 1 cm intermediate attenuation nodule in the left adrenal gland, incompletely characterized on this exam. Likely a benign adrenal adenoma though should consider with 1 year follow-up adrenal washout CT. This recommendation follows ACR consensus guidelines: Management of Incidental Adrenal Masses: A White Paper of the ACR Incidental Findings Committee. J Am Coll Radiol 2017;14:1038-1044. 8. Some patchy ground-glass opacity in the right lung base greater than left, possibly atelectasis versus atypical infection, difficult to delineate given extensive respiratory motion. 9. Aortic Atherosclerosis (ICD10-I70.0). Critical Value/emergent results were called by telephone at the time of interpretation on 04/12/2020 at 2:03 am to provider [facility] , who verbally acknowledged these results. Electronically Signed   By: Lovena Le M.D.   On: 04/12/2020 02:03    PERIPHERAL VASCULAR CATHETERIZATION  Result Date: 04/12/2020 See op note  DG Chest Port 1 View  Result Date: 04/13/2020 CLINICAL DATA:  Abnormal chest radiograph. Current smoker. Hypotension. Inpatient. EXAM: PORTABLE CHEST 1 VIEW COMPARISON:  Chest radiograph from one day prior. FINDINGS: Low lung volumes. Stable cardiomediastinal silhouette with normal heart size. No pneumothorax. No pleural effusion. Increased patchy and streaky bibasilar lung opacities. No pulmonary edema. IMPRESSION: Low lung volumes. Increased patchy and streaky bibasilar lung opacities, favor increased atelectasis, difficult to exclude a component of pneumonia or aspiration. Electronically Signed   By: Ilona Sorrel M.D.   On: 04/13/2020 12:33   DG Chest Portable 1 View  Result Date: 04/12/2020 CLINICAL DATA:  Sepsis EXAM: PORTABLE CHEST 1 VIEW COMPARISON:  09/13/2007 FINDINGS: Interstitial prominence within the lungs, most pronounced in the lower lobes. Heart is normal size. No effusions. No acute bony abnormality. IMPRESSION: Interstitial prominence predominantly in the lower lobes. This could reflect atypical infection. Electronically Signed   By: Rolm Baptise M.D.   On: 04/12/2020 00:36    Anti-infectives: Anti-infectives (From admission, onward)   Start     Dose/Rate Route Frequency Ordered Stop   04/13/20 1200  doxycycline (VIBRAMYCIN) 100 mg in sodium chloride 0.9 % 250 mL IVPB        100 mg 125 mL/hr over 120 Minutes Intravenous Every 12 hours 04/13/20 1112     04/12/20 2200  cefTRIAXone (ROCEPHIN) 2 g in sodium chloride 0.9 % 100 mL IVPB        2 g 200 mL/hr over 30 Minutes Intravenous Every 24 hours 04/12/20 1530     04/12/20 2200  metroNIDAZOLE (FLAGYL) IVPB 500 mg        500 mg 100 mL/hr over 60 Minutes Intravenous Every 8 hours 04/12/20 1530     04/12/20 1300  vancomycin (VANCOREADY) IVPB 750 mg/150 mL  Status:  Discontinued        750 mg 150 mL/hr over 60 Minutes Intravenous Every 12 hours 04/12/20  0544 04/13/20 1112   04/12/20 0600  meropenem (MERREM) 1 g in sodium chloride 0.9 % 100 mL IVPB  Status:  Discontinued        1 g 200 mL/hr over 30 Minutes Intravenous Every 8 hours 04/12/20 0526 04/12/20 1530   04/12/20 0545  vancomycin (VANCOREADY) IVPB 750 mg/150 mL        750 mg 150 mL/hr over 60 Minutes Intravenous  Once 04/12/20 0537 04/12/20 0949   04/12/20 0530  vancomycin (VANCOCIN) IVPB 1000 mg/200 mL premix  Status:  Discontinued        1,000 mg 200 mL/hr over 60 Minutes Intravenous  Once 04/12/20 0526 04/12/20 0650   04/12/20 0524  ceFAZolin (ANCEF) IVPB 2g/100 mL premix        2 g 200 mL/hr over 30 Minutes Intravenous 30 min pre-op 04/12/20 0524 04/12/20 0554   04/12/20 0030  piperacillin-tazobactam (ZOSYN) IVPB 3.375 g        3.375 g 100 mL/hr over 30 Minutes Intravenous  Once 04/12/20 0021 04/12/20 0213   04/12/20 0030  vancomycin (VANCOCIN) IVPB 1000 mg/200 mL premix        1,000 mg 200 mL/hr over 60 Minutes Intravenous  Once 04/12/20 0021 04/12/20 0236      Assessment/Plan:  Splenic bleed  ? Trauma vs spontaneous , status post embolization.  Patient persistent abdominal pain and hemoglobin trending down.  I am very concerned that he is failing minimally invasive therapy.  I will go ahead and do a stat repeat CT scan and if there is worsening hemoperitoneum we will have to perform an emergent splenectomy. Already type and  cross him for 6 units of blood and I am going to give him 2 units of red blood cells to be ahead. Please note that I spent at least 45 minutes in this encounter with greater than 50% spent in coordination counseling of his care  Caroleen Hamman, MD, [facility]  04/13/2020

## 2020-04-13 NOTE — Progress Notes (Signed)
Pt handed off to OR team at 1503.  4 units of PRBCs sent in cooler with patient.

## 2020-04-13 NOTE — Progress Notes (Signed)
Given 02/2020

## 2020-04-13 NOTE — Transfer of Care (Signed)
Immediate Anesthesia Transfer of Care Note  Patient: Caleb Brooks  Procedure(s) Performed: EXPLORATORY LAPAROTOMY (N/A )  Patient Location: ICU  Anesthesia Type:General  Level of Consciousness: sedated and Patient remains intubated per anesthesia plan  Airway & Oxygen Therapy: Patient remains intubated per anesthesia plan and Patient placed on Ventilator (see vital sign flow sheet for setting)  Post-op Assessment: Report given to RN and Post -op Vital signs reviewed and stable  Post vital signs: Reviewed and stable  Last Vitals:  Vitals Value Taken Time  BP 101/77 04/13/20 1720  Temp 37.2   Pulse 83 04/13/20 1722  Resp 11 04/13/20 1722  SpO2 100 % 04/13/20 1722  Vitals shown include unvalidated device data.  Last Pain:  Vitals:   04/13/20 1400  TempSrc: Oral  PainSc: 0-No pain         Complications: No complications documented.

## 2020-04-14 ENCOUNTER — Encounter: Payer: Self-pay | Admitting: Family Medicine

## 2020-04-14 ENCOUNTER — Inpatient Hospital Stay (HOSPITAL_COMMUNITY)
Admit: 2020-04-14 | Discharge: 2020-04-14 | Disposition: A | Discharge status: Home / Self Care | Payer: Medicaid Other | Attending: Surgery | Admitting: Surgery

## 2020-04-14 DIAGNOSIS — I471 Supraventricular tachycardia: Secondary | ICD-10-CM

## 2020-04-14 DIAGNOSIS — J9600 Acute respiratory failure, unspecified whether with hypoxia or hypercapnia: Secondary | ICD-10-CM

## 2020-04-14 LAB — GLUCOSE, CAPILLARY
Glucose-Capillary: 119 mg/dL — ABNORMAL HIGH (ref 70–99)
Glucose-Capillary: 114 mg/dL — ABNORMAL HIGH (ref 70–99)
Glucose-Capillary: 100 mg/dL — ABNORMAL HIGH (ref 70–99)
Glucose-Capillary: 97 mg/dL (ref 70–99)
Glucose-Capillary: 96 mg/dL (ref 70–99)
Glucose-Capillary: 86 mg/dL (ref 70–99)

## 2020-04-14 LAB — COMPREHENSIVE METABOLIC PANEL
ALT: 13 U/L (ref 0–44)
AST: 36 U/L (ref 15–41)
Albumin: 2.2 g/dL — ABNORMAL LOW (ref 3.5–5.0)
Alkaline Phosphatase: 64 U/L (ref 38–126)
Anion gap: 5 (ref 5–15)
BUN: 11 mg/dL (ref 6–20)
CO2: 24 mmol/L (ref 22–32)
Calcium: 7.2 mg/dL — ABNORMAL LOW (ref 8.9–10.3)
Chloride: 110 mmol/L (ref 98–111)
Creatinine, Ser: 0.81 mg/dL (ref 0.61–1.24)
GFR, Estimated: >60 mL/min (ref >60)
Glucose, Bld: 136 mg/dL — ABNORMAL HIGH (ref 70–99)
Potassium: 4.2 mmol/L (ref 3.5–5.1)
Sodium: 139 mmol/L (ref 135–145)
Total Bilirubin: 0.7 mg/dL (ref 0.3–1.2)
Total Protein: 5.2 g/dL — ABNORMAL LOW (ref 6.5–8.1)

## 2020-04-14 LAB — CBC WITH DIFFERENTIAL/PLATELET
Abs Immature Granulocytes: 0.16 10*3/uL — ABNORMAL HIGH (ref 0.00–0.07)
Basophils Absolute: 0 10*3/uL (ref 0.0–0.1)
Basophils Relative: 0 %
Eosinophils Absolute: 0 10*3/uL (ref 0.0–0.5)
Eosinophils Relative: 0 %
HCT: 31.8 % — ABNORMAL LOW (ref 39.0–52.0)
Hemoglobin: 10.7 g/dL — ABNORMAL LOW (ref 13.0–17.0)
Immature Granulocytes: 1 %
Lymphocytes Relative: 3 %
Lymphs Abs: 0.6 10*3/uL — ABNORMAL LOW (ref 0.7–4.0)
MCH: 30.6 pg (ref 26.0–34.0)
MCHC: 33.6 g/dL (ref 30.0–36.0)
MCV: 90.9 fL (ref 80.0–100.0)
Monocytes Absolute: 1.5 10*3/uL — ABNORMAL HIGH (ref 0.1–1.0)
Monocytes Relative: 7 %
Neutro Abs: 18.4 10*3/uL — ABNORMAL HIGH (ref 1.7–7.7)
Neutrophils Relative %: 89 %
Platelets: 169 10*3/uL (ref 150–400)
RBC: 3.5 MIL/uL — ABNORMAL LOW (ref 4.22–5.81)
RDW: 16.4 % — ABNORMAL HIGH (ref 11.5–15.5)
WBC: 20.6 10*3/uL — ABNORMAL HIGH (ref 4.0–10.5)
nRBC: 0.6 % — ABNORMAL HIGH (ref 0.0–0.2)

## 2020-04-14 LAB — CBC
HCT: 32.2 % — ABNORMAL LOW (ref 39.0–52.0)
Hemoglobin: 11.1 g/dL — ABNORMAL LOW (ref 13.0–17.0)
MCH: 31.1 pg (ref 26.0–34.0)
MCHC: 34.5 g/dL (ref 30.0–36.0)
MCV: 90.2 fL (ref 80.0–100.0)
Platelets: 163 10*3/uL (ref 150–400)
RBC: 3.57 MIL/uL — ABNORMAL LOW (ref 4.22–5.81)
RDW: 16.2 % — ABNORMAL HIGH (ref 11.5–15.5)
WBC: 23.6 10*3/uL — ABNORMAL HIGH (ref 4.0–10.5)
nRBC: 0.3 % — ABNORMAL HIGH (ref 0.0–0.2)

## 2020-04-14 LAB — PREPARE RBC (CROSSMATCH)

## 2020-04-14 LAB — ECHOCARDIOGRAM COMPLETE
AR max vel: 2.53 cm2
AV Area VTI: 2.23 cm2
AV Area mean vel: 2.39 cm2
AV Mean grad: 6 mmHg
AV Peak grad: 11.7 mmHg
Ao pk vel: 1.71 m/s
Area-P 1/2: 3.55 cm2
Height: 71 in
S' Lateral: 2.87 cm
Weight: 2694.9 oz

## 2020-04-14 LAB — PHOSPHORUS: Phosphorus: 3 mg/dL (ref 2.5–4.6)

## 2020-04-14 LAB — MAGNESIUM: Magnesium: 1.9 mg/dL (ref 1.7–2.4)

## 2020-04-14 MED ORDER — MAGNESIUM SULFATE 2 GM/50ML IV SOLN
2.0000 g | Freq: Once | INTRAVENOUS | Status: AC
  Administered 2020-04-14: 2 g via INTRAVENOUS
  Filled 2020-04-14: qty 50

## 2020-04-14 MED ORDER — FENTANYL CITRATE (PF) 100 MCG/2ML IJ SOLN
25.0000 ug | INTRAMUSCULAR | Status: DC | PRN
  Administered 2020-04-14 (×2): 25 ug via INTRAVENOUS
  Administered 2020-04-14: 75 ug via INTRAVENOUS
  Administered 2020-04-14 – 2020-04-15 (×2): 50 ug via INTRAVENOUS
  Administered 2020-04-15: 100 ug via INTRAVENOUS
  Administered 2020-04-15 (×2): 50 ug via INTRAVENOUS
  Administered 2020-04-15: 75 ug via INTRAVENOUS
  Administered 2020-04-15 – 2020-04-16 (×5): 100 ug via INTRAVENOUS
  Filled 2020-04-14 (×14): qty 2

## 2020-04-14 NOTE — Progress Notes (Signed)
OT Cancellation Note  Patient Details Name: Caleb Brooks MRN: [id number] DOB: 1960/11/08   Cancelled Treatment:    Reason Eval/Treat Not Completed: Patient not medically ready. OT order received and chart reviewed. Per MD note pt meets criteria for extubation and SBT this date. Will follow and initiate services as pt appropriate for exertional activity.   Dessie Coma, M.S. OTR/L  04/14/20, 10:02 AM  ascom [phone]

## 2020-04-14 NOTE — Progress Notes (Signed)
PT Cancellation Note  Patient Details Name: Caleb Brooks MRN: [id number] DOB: [birth year]   Cancelled Treatment:    Reason Eval/Treat Not Completed: Other (comment). Consult received and chart reviewed. Pt currently intubated and sedated as of this AM. Unsure if currently extubated at this time now. Currently not appropriate for exertional activity. Will re-attempt next date, if medically stable.   Felice Deem 04/14/2020, 9:58 AM Greggory Stallion, PT, DPT [phone]

## 2020-04-14 NOTE — Progress Notes (Addendum)
NAME:  Caleb Brooks, MRN:  [id number], DOB:  02/09/1961, LOS: 2 ADMISSION DATE:  04/12/2020, CONSULTATION DATE:  04/13/20 REFERRING MD:  DR Diego Pabon and Dr Melven Sartorius, CHIEF COMPLAINT:  Post splenectomy resp failure   Brief History   Post splenectomy.  Respiratory failure with intraperitoneal bleed  History of present illness -to the hospitalist on 04/12/2020  Caleb Brooks  is a 59 y.o. African-American male with a known history of hypertension, hidradenitis suppurativa complicated by recurrent boils and keloid formation anxiety and osteoarthritis as well as EtOH and tobacco abuse, presented to the emergency room with acute onset of syncope twice with associated lightheadedness and dizziness.  The patient was noted to be hypotensive with a BP of 54/30 by EMS.  He has been having purulent drainage from his perineal boils for a few days with associated swelling and severe pain that has been constant.  He admits to left upper quadrant abdominal pain after having a fall on his side.  No nausea or vomiting or melena or bright red bleeding per rectum.  No dyspnea or cough or wheezing or hemoptysis.  No other bleeding diathesis.  No chest pain or palpitations.  Upon presentation to the emergency room, blood pressure was initially 130/82 with a heart rate of 81 and respiratory to 27 and later respiratory it was up to 43 and blood pressure dropped to 87/64 with respiratory rate of 22-30.  BP later on improved with hydration to 122/92 with normal map.  He was afebrile and has been maintaining pulse ox symmetry of 97 to 99% on room air.  Labs revealed a blood glucose of 197 and a BUN of 13 with a creatinine 1.59 compared to 14 and 1.15 on 03/14/2020.  Albumin was 3 and lactic acid 5.4 with procalcitonin 0.28.  CBC showed leukocytosis of 15.9 and anemia with hemoglobin of 10.9 hematocrit of 33.5 compared to 15.4 and 47.2 a month ago.  Platelets were 292.  Alcohol levels less than 10.  Portable chest x-ray  showed interstitial prominence predominantly in the lower lobes that may represent atypical infection.  Abdominal and pelvic CT scan showed the following: 1. Active contrast extravasation arising from the superior pole/dome of the spleen contributing both to a large subcapsular splenic hematoma and moderate volume hemoperitoneum with sentinel clot in the left upper quadrant. 2. Slightly heterogeneous and nodular liver, could correlate for features of intrinsic liver disease/cirrhosis. Consider correlation with LFTs. 3. Skin thickening and subcutaneous fat stranding along the left inguinal crease concerning for a cellulitis with some adjacent reactive adenopathy. 4. More extensive skin thickening and subcutaneous phlegmon without organized abscess along the gluteal cleft, right greater than left with extension to the intersphincteric plane. No clear abscess though inflammatory changes extend below the margin of imaging. Furthermore, fistulization is difficult to exclude on CT imaging. Recommend correlation with visual inspection. 5. Small though slightly enlarging collection contiguous with the dermis along the more posterior right gluteal soft tissues. Could reflect a small dermal inclusion cysts though should correlate with visual inspection. 6. Bilateral fat containing inguinal hernias with small amount of fluid and partial herniation of the left anterolateral bladder into the left inguinal hernia sac. No bowel containing hernias. 7. 1 cm intermediate attenuation nodule in the left adrenal gland, incompletely characterized on this exam. Likely a benign adrenal adenoma though should consider with 1 year follow-up adrenal washout CT. This recommendation follows ACR consensus guidelines: Management of Incidental Adrenal Masses: A White Paper of the ACR  Incidental Findings Committee. J Am Coll Radiol 2017;14:1038-1044. 8. Some patchy ground-glass opacity in the right lung base  greater than left, possibly atelectasis versus atypical infection, difficult to delineate given extensive respiratory motion. 9. Aortic Atherosclerosis  The patient was given IV vancomycin and Zosyn, 2 L bolus of IV lactated Ringer and 3 units of packed red blood cells as well as 1 g of IV tranexamic acid.  Dr. Lysle Pearl was notified about the patient and recommended vascular surgery consult.  Dr. Lucky Cowboy was notified but the patient and is taking him to the OR for embolectomy.  I was asked by the ER physician to admit the patient per Dr. Lysle Pearl and Dr. Bunnie Domino recommendation.  The patient will be admitted to progressive unit bed for further evaluation and management.  Urine tox at admission 04/12/2020: Positive for cocaine and marijuana    CCM consultation history 04/13/2020   59 year old smoker with urine tox positive for cocaine, marijuana/he is status post fall.  Had left upper quadrant pain.  Had syncope.  Noted to have splenic bleed.  Is status post coil embolization of the splenic artery 04/12/2020 and status post 3 unit PRBC.  However on 04/13/2020 CT scan of the chest showed active contrast extravasation around the dome of the spleen.  Seen by surgery Dr. 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  Return to the intensive care unit on ventilator and CCM assuming primary care.  Upon return to the ICU still well sedated with effects of anesthesia but hypotensive despite propofol infusion.  On 40% FiO2 on the ventilator    has a past medical history of Anxiety, Arthritis, and Hypertension.   has a past surgical history that includes Cyst removal trunk; Incision and drainage abscess (N/A, 07/26/2019); Incision and drainage abscess (N/A, 07/26/2019); and EMBOLIZATION (N/A,  04/12/2020).   Consults:  04/12/2020 - vascular an surgeyr 04/13/20 - CCM  Procedures:  11/5 - EMbolization splenic art 11/6 - p lap and splenectomy  Significant Diagnostic Tests:  x  Micro Data:   Recent Results (from the past 240 hour(s))  Blood culture (routine x 2)     Status: None (Preliminary result)   Collection Time: 04/12/20  2:07 AM   Specimen: BLOOD  Result Value Ref Range Status   Specimen Description BLOOD LINE RIGHT HAND  Final   Special Requests   Final    BOTTLES DRAWN AEROBIC AND ANAEROBIC Blood Culture results may not be optimal due to an inadequate volume of blood received in culture bottles   Culture   Final    NO GROWTH 1 DAY Performed at [facility], [address], [place], [place] [address]    Report Status PENDING  Incomplete  Blood culture (routine x 2)     Status: None (Preliminary result)   Collection Time: 04/12/20  2:07 AM   Specimen: BLOOD  Result Value Ref Range Status   Specimen Description BLOOD RIGHT HAND  Final   Special Requests   Final    BOTTLES DRAWN AEROBIC AND ANAEROBIC Blood Culture adequate volume   Culture   Final    NO GROWTH 1 DAY Performed at [facility], [address], [place], [place] [address]    Report Status PENDING  Incomplete  Respiratory Panel by RT PCR (Flu A&B,  Covid) - Nasopharyngeal Swab     Status: None   Collection Time: 04/12/20  2:07 AM   Specimen: Nasopharyngeal Swab  Result Value Ref Range Status   SARS Coronavirus 2 by RT PCR NEGATIVE NEGATIVE Final    Comment: (NOTE) SARS-CoV-2 target nucleic acids are NOT DETECTED.  The SARS-CoV-2 RNA is generally detectable in upper respiratoy specimens during the acute phase of infection. The lowest concentration of SARS-CoV-2 viral copies this assay can detect is 131 copies/mL. A negative result does not preclude SARS-Cov-2 infection and should not be used as the sole basis for treatment or other patient management decisions. A negative  result may occur with  improper specimen collection/handling, submission of specimen other than nasopharyngeal swab, presence of viral mutation(s) within the areas targeted by this assay, and inadequate number of viral copies (<131 copies/mL). A negative result must be combined with clinical observations, patient history, and epidemiological information. The expected result is Negative.  Fact Sheet for Patients:  PinkCheek.be  Fact Sheet for Healthcare Providers:  GravelBags.it  This test is no t yet approved or cleared by the [place] FDA and  has been authorized for detection and/or diagnosis of SARS-CoV-2 by FDA under an Emergency Use Authorization (EUA). This EUA will remain  in effect (meaning this test can be used) for the duration of the COVID-19 declaration under Section 564(b)(1) of the Act, 21 U.S.C. section 360bbb-3(b)(1), unless the authorization is terminated or revoked sooner.     Influenza A by PCR NEGATIVE NEGATIVE Final   Influenza B by PCR NEGATIVE NEGATIVE Final    Comment: (NOTE) The Xpert Xpress SARS-CoV-2/FLU/RSV assay is intended as an aid in  the diagnosis of influenza from Nasopharyngeal swab specimens and  should not be used as a sole basis for treatment. Nasal washings and  aspirates are unacceptable for Xpert Xpress SARS-CoV-2/FLU/RSV  testing.  Fact Sheet for Patients: PinkCheek.be  Fact Sheet for Healthcare Providers: GravelBags.it  This test is not yet approved or cleared by the [place] FDA and  has been authorized for detection and/or diagnosis of SARS-CoV-2 by  FDA under an Emergency Use Authorization (EUA). This EUA will remain  in effect (meaning this test can be used) for the duration of the  Covid-19 declaration under Section 564(b)(1) of the Act, 21  U.S.C. section 360bbb-3(b)(1), unless the authorization is    terminated or revoked. Performed at [facility], [address] [place], [place] [address]   MRSA PCR Screening     Status: None   Collection Time: 04/12/20  6:14 AM   Specimen: Nasopharyngeal  Result Value Ref Range Status   MRSA by PCR NEGATIVE NEGATIVE Final    Comment:        The GeneXpert MRSA Assay (FDA approved for NASAL specimens only), is one component of a comprehensive MRSA colonization surveillance program. It is not intended to diagnose MRSA infection nor to guide or monitor treatment for MRSA infections. Performed at [facility], [address] [place], [place] [address]      Antimicrobials:  Cefazolin 11/5 - 11/5 Vanc 11/5 - 11/6 Merrem 11/5 - 11/5  Ceftriaxone (cellulits, boils on the back per report) 04/12/20 Doxy (hidradenitis boils on the back per report) 11/6//21 Flagyn IV 11/5 (wound )        Significant Hospital Events   *04/12/2020 - admit  11/6 - plap  Interim history/subjective:    11/7 - meets extubation criteria with just low dose fent gtt and on SBT.  Per RN -> there is a report that he has boils on his back that is draining and that is why he is on ceftriaxone and doxycycline.  Objective   Blood pressure 110/80, pulse 70, temperature 97.8 F (36.6 C), temperature source Axillary, resp. rate (!) 6, height 5\' 11"  (1.803 m), weight 76.4 kg, SpO2 99 %.    Vent Mode: PRVC FiO2 (%):  [40 %] 40 % Set Rate:  [16 bmp] 16 bmp Vt Set:  [450 mL] 450 mL PEEP:  [5 cmH20] 5 cmH20 Plateau Pressure:  [15 cmH20-18 cmH20] 16 cmH20   Intake/Output Summary (Last 24 hours) at 04/14/2020 0834 Last data filed at 04/14/2020 9470 Gross per 24 hour  Intake 9433.6 ml  Output 4845 ml  Net 4588.6 ml   Filed Weights   04/12/20 0013 04/12/20 0620  Weight: 74.8 kg 76.4 kg    Examination: General Appearance:  Looks in pain Head:  Normocephalic, without obvious abnormality, atraumatic Eyes:  PERRL - yes, conjunctiva/corneas - no      Ears:  Normal external ear canals, both ears Nose:  G tube - no Throat:  ETT TUBE - yes , OG tube - yes Neck:  Supple,  No enlargement/tenderness/nodules Lungs: Clear to auscultation bilaterally, Ventilator   Synchrony - yes on PSVT Heart:  S1 and S2 normal, no murmur, CVP - no.  Pressors - no Abdomen:  Soft, no masses, no organomegaly. Ha incision and dressing Genitalia / Rectal:  Not done Extremities:  Extremities- intact Skin:  ntact in exposed areas . Sacral area - not examin4d Neurologic:  Sedation - fent gtt at 61mcg -> RASS - +2 . Moves all 4s - yes. CAM-ICU - neg . Orientation - x3+. In pain       LABS    PULMONARY Recent Labs  Lab 04/13/20 1749  PHART 7.32*  PCO2ART 42  PO2ART 68*  HCO3 21.6  O2SAT 91.6    CBC Recent Labs  Lab 04/13/20 1833 04/14/20 0021 04/14/20 0433  HGB 13.1 11.1* 10.7*  HCT 39.4 32.2* 31.8*  WBC 28.1* 23.6* 20.6*  PLT 159 163 169    COAGULATION Recent Labs  Lab 04/12/20 0207 04/13/20 0425  INR 1.2 1.1    CARDIAC  No results for input(s): TROPONINI in the last 168 hours. No results for input(s): PROBNP in the last 168 hours.   CHEMISTRY Recent Labs  Lab 04/12/20 0016 04/12/20 0016 04/12/20 0207 04/12/20 0930 04/12/20 0930 04/13/20 0425 04/13/20 0425 04/13/20 1833 04/14/20 0433  NA 139  --   --  138  --  138  --  139 139  K 3.7   < >  --  3.8   < > 4.5   < > 4.0 4.2  CL 103  --   --  104  --  106  --  109 110  CO2 23  --   --  23  --  24  --  20* 24  GLUCOSE 197*  --   --  204*  --  121*  --  111* 136*  BUN 13  --   --  18  --  20  --  13 11  CREATININE 1.59*  --   --  1.45*  --  1.35*  --  0.84 0.81  CALCIUM 8.7*  --   --  7.4*  --  7.9*  --  7.3* 7.2*  MG  --   --  1.7 1.5*  --   --   --  2.0 1.9  PHOS  --   --   --  4.4  --   --   --  2.7 3.0   < > = values in this interval not displayed.   Estimated Creatinine Clearance: 107.2 mL/min (by C-G formula based on SCr of 0.81 mg/dL).   LIVER Recent Labs   Lab 04/12/20 0016 04/12/20 0207 04/12/20 0930 04/13/20 0425 04/13/20 1833 04/14/20 0433  AST 16  --  22 18 35 36  ALT 10  --  10 10 13 13   ALKPHOS 107  --  78 76 74 64  BILITOT 0.7  --  1.2 0.6 1.3* 0.7  PROT 7.2  --  5.9* 6.0* 5.8* 5.2*  ALBUMIN 3.0*  --  2.5* 2.6* 2.5* 2.2*  INR  --  1.2  --  1.1  --   --      INFECTIOUS Recent Labs  Lab 04/12/20 0016 04/12/20 0207 04/12/20 1219 04/13/20 0425 04/13/20 1833 04/13/20 2157  LATICACIDVEN 5.4*   < > 2.6*  --  2.5* 0.9  PROCALCITON 0.28  --   --  0.61  --   --    < > = values in this interval not displayed.     ENDOCRINE CBG (last 3)  Recent Labs    04/13/20 2319 04/14/20 0305 04/14/20 0717  GLUCAP 128* 119* 114*         IMAGING x48h  - image(s) personally visualized  -   highlighted in bold DG Chest 1 View  Result Date: 04/13/2020 CLINICAL DATA:  Endotracheal and NG tube placement. EXAM: CHEST  1 VIEW COMPARISON:  Earlier today. FINDINGS: Endotracheal tube tip 7.4 cm from the carina. Enteric tube tip below the diaphragm in the stomach, side-port just at the gastroesophageal junction. Elevation of the right hemidiaphragm with probable right middle lobe collapse/atelectasis. Subsegmental atelectasis at the left lung base. The heart is enlarged. No pulmonary edema. No pneumothorax or significant pleural effusion. Embolization coils, midline laparotomy skin staples, and surgical drain in the left upper quadrant. IMPRESSION: 1. Endotracheal tube tip 7.4 cm from the carina. Enteric tube tip below the diaphragm in the stomach, side-port just at the gastroesophageal junction. 2. Elevation of the right hemidiaphragm with probable right middle lobe collapse/atelectasis. Electronically Signed   By: Keith Rake M.D.   On: 04/13/2020 18:31   DG Chest Port 1 View  Result Date: 04/13/2020 CLINICAL DATA:  Acute respiratory failure. EXAM: PORTABLE CHEST 1 VIEW COMPARISON:  Earlier today. FINDINGS: Endotracheal tube tip 2.4 cm  from the carina. Enteric tube has been advanced, the side port is now below the diaphragm. Persistent opacity at the right lung base favoring right middle or lower lobar collapse/atelectasis. Increasing retrocardiac opacity. Mild cardiomegaly with unchanged mediastinal contours. There may be developing small pleural effusions. No pneumothorax. IMPRESSION: 1. Endotracheal tube tip 2.4 cm from the carina. Enteric tube has been advanced, side port now below the diaphragm. 2. Persistent right lung base opacity favoring right lower lobar collapse/atelectasis. Increasing retrocardiac opacity. Electronically Signed   By: Keith Rake M.D.   On: 04/13/2020 22:24   DG Chest Port 1 View  Result Date: 04/13/2020 CLINICAL DATA:  Abnormal chest radiograph. Current smoker. Hypotension. Inpatient. EXAM: PORTABLE CHEST 1 VIEW COMPARISON:  Chest radiograph from one day prior. FINDINGS: Low lung volumes. Stable cardiomediastinal silhouette with normal heart size. No pneumothorax. No pleural effusion. Increased patchy and streaky bibasilar lung opacities. No pulmonary edema. IMPRESSION: Low lung volumes. Increased patchy and streaky bibasilar  lung opacities, favor increased atelectasis, difficult to exclude a component of pneumonia or aspiration. Electronically Signed   By: Ilona Sorrel M.D.   On: 04/13/2020 12:33   CT Angio Abd/Pel w/ and/or w/o  Result Date: 04/13/2020 CLINICAL DATA:  History splenic rupture with extravasation post percutaneous coil embolization with persistent instability. EXAM: CTA ABDOMEN AND PELVIS WITHOUT AND WITH CONTRAST TECHNIQUE: Multidetector CT imaging of the abdomen and pelvis was performed using the standard protocol during bolus administration of intravenous contrast. Multiplanar reconstructed images and MIPs were obtained and reviewed to evaluate the vascular anatomy. CONTRAST:  175mL OMNIPAQUE IOHEXOL 350 MG/ML SOLN COMPARISON:  CT abdomen pelvis-04/12/2020 FINDINGS: VASCULAR Aorta:  Scattered mixed calcified and noncalcified atherosclerotic plaque throughout the normal caliber abdominal aorta, not resulting in a hemodynamically significant stenosis. No evidence of abdominal aortic dissection or periaortic stranding. Celiac: Widely patent without hemodynamically significant narrowing. Conventional branching pattern. Post coil embolization of the presumed splenic artery, though evaluation degraded secondary to streak significant streak artifact associated with the embolization coils. SMA: Widely patent without hemodynamically significant narrowing though again, evaluation of the proximal aspect of the SMA is degraded secondary to streak artifact from the adjacent embolization coils. Renals: Solitary bilaterally; the bilateral renal arteries are widely patent without hemodynamically significant narrowing. No vessel irregularity to suggest FMD. IMA: Remains patent. Inflow: There is a minimal amount of calcified atherosclerotic plaque involving the bilateral common and internal iliac arteries, not resulting in hemodynamically significant stenosis. The bilateral external iliac arteries are tortuous though of normal caliber. Proximal Outflow: There is a minimal amount of mixed calcified and noncalcified atherosclerotic plaque involving the bilateral common femoral arteries, not resulting in hemodynamically significant stenosis. Radiopaque closure device material is seen anterior to the right common femoral artery. No associated hematoma. The imaged portions of the bilateral deep and superficial femoral arteries appear widely patent. Veins: The IVC and pelvic venous systems appear widely patent. Review of the MIP images confirms the above findings. _________________________________________________________ NON-VASCULAR Lower chest: Limited visualization of the lower thorax demonstrates interval development of trace bilateral effusions with worsening bibasilar consolidative opacities and associated air  bronchograms new compared to chest CT performed day prior and thus presumably secondary to atelectasis. Borderline cardiomegaly. There is diffuse decreased attenuation of the intra cardiac blood pool suggestive of anemia. Hepatobiliary: Normal hepatic contour. Vicarious excretion of contrast is seen within the gallbladder extending to the level of the CBD. No intrahepatic biliary duct dilatation. No discrete hepatic lesions. Pancreas: Normal appearance of the pancreas, though note, evaluation degraded secondary to streak artifact from embolization coils. Spleen: The spleen is again compressed secondary to continued enlargement of the perisplenic hemorrhage with exact measurements difficult to ascertain though dominant hematoma within the left upper abdominal quadrant now measuring approximate 10.2 x 7.3 cm (image 31, series 15), previously, 8.0 x 4.9 cm. The hematoma is again noted to extend along the left pericolic gutter (image 37, series 7), subjectively progressed in the interval. There is pooling of extravasated contrast about the medial aspect of the perisplenic hemorrhage (image 38, series 9; image 23, series 5). More simple appearing ascitic fluid is seen within the right mid hemiabdomen extending to the level of the pelvis grossly unchanged compared to the 04/12/2020 examination. Adrenals/Urinary Tract: There is symmetric enhancement and excretion of the bilateral kidneys. No renal stones. No urinary obstruction or perinephric stranding. High attenuation material is seen within the bilateral kidneys on the precontrast images suggestive of an element of renal insufficiency. Normal appearance of the  bilateral adrenals glands. Normal appearance of the urinary bladder given degree of distention. Stomach/Bowel: No evidence of enteric obstruction. No pneumoperitoneum, pneumatosis or portal venous gas. Lymphatic: Bilateral inguinal lymph nodes are prominent though individually not enlarged by size criteria with  index right inguinal lymph node measuring 1 cm greatest short axis diameter (image 98, series 15 and index left inguinal lymph node measuring 0.9 cm (image 101 series 15), likely reactive in etiology. No bulky retroperitoneal, mesenteric, pelvic or inguinal lymphadenopathy. Reproductive: Normal appearance the prostate gland. Other: Diffuse body wall anasarca. Small mesenteric fat and ascitic fluid containing inguinal hernia. Musculoskeletal: No acute or aggressive osseous abnormalities. Stigmata of dish within the thoracic spine. Mild to moderate multilevel lumbar spine DDD, worse at L4-L5 with disc space height loss, endplate irregularity and sclerosis. IMPRESSION: 1. Continued enlargement of perisplenic hemorrhage with pooling of extravasated contrast about the superomedial aspect of the enlarging hematoma. 2. Post percutaneous coil embolization of the splenic artery, suboptimally evaluated due to associated streak artifact from the embolization coils. 3.  Aortic Atherosclerosis (ICD10-I70.0). 4. Additional ancillary findings as discussed above and on preceding abdominal CT performed 10/06/2009. Critical Value/emergent results were called by telephone at the time of interpretation on 04/13/2020 at 2:52 pm to provider PABON, who verbally acknowledged these results. Electronically Signed   By: Sandi Mariscal M.D.   On: 04/13/2020 14:54     Resolved Hospital Problem list   X  Assessment & Plan:   ASSESSMENT / PLAN:  PULMONARY  A:  Acute postoperative respiratory failure 04/13/20 pm following splenectomy and hemoperitoneum - returned fro OR intubated 04/14/2020 - meets extubation criteria  P:   Extubate   NEUROLOGIC A:   History of substance abuse including cigarettes, cocaine and marijuana Evidence of postoperative pain 04/13/2020 and 04/14/20  P:   RASS sedation goal 0 to -2 Dc Propofol infusion Fentanyl infusion low dose with extubatio - > thent ransition to PRN RN directed or PCA as  apporpritate Continue nicotine patch  VASCULAR A:   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 At risk for hemorrhage but status post splenectomy with splenic artery ligature  04/14/2020  = Bp/HR stable. No active bleeding. 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 REsolved 04/13/20 pm  P:  Fluid optimization and hemodynamic optimization Avoid nephrotoxic medications or dose adjust  ELECTROLYTES A:  At risk for electrolyte imbalance  04/14/2020 - Mag < 2  P: replet mag Monitor closely   GASTROINTESTINAL A:   Status post laparotomy 04/13/2020 for splenic artery rupture status post splenectomy and splenic artery ligature  04/14/2020  - no abd rigiditidy. Just incisional pain  P:   For surgery  ? Needs NG tube to LIS  HEMATOLOGIC   - HEME A:  Hemorrhagic shock because of splenic rupture.  Status post control with status post splenectomy  - 11/7 - no active bleeding  P:  - PRBC for hgb </= 6.9gm%    - exceptions are   -  if ACS susepcted/confirmed then transfuse for hgb </= 8.0gm%,  or    -  active bleeding with hemodynamic instability, then transfuse regardless of hemoglobin value   At at all times try to transfuse 1 unit prbc as possible with exception of active hemorrhage   HEMATOLOGIC - Platelets A At risk for thrombocytopenia Status post hemorrhage  04/14/2020 - platelets normal  P SCDs. Hold off on antiplatelet agents  till risk for hemorrhage or hemorrhage recurrence disappears - surgery to give opinion on timing for lovenox/heparin restart  ENDOCRINE A:   At risk for hypo and hyperglycemia P:   SSI     Best practice:  Diet: npo Pain/Anxiety/Delirium protocol (if indicated): as above VAP protocol (if indicated): yses DVT prophylaxis: scd (need surgery input for restart heparin/lovenx) GI prophylaxis: ppi Glucose control: ssi Mobility: bed rest Code Status: full  Family Communication:   11/6 - sister Gene Paris - [phone] -> says the number I calling from which is the ICU at [facility] is not accepting calls because the call is blocked by a call blocker.  I then called son Karmen Bongo on [phone] and updted -> - he denied any questions  11/7 - calls to sister above was blocked. Call to son above ws placed and LMTCB 8:58 AM -> he called back 9:01 AM  and updated him   Disposition: icu -. But if stable later 11/7 go to floor. CCM off 11/8 AM and [facility] (service paged to do hand off)   Global:    ATTESTATION & SIGNATURE   The patient Caleb Brooks is critically ill with multiple organ systems failure and requires high complexity decision making for assessment and support, frequent evaluation and titration of therapies, application of advanced monitoring technologies and extensive interpretation of multiple databases.   Critical Care Time devoted to patient care services described in this note is  40  Minutes. This time reflects time of care of this signee Dr Brand Males. This critical care time does not reflect procedure time, or teaching time or supervisory time of PA/NP/Med student/Med Resident etc but could involve care discussion time     Dr. Brand Males, M.D., [facility].C.P Pulmonary and Critical Care Medicine Staff Physician [place] [facility] Pager: [phone], If no answer or between  15:00h - 7:00h: call [phone]   [phone]  04/14/2020 8:59 AM

## 2020-04-14 NOTE — Progress Notes (Signed)
CC: s/p splenectomy Subjective: Doing much better.  Mentation has significantly improved he has been extubated this morning.  There is a drop in hemoglobin but I do think that this has to do with the recover amount of blood that was processed via the Cell Saver.   Hemodynamics are adequate and drain is 420 but is mostly serosanguineous  Objective: Vital signs in last 24 hours: Temp:  [97.4 F (36.3 C)-98.8 F (37.1 C)] 97.6 F (36.4 C) (11/07 1600) Pulse Rate:  [64-87] 87 (11/07 1600) Resp:  [0-29] 0 (11/07 1600) BP: (96-161)/(69-104) 145/86 (11/07 1600) SpO2:  [88 %-100 %] 88 % (11/07 1600) FiO2 (%):  [40 %] 40 % (11/07 0835) Last BM Date: 04/12/20  Intake/Output from previous day: 11/06 0701 - 11/07 0700 In: 9598.3 [I.V.:5479.5; Blood:1700; IV Piggyback:1268.8] Out: 2025 [Urine:1425; Drains:420; Blood:3000] Intake/Output this shift: Total I/O In: 491.6 [P.O.:120; I.V.:21.6; IV Piggyback:350] Out: 150 [Drains:150]  Physical exam: NAd ,alert Abd; dressing intact. Drain serosanguinous, no peritonitis  Lab Results: CBC  Recent Labs    04/14/20 0021 04/14/20 0433  WBC 23.6* 20.6*  HGB 11.1* 10.7*  HCT 32.2* 31.8*  PLT 163 169   BMET Recent Labs    04/13/20 1833 04/14/20 0433  NA 139 139  K 4.0 4.2  CL 109 110  CO2 20* 24  GLUCOSE 111* 136*  BUN 13 11  CREATININE 0.84 0.81  CALCIUM 7.3* 7.2*   PT/INR Recent Labs    04/12/20 0207 04/13/20 0425  LABPROT 14.9 13.4  INR 1.2 1.1   ABG Recent Labs    04/13/20 1749  PHART 7.32*  HCO3 21.6    Studies/Results: DG Chest 1 View  Result Date: 04/13/2020 CLINICAL DATA:  Endotracheal and NG tube placement. EXAM: CHEST  1 VIEW COMPARISON:  Earlier today. FINDINGS: Endotracheal tube tip 7.4 cm from the carina. Enteric tube tip below the diaphragm in the stomach, side-port just at the gastroesophageal junction. Elevation of the right hemidiaphragm with probable right middle lobe collapse/atelectasis. Subsegmental  atelectasis at the left lung base. The heart is enlarged. No pulmonary edema. No pneumothorax or significant pleural effusion. Embolization coils, midline laparotomy skin staples, and surgical drain in the left upper quadrant. IMPRESSION: 1. Endotracheal tube tip 7.4 cm from the carina. Enteric tube tip below the diaphragm in the stomach, side-port just at the gastroesophageal junction. 2. Elevation of the right hemidiaphragm with probable right middle lobe collapse/atelectasis. Electronically Signed   By: Keith Rake M.D.   On: 04/13/2020 18:31   DG Chest Port 1 View  Result Date: 04/13/2020 CLINICAL DATA:  Acute respiratory failure. EXAM: PORTABLE CHEST 1 VIEW COMPARISON:  Earlier today. FINDINGS: Endotracheal tube tip 2.4 cm from the carina. Enteric tube has been advanced, the side port is now below the diaphragm. Persistent opacity at the right lung base favoring right middle or lower lobar collapse/atelectasis. Increasing retrocardiac opacity. Mild cardiomegaly with unchanged mediastinal contours. There may be developing small pleural effusions. No pneumothorax. IMPRESSION: 1. Endotracheal tube tip 2.4 cm from the carina. Enteric tube has been advanced, side port now below the diaphragm. 2. Persistent right lung base opacity favoring right lower lobar collapse/atelectasis. Increasing retrocardiac opacity. Electronically Signed   By: Keith Rake M.D.   On: 04/13/2020 22:24   DG Chest Port 1 View  Result Date: 04/13/2020 CLINICAL DATA:  Abnormal chest radiograph. Current smoker. Hypotension. Inpatient. EXAM: PORTABLE CHEST 1 VIEW COMPARISON:  Chest radiograph from one day prior. FINDINGS: Low lung volumes. Stable cardiomediastinal silhouette  with normal heart size. No pneumothorax. No pleural effusion. Increased patchy and streaky bibasilar lung opacities. No pulmonary edema. IMPRESSION: Low lung volumes. Increased patchy and streaky bibasilar lung opacities, favor increased atelectasis,  difficult to exclude a component of pneumonia or aspiration. Electronically Signed   By: Ilona Sorrel M.D.   On: 04/13/2020 12:33   ECHOCARDIOGRAM COMPLETE  Result Date: 04/14/2020    ECHOCARDIOGRAM REPORT   Patient Name:   Caleb Brooks Date of Exam: 04/14/2020 Medical Rec #:  [id number]     Height:       71.0 in Accession #:    [id number]    Weight:       168.4 lb Date of Birth:  12/27/1960      BSA:          1.960 m Patient Age:    59 years      BP:           102/74 mmHg Patient Gender: M             HR:           65 bpm. Exam Location:  [facility] Procedure: 2D Echo Indications:     Non Sustained SVT  History:         Patient has no prior history of Echocardiogram examinations.                  Risk Factors:Hypertension and Current Smoker.  Sonographer:     L Thornton-Maynard Referring Phys:  [id number] Carry Weesner F Shatona Andujar Diagnosing Phys: Kate Sable MD IMPRESSIONS  1. Left ventricular ejection fraction, by estimation, is 60 to 65%. The left ventricle has normal function. The left ventricle has no regional wall motion abnormalities. Left ventricular diastolic parameters were normal.  2. Right ventricular systolic function is normal. The right ventricular size is normal. There is normal pulmonary artery systolic pressure.  3. The mitral valve is normal in structure. No evidence of mitral valve regurgitation. No evidence of mitral stenosis.  4. The aortic valve is normal in structure. Aortic valve regurgitation is trivial. No aortic stenosis is present.  5. The inferior vena cava is normal in size with greater than 50% respiratory variability, suggesting right atrial pressure of 3 mmHg. FINDINGS  Left Ventricle: Left ventricular ejection fraction, by estimation, is 60 to 65%. The left ventricle has normal function. The left ventricle has no regional wall motion abnormalities. The left ventricular internal cavity size was normal in size. There is  no left ventricular hypertrophy. Left ventricular diastolic parameters were  normal. Right Ventricle: The right ventricular size is normal. No increase in right ventricular wall thickness. Right ventricular systolic function is normal. There is normal pulmonary artery systolic pressure. The tricuspid regurgitant velocity is 2.43 m/s, and  with an assumed right atrial pressure of 3 mmHg, the estimated right ventricular systolic pressure is 22.9 mmHg. Left Atrium: Left atrial size was normal in size. Right Atrium: Right atrial size was normal in size. Pericardium: There is no evidence of pericardial effusion. Mitral Valve: The mitral valve is normal in structure. No evidence of mitral valve regurgitation. No evidence of mitral valve stenosis. Tricuspid Valve: The tricuspid valve is normal in structure. Tricuspid valve regurgitation is not demonstrated. No evidence of tricuspid stenosis. Aortic Valve: The aortic valve is normal in structure. Aortic valve regurgitation is trivial. No aortic stenosis is present. Aortic valve mean gradient measures 6.0 mmHg. Aortic valve peak gradient measures 11.7 mmHg. Aortic valve area, by VTI measures  2.23 cm. Pulmonic Valve: The pulmonic valve was normal in structure. Pulmonic valve regurgitation is trivial. No evidence of pulmonic stenosis. Aorta: The aortic root is normal in size and structure. Venous: The inferior vena cava is normal in size with greater than 50% respiratory variability, suggesting right atrial pressure of 3 mmHg. IAS/Shunts: No atrial level shunt detected by color flow Doppler.  LEFT VENTRICLE PLAX 2D LVIDd:         4.89 cm  Diastology LVIDs:         2.87 cm  LV e' medial:    7.51 cm/s LV PW:         1.11 cm  LV E/e' medial:  7.7 LV IVS:        1.15 cm  LV e' lateral:   9.57 cm/s LVOT diam:     2.30 cm  LV E/e' lateral: 6.0 LV SV:         71 LV SV Index:   36 LVOT Area:     4.15 cm  RIGHT VENTRICLE RV S prime:     20.50 cm/s TAPSE (M-mode): 3.4 cm LEFT ATRIUM             Index       RIGHT ATRIUM           Index LA diam:        3.00 cm  1.53 cm/m  RA Area:     18.60 cm LA Vol (A2C):   61.9 ml 31.58 ml/m RA Volume:   53.30 ml  27.19 ml/m LA Vol (A4C):   49.2 ml 25.10 ml/m LA Biplane Vol: 58.4 ml 29.79 ml/m  AORTIC VALVE                    PULMONIC VALVE AV Area (Vmax):    2.53 cm     PV Vmax:       0.98 m/s AV Area (Vmean):   2.39 cm     PV Peak grad:  3.8 mmHg AV Area (VTI):     2.23 cm AV Vmax:           171.00 cm/s AV Vmean:          119.000 cm/s AV VTI:            0.318 m AV Peak Grad:      11.7 mmHg AV Mean Grad:      6.0 mmHg LVOT Vmax:         104.00 cm/s LVOT Vmean:        68.500 cm/s LVOT VTI:          0.171 m LVOT/AV VTI ratio: 0.54  AORTA Ao Root diam: 3.60 cm MITRAL VALVE               TRICUSPID VALVE MV Area (PHT): 3.55 cm    TR Peak grad:   23.6 mmHg MV E velocity: 57.80 cm/s  TR Vmax:        243.00 cm/s MV A velocity: 51.40 cm/s MV E/A ratio:  1.12        SHUNTS                            Systemic VTI:  0.17 m                            Systemic Diam: 2.30 cm Kate Sable MD Electronically signed  by Kate Sable MD Signature Date/Time: 04/14/2020/3:14:22 PM    Final    CT Angio Abd/Pel w/ and/or w/o  Result Date: 04/13/2020 CLINICAL DATA:  History splenic rupture with extravasation post percutaneous coil embolization with persistent instability. EXAM: CTA ABDOMEN AND PELVIS WITHOUT AND WITH CONTRAST TECHNIQUE: Multidetector CT imaging of the abdomen and pelvis was performed using the standard protocol during bolus administration of intravenous contrast. Multiplanar reconstructed images and MIPs were obtained and reviewed to evaluate the vascular anatomy. CONTRAST:  151mL OMNIPAQUE IOHEXOL 350 MG/ML SOLN COMPARISON:  CT abdomen pelvis-04/12/2020 FINDINGS: VASCULAR Aorta: Scattered mixed calcified and noncalcified atherosclerotic plaque throughout the normal caliber abdominal aorta, not resulting in a hemodynamically significant stenosis. No evidence of abdominal aortic dissection or periaortic stranding. Celiac:  Widely patent without hemodynamically significant narrowing. Conventional branching pattern. Post coil embolization of the presumed splenic artery, though evaluation degraded secondary to streak significant streak artifact associated with the embolization coils. SMA: Widely patent without hemodynamically significant narrowing though again, evaluation of the proximal aspect of the SMA is degraded secondary to streak artifact from the adjacent embolization coils. Renals: Solitary bilaterally; the bilateral renal arteries are widely patent without hemodynamically significant narrowing. No vessel irregularity to suggest FMD. IMA: Remains patent. Inflow: There is a minimal amount of calcified atherosclerotic plaque involving the bilateral common and internal iliac arteries, not resulting in hemodynamically significant stenosis. The bilateral external iliac arteries are tortuous though of normal caliber. Proximal Outflow: There is a minimal amount of mixed calcified and noncalcified atherosclerotic plaque involving the bilateral common femoral arteries, not resulting in hemodynamically significant stenosis. Radiopaque closure device material is seen anterior to the right common femoral artery. No associated hematoma. The imaged portions of the bilateral deep and superficial femoral arteries appear widely patent. Veins: The IVC and pelvic venous systems appear widely patent. Review of the MIP images confirms the above findings. _________________________________________________________ NON-VASCULAR Lower chest: Limited visualization of the lower thorax demonstrates interval development of trace bilateral effusions with worsening bibasilar consolidative opacities and associated air bronchograms new compared to chest CT performed day prior and thus presumably secondary to atelectasis. Borderline cardiomegaly. There is diffuse decreased attenuation of the intra cardiac blood pool suggestive of anemia. Hepatobiliary: Normal  hepatic contour. Vicarious excretion of contrast is seen within the gallbladder extending to the level of the CBD. No intrahepatic biliary duct dilatation. No discrete hepatic lesions. Pancreas: Normal appearance of the pancreas, though note, evaluation degraded secondary to streak artifact from embolization coils. Spleen: The spleen is again compressed secondary to continued enlargement of the perisplenic hemorrhage with exact measurements difficult to ascertain though dominant hematoma within the left upper abdominal quadrant now measuring approximate 10.2 x 7.3 cm (image 31, series 15), previously, 8.0 x 4.9 cm. The hematoma is again noted to extend along the left pericolic gutter (image 37, series 7), subjectively progressed in the interval. There is pooling of extravasated contrast about the medial aspect of the perisplenic hemorrhage (image 38, series 9; image 23, series 5). More simple appearing ascitic fluid is seen within the right mid hemiabdomen extending to the level of the pelvis grossly unchanged compared to the 04/12/2020 examination. Adrenals/Urinary Tract: There is symmetric enhancement and excretion of the bilateral kidneys. No renal stones. No urinary obstruction or perinephric stranding. High attenuation material is seen within the bilateral kidneys on the precontrast images suggestive of an element of renal insufficiency. Normal appearance of the bilateral adrenals glands. Normal appearance of the urinary bladder given degree of distention. Stomach/Bowel: No  evidence of enteric obstruction. No pneumoperitoneum, pneumatosis or portal venous gas. Lymphatic: Bilateral inguinal lymph nodes are prominent though individually not enlarged by size criteria with index right inguinal lymph node measuring 1 cm greatest short axis diameter (image 98, series 15 and index left inguinal lymph node measuring 0.9 cm (image 101 series 15), likely reactive in etiology. No bulky retroperitoneal, mesenteric, pelvic  or inguinal lymphadenopathy. Reproductive: Normal appearance the prostate gland. Other: Diffuse body wall anasarca. Small mesenteric fat and ascitic fluid containing inguinal hernia. Musculoskeletal: No acute or aggressive osseous abnormalities. Stigmata of dish within the thoracic spine. Mild to moderate multilevel lumbar spine DDD, worse at L4-L5 with disc space height loss, endplate irregularity and sclerosis. IMPRESSION: 1. Continued enlargement of perisplenic hemorrhage with pooling of extravasated contrast about the superomedial aspect of the enlarging hematoma. 2. Post percutaneous coil embolization of the splenic artery, suboptimally evaluated due to associated streak artifact from the embolization coils. 3.  Aortic Atherosclerosis (ICD10-I70.0). 4. Additional ancillary findings as discussed above and on preceding abdominal CT performed 10/06/2009. Critical Value/emergent results were called by telephone at the time of interpretation on 04/13/2020 at 2:52 pm to provider Yafet Cline, who verbally acknowledged these results. Electronically Signed   By: Sandi Mariscal M.D.   On: 04/13/2020 14:54    Anti-infectives: Anti-infectives (From admission, onward)   Start     Dose/Rate Route Frequency Ordered Stop   04/13/20 1200  doxycycline (VIBRAMYCIN) 100 mg in sodium chloride 0.9 % 250 mL IVPB        100 mg 125 mL/hr over 120 Minutes Intravenous Every 12 hours 04/13/20 1112     04/12/20 2200  cefTRIAXone (ROCEPHIN) 2 g in sodium chloride 0.9 % 100 mL IVPB        2 g 200 mL/hr over 30 Minutes Intravenous Every 24 hours 04/12/20 1530     04/12/20 2200  metroNIDAZOLE (FLAGYL) IVPB 500 mg        500 mg 100 mL/hr over 60 Minutes Intravenous Every 8 hours 04/12/20 1530     04/12/20 1300  vancomycin (VANCOREADY) IVPB 750 mg/150 mL  Status:  Discontinued        750 mg 150 mL/hr over 60 Minutes Intravenous Every 12 hours 04/12/20 0544 04/13/20 1112   04/12/20 0600  meropenem (MERREM) 1 g in sodium chloride 0.9 % 100  mL IVPB  Status:  Discontinued        1 g 200 mL/hr over 30 Minutes Intravenous Every 8 hours 04/12/20 0526 04/12/20 1530   04/12/20 0545  vancomycin (VANCOREADY) IVPB 750 mg/150 mL        750 mg 150 mL/hr over 60 Minutes Intravenous  Once 04/12/20 0537 04/12/20 0949   04/12/20 0530  vancomycin (VANCOCIN) IVPB 1000 mg/200 mL premix  Status:  Discontinued        1,000 mg 200 mL/hr over 60 Minutes Intravenous  Once 04/12/20 0526 04/12/20 0650   04/12/20 0524  ceFAZolin (ANCEF) IVPB 2g/100 mL premix        2 g 200 mL/hr over 30 Minutes Intravenous 30 min pre-op 04/12/20 0524 04/12/20 0554   04/12/20 0030  piperacillin-tazobactam (ZOSYN) IVPB 3.375 g        3.375 g 100 mL/hr over 30 Minutes Intravenous  Once 04/12/20 0021 04/12/20 0213   04/12/20 0030  vancomycin (VANCOCIN) IVPB 1000 mg/200 mL premix        1,000 mg 200 mL/hr over 60 Minutes Intravenous  Once 04/12/20 0021 04/12/20 0236  Assessment/Plan: Status post a splenectomy for ruptured spleen and hemorrhagic shock.  He is doing surprisingly well given the degree of injury and his baseline comorbidities.  No evidence of recurrent bleed.  May remove the NG and advance diet to clear liquid diet.  He is still in a tenuous state and I would recommend keeping him in the ICU given the degree of his injury  Caroleen Hamman, MD, [facility]  04/14/2020

## 2020-04-14 NOTE — Progress Notes (Signed)
Patient remains intubated and sedated. Patient has fentanyl and propofol infusing, tolerating well. Vent settings have remained the same throughout the shift. NG tube to LWIS per order with an unmeasurable amount of green  gastric contents noted in tube. Midline abd incision covered with honeycomb dressing is dry and intact. JP drain had 400 ml of sanguinous drainage this shift; JP dressing became saturated and was reinforced with an ABD pad. Foley continues to drain well with adequate output. H/H is currently 10.7/31.8. VSS. Will continue to monitor.

## 2020-04-14 NOTE — Care Management (Signed)
This is a no charge note  Pick up from PCCM, Dr. Chase Caller  59 year old male with past medical history of alcohol abuse, hypertension, anxiety, arthritis, who was admitted to ICU due to traumatic splenic hemorrhage and shock.  Patient is s/p of splenectomy, transfused with blood.  Extubated and stabilized. Pt also has suppurative cellulitis with history of hidradenitis on antibiotics.  We need to pick up this patient tomorrow morning.  Ivor Costa, MD  [facility]   If 7PM-7AM, please contact night-coverage [email or web address] 04/14/2020, 9:09 AM

## 2020-04-15 ENCOUNTER — Encounter: Payer: Self-pay | Admitting: Surgery

## 2020-04-15 ENCOUNTER — Other Ambulatory Visit: Payer: Self-pay

## 2020-04-15 LAB — BPAM RBC
Blood Product Expiration Date: 202112042359
Blood Product Expiration Date: 202112042359
Blood Product Expiration Date: 202112042359
Blood Product Expiration Date: 202112052359
Blood Product Expiration Date: 202112062359
Blood Product Expiration Date: 202112062359
Blood Product Expiration Date: 202112102359
Blood Product Expiration Date: 202112102359
Blood Product Expiration Date: 202112112359
ISSUE DATE / TIME: 202111010054
ISSUE DATE / TIME: 202111050202
ISSUE DATE / TIME: 202111050202
ISSUE DATE / TIME: 202111050444
ISSUE DATE / TIME: 202111061453
ISSUE DATE / TIME: 202111061453
ISSUE DATE / TIME: 202111061453
ISSUE DATE / TIME: 202111061453
Unit Type and Rh: 5100
Unit Type and Rh: 5100
Unit Type and Rh: 5100
Unit Type and Rh: 5100
Unit Type and Rh: 5100
Unit Type and Rh: 5100
Unit Type and Rh: 5100
Unit Type and Rh: 5100
Unit Type and Rh: 5100

## 2020-04-15 LAB — TYPE AND SCREEN
ABO/RH(D): O POS
Antibody Screen: NEGATIVE
Unit division: 0
Unit division: 0
Unit division: 0
Unit division: 0
Unit division: 0
Unit division: 0
Unit division: 0
Unit division: 0
Unit division: 0

## 2020-04-15 LAB — CBC
HCT: 32.3 % — ABNORMAL LOW (ref 39.0–52.0)
Hemoglobin: 10.7 g/dL — ABNORMAL LOW (ref 13.0–17.0)
MCH: 30.1 pg (ref 26.0–34.0)
MCHC: 33.1 g/dL (ref 30.0–36.0)
MCV: 91 fL (ref 80.0–100.0)
Platelets: 210 10*3/uL (ref 150–400)
RBC: 3.55 MIL/uL — ABNORMAL LOW (ref 4.22–5.81)
RDW: 15.1 % (ref 11.5–15.5)
WBC: 19.2 10*3/uL — ABNORMAL HIGH (ref 4.0–10.5)
nRBC: 2.1 % — ABNORMAL HIGH (ref 0.0–0.2)

## 2020-04-15 LAB — GLUCOSE, CAPILLARY
Glucose-Capillary: 97 mg/dL (ref 70–99)
Glucose-Capillary: 93 mg/dL (ref 70–99)
Glucose-Capillary: 84 mg/dL (ref 70–99)
Glucose-Capillary: 93 mg/dL (ref 70–99)
Glucose-Capillary: 88 mg/dL (ref 70–99)

## 2020-04-15 LAB — COMPREHENSIVE METABOLIC PANEL
ALT: 21 U/L (ref 0–44)
AST: 67 U/L — ABNORMAL HIGH (ref 15–41)
Albumin: 2.4 g/dL — ABNORMAL LOW (ref 3.5–5.0)
Alkaline Phosphatase: 61 U/L (ref 38–126)
Anion gap: 8 (ref 5–15)
BUN: 9 mg/dL (ref 6–20)
CO2: 26 mmol/L (ref 22–32)
Calcium: 7.9 mg/dL — ABNORMAL LOW (ref 8.9–10.3)
Chloride: 103 mmol/L (ref 98–111)
Creatinine, Ser: 0.74 mg/dL (ref 0.61–1.24)
GFR, Estimated: >60 mL/min (ref >60)
Glucose, Bld: 90 mg/dL (ref 70–99)
Potassium: 3.1 mmol/L — ABNORMAL LOW (ref 3.5–5.1)
Sodium: 137 mmol/L (ref 135–145)
Total Bilirubin: 0.7 mg/dL (ref 0.3–1.2)
Total Protein: 6 g/dL — ABNORMAL LOW (ref 6.5–8.1)

## 2020-04-15 LAB — PHOSPHORUS: Phosphorus: 1.8 mg/dL — ABNORMAL LOW (ref 2.5–4.6)

## 2020-04-15 LAB — PREPARE RBC (CROSSMATCH)

## 2020-04-15 MED ORDER — HYDRALAZINE HCL 25 MG PO TABS
25.0000 mg | ORAL_TABLET | Freq: Three times a day (TID) | ORAL | Status: DC | PRN
  Administered 2020-04-15: 25 mg via ORAL
  Filled 2020-04-15: qty 1

## 2020-04-15 MED ORDER — LORAZEPAM 2 MG/ML IJ SOLN: 1.0000 mg | INTRAMUSCULAR | Status: DC | PRN

## 2020-04-15 MED ORDER — THIAMINE HCL 100 MG PO TABS
100.0000 mg | ORAL_TABLET | Freq: Every day | ORAL | Status: DC
  Administered 2020-04-15 – 2020-04-18 (×4): 100 mg via ORAL
  Filled 2020-04-15 (×4): qty 1

## 2020-04-15 MED ORDER — FOLIC ACID 1 MG PO TABS
1.0000 mg | ORAL_TABLET | Freq: Every day | ORAL | Status: DC
  Administered 2020-04-15 – 2020-04-18 (×4): 1 mg via ORAL
  Filled 2020-04-15 (×4): qty 1

## 2020-04-15 MED ORDER — POTASSIUM & SODIUM PHOSPHATES 280-160-250 MG PO PACK
2.0000 | PACK | ORAL | Status: AC
  Administered 2020-04-15 (×3): 2 via ORAL
  Filled 2020-04-15 (×3): qty 2

## 2020-04-15 MED ORDER — DEXMEDETOMIDINE HCL IN NACL 400 MCG/100ML IV SOLN: 0.2000 ug/kg/h | INTRAVENOUS | Status: DC

## 2020-04-15 MED ORDER — ALUM & MAG HYDROXIDE-SIMETH 200-200-20 MG/5ML PO SUSP
30.0000 mL | ORAL | Status: DC | PRN
  Administered 2020-04-15: 30 mL via ORAL
  Filled 2020-04-15: qty 30

## 2020-04-15 MED ORDER — ADULT MULTIVITAMIN W/MINERALS CH
1.0000 | ORAL_TABLET | Freq: Every day | ORAL | Status: DC
  Administered 2020-04-15 – 2020-04-18 (×4): 1 via ORAL
  Filled 2020-04-15 (×4): qty 1

## 2020-04-15 MED ORDER — POTASSIUM CHLORIDE IN NACL 40-0.9 MEQ/L-% IV SOLN
INTRAVENOUS | Status: DC
  Filled 2020-04-15 (×5): qty 1000

## 2020-04-15 MED ORDER — DOCUSATE SODIUM 50 MG/5ML PO LIQD
100.0000 mg | Freq: Two times a day (BID) | ORAL | Status: DC
  Filled 2020-04-15 (×7): qty 10

## 2020-04-15 MED ORDER — POLYETHYLENE GLYCOL 3350 17 G PO PACK
17.0000 g | PACK | Freq: Every day | ORAL | Status: DC
  Administered 2020-04-18: 17 g via ORAL
  Filled 2020-04-15 (×3): qty 1

## 2020-04-15 NOTE — Progress Notes (Signed)
PT Cancellation Note  Patient Details Name: Caleb Brooks MRN: [id number] DOB: 1960/08/29   Cancelled Treatment:    Reason Eval/Treat Not Completed: Medical issues which prohibited therapy (Chart reviewed again, noted progressive HTN since AM, most recently 188/115 mmHg. Will hold PT until pt medically appropriate to commence PT.)  12:56 PM, 04/15/20 [name], PT, DPT Physical Therapist - [facility]  [phone] ([place])   [place] 04/15/2020, 12:56 PM

## 2020-04-15 NOTE — Consult Note (Signed)
[place] for Electrolyte Monitoring and Replacement   Recent Labs: Potassium (mmol/L)  Date Value  04/15/2020 3.1 (L)   Magnesium (mg/dL)  Date Value  04/14/2020 1.9   Calcium (mg/dL)  Date Value  04/15/2020 7.9 (L)   Albumin (g/dL)  Date Value  04/15/2020 2.4 (L)  03/14/2020 4.1   Phosphorus (mg/dL)  Date Value  04/15/2020 1.8 (L)   Sodium (mmol/L)  Date Value  04/15/2020 137  03/14/2020 140   Corrected calcium: 9.2 mg/dL  Assessment: Patient is a 59 y/o M with medical history including hypertension, hidradenitis suppurativa, EtOH and tobacco abuse who is admitted with splenic hemorrhage and perineal cellulitis. Patient is now POD # 3 from exploratory laparotomy with splenectomy. Pharmacy has been consulted to assist with electrolyte monitoring and replacement.   Diet: CLD MIVF: NS at 75 mL/hr  Goal of Therapy:  Electrolytes within normal limits  Plan:  11/8 K 3.1, Phos 1.8 --Will modify MIVF to NS + 40 mEq K/L at 75 mL/hr (72 mEq K/day) --Will order Phos-Nak 2 packets q4h x 3 doses --Re-check all electrolytes with AM labs  Benita Gutter 04/15/2020 12:13 PM

## 2020-04-15 NOTE — Hospital Course (Signed)
Caleb Brooks  is a 59 y.o. African-American male with a known history of hypertension, hidradenitis suppurativa complicated by recurrent boils and keloid formation anxiety and osteoarthritis as well as EtOH and tobacco abuse who presented to the ED on 04/12/2020 after two syncopal episodes.  Found hypotensive with BP 54/30 by EMS.  Had a fall few days prior with progressive left upper quadrant pain since.  Also with recent purulent drainage, pain and swelling of perineal boils.  Admitted to ICU/stepdown for hemorrhagic shock secondary to splenic hemorrhage with hemoperitoneum.  Placed on IV antibiotics for apparent infected boils secondary to HS.    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  Patient remained intubated after surgery, care assumed by PCCM.  Transfused several units pRBC's and Cell saver utilized during surgery.    [facility] re-assumed care on 11/8.  Patient transferred out of ICU, stable and doing well overall.  Diet being advanced.  Remains on IV antibiotics.

## 2020-04-15 NOTE — Progress Notes (Addendum)
Chart reviewed. Bedside swallow eval completed. Report to follow. No s/s of aspiration with any tested consistencies. Rec Dys 3 diet once MD approves advance from clear liquids after surgery. . ST to follow up with toleration of diet 1-2 days,

## 2020-04-15 NOTE — Progress Notes (Signed)
[facility] Day(s): 3.   Post op day(s): 2 Days Post-Op.   Interval History:  Patient seen and examined no acute events or new complaints overnight.  He has been able to maintain hemodynamics adequately Patient reports he is very sore in his abdomen, incisional, pain medications helping some No fever, chills, nausea, emesis Expected leukocytosis s/p splenectomy; improving down to 19.2 Hgb stable at 10.7 this morning (t11.1 --. 10.7 --> 10.7) Renal function remains normal, sCr - 0.74, UO - 2.0L Mild hypokalemia to 3.1 Surgical drain with 480 ccs out; serosanguinous Advanced to CLD yesterday; tolerating Has not mobilized  Vital signs in last 24 hours: [min-max] current  Temp:  [97.6 F (36.4 C)-99.1 F (37.3 C)] 98.5 F (36.9 C) (11/08 0000) Pulse Rate:  [69-92] 69 (11/08 0230) Resp:  [0-20] 12 (11/08 0230) BP: (128-181)/(81-114) 152/95 (11/08 0230) SpO2:  [86 %-100 %] 95 % (11/08 0230) FiO2 (%):  [40 %] 40 % (11/07 2015)     Height: 5\' 11"  (180.3 cm) Weight: 76.4 kg BMI (Calculated): 23.5   Intake/Output last 2 shifts:  11/07 0701 - 11/08 0700 In: 1474.2 [P.O.:120; I.V.:104.3; IV Piggyback:1250] Out: 2530 [Urine:2050; Drains:480]   Physical Exam:  Constitutional: alert, cooperative and no distress  HEENT: NGT in place (I removed this) Respiratory: breathing non-labored at rest  Cardiovascular: regular rate and sinus rhythm  Gastrointestinal: Soft, incisional soreness, mild distension, no rebound/guarding, surgical drain in LUQ with serosanguinous output Genitourinary: Foley in place  Integumentary: Laparotomy incision in CDI with staples, no erythema or drainage   Labs:  CBC Latest Ref Rng & Units 04/15/2020 04/14/2020 04/14/2020  WBC 4.0 - 10.5 K/uL 19.2(H) 20.6(H) 23.6(H)  Hemoglobin 13.0 - 17.0 g/dL 10.7(L) 10.7(L) 11.1(L)  Hematocrit 39 - 52 % 32.3(L) 31.8(L) 32.2(L)  Platelets 150 - 400 K/uL 210 169 163   CMP  Latest Ref Rng & Units 04/14/2020 04/13/2020 04/13/2020  Glucose 70 - 99 mg/dL 136(H) 111(H) 121(H)  BUN 6 - 20 mg/dL 11 13 20   Creatinine 0.61 - 1.24 mg/dL 0.81 0.84 1.35(H)  Sodium 135 - 145 mmol/L 139 139 138  Potassium 3.5 - 5.1 mmol/L 4.2 4.0 4.5  Chloride 98 - 111 mmol/L 110 109 106  CO2 22 - 32 mmol/L 24 20(L) 24  Calcium 8.9 - 10.3 mg/dL 7.2(L) 7.3(L) 7.9(L)  Total Protein 6.5 - 8.1 g/dL 5.2(L) 5.8(L) 6.0(L)  Total Bilirubin 0.3 - 1.2 mg/dL 0.7 1.3(H) 0.6  Alkaline Phos 38 - 126 U/L 64 74 76  AST 15 - 41 U/L 36 35 18  ALT 0 - 44 U/L 13 13 10      Imaging studies: No new pertinent imaging studies   Assessment/Plan:  59 y.o. 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   - Continue CLD for now + IVF resuscitation  - I removed NGT this morning; monitor bowel function  - Monitor H&H; stable  - Monitor abdominal examination  - Continue IV Abx (Rocephin + Flagyl)   - Pain control prn; antiemetics prn  - Reassess need for foley; renal function normal, good UO   - Would recommend engaging PT   - Okay to transfer to floor from surgical perspective   - Further management per primary service; we will follow   All of the above findings and recommendations were discussed with the patient, and the medical team, and all of patient's questions  were answered to his expressed satisfaction.  -- Caleb Simon, PA-C [facility] 04/15/2020, 7:41 AM [phone] M-F: 7am - 4pm

## 2020-04-15 NOTE — Progress Notes (Signed)
PT Cancellation Note  Patient Details Name: Caleb Brooks MRN: [id number] DOB: Jul 16, 1960   Cancelled Treatment:    Reason Eval/Treat Not Completed: Medical issues which prohibited therapy (Chart reviewed, noted progressive HTN overnight, most recently 178/167mmHg. Will hold PT until pt medically appropriate to commence PT.)  9:09 AM, 04/15/20 [name], PT, DPT Physical Therapist - [facility]  [phone] ([place])   [name] 04/15/2020, 9:09 AM

## 2020-04-15 NOTE — Evaluation (Signed)
Clinical/Bedside Swallow Evaluation Patient Details  Name: JAHMERE BRAMEL MRN: [id number] Date of Birth: 09-Aug-1960  Today's Date: 04/15/2020 Time: SLP Start Time (ACUTE ONLY): 30 SLP Stop Time (ACUTE ONLY): 1100 SLP Time Calculation (min) (ACUTE ONLY): 30 min  Past Medical History:  Past Medical History:  Diagnosis Date  . Anxiety   . Arthritis   . Hypertension    Past Surgical History:  Past Surgical History:  Procedure Laterality Date  . CYST REMOVAL TRUNK     Groin area around 2000  . EMBOLIZATION N/A 04/12/2020   Procedure: EMBOLIZATION, spleen;  Surgeon: Algernon Huxley, MD;  Location: [facility];  Service: Cardiovascular;  Laterality: N/A;  . INCISION AND DRAINAGE ABSCESS N/A 07/26/2019   Procedure: INCISION AND DRAINAGE ABSCESS;  Surgeon: Hollice Espy, MD;  Location: [facility];  Service: Urology;  Laterality: N/A;  . INCISION AND DRAINAGE ABSCESS N/A 07/26/2019   Procedure: INCISION AND DRAINAGE ABSCESS;  Surgeon: Benjamine Sprague, DO;  Location: [facility];  Service: General;  Laterality: N/A;   HPI:  Per admitting H & P "Dhruvan Gullion  is a 59 y.o. African-American male with a known history of hypertension, hidradenitis suppurativa complicated by recurrent boils and keloid formation anxiety and osteoarthritis as well as EtOH and tobacco abuse, presented to the emergency room with acute onset of syncope twice with associated lightheadedness and dizziness.  The patient was noted to be hypotensive with a BP of 54/30 by EMS.  He has been having purulent drainage from his perineal boils for a few days with associated swelling and severe pain that has been constant.  He admits to left upper quadrant abdominal pain after having a fall on his side.  No nausea or vomiting or melena or bright red bleeding per rectum.  No dyspnea or cough or wheezing or hemoptysis.  No other bleeding diathesis.  No chest pain or palpitations"   Assessment / Plan / Recommendation Clinical Impression  Pt  presents with mostly functional swallowing at bedside. No s/s of aspiration with any tested consistency. Oral mech exam revealed structures to be functioning adequately. Slow oral transit with solids but Pt was able to clear oral cavity of any residue. Vocal quality remained clear, laryngeal elevation appeared adequate. Rec diet upgrade to Dys 3 once MD has cleared from surgery (currently on clear liquid diet). Secure chat sent to MD with diet recs. Pt c/o pain in his abdomen. Nsg notified and reported Pt just received his pain meds. Pt assisted with repositioning and reported relief of pain afterwards. ST to follow up with toleration of diet once advanced. Prognosis good. SLP Visit Diagnosis: Dysphagia, oral phase (R13.11)    Aspiration Risk  Mild aspiration risk    Diet Recommendation Dysphagia 3 (Mech soft)   Medication Administration: Whole meds with liquid Supervision: Patient able to self feed Compensations: Slow rate;Small sips/bites Postural Changes: Seated upright at 90 degrees;Remain upright for at least 30 minutes after po intake    Other  Recommendations   N/A  Follow up Recommendations  F/u with toleration of diet      Frequency and Duration min 1 x/week  1 week       Prognosis Prognosis for Safe Diet Advancement: Good      Swallow Study   General Date of Onset: 04/12/20 HPI: Per admitting H & P "Wyatte Dames  is a 59 y.o. African-American male with a known history of hypertension, hidradenitis suppurativa complicated by recurrent boils and keloid formation anxiety and  osteoarthritis as well as EtOH and tobacco abuse, presented to the emergency room with acute onset of syncope twice with associated lightheadedness and dizziness.  The patient was noted to be hypotensive with a BP of 54/30 by EMS.  He has been having purulent drainage from his perineal boils for a few days with associated swelling and severe pain that has been constant.  He admits to left upper quadrant  abdominal pain after having a fall on his side.  No nausea or vomiting or melena or bright red bleeding per rectum.  No dyspnea or cough or wheezing or hemoptysis.  No other bleeding diathesis.  No chest pain or palpitations" Type of Study: Bedside Swallow Evaluation Diet Prior to this Study: Other (Comment) (Clear liquids) Temperature Spikes Noted: No History of Recent Intubation: Yes Date extubated: 04/14/20 Behavior/Cognition: Alert;Cooperative;Pleasant mood Oral Cavity Assessment: Within Functional Limits Oral Cavity - Dentition: Adequate natural dentition Vision: Functional for self-feeding Self-Feeding Abilities: Needs set up Patient Positioning: Upright in bed Baseline Vocal Quality: Normal    Oral/Motor/Sensory Function Overall Oral Motor/Sensory Function: Within functional limits   Ice Chips     Thin Liquid Thin Liquid: Within functional limits Presentation: Cup;Straw    Nectar Thick Nectar Thick Liquid: Not tested   Honey Thick Honey Thick Liquid: Not tested   Puree Puree: Within functional limits Presentation: Spoon   Solid     Solid: Within functional limits Presentation: Self Fed      Lucila Maine 04/15/2020,11:12 AM

## 2020-04-15 NOTE — Progress Notes (Signed)
PROGRESS NOTE    Caleb Brooks   KCL:[id number]  DOB: [birth year]  PCP: Langston Reusing, NP    DOA: 04/12/2020 LOS: 3   Brief Narrative   Caleb Brooks  is a 59 y.o. African-American male with a known history of hypertension, hidradenitis suppurativa complicated by recurrent boils and keloid formation anxiety and osteoarthritis as well as EtOH and tobacco abuse who presented to the ED on 04/12/2020 after two syncopal episodes.  Found hypotensive with BP 54/30 by EMS.  Had a fall few days prior with progressive left upper quadrant pain since.  Also with recent purulent drainage, pain and swelling of perineal boils.  Admitted to ICU/stepdown for hemorrhagic shock secondary to splenic hemorrhage with hemoperitoneum.  Placed on IV antibiotics for apparent infected boils secondary to HS.    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  Patient remained intubated after surgery, care assumed by PCCM.  Transfused several units pRBC's and Cell saver utilized during surgery.    TRH re-assumed care on 11/8.  Patient transferred out of ICU, stable and doing well overall.  Diet being advanced.  Remains on IV antibiotics.         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 --will need vaccination against encapsulated organisms  -- vascular surgery c/s, appreciate assistance -- general surgery c/s, appreciate assistance -- analgesia & bowel regimen PRN --advance diet per surgery --on Rocephin, Flagyl - continue --PT / mobilize --transfer to med/surg --monitor CBC, CMP, Mg, Phos  Electrolyte abnormalities - hypokalemia, hypophosphatemia,  - pharmacy consulted for  replacement  Perineal severe suppurative cellulitis with history of hidradenitis suppurativa Suspect primary reason for shock was from acute blood loss anemia rather than sepsis, though pt notes worsening drainage recently  -- continue current antibiotics -- appreciate general surgery c/s - recommending outpatient follow up for HS -- follow blood cultures  Leukocytosis: likely reactive, and related to cellulitis.  On antibiotics as above.  Monitor CBC.  Acute Metabolic Encephalopathy: POA, resolved.  suspect 2/2 sedating medications and acute illness.   Monitor.  NSVT: Echo normal on 11/7.  Telemetry.  Monitor and replace electrolytes.  AKI: 2/2 hemorrhagic shock, follow - improved  Abnormal CXR: possible atypical infection.  No respiratory symptoms today (11/8).  Already on abx.  Negative covid/flu.  Possible Liver Disease vs Cirrhosis LFT's wnl, follow outpatient.  Normal INR, bili, platelets.  Tobacco abuse. Counseled on cessation.  Alcohol abuse. Monitored for withdrawal.  Given IV banana bag.  PRN Ativan per CIWA.  No issues reported thus far.   DVT prophylaxis: Place and maintain sequential compression device Start: 04/13/20 1834 SCDs Start: 04/12/20 0516   Diet:  Diet Orders (From admission, onward)    Start     Ordered   04/15/20 1449  Diet full liquid Room service appropriate? Yes; Fluid consistency: Thin  Diet effective now       Question Answer Comment  Room service appropriate? Yes   Fluid consistency: Thin      04/15/20 1448            Code Status: Full Code    Subjective 04/15/20    Pt seen in ICU on rounds.  Reports 9 out of 10 abdominal pain, having episodes of sharp burning pain.  No fever/chills.  On clear liquid diet, appears nothing on tray consumed yet.  No BM yet since surgery.   Disposition Plan & Communication   Status is: Inpatient  Inpatient status remains appropriate because of severity of illness, requiring IV medications,  inadequate oral intake.    Dispo: The patient is from: home              Anticipated d/c is to: home              Anticipated d/c date is: 3 days              Patient currently is not medically stable for discharge.  Family Communication: none at bedside   Consults, Procedures, Significant Events   Consultants:   General surgery  PCCM  Procedures:  11/5 - EMbolization splenic art 11/6 - p lap and splenectomy  Antimicrobials:  Anti-infectives (From admission, onward)   Start     Dose/Rate Route Frequency Ordered Stop   04/13/20 1200  doxycycline (VIBRAMYCIN) 100 mg in sodium chloride 0.9 % 250 mL IVPB        100 mg 125 mL/hr over 120 Minutes Intravenous Every 12 hours 04/13/20 1112     04/12/20 2200  cefTRIAXone (ROCEPHIN) 2 g in sodium chloride 0.9 % 100 mL IVPB        2 g 200 mL/hr over 30 Minutes Intravenous Every 24 hours 04/12/20 1530     04/12/20 2200  metroNIDAZOLE (FLAGYL) IVPB 500 mg        500 mg 100 mL/hr over 60 Minutes Intravenous Every 8 hours 04/12/20 1530     04/12/20 1300  vancomycin (VANCOREADY) IVPB 750 mg/150 mL  Status:  Discontinued        750 mg 150 mL/hr over 60 Minutes Intravenous Every 12 hours 04/12/20 0544 04/13/20 1112   04/12/20 0600  meropenem (MERREM) 1 g in sodium chloride 0.9 % 100 mL IVPB  Status:  Discontinued        1 g 200 mL/hr over 30 Minutes Intravenous Every 8 hours 04/12/20 0526 04/12/20 1530   04/12/20 0545  vancomycin (VANCOREADY) IVPB 750 mg/150 mL        750 mg 150 mL/hr over 60 Minutes Intravenous  Once 04/12/20 0537 04/12/20 0949   04/12/20 0530  vancomycin (VANCOCIN) IVPB 1000 mg/200 mL premix  Status:  Discontinued        1,000 mg 200 mL/hr over 60 Minutes Intravenous  Once 04/12/20 0526 04/12/20 0650   04/12/20 0524  ceFAZolin (ANCEF) IVPB 2g/100 mL premix        2 g 200 mL/hr over 30 Minutes Intravenous 30 min pre-op 04/12/20 0524 04/12/20 0554   04/12/20 0030  piperacillin-tazobactam (ZOSYN) IVPB 3.375 g         3.375 g 100 mL/hr over 30 Minutes Intravenous  Once 04/12/20 0021 04/12/20 0213   04/12/20 0030  vancomycin (VANCOCIN) IVPB 1000 mg/200 mL premix        1,000 mg 200 mL/hr over 60 Minutes Intravenous  Once 04/12/20 0021 04/12/20 0236       Significant Events: 04/12/2020 - admit  11/6 - surgery, continued intubated postop 11/8 - transfer out of ICU  Objective   Vitals:   04/15/20 1400 04/15/20 1500 04/15/20 1600 04/15/20 1731  BP: (!) 169/108 (!) 161/94 (!) 173/93 (!) 172/107  Pulse: 71 77 69 69  Resp: 16 16 14  19  Temp:    97.8 F (36.6 C)  TempSrc:    Oral  SpO2: 91% 94% 93% 95%  Weight:      Height:        Intake/Output Summary (Last 24 hours) at 04/15/2020 1939 Last data filed at 04/15/2020 1900 Gross per 24 hour  Intake 1719.09 ml  Output 4260 ml  Net -2540.91 ml   Filed Weights   04/12/20 0013 04/12/20 0620  Weight: 74.8 kg 76.4 kg    Physical Exam:  General exam: awake, alert, no acute distress membranes, hearing grossly normal  Respiratory system: CTAB, no wheezes, rales or rhonchi, normal respiratory effort. Cardiovascular system: normal S1/S2,  RRR, no pedal edema.   Gastrointestinal system: appropriate post-op tenderness, non-distended, hypoactive bowel sounds. Central nervous system: A&O x3. no gross focal neurologic deficits, normal speech Extremities: moves all, no edema Skin: no purulence or boils seen on inguinal exam, skin is dry intact and normal temperature   Labs   Data Reviewed: I have personally reviewed following labs and imaging studies  CBC: Recent Labs  Lab 04/12/20 0016 04/12/20 0016 04/12/20 0930 04/12/20 1612 04/13/20 0425 04/13/20 0425 04/13/20 1212 04/13/20 1833 04/14/20 0021 04/14/20 0433 04/15/20 0649  WBC 15.9*   < > 19.9*  --  18.9*  --   --  28.1* 23.6* 20.6* 19.2*  NEUTROABS 13.4*  --  17.0*  --   --   --   --   --   --  18.4*  --   HGB 10.9*   < > 9.9*   < > 7.8*   < > 7.3* 13.1 11.1* 10.7* 10.7*  HCT 33.5*   <  > 30.7*   < > 24.1*   < > 22.0* 39.4 32.2* 31.8* 32.3*  MCV 96.3   < > 91.4  --  92.3  --   --  92.3 90.2 90.9 91.0  PLT 292   < > 217  --  211  --   --  159 163 169 210   < > = values in this interval not displayed.   Basic Metabolic Panel: Recent Labs  Lab 04/12/20 0016 04/12/20 0207 04/12/20 0930 04/13/20 0425 04/13/20 1833 04/14/20 0433 04/15/20 0649  NA   < >  --  138 138 139 139 137  K   < >  --  3.8 4.5 4.0 4.2 3.1*  CL   < >  --  104 106 109 110 103  CO2   < >  --  23 24 20* 24 26  GLUCOSE   < >  --  204* 121* 111* 136* 90  BUN   < >  --  18 20 13 11 9   CREATININE   < >  --  1.45* 1.35* 0.84 0.81 0.74  CALCIUM   < >  --  7.4* 7.9* 7.3* 7.2* 7.9*  MG  --  1.7 1.5*  --  2.0 1.9  --   PHOS  --   --  4.4  --  2.7 3.0 1.8*   < > = values in this interval not displayed.   GFR: Estimated Creatinine Clearance: 108.5 mL/min (by C-G formula based on SCr of 0.74 mg/dL). Liver Function Tests: Recent Labs  Lab 04/12/20 0930 04/13/20 0425 04/13/20 1833 04/14/20 0433 04/15/20 0649  AST 22 18 35 36 67*  ALT 10 10 13 13 21   ALKPHOS 78 76 74 64 61  BILITOT 1.2 0.6 1.3* 0.7 0.7  PROT 5.9* 6.0*  5.8* 5.2* 6.0*  ALBUMIN 2.5* 2.6* 2.5* 2.2* 2.4*   No results for input(s): LIPASE, AMYLASE in the last 168 hours. No results for input(s): AMMONIA in the last 168 hours. Coagulation Profile: Recent Labs  Lab 04/12/20 0207 04/13/20 0425  INR 1.2 1.1   Cardiac Enzymes: No results for input(s): CKTOTAL, CKMB, CKMBINDEX, TROPONINI in the last 168 hours. BNP (last 3 results) No results for input(s): PROBNP in the last 8760 hours. HbA1C: Recent Labs    04/13/20 0425  HGBA1C 5.7*   CBG: Recent Labs  Lab 04/14/20 2316 04/15/20 0529 04/15/20 0820 04/15/20 1103 04/15/20 1639  GLUCAP 86 97 93 84 93   Lipid Profile: Recent Labs    04/13/20 2132  TRIG 85   Thyroid Function Tests: No results for input(s): TSH, T4TOTAL, FREET4, T3FREE, THYROIDAB in the last 72  hours. Anemia Panel: No results for input(s): VITAMINB12, FOLATE, FERRITIN, TIBC, IRON, RETICCTPCT in the last 72 hours. Sepsis Labs: Recent Labs  Lab 04/12/20 0016 04/12/20 0207 04/12/20 0930 04/12/20 1219 04/13/20 0425 04/13/20 1833 04/13/20 2157  PROCALCITON 0.28  --   --   --  0.61  --   --   LATICACIDVEN 5.4*   < > 4.0* 2.6*  --  2.5* 0.9   < > = values in this interval not displayed.    Recent Results (from the past 240 hour(s))  Blood culture (routine x 2)     Status: None (Preliminary result)   Collection Time: 04/12/20  2:07 AM   Specimen: BLOOD  Result Value Ref Range Status   Specimen Description BLOOD LINE RIGHT HAND  Final   Special Requests   Final    BOTTLES DRAWN AEROBIC AND ANAEROBIC Blood Culture results may not be optimal due to an inadequate volume of blood received in culture bottles   Culture   Final    NO GROWTH 3 DAYS Performed at [facility], [address], [place], [place] [address]    Report Status PENDING  Incomplete  Blood culture (routine x 2)     Status: None (Preliminary result)   Collection Time: 04/12/20  2:07 AM   Specimen: BLOOD  Result Value Ref Range Status   Specimen Description BLOOD RIGHT HAND  Final   Special Requests   Final    BOTTLES DRAWN AEROBIC AND ANAEROBIC Blood Culture adequate volume   Culture   Final    NO GROWTH 3 DAYS Performed at [facility], [address] [place], [place] [address]    Report Status PENDING  Incomplete  Respiratory Panel by RT PCR (Flu A&B, Covid) - Nasopharyngeal Swab     Status: None   Collection Time: 04/12/20  2:07 AM   Specimen: Nasopharyngeal Swab  Result Value Ref Range Status   SARS Coronavirus 2 by RT PCR NEGATIVE NEGATIVE Final    Comment: (NOTE) SARS-CoV-2 target nucleic acids are NOT DETECTED.  The SARS-CoV-2 RNA is generally detectable in upper respiratoy specimens during the acute phase of infection. The lowest concentration of SARS-CoV-2 viral copies  this assay can detect is 131 copies/mL. A negative result does not preclude SARS-Cov-2 infection and should not be used as the sole basis for treatment or other patient management decisions. A negative result may occur with  improper specimen collection/handling, submission of specimen other than nasopharyngeal swab, presence of viral mutation(s) within the areas targeted by this assay, and inadequate number of viral copies (<131 copies/mL). A negative result must be combined with clinical observations,  patient history, and epidemiological information. The expected result is Negative.  Fact Sheet for Patients:  PinkCheek.be  Fact Sheet for Healthcare Providers:  GravelBags.it  This test is no t yet approved or cleared by the [place] FDA and  has been authorized for detection and/or diagnosis of SARS-CoV-2 by FDA under an Emergency Use Authorization (EUA). This EUA will remain  in effect (meaning this test can be used) for the duration of the COVID-19 declaration under Section 564(b)(1) of the Act, 21 U.S.C. section 360bbb-3(b)(1), unless the authorization is terminated or revoked sooner.     Influenza A by PCR NEGATIVE NEGATIVE Final   Influenza B by PCR NEGATIVE NEGATIVE Final    Comment: (NOTE) The Xpert Xpress SARS-CoV-2/FLU/RSV assay is intended as an aid in  the diagnosis of influenza from Nasopharyngeal swab specimens and  should not be used as a sole basis for treatment. Nasal washings and  aspirates are unacceptable for Xpert Xpress SARS-CoV-2/FLU/RSV  testing.  Fact Sheet for Patients: PinkCheek.be  Fact Sheet for Healthcare Providers: GravelBags.it  This test is not yet approved or cleared by the [place] FDA and  has been authorized for detection and/or diagnosis of SARS-CoV-2 by  FDA under an Emergency Use Authorization (EUA). This EUA  will remain  in effect (meaning this test can be used) for the duration of the  Covid-19 declaration under Section 564(b)(1) of the Act, 21  U.S.C. section 360bbb-3(b)(1), unless the authorization is  terminated or revoked. Performed at [facility], [address] [place], [place] [address]   MRSA PCR Screening     Status: None   Collection Time: 04/12/20  6:14 AM   Specimen: Nasopharyngeal  Result Value Ref Range Status   MRSA by PCR NEGATIVE NEGATIVE Final    Comment:        The GeneXpert MRSA Assay (FDA approved for NASAL specimens only), is one component of a comprehensive MRSA colonization surveillance program. It is not intended to diagnose MRSA infection nor to guide or monitor treatment for MRSA infections. Performed at [facility], [address] [place], [place] [address]       Imaging Studies   DG Chest [place] 1 View  Result Date: 04/13/2020 CLINICAL DATA:  Acute respiratory failure. EXAM: PORTABLE CHEST 1 VIEW COMPARISON:  Earlier today. FINDINGS: Endotracheal tube tip 2.4 cm from the carina. Enteric tube has been advanced, the side port is now below the diaphragm. Persistent opacity at the right lung base favoring right middle or lower lobar collapse/atelectasis. Increasing retrocardiac opacity. Mild cardiomegaly with unchanged mediastinal contours. There may be developing small pleural effusions. No pneumothorax. IMPRESSION: 1. Endotracheal tube tip 2.4 cm from the carina. Enteric tube has been advanced, side port now below the diaphragm. 2. Persistent right lung base opacity favoring right lower lobar collapse/atelectasis. Increasing retrocardiac opacity. Electronically Signed   By: Keith Rake M.D.   On: 04/13/2020 22:24   ECHOCARDIOGRAM COMPLETE  Result Date: 04/14/2020    ECHOCARDIOGRAM REPORT   Patient Name:   SHERMAINE BRIGHAM Date of Exam: 04/14/2020 Medical Rec #:  [id number]     Height:       71.0 in Accession #:    [id number]    Weight:        168.4 lb Date of Birth:  02-13-1961      BSA:          1.960 m Patient Age:    13 years      BP:  102/74 mmHg Patient Gender: M             HR:           65 bpm. Exam Location:  [facility] Procedure: 2D Echo Indications:     Non Sustained SVT  History:         Patient has no prior history of Echocardiogram examinations.                  Risk Factors:Hypertension and Current Smoker.  Sonographer:     L Thornton-Maynard Referring Phys:  [id number] DIEGO F PABON Diagnosing Phys: Kate Sable MD IMPRESSIONS  1. Left ventricular ejection fraction, by estimation, is 60 to 65%. The left ventricle has normal function. The left ventricle has no regional wall motion abnormalities. Left ventricular diastolic parameters were normal.  2. Right ventricular systolic function is normal. The right ventricular size is normal. There is normal pulmonary artery systolic pressure.  3. The mitral valve is normal in structure. No evidence of mitral valve regurgitation. No evidence of mitral stenosis.  4. The aortic valve is normal in structure. Aortic valve regurgitation is trivial. No aortic stenosis is present.  5. The inferior vena cava is normal in size with greater than 50% respiratory variability, suggesting right atrial pressure of 3 mmHg. FINDINGS  Left Ventricle: Left ventricular ejection fraction, by estimation, is 60 to 65%. The left ventricle has normal function. The left ventricle has no regional wall motion abnormalities. The left ventricular internal cavity size was normal in size. There is  no left ventricular hypertrophy. Left ventricular diastolic parameters were normal. Right Ventricle: The right ventricular size is normal. No increase in right ventricular wall thickness. Right ventricular systolic function is normal. There is normal pulmonary artery systolic pressure. The tricuspid regurgitant velocity is 2.43 m/s, and  with an assumed right atrial pressure of 3 mmHg, the estimated right ventricular systolic  pressure is 35.5 mmHg. Left Atrium: Left atrial size was normal in size. Right Atrium: Right atrial size was normal in size. Pericardium: There is no evidence of pericardial effusion. Mitral Valve: The mitral valve is normal in structure. No evidence of mitral valve regurgitation. No evidence of mitral valve stenosis. Tricuspid Valve: The tricuspid valve is normal in structure. Tricuspid valve regurgitation is not demonstrated. No evidence of tricuspid stenosis. Aortic Valve: The aortic valve is normal in structure. Aortic valve regurgitation is trivial. No aortic stenosis is present. Aortic valve mean gradient measures 6.0 mmHg. Aortic valve peak gradient measures 11.7 mmHg. Aortic valve area, by VTI measures 2.23 cm. Pulmonic Valve: The pulmonic valve was normal in structure. Pulmonic valve regurgitation is trivial. No evidence of pulmonic stenosis. Aorta: The aortic root is normal in size and structure. Venous: The inferior vena cava is normal in size with greater than 50% respiratory variability, suggesting right atrial pressure of 3 mmHg. IAS/Shunts: No atrial level shunt detected by color flow Doppler.  LEFT VENTRICLE PLAX 2D LVIDd:         4.89 cm  Diastology LVIDs:         2.87 cm  LV e' medial:    7.51 cm/s LV PW:         1.11 cm  LV E/e' medial:  7.7 LV IVS:        1.15 cm  LV e' lateral:   9.57 cm/s LVOT diam:     2.30 cm  LV E/e' lateral: 6.0 LV SV:         71 LV SV  Index:   36 LVOT Area:     4.15 cm  RIGHT VENTRICLE RV S prime:     20.50 cm/s TAPSE (M-mode): 3.4 cm LEFT ATRIUM             Index       RIGHT ATRIUM           Index LA diam:        3.00 cm 1.53 cm/m  RA Area:     18.60 cm LA Vol (A2C):   61.9 ml 31.58 ml/m RA Volume:   53.30 ml  27.19 ml/m LA Vol (A4C):   49.2 ml 25.10 ml/m LA Biplane Vol: 58.4 ml 29.79 ml/m  AORTIC VALVE                    PULMONIC VALVE AV Area (Vmax):    2.53 cm     PV Vmax:       0.98 m/s AV Area (Vmean):   2.39 cm     PV Peak grad:  3.8 mmHg AV Area (VTI):      2.23 cm AV Vmax:           171.00 cm/s AV Vmean:          119.000 cm/s AV VTI:            0.318 m AV Peak Grad:      11.7 mmHg AV Mean Grad:      6.0 mmHg LVOT Vmax:         104.00 cm/s LVOT Vmean:        68.500 cm/s LVOT VTI:          0.171 m LVOT/AV VTI ratio: 0.54  AORTA Ao Root diam: 3.60 cm MITRAL VALVE               TRICUSPID VALVE MV Area (PHT): 3.55 cm    TR Peak grad:   23.6 mmHg MV E velocity: 57.80 cm/s  TR Vmax:        243.00 cm/s MV A velocity: 51.40 cm/s MV E/A ratio:  1.12        SHUNTS                            Systemic VTI:  0.17 m                            Systemic Diam: 2.30 cm Kate Sable MD Electronically signed by Kate Sable MD Signature Date/Time: 04/14/2020/3:14:22 PM    Final      Medications   Scheduled Meds: . chlorhexidine gluconate (MEDLINE KIT)  15 mL Mouth Rinse BID  . Chlorhexidine Gluconate Cloth  6 each Topical Daily  . docusate  100 mg Oral BID  . folic acid  1 mg Oral Daily  . insulin aspart  0-9 Units Subcutaneous Q4H  . mouth rinse  15 mL Mouth Rinse 10 times per day  . multivitamin with minerals  1 tablet Oral Daily  . nicotine  21 mg Transdermal Daily  . pantoprazole (PROTONIX) IV  40 mg Intravenous Q24H  . [START ON 04/16/2020] polyethylene glycol  17 g Oral Daily  . potassium & sodium phosphates  2 packet Oral Q4H  . thiamine  100 mg Oral Daily   Continuous Infusions: . 0.9 % NaCl with KCl 40 mEq / L Stopped (04/15/20 1806)  . cefTRIAXone (ROCEPHIN)  IV  Stopped (04/14/20 2327)  . doxycycline (VIBRAMYCIN) IV Stopped (04/15/20 1427)  . metronidazole 100 mL/hr at 04/15/20 1900       LOS: 3 days    Time spent: 30 minutes with > 50% spent in coordination of care and direct patient contact.    Ezekiel Slocumb, DO [facility]  04/15/2020, 7:39 PM    If 7PM-7AM, please contact night-coverage. How to contact the [facility] Attending or Consulting provider [place] or covering provider during after hours [place], for this  patient?    1. Check the care team in [facility] and look for a) attending/consulting [facility] provider listed and b) the [facility] team listed 2. Log into [email or web address] and use 's universal password to access. If you do not have the password, please contact the hospital operator. 3. Locate the [facility] provider you are looking for under [facility] and page to a number that you can be directly reached. 4. If you still have difficulty reaching the provider, please page the [facility] (Director on Call) for the Hospitalists listed on amion for assistance.

## 2020-04-15 NOTE — Progress Notes (Signed)
Per MD, discontinue heart monitor and foley catheter.

## 2020-04-15 NOTE — Progress Notes (Signed)
OT Cancellation Note  Patient Details Name: Caleb Brooks MRN: [id number] DOB: [birth year]   Cancelled Treatment:    Reason Eval/Treat Not Completed: Medical issues which prohibited therapy;Patient not medically ready. Consult received, chart reviewed. Pt noted with progressive HTN overnight, most recently 178/115mmHg. Will hold OT until pt medically appropriate for OT evaluation.   Jeni Salles, MPH, MS, OTR/L ascom [phone] 04/15/20, 10:43 AM

## 2020-04-16 LAB — CBC WITH DIFFERENTIAL/PLATELET
Abs Immature Granulocytes: 0.15 10*3/uL — ABNORMAL HIGH (ref 0.00–0.07)
Basophils Absolute: 0 10*3/uL (ref 0.0–0.1)
Basophils Relative: 0 %
Eosinophils Absolute: 0.1 10*3/uL (ref 0.0–0.5)
Eosinophils Relative: 1 %
HCT: 36.8 % — ABNORMAL LOW (ref 39.0–52.0)
Hemoglobin: 12.4 g/dL — ABNORMAL LOW (ref 13.0–17.0)
Immature Granulocytes: 1 %
Lymphocytes Relative: 8 %
Lymphs Abs: 1.4 10*3/uL (ref 0.7–4.0)
MCH: 30.5 pg (ref 26.0–34.0)
MCHC: 33.7 g/dL (ref 30.0–36.0)
MCV: 90.6 fL (ref 80.0–100.0)
Monocytes Absolute: 2 10*3/uL — ABNORMAL HIGH (ref 0.1–1.0)
Monocytes Relative: 11 %
Neutro Abs: 15.4 10*3/uL — ABNORMAL HIGH (ref 1.7–7.7)
Neutrophils Relative %: 79 %
Platelets: 271 10*3/uL (ref 150–400)
RBC: 4.06 MIL/uL — ABNORMAL LOW (ref 4.22–5.81)
RDW: 14.6 % (ref 11.5–15.5)
WBC: 19.1 10*3/uL — ABNORMAL HIGH (ref 4.0–10.5)
nRBC: 2.2 % — ABNORMAL HIGH (ref 0.0–0.2)

## 2020-04-16 LAB — BASIC METABOLIC PANEL
Anion gap: 10 (ref 5–15)
BUN: 7 mg/dL (ref 6–20)
CO2: 28 mmol/L (ref 22–32)
Calcium: 8.1 mg/dL — ABNORMAL LOW (ref 8.9–10.3)
Chloride: 97 mmol/L — ABNORMAL LOW (ref 98–111)
Creatinine, Ser: 0.79 mg/dL (ref 0.61–1.24)
GFR, Estimated: >60 mL/min (ref >60)
Glucose, Bld: 87 mg/dL (ref 70–99)
Potassium: 3.1 mmol/L — ABNORMAL LOW (ref 3.5–5.1)
Sodium: 135 mmol/L (ref 135–145)

## 2020-04-16 LAB — GLUCOSE, CAPILLARY
Glucose-Capillary: 103 mg/dL — ABNORMAL HIGH (ref 70–99)
Glucose-Capillary: 76 mg/dL (ref 70–99)
Glucose-Capillary: 78 mg/dL (ref 70–99)
Glucose-Capillary: 83 mg/dL (ref 70–99)
Glucose-Capillary: 84 mg/dL (ref 70–99)
Glucose-Capillary: 90 mg/dL (ref 70–99)

## 2020-04-16 LAB — SURGICAL PATHOLOGY

## 2020-04-16 LAB — MAGNESIUM: Magnesium: 1.7 mg/dL (ref 1.7–2.4)

## 2020-04-16 LAB — PHOSPHORUS: Phosphorus: 2.7 mg/dL (ref 2.5–4.6)

## 2020-04-16 MED ORDER — AMLODIPINE BESYLATE 5 MG PO TABS
5.0000 mg | ORAL_TABLET | Freq: Every day | ORAL | Status: DC
  Administered 2020-04-16 – 2020-04-18 (×3): 5 mg via ORAL
  Filled 2020-04-16 (×3): qty 1

## 2020-04-16 MED ORDER — HYDRALAZINE HCL 25 MG PO TABS: 25.0000 mg | ORAL_TABLET | Freq: Four times a day (QID) | ORAL | Status: DC | PRN

## 2020-04-16 MED ORDER — OXYCODONE-ACETAMINOPHEN 5-325 MG PO TABS
1.0000 | ORAL_TABLET | ORAL | Status: DC | PRN
  Administered 2020-04-16 – 2020-04-18 (×8): 2 via ORAL
  Filled 2020-04-16 (×8): qty 2

## 2020-04-16 MED ORDER — METRONIDAZOLE 500 MG PO TABS
500.0000 mg | ORAL_TABLET | Freq: Three times a day (TID) | ORAL | Status: DC
  Administered 2020-04-17 – 2020-04-18 (×5): 500 mg via ORAL
  Filled 2020-04-16 (×6): qty 1

## 2020-04-16 MED ORDER — PREGABALIN 50 MG PO CAPS
50.0000 mg | ORAL_CAPSULE | Freq: Three times a day (TID) | ORAL | Status: DC
  Administered 2020-04-16 – 2020-04-18 (×7): 50 mg via ORAL
  Filled 2020-04-16 (×7): qty 1

## 2020-04-16 MED ORDER — MAGNESIUM SULFATE 2 GM/50ML IV SOLN
2.0000 g | Freq: Once | INTRAVENOUS | Status: AC
  Administered 2020-04-16: 2 g via INTRAVENOUS
  Filled 2020-04-16: qty 50

## 2020-04-16 MED ORDER — MORPHINE SULFATE (PF) 2 MG/ML IV SOLN: 2.0000 mg | INTRAVENOUS | Status: DC | PRN

## 2020-04-16 MED ORDER — POTASSIUM CHLORIDE 20 MEQ PO PACK
40.0000 meq | PACK | Freq: Once | ORAL | Status: AC
  Administered 2020-04-16: 40 meq via ORAL
  Filled 2020-04-16: qty 2

## 2020-04-16 NOTE — Evaluation (Signed)
Physical Therapy Evaluation Patient Details Name: Caleb Brooks MRN: [id number] DOB: [birth year] Today's Date: 04/16/2020   History of Present Illness  Pt is a 59 y.o. African-American male with a known history of hypertension, hidradenitis suppurativa complicated by recurrent boils and keloid formation anxiety and osteoarthritis as well as EtOH and tobacco abuse, presented to the emergency room with acute onset of syncope twice with associated lightheadedness and dizziness.  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    Clinical Impression  Pt with elevated BP but trending down.  Ok for pt to participate with PT services per MD.  Pt was pleasant and motivated to participate during the session but acknowledged feeling depressed in general regarding his situation.  Pt declined offer for referral to pastoral care or counseling.  Pt's BP in supine prior to session was 158/100.  Pt put forth good effort during the session and in general took extra time and effort to complete all functional tasks with very slow, cautious movements but did not require physical assistance at any point.  Pt was able to ambulate 60 feet with some fatigue noted near the end of the walk but SpO2 and HR were WNL with BP 163/106 again taken in supine at the end of the session.  Of note, SLP stepped in during session while patient was resting from ambulation for a brief swallow assessment.  Pt will benefit from HHPT services upon discharge to safely address deficits listed in patient problem list for decreased caregiver assistance and eventual return to PLOF.           Follow Up Recommendations Home health PT;Supervision - Intermittent    Equipment Recommendations  Rolling walker with 5" wheels    Recommendations for Other Services       Precautions / Restrictions Precautions Precautions: Fall Restrictions Weight Bearing Restrictions: No Other Position/Activity Restrictions: Abdominal incisions and JP drain in place      Mobility  Bed Mobility Overal bed mobility: Modified Independent             General bed mobility comments: Extra time and effort but no physical assistance needed    Transfers Overall transfer level: Needs assistance Equipment used: Rolling walker (2 wheeled) Transfers: Sit to/from Stand Sit to Stand: Min guard;From elevated surface         General transfer comment: Extra time and effort to stand but no physical assistance needed  Ambulation/Gait Ambulation/Gait assistance: Min guard Gait Distance (Feet): 60 Feet Assistive device: Rolling walker (2 wheeled) Gait Pattern/deviations: Step-through pattern;Decreased step length - right;Decreased step length - left;Trunk flexed Gait velocity: decreased   General Gait Details: Slow cadence but steady without LOB  Stairs            Wheelchair Mobility    Modified Rankin (Stroke Patients Only)       Balance Overall balance assessment: Needs assistance Sitting-balance support: Feet supported Sitting balance-Leahy Scale: Good     Standing balance support: Bilateral upper extremity supported;During functional activity Standing balance-Leahy Scale: Good Standing balance comment: Min lean on the RW for support                             Pertinent Vitals/Pain  Pain Assessment: 0-10 Pain Score: 7  Pain Location: stomach Pain Descriptors / Indicators: Aching;Sore Pain Intervention(s): Monitored during session;Premedicated before session;Patient requesting pain meds-RN notified    Home Living Family/patient expects to be discharged  to:: Private residence Living Arrangements: Parent Available Help at Discharge: Family;Available PRN/intermittently Type of Home: House Home Access: Stairs to enter Entrance Stairs-Rails: Right;Left;Can reach both Entrance Stairs-Number of Steps: 3 Home Layout: One level Home Equipment: Bedside commode;Cane - single point;Shower seat;Grab bars - toilet Additional Comments: 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, nephews assisting with mother while he is in acute care, may have intermittent assist from nephews at discharge    Prior Function Level of Independence: Independent         Comments: Ind amb community distances without an AD, no fall history, cares for his mother at home, Ind with ADLs     Hand Dominance        Extremity/Trunk Assessment   Upper Extremity Assessment Upper Extremity Assessment: Generalized weakness    Lower Extremity Assessment Lower Extremity Assessment: Generalized weakness       Communication   Communication: No difficulties  Cognition Arousal/Alertness: Awake/alert Behavior During Therapy: WFL for tasks assessed/performed Overall Cognitive Status: Within Functional Limits for tasks assessed                                        General Comments      Exercises Total Joint Exercises Ankle Circles/Pumps: AROM;Strengthening;Both;5 reps;10 reps Quad Sets: Strengthening;Both;5 reps;10 reps Gluteal Sets: Strengthening;Both;5 reps;10 reps Long Arc Quad: AROM;Strengthening;Both;10 reps Knee Flexion: AROM;Strengthening;Both;10 reps Marching in Standing: AROM;Strengthening;Both;5 reps;Standing Other Exercises Other Exercises: HEP education for BLE APs, QS, GS, and LAQ x 10 each every 1-2 hours daily   Assessment/Plan    PT Assessment Patient needs continued PT services  PT Problem List Decreased strength;Decreased activity tolerance;Decreased balance;Decreased mobility;Decreased knowledge of use of DME;Pain       PT  Treatment Interventions DME instruction;Gait training;Stair training;Functional mobility training;Therapeutic activities;Therapeutic exercise;Balance training;Patient/family education    PT Goals (Current goals can be found in the Care Plan section)  Acute Rehab PT Goals Patient Stated Goal: To get stronger PT Goal Formulation: With patient Time For Goal Achievement: 04/29/20 Potential to Achieve Goals: Good    Frequency Min 2X/week   Barriers to discharge        Co-evaluation               AM-PAC PT "6 Clicks" Mobility  Outcome Measure Help needed turning from your back to your side while in a flat bed without using bedrails?: A Little Help needed moving from lying on your back to sitting on the side of a flat bed without using bedrails?: A Little Help needed moving to and from a bed to a chair (including a wheelchair)?: A Little Help needed standing up from a chair using your arms (e.g., wheelchair or bedside chair)?: A Little Help needed to walk in hospital room?: A Little Help needed climbing 3-5 steps with a railing? : A Little 6 Click Score: 18    End of Session Equipment Utilized During Treatment: Gait belt;Other (comment) (Gait belt under arms to avoid abdominal incision/drain) Activity Tolerance: Patient tolerated treatment well Patient left: in bed;with call bell/phone within reach;with bed alarm set;Other (comment) (Pt declined up in chair) Nurse Communication: Mobility status PT Visit Diagnosis: Muscle weakness (generalized) (M62.81);Difficulty in walking, not  elsewhere classified (R26.2);Pain Pain - part of body:  (abdominal area)    Time: 8902-2840 PT Time Calculation (min) (ACUTE ONLY): 46 min   Charges:   PT Evaluation $PT Eval Moderate Complexity: 1 Mod PT Treatments $Therapeutic Exercise: 8-22 mins        D. Royetta Asal PT, DPT 04/16/20, 3:26 PM

## 2020-04-16 NOTE — Consult Note (Signed)
[facility] for Electrolyte Monitoring and Replacement   Recent Labs: Potassium (mmol/L)  Date Value  04/16/2020 3.1 (L)   Magnesium (mg/dL)  Date Value  04/16/2020 1.7   Calcium (mg/dL)  Date Value  04/16/2020 8.1 (L)   Albumin (g/dL)  Date Value  04/15/2020 2.4 (L)  03/14/2020 4.1   Phosphorus (mg/dL)  Date Value  04/16/2020 2.7   Sodium (mmol/L)  Date Value  04/16/2020 135  03/14/2020 140   Corrected calcium: 9.38 mg/dL   Assessment: Patient is a 59 y/o M with medical history including hypertension, hidradenitis suppurativa, EtOH and tobacco abuse who is admitted with splenic hemorrhage and perineal cellulitis. Patient is now POD # 3 from exploratory laparotomy with splenectomy. Pharmacy has been consulted to assist with electrolyte monitoring and replacement.   Diet: regular MIVF:  NS + 40 mEq K/L at 75 mL/hr (72 mEq K/day)   Goal of Therapy:  Electrolytes within normal limits  Plan:   2 grams IV magnesium sulfate  Continue NS + 40 mEq K/L at 75 mL/hr (72 mEq K/day)  40 mEq oral KCl x 1  Re-check all electrolytes with AM labs  Dallie Piles 04/16/2020 7:28 AM

## 2020-04-16 NOTE — Progress Notes (Signed)
PROGRESS NOTE    Caleb Brooks   NKN:[id number]  DOB: [birth year]  PCP: Langston Reusing, NP    DOA: 04/12/2020 LOS: 4   Brief Narrative   Caleb Brooks  is a 59 y.o. African-American male with a known history of hypertension, hidradenitis suppurativa complicated by recurrent boils and keloid formation anxiety and osteoarthritis as well as EtOH and tobacco abuse who presented to the ED on 04/12/2020 after two syncopal episodes.  Found hypotensive with BP 54/30 by EMS.  Had a fall few days prior with progressive left upper quadrant pain since.  Also with recent purulent drainage, pain and swelling of perineal boils.  Admitted to ICU/stepdown for hemorrhagic shock secondary to splenic hemorrhage with hemoperitoneum.  Placed on IV antibiotics for apparent infected boils secondary to HS.    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  Patient remained intubated after surgery, care assumed by PCCM.  Transfused several units pRBC's and Cell saver utilized during surgery.    TRH re-assumed care on 11/8.  Patient transferred out of ICU, stable and doing well overall.  Diet being advanced.  Remains on IV antibiotics.         Assessment & Plan   Active Problems:   Splenic hemorrhage   Hemoperitoneum   Acute splenic hemorrhage with subsequent hemorrhagic shockand syncope x2  Acute Blood Loss Anemia  Hemoperitoneum General surgery, vascular surgery consulted. 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 --will need post-splenectomy vaccinations -- analgesia & bowel regimen PRN --advance diet per surgery --on Rocephin, Flagyl - continue --PT / mobilize --transfer to med/surg --monitor CBC, CMP, Mg, Phos  Electrolyte abnormalities - hypokalemia, hypophosphatemia,  - pharmacy consulted for replacement  Perineal severe suppurative cellulitis with history of  hidradenitis suppurativa Suspect primary reason for shock was from acute blood loss anemia rather than sepsis, though pt notes worsening drainage recently  -- continue current antibiotics -- appreciate general surgery c/s - recommending outpatient follow up for HS -- follow blood cultures  Leukocytosis: likely reactive, and related to cellulitis.  On antibiotics as above.  Monitor CBC.  Acute Metabolic Encephalopathy: POA, resolved.  suspect 2/2 sedating medications and acute illness.   Monitor.  NSVT: Echo normal on 11/7.  Telemetry.  Monitor and replace electrolytes.  AKI: 2/2 hemorrhagic shock, follow - improved  Abnormal CXR: possible atypical infection.  No respiratory symptoms today (11/8).  Already on abx.  Negative covid/flu.  Possible Liver Disease vs Cirrhosis LFT's wnl, follow outpatient.  Normal INR, bili, platelets.  Tobacco abuse. Counseled on cessation.  Alcohol abuse. Monitored for withdrawal.  Given IV banana bag.  PRN Ativan per CIWA.  No issues reported thus far.   DVT prophylaxis: Place and maintain sequential compression device Start: 04/13/20 1834 SCDs Start: 04/12/20 0516   Diet:  Diet Orders (From admission, onward)    Start     Ordered   04/16/20 1031  Diet regular Room service appropriate? Yes; Fluid consistency: Thin  Diet effective now       Question Answer Comment  Room service appropriate? Yes   Fluid consistency: Thin      04/16/20 1030            Code Status: Full Code    Subjective 04/16/20    Pt seen with a friend at bedside today.  He continues having burning pain at surgical site.  Pain about the same as yesterday.  Ready to try solid foods and wants to get up walking today.     Disposition Plan & Communication   Status is: Inpatient  Inpatient status remains appropriate because of severity of illness, requiring IV medications, advancing diet post-op, therapy evaluations pending  Dispo: The patient is from: home               Anticipated d/c is to: home              Anticipated d/c date is: 3 days              Patient currently is not medically stable for discharge.  Family Communication: Friend at bedside on rounds today   Consults, Procedures, Significant Events   Consultants:   General surgery  PCCM  Procedures:  11/5 - Embolization splenic art 11/6 - ex-lap and splenectomy  Antimicrobials:  Anti-infectives (From admission, onward)   Start     Dose/Rate Route Frequency Ordered Stop   04/17/20 0000  metroNIDAZOLE (FLAGYL) tablet 500 mg        500 mg Oral Every 8 hours 04/16/20 1717     04/13/20 1200  doxycycline (VIBRAMYCIN) 100 mg in sodium chloride 0.9 % 250 mL IVPB        100 mg 125 mL/hr over 120 Minutes Intravenous Every 12 hours 04/13/20 1112     04/12/20 2200  cefTRIAXone (ROCEPHIN) 2 g in sodium chloride 0.9 % 100 mL IVPB        2 g 200 mL/hr over 30 Minutes Intravenous Every 24 hours 04/12/20 1530     04/12/20 2200  metroNIDAZOLE (FLAGYL) IVPB 500 mg  Status:  Discontinued        500 mg 100 mL/hr over 60 Minutes Intravenous Every 8 hours 04/12/20 1530 04/16/20 1717   04/12/20 1300  vancomycin (VANCOREADY) IVPB 750 mg/150 mL  Status:  Discontinued        750 mg 150 mL/hr over 60 Minutes Intravenous Every 12 hours 04/12/20 0544 04/13/20 1112   04/12/20 0600  meropenem (MERREM) 1 g in sodium chloride 0.9 % 100 mL IVPB  Status:  Discontinued        1 g 200 mL/hr over 30 Minutes Intravenous Every 8 hours 04/12/20 0526 04/12/20 1530   04/12/20 0545  vancomycin (VANCOREADY) IVPB 750 mg/150 mL        750 mg 150 mL/hr over 60 Minutes Intravenous  Once 04/12/20 0537 04/12/20 0949   04/12/20 0530  vancomycin (VANCOCIN) IVPB 1000 mg/200 mL premix  Status:  Discontinued        1,000 mg 200 mL/hr over 60 Minutes Intravenous  Once 04/12/20 0526 04/12/20 0650   04/12/20 0524  ceFAZolin (ANCEF) IVPB 2g/100 mL premix        2 g 200 mL/hr over 30 Minutes Intravenous 30 min pre-op 04/12/20  0524 04/12/20 0554   04/12/20 0030  piperacillin-tazobactam (ZOSYN) IVPB 3.375 g        3.375 g 100 mL/hr over 30 Minutes Intravenous  Once 04/12/20 0021 04/12/20 0213   04/12/20 0030  vancomycin (VANCOCIN) IVPB 1000 mg/200 mL premix        1,000 mg 200 mL/hr over 60 Minutes Intravenous  Once 04/12/20 0021 04/12/20 0236       Significant Events: 04/12/2020 - admit  11/6 - surgery, continued intubated postop 11/8 - transfer out of ICU  Objective   Vitals:   04/16/20 0321 04/16/20 0733 04/16/20 1121 04/16/20 1526  BP: (!) 160/107 (!) 156/102 (!) 168/108 (!) 146/97  Pulse: 67 64 69 69  Resp: _0 Temp: 98.7 F (37.1 C) 98.5 F (36.9 C) 98.3 F (36.8 C) 97.7 F (36.5 C)  TempSrc: Oral Oral    SpO2: 97% 97% 93% 93%  Weight:      Height:        Intake/Output Summary (Last 24 hours) at 04/16/2020 1933 Last data filed at 04/16/2020 1500 Gross per 24 hour  Intake 1223.7 ml  Output 2530 ml  Net -1306.3 ml   Filed Weights   04/12/20 0013 04/12/20 0620  Weight: 74.8 kg 76.4 kg    Physical Exam:  General exam: awake, alert, no acute distress Respiratory system: CTAB, normal respiratory effort, on room air. Cardiovascular system: normal S1/S2,  RRR, no pedal edema.   Gastrointestinal system: Soft, non-distended, stable incisional tenderness. Central nervous system: A&O x3. no gross focal neurologic deficits, normal speech Skin: Abdominal incision site with staples intact no surrounding warmth or erythema, no drainage   Labs   Data Reviewed: I have personally reviewed following labs and imaging studies  CBC: Recent Labs  Lab 04/12/20 0016 04/12/20 0016 04/12/20 0930 04/12/20 1612 04/13/20 1833 04/14/20 0021 04/14/20 0433 04/15/20 0649 04/16/20 0435  WBC 15.9*   < > 19.9*   < > 28.1* 23.6* 20.6* 19.2* 19.1*  NEUTROABS 13.4*  --  17.0*  --   --   --  18.4*  --  15.4*  HGB 10.9*   < > 9.9*   < > 13.1 11.1* 10.7* 10.7* 12.4*  HCT 33.5*   < > 30.7*   < > 39.4  32.2* 31.8* 32.3* 36.8*  MCV 96.3   < > 91.4   < > 92.3 90.2 90.9 91.0 90.6  PLT 292   < > 217   < > 159 163 169 210 271   < > = values in this interval not displayed.   Basic Metabolic Panel: Recent Labs  Lab 04/12/20 0016 04/12/20 0207 04/12/20 0930 04/12/20 0930 04/13/20 0425 04/13/20 1833 04/14/20 0433 04/15/20 0649 04/16/20 0435  NA   < >  --  138   < > 138 139 139 137 135  K   < >  --  3.8   < > 4.5 4.0 4.2 3.1* 3.1*  CL   < >  --  104   < > 106 109 110 103 97*  CO2   < >  --  23   < > 24 20* _1 GLUCOSE   < >  --  204*   < > 121* 111* 136* 90 87  BUN   < >  --  18   < > _2 CREATININE   < >  --  1.45*   < > 1.35* 0.84 0.81 0.74 0.79  CALCIUM   < >  --  7.4*   < > 7.9* 7.3* 7.2* 7.9* 8.1*  MG  --  1.7 1.5*  --   --  2.0 1.9  --  1.7  PHOS  --   --  4.4  --   --  2.7 3.0 1.8* 2.7   < > = values in this interval not displayed.   GFR: Estimated Creatinine Clearance: 108.5 mL/min (by C-G formula based on SCr of 0.79 mg/dL). Liver Function Tests: Recent Labs  Lab 04/12/20 0930 04/13/20 0425 04/13/20 1833 04/14/20 0433 04/15/20 0649  AST  22 18 35 36 67*  ALT _0 ALKPHOS 78 76 74 64 61  BILITOT 1.2 0.6 1.3* 0.7 0.7  PROT 5.9* 6.0* 5.8* 5.2* 6.0*  ALBUMIN 2.5* 2.6* 2.5* 2.2* 2.4*   No results for input(s): LIPASE, AMYLASE in the last 168 hours. No results for input(s): AMMONIA in the last 168 hours. Coagulation Profile: Recent Labs  Lab 04/12/20 0207 04/13/20 0425  INR 1.2 1.1   Cardiac Enzymes: No results for input(s): CKTOTAL, CKMB, CKMBINDEX, TROPONINI in the last 168 hours. BNP (last 3 results) No results for input(s): PROBNP in the last 8760 hours. HbA1C: No results for input(s): HGBA1C in the last 72 hours. CBG: Recent Labs  Lab 04/16/20 0005 04/16/20 0316 04/16/20 0719 04/16/20 1120 04/16/20 1610  GLUCAP 90 84 78 83 76   Lipid Profile: Recent Labs    04/13/20 2132  TRIG 85   Thyroid Function Tests: No  results for input(s): TSH, T4TOTAL, FREET4, T3FREE, THYROIDAB in the last 72 hours. Anemia Panel: No results for input(s): VITAMINB12, FOLATE, FERRITIN, TIBC, IRON, RETICCTPCT in the last 72 hours. Sepsis Labs: Recent Labs  Lab 04/12/20 0016 04/12/20 0207 04/12/20 0930 04/12/20 1219 04/13/20 0425 04/13/20 1833 04/13/20 2157  PROCALCITON 0.28  --   --   --  0.61  --   --   LATICACIDVEN 5.4*   < > 4.0* 2.6*  --  2.5* 0.9   < > = values in this interval not displayed.    Recent Results (from the past 240 hour(s))  Blood culture (routine x 2)     Status: None (Preliminary result)   Collection Time: 04/12/20  2:07 AM   Specimen: BLOOD  Result Value Ref Range Status   Specimen Description BLOOD LINE RIGHT HAND  Final   Special Requests   Final    BOTTLES DRAWN AEROBIC AND ANAEROBIC Blood Culture results may not be optimal due to an inadequate volume of blood received in culture bottles   Culture   Final    NO GROWTH 4 DAYS Performed at [facility], [address], [place], [place] [address]    Report Status PENDING  Incomplete  Blood culture (routine x 2)     Status: None (Preliminary result)   Collection Time: 04/12/20  2:07 AM   Specimen: BLOOD  Result Value Ref Range Status   Specimen Description BLOOD RIGHT HAND  Final   Special Requests   Final    BOTTLES DRAWN AEROBIC AND ANAEROBIC Blood Culture adequate volume   Culture   Final    NO GROWTH 4 DAYS Performed at [facility], [address], [place], [place] [address]    Report Status PENDING  Incomplete  Respiratory Panel by RT PCR (Flu A&B, Covid) - Nasopharyngeal Swab     Status: None   Collection Time: 04/12/20  2:07 AM   Specimen: Nasopharyngeal Swab  Result Value Ref Range Status   SARS Coronavirus 2 by RT PCR NEGATIVE NEGATIVE Final    Comment: (NOTE) SARS-CoV-2 target nucleic acids are NOT DETECTED.  The SARS-CoV-2 RNA is generally detectable in upper respiratoy specimens during the  acute phase of infection. The lowest concentration of SARS-CoV-2 viral copies this assay can detect is 131 copies/mL. A negative result does not preclude SARS-Cov-2 infection and should not be used as the sole basis for treatment or other patient management decisions. A negative result may occur with  improper specimen collection/handling, submission of specimen other than nasopharyngeal swab,  presence of viral mutation(s) within the areas targeted by this assay, and inadequate number of viral copies (<131 copies/mL). A negative result must be combined with clinical observations, patient history, and epidemiological information. The expected result is Negative.  Fact Sheet for Patients:  PinkCheek.be  Fact Sheet for Healthcare Providers:  GravelBags.it  This test is no t yet approved or cleared by the [place] FDA and  has been authorized for detection and/or diagnosis of SARS-CoV-2 by FDA under an Emergency Use Authorization (EUA). This EUA will remain  in effect (meaning this test can be used) for the duration of the COVID-19 declaration under Section 564(b)(1) of the Act, 21 U.S.C. section 360bbb-3(b)(1), unless the authorization is terminated or revoked sooner.     Influenza A by PCR NEGATIVE NEGATIVE Final   Influenza B by PCR NEGATIVE NEGATIVE Final    Comment: (NOTE) The Xpert Xpress SARS-CoV-2/FLU/RSV assay is intended as an aid in  the diagnosis of influenza from Nasopharyngeal swab specimens and  should not be used as a sole basis for treatment. Nasal washings and  aspirates are unacceptable for Xpert Xpress SARS-CoV-2/FLU/RSV  testing.  Fact Sheet for Patients: PinkCheek.be  Fact Sheet for Healthcare Providers: GravelBags.it  This test is not yet approved or cleared by the [place] FDA and  has been authorized for detection and/or diagnosis  of SARS-CoV-2 by  FDA under an Emergency Use Authorization (EUA). This EUA will remain  in effect (meaning this test can be used) for the duration of the  Covid-19 declaration under Section 564(b)(1) of the Act, 21  U.S.C. section 360bbb-3(b)(1), unless the authorization is  terminated or revoked. Performed at [facility], [address] [place], [place] [address]   MRSA PCR Screening     Status: None   Collection Time: 04/12/20  6:14 AM   Specimen: Nasopharyngeal  Result Value Ref Range Status   MRSA by PCR NEGATIVE NEGATIVE Final    Comment:        The GeneXpert MRSA Assay (FDA approved for NASAL specimens only), is one component of a comprehensive MRSA colonization surveillance program. It is not intended to diagnose MRSA infection nor to guide or monitor treatment for MRSA infections. Performed at [facility], [address], [place], [place] [address]       Imaging Studies   No results found.   Medications   Scheduled Meds: . amLODipine  5 mg Oral Daily  . chlorhexidine gluconate (MEDLINE KIT)  15 mL Mouth Rinse BID  . Chlorhexidine Gluconate Cloth  6 each Topical Daily  . docusate  100 mg Oral BID  . folic acid  1 mg Oral Daily  . insulin aspart  0-9 Units Subcutaneous Q4H  . mouth rinse  15 mL Mouth Rinse 10 times per day  . [START ON 04/17/2020] metroNIDAZOLE  500 mg Oral Q8H  . multivitamin with minerals  1 tablet Oral Daily  . nicotine  21 mg Transdermal Daily  . pantoprazole (PROTONIX) IV  40 mg Intravenous Q24H  . polyethylene glycol  17 g Oral Daily  . pregabalin  50 mg Oral TID  . thiamine  100 mg Oral Daily   Continuous Infusions: . 0.9 % NaCl with KCl 40 mEq / L Stopped (04/16/20 1315)  . cefTRIAXone (ROCEPHIN)  IV Stopped (04/16/20 0005)  . doxycycline (VIBRAMYCIN) IV 125 mL/hr at 04/16/20 1500       LOS: 4 days    Time spent: 30 minutes with > 50% spent  in coordination of care and direct patient  contact.    Ezekiel Slocumb, DO [facility]  04/16/2020, 7:33 PM    If 7PM-7AM, please contact night-coverage. How to contact the [facility] Attending or Consulting provider [place] or covering provider during after hours [place], for this patient?    1. Check the care team in [facility] and look for a) attending/consulting TRH provider listed and b) the [facility] team listed 2. Log into [email or web address] and use [facility]'s universal password to access. If you do not have the password, please contact the hospital operator. 3. Locate the [facility] provider you are looking for under [facility] and page to a number that you can be directly reached. 4. If you still have difficulty reaching the provider, please page the [facility] (Director on Call) for the Hospitalists listed on amion for assistance.

## 2020-04-16 NOTE — Progress Notes (Addendum)
Speech Language Pathology Treatment: Dysphagia  Patient Details Name: Caleb Brooks MRN: [id number] DOB: [birth year] Today's Date: 04/16/2020 Time: 1300-1330 SLP Time Calculation (min) (ACUTE ONLY): 30 min     Assessment / Plan / Recommendation Clinical Impression  Pt was seen bedside for f/u toleration of diet-- Regular w/ thin liquids. No overt clinical s/s of aspiration were noted. Pt was in room w/ Physical Therapy upon arrival; alert & cooperative throughout. Of note, pt's oral diet had recently been upgraded to Regular per MD. Pt on RA, afebrile.  Pt required full assist for upright positioning in bed. Informed clinicians of abdominal discomfort (NSG notified per PT), which he rated as a 9/10 on the pain scale to PT in room. Pt was assessed directly w/ PO's of thin liquids via straw ~10x, and solids ~3x; No Overt Clinical s/s of aspiration noted. Pt was able to self-feed all trial consistencies w/ set up assist. Assessment was limited d/t pt abdominal/stomach discomfort-- pt expressed he "did not feel like eating much" at the time of the assessment, so PO trials were limited-- pt able to take very small bites of graham crackers. Pt stated he had also eaten fries and a bite of his hamburger (Lunch meal on table) w/ no oropharyngeal phase deficits described. Oral phase WFL w/ graham crackers: oral prep WFL, timely A-P transfer of bolus, mastication WFL, no oral residue. Vocal quality clear between all PO trials. No pharyngeal s/s of aspiration noted.   Recommend continue Regular diet w/ thin liquids at this time as MD ordered. Recommended easy to eat foods of his liking. Recommend meds whole in puree for safer swallowing, or with water as tolerated. Pt appears at reduced risk for aspiration following general aspiration precautions. Straws OK. Pt left in room w/ PT. Education provided on general aspiration precautions. NSG/MD updated. NSG to reconsult for further new needs. ST services to sign off  at this time.         HPI HPI: Per admitting H & P "Danell Verno  is a 59 y.o. African-American male with a known history of hypertension, hidradenitis suppurativa complicated by recurrent boils and keloid formation anxiety and osteoarthritis as well as EtOH and tobacco abuse, presented to the emergency room with acute onset of syncope twice with associated lightheadedness and dizziness.  The patient was noted to be hypotensive with a BP of 54/30 by EMS.  He has been having purulent drainage from his perineal boils for a few days with associated swelling and severe pain that has been constant.  He admits to left upper quadrant abdominal pain after having a fall on his side.  No nausea or vomiting or melena or bright red bleeding per rectum.  No dyspnea or cough or wheezing or hemoptysis.  No other bleeding diathesis.  No chest pain or palpitations"      SLP Plan  All goals met       Recommendations  Diet recommendations: Regular;Thin liquid Liquids provided via: Straw Medication Administration: Whole meds with liquid Compensations: Slow rate;Small sips/bites                Oral Care Recommendations: Oral care BID Follow up Recommendations: None SLP Visit Diagnosis: Dysphagia, oral phase (R13.11) Plan: All goals met       GO                Caleb Brooks  Graduate Clinician 04/16/2020, 2:41 PM  The information in this patient note, response to treatment, and overall treatment  plan developed has been reviewed and agreed upon after reviewing documentation. This session was performed under the supervision of this licensed clinician.    Orinda Kenner, MS, [place] Speech Language Pathologist Re[facility] [phone]

## 2020-04-16 NOTE — Progress Notes (Signed)
Patient's Rt arm IV leaking. Fluids were stopped and IV team consult was placed. IV team unsuccessful with placing new IV. Requested team on night shift to try again. MD notified.

## 2020-04-16 NOTE — Progress Notes (Signed)
Patient cannot tolerated AB Flagyl that was infusing. Patient complained of burning at IV site Lt wrist. Lt IV site was removed and changed to Rt FA IV. Patient still complaining of burning. IV AB was stopped and provider notified. Requested AB in pill form.  Thresa Ross, RN

## 2020-04-16 NOTE — Progress Notes (Addendum)
Patient right arm IV leaking. IV access team was notified and order was placed for new IV.   Thresa Ross, RN

## 2020-04-16 NOTE — TOC Progression Note (Signed)
Transition of Care [facility]) - Progression Note    Patient Details  Name: Caleb Brooks MRN: [id number] Date of Birth: December 02, 1960  [facility]) CM/SW Contact  Beverly Sessions, RN Phone Number: 04/16/2020, 2:16 PM  Clinical Narrative:     PT/OT pending Patient self pay. TOC following for discharge needs TOC consult for substance abuse.  Patient sleeping at this time.  Will re-attempt to provide resources and complete full assessment at a later time       Expected Discharge Plan and Services                                                 Social Determinants of Health (SDOH) Interventions    Readmission Risk Interventions No flowsheet data found.

## 2020-04-16 NOTE — Progress Notes (Addendum)
[facility] Day(s): 4.   Post op day(s): 3 Days Post-Op.   Interval History:  Patient seen and examined no acute events or new complaints overnight, transferred to floor yesterday afternoon.  Patient reports he is doing okay aside from incisional soreness and burning No nausea, emesis, fever, chills Expected leukocytosis in setting of splenectomy, stable, 19.1K Hgb continues to improve, up to 12.4 Renal function normal, sCr - 0.79, UO - 4.6L Mild hypokalemia to 3.1 o/w no other appreciable electrolyte derangements  Surgical drain with 530 ccs out; serous Diet advanced to full liquids; tolerating well Having bowel function Therapies on board but have not evaluated secondary to HTN  Vital signs in last 24 hours: [min-max] current  Temp:  [97.3 F (36.3 C)-99.2 F (37.3 C)] 98.7 F (37.1 C) (11/09 0321) Pulse Rate:  [66-77] 67 (11/09 0321) Resp:  [12-25] 16 (11/09 0321) BP: (156-188)/(93-115) 160/107 (11/09 0321) SpO2:  [91 %-99 %] 97 % (11/09 0321)     Height: 5\' 11"  (180.3 cm) Weight: 76.4 kg BMI (Calculated): 23.5   Intake/Output last 2 shifts:  11/08 0701 - 11/09 0700 In: 738.3 [I.V.:523.7; IV Piggyback:214.6] Out: 6226 [Urine:4650; Drains:530]   Physical Exam:  Constitutional: alert, cooperative and no distress  Respiratory: breathing non-labored at rest  Cardiovascular: regular rate and sinus rhythm  Gastrointestinal: Soft, incisional soreness, mild distension, no rebound/guarding, surgical drain in LUQ with serosanguinous output Integumentary: Laparotomy incision in CDI with staples, no erythema or drainage   Labs:  CBC Latest Ref Rng & Units 04/16/2020 04/15/2020 04/14/2020  WBC 4.0 - 10.5 K/uL 19.1(H) 19.2(H) 20.6(H)  Hemoglobin 13.0 - 17.0 g/dL 12.4(L) 10.7(L) 10.7(L)  Hematocrit 39 - 52 % 36.8(L) 32.3(L) 31.8(L)  Platelets 150 - 400 K/uL 271 210 169   CMP Latest Ref Rng & Units 04/16/2020 04/15/2020 04/14/2020   Glucose 70 - 99 mg/dL 87 90 136(H)  BUN 6 - 20 mg/dL 7 9 11   Creatinine 0.61 - 1.24 mg/dL 0.79 0.74 0.81  Sodium 135 - 145 mmol/L 135 137 139  Potassium 3.5 - 5.1 mmol/L 3.1(L) 3.1(L) 4.2  Chloride 98 - 111 mmol/L 97(L) 103 110  CO2 22 - 32 mmol/L 28 26 24   Calcium 8.9 - 10.3 mg/dL 8.1(L) 7.9(L) 7.2(L)  Total Protein 6.5 - 8.1 g/dL - 6.0(L) 5.2(L)  Total Bilirubin 0.3 - 1.2 mg/dL - 0.7 0.7  Alkaline Phos 38 - 126 U/L - 61 64  AST 15 - 41 U/L - 67(H) 36  ALT 0 - 44 U/L - 21 13     Imaging studies: No new pertinent imaging studies   Assessment/Plan: 59 y.o. 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   - Okay to continue to ADAT; wean IVF resuscitation   - will need splenectomy vaccines  - Monitor H&H; stable  - Replete K+; monitor             - Monitor abdominal examination             - Continue IV Abx (Rocephin + Flagyl)              - Pain control prn; antiemetics prn   - Added Lyrica 50 mg TID for "burning" pain at incision   - Okay to mobilize with therapies; follow up recommendations             - Further management per  primary service; we will follow    All of the above findings and recommendations were discussed with the patient, and the medical team, and all of patient's questions were answered to his expressed satisfaction.  -- Caleb Simon, PA-C [facility] 04/16/2020, 7:05 AM [phone] M-F: 7am - 4pm  I saw and evaluated the patient.  I agree with the above documentation, exam, and plan, which I have edited where appropriate. Caleb Brooks  12:53 PM

## 2020-04-16 NOTE — Evaluation (Signed)
Occupational Therapy Evaluation Patient Details Name: Caleb Brooks MRN: [id number] DOB: Aug 24, 1960 Today's Date: 04/16/2020    History of Present Illness Pt is a 59 y.o. African-American male with a known history of hypertension, hidradenitis suppurativa complicated by recurrent boils and keloid formation anxiety and osteoarthritis as well as EtOH and tobacco abuse, presented to the emergency room with acute onset of syncope twice with associated lightheadedness and dizziness.  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   Clinical Impression   Pt seen for abbreviated OT evaluation this date; limited by abdominal pain. Pt declined OOB 2/2 pain, endorsing 8/10 abdominal pain in bed at rest. Pt declined OT to notify RN, but at end of the session pt requesting therapist ask RN for "his shot." RN notified. 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 Pt reports his mother is a Administrator, arts and gave OT permission to notify PACE therapists to ensure proper supports in place for pt's mother during pt's recovery. Pt typically independent but now demonstrates difficulty with LB ADL and mobility 2/2 abdominal pain. Pt educated in AE/DME to improve LB ADL access. Pt verbalized understanding but declined to trial use of AE 2/2 pain. Pt will benefit from skilled OT services to maximize return to PLOF and independence including caregiving duties for his mother. Recommend HHOT upon discharge.     Follow Up Recommendations  Home health OT     Equipment Recommendations  None recommended by OT    Recommendations for Other Services       Precautions / Restrictions Precautions Precautions: Fall Restrictions Weight Bearing Restrictions: No Other Position/Activity Restrictions: Abdominal incisions and JP drain in place      Mobility Bed Mobility Overal bed mobility: Modified Independent             General bed mobility comments: deferred 2/2 pain    Transfers Overall transfer level: Needs assistance Equipment used: Rolling walker (2 wheeled) Transfers: Sit to/from Stand Sit to Stand: Min guard;From elevated surface         General transfer comment: deferred 2/2 pain    Balance Overall balance assessment: Needs assistance Sitting-balance support: Feet supported Sitting balance-Leahy Scale: Good     Standing balance support: Bilateral upper extremity supported;During functional activity Standing balance-Leahy Scale: Good Standing balance comment: Min lean on the RW for support                           ADL either performed or assessed with clinical judgement   ADL Overall ADL's : Needs assistance/impaired                                       General ADL Comments: Anticipate need for at least Min A for LB ADL tasks 2/2 weakness and abdominal pain with trunk flexion, CGA for ADL transfers based on PT Evaluation     Vision Patient Visual Report: No change from baseline       Perception     Praxis      Pertinent  Vitals/Pain Pain Assessment: 0-10 Pain Score: 8  Pain Location: stomach Pain Descriptors / Indicators: Aching;Sore Pain Intervention(s): Limited activity within patient's tolerance;Monitored during session;Patient requesting pain meds-RN notified     Hand Dominance     Extremity/Trunk Assessment Upper Extremity Assessment Upper Extremity Assessment: Generalized weakness   Lower Extremity Assessment Lower Extremity Assessment: Generalized weakness        Communication Communication Communication: No difficulties   Cognition Arousal/Alertness: Awake/alert Behavior During Therapy: Flat affect Overall Cognitive Status: Within Functional Limits for tasks assessed                                     General Comments       Exercises Total Joint Exercises Ankle Circles/Pumps: AROM;Strengthening;Both;5 reps;10 reps Quad Sets: Strengthening;Both;5 reps;10 reps Gluteal Sets: Strengthening;Both;5 reps;10 reps Long Arc Quad: AROM;Strengthening;Both;10 reps Knee Flexion: AROM;Strengthening;Both;10 reps Marching in Standing: AROM;Strengthening;Both;5 reps;Standing Other Exercises Other Exercises: HEP education for BLE APs, QS, GS, and LAQ x 10 each every 1-2 hours daily Other Exercises: Pt educated in AE for LB ADL tasks to minimize bending/abdominal discomfort and increase pt's independence; pt verbalized understanding but politely declined to trial during session 2/2 abdominal pain   Shoulder Instructions      Home Living Family/patient expects to be discharged to:: Private residence Living Arrangements: Parent Available Help at Discharge: Family;Available PRN/intermittently Type of Home: House Home Access: Stairs to enter [employer] of Steps: 3 Entrance Stairs-Rails: Right;Left;Can reach both Home Layout: One level     Bathroom Shower/Tub: Teacher, early years/pre: Standard     Home Equipment: Bedside commode;Cane - single point;Shower seat;Grab bars - toilet   Additional Comments: Pt is caregiver for 73+ yo mother, nephews assisting with mother while he is in acute care, may have intermittent assist from nephews at discharge      Prior Functioning/Environment Level of Independence: Independent        Comments: Ind amb community distances without an AD, no fall history, cares for his mother at home, Ind with ADLs        OT Problem List: Decreased strength;Pain;Impaired balance  (sitting and/or standing);Decreased knowledge of use of DME or AE      OT Treatment/Interventions: Self-care/ADL training;Therapeutic exercise;Therapeutic activities;DME and/or AE instruction;Energy conservation;Patient/family education;Balance training    OT Goals(Current goals can be found in the care plan section) Acute Rehab OT Goals Patient Stated Goal: have less pain and get stronger OT Goal Formulation: With patient Time For Goal Achievement: 04/30/20 Potential to Achieve Goals: Good ADL Goals Pt Will Perform Lower Body Dressing: with modified independence;sit to/from stand (AE as needed) Pt Will Transfer to Toilet: with modified independence;ambulating (LRAD for amb, comfort height toilet) Additional ADL Goal #1: Pt will verbalize plan to implement at least 1 learned caregiving strategy to maximize pt's safety and minimize risk of falls/over exertion and increased pain following surgery.  OT Frequency: Min 1X/week   Barriers to D/C:            Co-evaluation              AM-PAC OT "6 Clicks" Daily Activity     Outcome Measure Help from another person eating meals?: None Help from another person taking care of personal grooming?: None Help from another person toileting, which includes using toliet, bedpan, or urinal?: A Little Help from another person bathing (including washing, rinsing, drying)?: A Little Help from another  person to put on and taking off regular upper body clothing?: None Help from another person to put on and taking off regular lower body clothing?: A Little 6 Click Score: 21   End of Session Nurse Communication: Patient requests pain meds  Activity Tolerance: Patient limited by pain Patient left: in bed;with call bell/phone within reach;with bed alarm set  OT Visit Diagnosis: Other abnormalities of gait and mobility (R26.89);Muscle weakness (generalized) (M62.81);Pain Pain - Right/Left:  (abdomen)                Time: 0175-1025 OT Time  Calculation (min): 11 min Charges:  OT General Charges $OT Visit: 1 Visit OT Evaluation $OT Eval Low Complexity: 1 Low  Jeni Salles, MPH, MS, OTR/L ascom [phone] 04/16/20, 4:27 PM

## 2020-04-17 ENCOUNTER — Ambulatory Visit: Payer: Self-pay | Admitting: Gerontology

## 2020-04-17 ENCOUNTER — Encounter: Payer: Self-pay | Admitting: Family Medicine

## 2020-04-17 LAB — GLUCOSE, CAPILLARY
Glucose-Capillary: 109 mg/dL — ABNORMAL HIGH (ref 70–99)
Glucose-Capillary: 112 mg/dL — ABNORMAL HIGH (ref 70–99)
Glucose-Capillary: 126 mg/dL — ABNORMAL HIGH (ref 70–99)
Glucose-Capillary: 137 mg/dL — ABNORMAL HIGH (ref 70–99)
Glucose-Capillary: 72 mg/dL (ref 70–99)
Glucose-Capillary: 93 mg/dL (ref 70–99)

## 2020-04-17 LAB — BASIC METABOLIC PANEL
Anion gap: 6 (ref 5–15)
BUN: 9 mg/dL (ref 6–20)
CO2: 28 mmol/L (ref 22–32)
Calcium: 7.9 mg/dL — ABNORMAL LOW (ref 8.9–10.3)
Chloride: 98 mmol/L (ref 98–111)
Creatinine, Ser: 0.72 mg/dL (ref 0.61–1.24)
GFR, Estimated: >60 mL/min (ref >60)
Glucose, Bld: 109 mg/dL — ABNORMAL HIGH (ref 70–99)
Potassium: 2.8 mmol/L — ABNORMAL LOW (ref 3.5–5.1)
Sodium: 132 mmol/L — ABNORMAL LOW (ref 135–145)

## 2020-04-17 LAB — CBC WITH DIFFERENTIAL/PLATELET
Abs Immature Granulocytes: 0.13 10*3/uL — ABNORMAL HIGH (ref 0.00–0.07)
Basophils Absolute: 0 10*3/uL (ref 0.0–0.1)
Basophils Relative: 0 %
Eosinophils Absolute: 0.3 10*3/uL (ref 0.0–0.5)
Eosinophils Relative: 2 %
HCT: 34.3 % — ABNORMAL LOW (ref 39.0–52.0)
Hemoglobin: 11.7 g/dL — ABNORMAL LOW (ref 13.0–17.0)
Immature Granulocytes: 1 %
Lymphocytes Relative: 12 %
Lymphs Abs: 1.7 10*3/uL (ref 0.7–4.0)
MCH: 30.5 pg (ref 26.0–34.0)
MCHC: 34.1 g/dL (ref 30.0–36.0)
MCV: 89.3 fL (ref 80.0–100.0)
Monocytes Absolute: 1.6 10*3/uL — ABNORMAL HIGH (ref 0.1–1.0)
Monocytes Relative: 10 %
Neutro Abs: 11.4 10*3/uL — ABNORMAL HIGH (ref 1.7–7.7)
Neutrophils Relative %: 75 %
Platelets: 311 10*3/uL (ref 150–400)
RBC: 3.84 MIL/uL — ABNORMAL LOW (ref 4.22–5.81)
RDW: 14.7 % (ref 11.5–15.5)
WBC: 15.1 10*3/uL — ABNORMAL HIGH (ref 4.0–10.5)
nRBC: 1.7 % — ABNORMAL HIGH (ref 0.0–0.2)

## 2020-04-17 LAB — CULTURE, BLOOD (ROUTINE X 2)
Culture: NO GROWTH
Culture: NO GROWTH
Special Requests: ADEQUATE

## 2020-04-17 LAB — MAGNESIUM: Magnesium: 1.6 mg/dL — ABNORMAL LOW (ref 1.7–2.4)

## 2020-04-17 LAB — PHOSPHORUS: Phosphorus: 2.9 mg/dL (ref 2.5–4.6)

## 2020-04-17 MED ORDER — PANTOPRAZOLE SODIUM 40 MG PO TBEC
40.0000 mg | DELAYED_RELEASE_TABLET | Freq: Every day | ORAL | Status: DC
  Administered 2020-04-17: 40 mg via ORAL
  Filled 2020-04-17: qty 1

## 2020-04-17 MED ORDER — INFLUENZA VAC SPLIT QUAD 0.5 ML IM SUSY: 0.5000 mL | PREFILLED_SYRINGE | INTRAMUSCULAR | Status: DC | PRN

## 2020-04-17 MED ORDER — HAEMOPHILUS B POLYSAC CONJ VAC 10 MCG IJ SOLR
0.5000 mL | INTRAMUSCULAR | Status: DC | PRN
  Filled 2020-04-17 (×3): qty 0.5

## 2020-04-17 MED ORDER — POTASSIUM CHLORIDE IN NACL 20-0.9 MEQ/L-% IV SOLN
INTRAVENOUS | Status: DC
  Filled 2020-04-17 (×2): qty 1000

## 2020-04-17 MED ORDER — MAGNESIUM SULFATE 2 GM/50ML IV SOLN
2.0000 g | Freq: Once | INTRAVENOUS | Status: AC
  Administered 2020-04-17: 2 g via INTRAVENOUS
  Filled 2020-04-17: qty 50

## 2020-04-17 MED ORDER — TETANUS-DIPHTH-ACELL PERTUSSIS 5-2.5-18.5 LF-MCG/0.5 IM SUSY
0.5000 mL | PREFILLED_SYRINGE | INTRAMUSCULAR | Status: DC | PRN
  Filled 2020-04-17 (×2): qty 0.5

## 2020-04-17 MED ORDER — PNEUMOCOCCAL 13-VAL CONJ VACC IM SUSP
0.5000 mL | INTRAMUSCULAR | Status: DC | PRN
  Filled 2020-04-17 (×2): qty 0.5

## 2020-04-17 MED ORDER — POTASSIUM CHLORIDE 10 MEQ/100ML IV SOLN
10.0000 meq | INTRAVENOUS | Status: AC
  Filled 2020-04-17: qty 100

## 2020-04-17 MED ORDER — POTASSIUM CHLORIDE 20 MEQ PO PACK
40.0000 meq | PACK | Freq: Once | ORAL | Status: AC
  Administered 2020-04-17: 40 meq via ORAL
  Filled 2020-04-17: qty 2

## 2020-04-17 MED ORDER — POTASSIUM CHLORIDE 20 MEQ PO PACK
40.0000 meq | PACK | Freq: Three times a day (TID) | ORAL | Status: AC
  Administered 2020-04-17 (×3): 40 meq via ORAL
  Filled 2020-04-17 (×3): qty 2

## 2020-04-17 MED ORDER — CIPROFLOXACIN HCL 500 MG PO TABS
500.0000 mg | ORAL_TABLET | Freq: Two times a day (BID) | ORAL | Status: DC
  Administered 2020-04-17 – 2020-04-18 (×3): 500 mg via ORAL
  Filled 2020-04-17 (×3): qty 1

## 2020-04-17 NOTE — Progress Notes (Signed)
[place] Hospital Day(s): 5.   Post op day(s): 4 Days Post-Op.   Interval History:  Patient seen and examined No acute events or new complaints overnight.  Patient reports he is feeling better, still with burning pain at incision, lyrica is helping some No fever, chills, nausea, emesis Expected leukocytosis after splenectomy improving; now 15.1K Hgb remains stable at 11.7 Renal function remains stable, sCr - 0.72, UO - 500 He has persistent hypokalemia at 2.8; however, he is refusing IV infusions secondary to discomfort  He is also with hypomagnesemia to 1.6 Surgical drain with 435 ccs out in last 24 hours; serous Advanced to regular diet yesterday (11/09); tolerating well  Vital signs in last 24 hours: [min-max] current  Temp:  [97.5 F (36.4 C)-98.3 F (36.8 C)] 97.5 F (36.4 C) (11/10 0720) Pulse Rate:  [62-74] 62 (11/10 0720) Resp:  [15-20] 18 (11/10 0720) BP: (122-168)/(81-108) 122/81 (11/10 0720) SpO2:  [93 %-100 %] 97 % (11/10 0720)     Height: 5\' 11"  (180.3 cm) Weight: 76.4 kg BMI (Calculated): 23.5   Intake/Output last 2 shifts:  11/09 0701 - 11/10 0700 In: 1302.7 [P.O.:358; I.V.:527.6; IV Piggyback:417.2] Out: 935 [Urine:500; Drains:435]   Physical Exam:  Constitutional: alert, cooperative and no distress Respiratory: breathing non-labored at rest  Cardiovascular: regular rate and sinus rhythm  Gastrointestinal:Soft, incisional soreness, mild distension, no rebound/guarding, surgical drain in LUQ with more serous appearing output Integumentary:Laparotomy incision in CDI with staples, no erythema or drainage  Labs:  CBC Latest Ref Rng & Units 04/17/2020 04/16/2020 04/15/2020  WBC 4.0 - 10.5 K/uL 15.1(H) 19.1(H) 19.2(H)  Hemoglobin 13.0 - 17.0 g/dL 11.7(L) 12.4(L) 10.7(L)  Hematocrit 39 - 52 % 34.3(L) 36.8(L) 32.3(L)  Platelets 150 - 400 K/uL 311 271 210   CMP Latest Ref Rng & Units 04/17/2020 04/16/2020 04/15/2020   Glucose 70 - 99 mg/dL 109(H) 87 90  BUN 6 - 20 mg/dL 9 7 9   Creatinine 0.61 - 1.24 mg/dL 0.72 0.79 0.74  Sodium 135 - 145 mmol/L 132(L) 135 137  Potassium 3.5 - 5.1 mmol/L 2.8(L) 3.1(L) 3.1(L)  Chloride 98 - 111 mmol/L 98 97(L) 103  CO2 22 - 32 mmol/L 28 28 26   Calcium 8.9 - 10.3 mg/dL 7.9(L) 8.1(L) 7.9(L)  Total Protein 6.5 - 8.1 g/dL - - 6.0(L)  Total Bilirubin 0.3 - 1.2 mg/dL - - 0.7  Alkaline Phos 38 - 126 U/L - - 61  AST 15 - 41 U/L - - 67(H)  ALT 0 - 44 U/L - - 21     Imaging studies: No new pertinent imaging studies   Assessment/Plan:  59 y.o. male doing well aside from electrolyte derangments 4 Days Post-Op s/p exploratory laparotomy and splenectomyfor splenic rupture and hemorrhagic shock.   - Okay to continue regular diet as tolerates   - Monitor H&H; stable             - Replete K+ and Mg2+; monitor   - Monitor abdominal examination - Continue Abx; transitioned to PO (Cipro + Flagyl) given problems with IV - Pain control prn; antiemetics prn   - Okay to mobilize with therapies; follow up recommendations - Further management per primary service; we will follow     - Discharge Planning: He will need his splenectomy vaccines at discharge, discussed with pharmacy. Will order once ready to DC   All of the above findings and recommendations were discussed with the patient, and the medical team, and all of  patient's questions were answered to his expressed satisfaction.  -- Edison Simon, PA-C [facility] 04/17/2020, 7:36 AM [phone] M-F: 7am - 4pm

## 2020-04-17 NOTE — Progress Notes (Signed)
Received report from night shift RN Donita Brooks. Patient unable to tolerate potassium infusions. Patient complaining of burning while infusing and pain radiating up arm. Unable to tolerate Request change to PO. MD notified.   Thresa Ross, RN

## 2020-04-17 NOTE — Progress Notes (Signed)
PHARMACIST - PHYSICIAN COMMUNICATION  DR:   Mal Misty  CONCERNING: IV to Oral Route Change Policy  RECOMMENDATION: This patient is receiving pantoprazole by the intravenous route.  Based on criteria approved by the Pharmacy and Therapeutics Committee, the intravenous medication(s) is/are being converted to the equivalent oral dose form(s).   DESCRIPTION: These criteria include:  The patient is eating (either orally or via tube) and/or has been taking other orally administered medications for a least 24 hours  The patient has no evidence of active gastrointestinal bleeding or impaired GI absorption (gastrectomy, short bowel, patient on TNA or NPO).  If you have questions about this conversion, please contact the Pharmacy Department  []   [phone] )  [facility] [x]   [phone] )  [facility] []   [phone] )  [facility] []   [phone] )  [facility] []   [phone] )  [place], [facility] 04/17/2020 1:26 PM

## 2020-04-17 NOTE — TOC Initial Note (Signed)
Transition of Care [facility]) - Initial/Assessment Note    Patient Details  Name: Caleb Brooks MRN: [id number] Date of Birth: [birth year]  [facility] CM/SW Contact:    Beverly Sessions, RN Phone Number: 04/17/2020, 3:17 PM  Clinical Narrative:                  Patient admitted with Splenic injury Patient lives at home with his elderly   Patient states that he has a scooter for transportation.  Patient has a BSC, cane, and shower seat in the home  Patient states that he is a daily alcohol drinker, and uses cocaine 1-2 times a week.  TOC discussed substance abuse resources.  Patient declines resourcse.  He is agreeable for me to leave them at bedside should he change his mind  PT has assessed patient and recommends home health and RW.  Patient agreeable to services.  Patient is uninsured.  Referral made to [place] with [facility] as they are covering charity rotation  Referral made to [place] with [facility] for RW.  To be delivered to room.   Patient states he is established with [facility] .   Patient is not medically ready for discharge.  Patient states when he is ready he is unsure who will be picking him up, but will reach out to friends for transportation  Expected Discharge Plan: [place] Services Barriers to Discharge: Continued Medical Work up   Patient Goals and CMS Choice   CMS Medicare.gov Compare Post Acute Care list provided to:: Patient Choice offered to / list presented to : Patient  Expected Discharge Plan and Services Expected Discharge Plan: [place]   Discharge Planning Services: CM Consult Post Acute Care Choice: Home Health, Durable Medical Equipment Living arrangements for the past 2 months: Single Family Home                 DME Arranged: [place] rolling DME Agency: [facility] Date DME Agency Contacted: 04/17/20   Representative spoke with at DME Agency: [place] Arranged: RN, PT [place]  Agency: [facility] [place] Date [place]: 04/17/20   Representative spoke with at [place]: [place]  Prior Living Arrangements/Services Living arrangements for the past 2 months: [place] Lives with:: Parents Patient language and need for interpreter reviewed:: Yes Do you feel safe going back to the place where you live?: Yes          Current home services: DME Criminal Activity/Legal Involvement Pertinent to Current Situation/Hospitalization: No - Comment as needed  Activities of Daily Living Home Assistive Devices/Equipment: None ADL Screening (condition at time of admission) Patient's cognitive ability adequate to safely complete daily activities?: Yes Is the patient deaf or have difficulty hearing?: No Does the patient have difficulty seeing, even when wearing glasses/contacts?: No Does the patient have difficulty concentrating, remembering, or making decisions?: No Patient able to express need for assistance with ADLs?: Yes Does the patient have difficulty dressing or bathing?: No Independently performs ADLs?: Yes (appropriate for developmental age) Does the patient have difficulty walking or climbing stairs?: No Weakness of Legs: None Weakness of Arms/Hands: None  Permission Sought/Granted                  Emotional Assessment       Orientation: : Oriented to Self, Oriented to Place, Oriented to  Time, Oriented to Situation Alcohol / Substance Use: Illicit Drugs, Alcohol Use  Admission diagnosis:  Hemorrhagic shock ([place]) [R57.8] Hemoperitoneum [K66.1] Splenic hemorrhage [D73.5] Syncope, unspecified syncope type [R55] Patient Active Problem List   Diagnosis Date Noted  . Splenic hemorrhage 04/12/2020  . Hemoperitoneum   . Fatigue 02/22/2020  . Finger joint swelling, left 02/22/2020  . Exposure to herpes 09/27/2019  . Cyst of perianal area 07/26/2019  . Scrotal abscess 07/26/2019  . Onychomycosis 03/21/2019  . Callus of foot  02/07/2019  . Nail thickening 02/07/2019  . Encounter to establish care 02/07/2019  . Right middle ear infection 02/07/2019  . Right otitis media 02/07/2019  . Elevated blood pressure reading 02/07/2019  . Tobacco abuse 01/01/2015  . ETOH abuse 01/01/2015  . Arthritis 10/02/2014  . Hypertension 08/02/2014   PCP:  Langston Reusing, NP Pharmacy:   [facility] [place], [address] [place] [address] Phone: [phone] Fax: [phone]  [place], [place] [address] [place] [address] Phone: [phone] Fax: [place], [address] [place] [address] Phone: [phone] Fax: [phone]     Social Determinants of Health (SDOH) Interventions    Readmission Risk Interventions No flowsheet data found.

## 2020-04-17 NOTE — Progress Notes (Addendum)
Progress Note    RANDAL GOENS  INO:[id number] DOB: 31-Dec-1960  DOA: 04/12/2020 PCP: Langston Reusing, NP      Brief Narrative:    Medical records reviewed and are as summarized below:  TAVARUS POTEETE is a 59 y.o. male       Assessment/Plan:   Active Problems:   Splenic hemorrhage   Hemoperitoneum   Body mass index is 23.49 kg/m.   Acute splenic hemorrhage resulting in hemorrhagic shock and syncope, acute blood loss anemia, hemoperitoneum: s/p coil embolization of splenic artery on 04/12/2020.  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  Continue empiric IV antibiotics.  Analgesics as needed for pain.  He will need postsplenectomy vaccinations prior to discharge.  Suppurative perineal cellulitis/hidradenitis suppurativa: Continue empiric IV antibiotics.  Outpatient follow-up with general surgeon.  Leukocytosis: Some of this may be reactive from recent surgery  Severe hypokalemia: Replete potassium intravenously and orally.  Monitor potassium levels closely  Acute blood loss anemia: Improved.  S/p transfusion with 5 units of PRBCs.  Hypomagnesemia: Replete magnesium with IV mag sulfate and monitor levels.  Recent NSVT: No acute issues.  Replete electrolytes.  Tobacco and alcohol use disorder: counseled to quit.   Diet Order            Diet regular Room service appropriate? Yes; Fluid consistency: Thin  Diet effective now                    Consultants:  General surgeon  Intensivist  Procedures:  Exploratory laparotomy with splenectomy    Medications:   . amLODipine  5 mg Oral Daily  . ciprofloxacin  500 mg Oral BID  . docusate  100 mg Oral BID  . folic acid  1 mg Oral Daily  . insulin aspart  0-9 Units Subcutaneous Q4H  . metroNIDAZOLE  500 mg Oral Q8H  . multivitamin with minerals  1 tablet Oral Daily  . nicotine  21 mg Transdermal Daily  . pantoprazole  40 mg Oral QHS  . polyethylene glycol  17 g Oral Daily  .  potassium chloride  40 mEq Oral TID  . pregabalin  50 mg Oral TID  . thiamine  100 mg Oral Daily   Continuous Infusions: . 0.9 % NaCl with KCl 20 mEq / L 50 mL/hr at 04/17/20 1155     Anti-infectives (From admission, onward)   Start     Dose/Rate Route Frequency Ordered Stop   04/17/20 0945  ciprofloxacin (CIPRO) tablet 500 mg        500 mg Oral 2 times daily 04/17/20 0849     04/17/20 0000  metroNIDAZOLE (FLAGYL) tablet 500 mg        500 mg Oral Every 8 hours 04/16/20 1717     04/13/20 1200  doxycycline (VIBRAMYCIN) 100 mg in sodium chloride 0.9 % 250 mL IVPB  Status:  Discontinued        100 mg 125 mL/hr over 120 Minutes Intravenous Every 12 hours 04/13/20 1112 04/17/20 1336   04/12/20 2200  cefTRIAXone (ROCEPHIN) 2 g in sodium chloride 0.9 % 100 mL IVPB  Status:  Discontinued        2 g 200 mL/hr over 30 Minutes Intravenous Every 24 hours 04/12/20 1530 04/17/20 0849   04/12/20 2200  metroNIDAZOLE (FLAGYL) IVPB 500 mg  Status:  Discontinued        500 mg 100 mL/hr over 60 Minutes Intravenous Every 8 hours 04/12/20 1530 04/16/20  1717   04/12/20 1300  vancomycin (VANCOREADY) IVPB 750 mg/150 mL  Status:  Discontinued        750 mg 150 mL/hr over 60 Minutes Intravenous Every 12 hours 04/12/20 0544 04/13/20 1112   04/12/20 0600  meropenem (MERREM) 1 g in sodium chloride 0.9 % 100 mL IVPB  Status:  Discontinued        1 g 200 mL/hr over 30 Minutes Intravenous Every 8 hours 04/12/20 0526 04/12/20 1530   04/12/20 0545  vancomycin (VANCOREADY) IVPB 750 mg/150 mL        750 mg 150 mL/hr over 60 Minutes Intravenous  Once 04/12/20 0537 04/12/20 0949   04/12/20 0530  vancomycin (VANCOCIN) IVPB 1000 mg/200 mL premix  Status:  Discontinued        1,000 mg 200 mL/hr over 60 Minutes Intravenous  Once 04/12/20 0526 04/12/20 0650   04/12/20 0524  ceFAZolin (ANCEF) IVPB 2g/100 mL premix        2 g 200 mL/hr over 30 Minutes Intravenous 30 min pre-op 04/12/20 0524 04/12/20 0554   04/12/20 0030   piperacillin-tazobactam (ZOSYN) IVPB 3.375 g        3.375 g 100 mL/hr over 30 Minutes Intravenous  Once 04/12/20 0021 04/12/20 0213   04/12/20 0030  vancomycin (VANCOCIN) IVPB 1000 mg/200 mL premix        1,000 mg 200 mL/hr over 60 Minutes Intravenous  Once 04/12/20 0021 04/12/20 0236             Family Communication/Anticipated D/C date and plan/Code Status   DVT prophylaxis: Place and maintain sequential compression device Start: 04/13/20 1834 SCDs Start: 04/12/20 0516     Code Status: Full Code  Family Communication: None Disposition Plan:    Status is: Inpatient  Remains inpatient appropriate because:IV treatments appropriate due to intensity of illness or inability to take PO and Inpatient level of care appropriate due to severity of illness   Dispo: The patient is from: Home              Anticipated d/c is to: Home              Anticipated d/c date is: 2 days              Patient currently is not medically stable to d/c.           Subjective:   C/o burning sensation in the right arm at peripheral IV site from IV potassium. C/o abdominal pain though it's a little better today.  Objective:    Vitals:   04/16/20 2057 04/17/20 0034 04/17/20 0445 04/17/20 0720  BP: (!) 154/96 (!) 156/105 133/86 122/81  Pulse: 74 71 70 62  Resp: 20 18 20 18   Temp: 98 F (36.7 C) 97.8 F (36.6 C) 98 F (36.7 C) (!) 97.5 F (36.4 C)  TempSrc: Oral Oral Oral Oral  SpO2: 100% 100% 99% 97%  Weight:      Height:       No data found.   Intake/Output Summary (Last 24 hours) at 04/17/2020 1647 Last data filed at 04/17/2020 1553 Gross per 24 hour  Intake 118 ml  Output 855 ml  Net -737 ml   Filed Weights   04/12/20 0013 04/12/20 0620  Weight: 74.8 kg 76.4 kg    Exam:  GEN: NAD SKIN: No rash EYES: EOMI ENT: MMM CV: RRR PULM: CTA B ABD: soft, ND, surgical tenderness, +BS.  Staples are intact on the midline surgical wound.  There is a JP drain in the left  upper quadrant with serosanguineous drainage.  CNS: AAO x 3, non focal EXT: No edema or tenderness   Data Reviewed:   I have personally reviewed following labs and imaging studies:  Labs: Labs show the following:   Basic Metabolic Panel: Recent Labs  Lab 04/12/20 0930 04/13/20 0425 04/13/20 1833 04/13/20 1833 04/14/20 0433 04/14/20 0433 04/15/20 0649 04/15/20 0649 04/16/20 0435 04/17/20 0411  NA 138   < > 139  --  139  --  137  --  135 132*  K 3.8   < > 4.0   < > 4.2   < > 3.1*   < > 3.1* 2.8*  CL 104   < > 109  --  110  --  103  --  97* 98  CO2 23   < > 20*  --  24  --  26  --  28 28  GLUCOSE 204*   < > 111*  --  136*  --  90  --  87 109*  BUN 18   < > 13  --  11  --  9  --  7 9  CREATININE 1.45*   < > 0.84  --  0.81  --  0.74  --  0.79 0.72  CALCIUM 7.4*   < > 7.3*  --  7.2*  --  7.9*  --  8.1* 7.9*  MG 1.5*  --  2.0  --  1.9  --   --   --  1.7 1.6*  PHOS 4.4  --  2.7  --  3.0  --  1.8*  --  2.7 2.9   < > = values in this interval not displayed.   GFR Estimated Creatinine Clearance: 108.5 mL/min (by C-G formula based on SCr of 0.72 mg/dL). Liver Function Tests: Recent Labs  Lab 04/12/20 0930 04/13/20 0425 04/13/20 1833 04/14/20 0433 04/15/20 0649  AST 22 18 35 36 67*  ALT 10 10 13 13 21   ALKPHOS 78 76 74 64 61  BILITOT 1.2 0.6 1.3* 0.7 0.7  PROT 5.9* 6.0* 5.8* 5.2* 6.0*  ALBUMIN 2.5* 2.6* 2.5* 2.2* 2.4*   No results for input(s): LIPASE, AMYLASE in the last 168 hours. No results for input(s): AMMONIA in the last 168 hours. Coagulation profile Recent Labs  Lab 04/12/20 0207 04/13/20 0425  INR 1.2 1.1    CBC: Recent Labs  Lab 04/12/20 0016 04/12/20 0016 04/12/20 0930 04/12/20 1612 04/14/20 0021 04/14/20 0433 04/15/20 0649 04/16/20 0435 04/17/20 0411  WBC 15.9*   < > 19.9*   < > 23.6* 20.6* 19.2* 19.1* 15.1*  NEUTROABS 13.4*  --  17.0*  --   --  18.4*  --  15.4* 11.4*  HGB 10.9*   < > 9.9*   < > 11.1* 10.7* 10.7* 12.4* 11.7*  HCT 33.5*   <  > 30.7*   < > 32.2* 31.8* 32.3* 36.8* 34.3*  MCV 96.3   < > 91.4   < > 90.2 90.9 91.0 90.6 89.3  PLT 292   < > 217   < > 163 169 210 271 311   < > = values in this interval not displayed.   Cardiac Enzymes: No results for input(s): CKTOTAL, CKMB, CKMBINDEX, TROPONINI in the last 168 hours. BNP (last 3 results) No results for input(s): PROBNP in the last 8760 hours. CBG: Recent Labs  Lab 04/17/20 0033 04/17/20 0448 04/17/20 0750 04/17/20 1145 04/17/20  1640  GLUCAP 72 93 137* 109* 126*   D-Dimer: No results for input(s): DDIMER in the last 72 hours. Hgb A1c: No results for input(s): HGBA1C in the last 72 hours. Lipid Profile: No results for input(s): CHOL, HDL, LDLCALC, TRIG, CHOLHDL, LDLDIRECT in the last 72 hours. Thyroid function studies: No results for input(s): TSH, T4TOTAL, T3FREE, THYROIDAB in the last 72 hours.  Invalid input(s): FREET3 Anemia work up: No results for input(s): VITAMINB12, FOLATE, FERRITIN, TIBC, IRON, RETICCTPCT in the last 72 hours. Sepsis Labs: Recent Labs  Lab 04/12/20 0016 04/12/20 0207 04/12/20 0930 04/12/20 0930 04/12/20 1219 04/13/20 0425 04/13/20 0425 04/13/20 1833 04/13/20 2157 04/14/20 0021 04/14/20 0433 04/15/20 0649 04/16/20 0435 04/17/20 0411  PROCALCITON 0.28  --   --   --   --  0.61  --   --   --   --   --   --   --   --   WBC 15.9*   < > 19.9*   < >  --  18.9*   < > 28.1*  --    < > 20.6* 19.2* 19.1* 15.1*  LATICACIDVEN 5.4*   < > 4.0*  --  2.6*  --   --  2.5* 0.9  --   --   --   --   --    < > = values in this interval not displayed.    Microbiology Recent Results (from the past 240 hour(s))  Blood culture (routine x 2)     Status: None   Collection Time: 04/12/20  2:07 AM   Specimen: BLOOD  Result Value Ref Range Status   Specimen Description BLOOD LINE RIGHT HAND  Final   Special Requests   Final    BOTTLES DRAWN AEROBIC AND ANAEROBIC Blood Culture results may not be optimal due to an inadequate volume of blood  received in culture bottles   Culture   Final    NO GROWTH 5 DAYS Performed at [facility], [address] [place], [place] [address]    Report Status 04/17/2020 FINAL  Final  Blood culture (routine x 2)     Status: None   Collection Time: 04/12/20  2:07 AM   Specimen: BLOOD  Result Value Ref Range Status   Specimen Description BLOOD RIGHT HAND  Final   Special Requests   Final    BOTTLES DRAWN AEROBIC AND ANAEROBIC Blood Culture adequate volume   Culture   Final    NO GROWTH 5 DAYS Performed at [facility], [address], [place], [place] [address]    Report Status 04/17/2020 FINAL  Final  Respiratory Panel by RT PCR (Flu A&B, Covid) - Nasopharyngeal Swab     Status: None   Collection Time: 04/12/20  2:07 AM   Specimen: Nasopharyngeal Swab  Result Value Ref Range Status   SARS Coronavirus 2 by RT PCR NEGATIVE NEGATIVE Final    Comment: (NOTE) SARS-CoV-2 target nucleic acids are NOT DETECTED.  The SARS-CoV-2 RNA is generally detectable in upper respiratoy specimens during the acute phase of infection. The lowest concentration of SARS-CoV-2 viral copies this assay can detect is 131 copies/mL. A negative result does not preclude SARS-Cov-2 infection and should not be used as the sole basis for treatment or other patient management decisions. A negative result may occur with  improper specimen collection/handling, submission of specimen other than nasopharyngeal swab, presence of viral mutation(s) within the areas targeted by this assay, and inadequate number of viral copies (<  131 copies/mL). A negative result must be combined with clinical observations, patient history, and epidemiological information. The expected result is Negative.  Fact Sheet for Patients:  PinkCheek.be  Fact Sheet for Healthcare Providers:  GravelBags.it  This test is no t yet approved or cleared by the [place] FDA  and  has been authorized for detection and/or diagnosis of SARS-CoV-2 by FDA under an Emergency Use Authorization (EUA). This EUA will remain  in effect (meaning this test can be used) for the duration of the COVID-19 declaration under Section 564(b)(1) of the Act, 21 U.S.C. section 360bbb-3(b)(1), unless the authorization is terminated or revoked sooner.     Influenza A by PCR NEGATIVE NEGATIVE Final   Influenza B by PCR NEGATIVE NEGATIVE Final    Comment: (NOTE) The Xpert Xpress SARS-CoV-2/FLU/RSV assay is intended as an aid in  the diagnosis of influenza from Nasopharyngeal swab specimens and  should not be used as a sole basis for treatment. Nasal washings and  aspirates are unacceptable for Xpert Xpress SARS-CoV-2/FLU/RSV  testing.  Fact Sheet for Patients: PinkCheek.be  Fact Sheet for Healthcare Providers: GravelBags.it  This test is not yet approved or cleared by the [place] FDA and  has been authorized for detection and/or diagnosis of SARS-CoV-2 by  FDA under an Emergency Use Authorization (EUA). This EUA will remain  in effect (meaning this test can be used) for the duration of the  Covid-19 declaration under Section 564(b)(1) of the Act, 21  U.S.C. section 360bbb-3(b)(1), unless the authorization is  terminated or revoked. Performed at [facility], [address] [place], [place] [address]   MRSA PCR Screening     Status: None   Collection Time: 04/12/20  6:14 AM   Specimen: Nasopharyngeal  Result Value Ref Range Status   MRSA by PCR NEGATIVE NEGATIVE Final    Comment:        The GeneXpert MRSA Assay (FDA approved for NASAL specimens only), is one component of a comprehensive MRSA colonization surveillance program. It is not intended to diagnose MRSA infection nor to guide or monitor treatment for MRSA infections. Performed at [facility], [address] [place],  [place] [address]     Procedures and diagnostic studies:  No results found.             LOS: 5 days   Rawad Bochicchio  [facility]   Pager on [email or web address]. If 7PM-7AM, please contact night-coverage at [email or web address]     04/17/2020, 4:47 PM

## 2020-04-17 NOTE — Consult Note (Signed)
[place] for Electrolyte Monitoring and Replacement   Recent Labs: Potassium (mmol/L)  Date Value  04/17/2020 2.8 (L)   Magnesium (mg/dL)  Date Value  04/17/2020 1.6 (L)   Calcium (mg/dL)  Date Value  04/17/2020 7.9 (L)   Albumin (g/dL)  Date Value  04/15/2020 2.4 (L)  03/14/2020 4.1   Phosphorus (mg/dL)  Date Value  04/17/2020 2.9   Sodium (mmol/L)  Date Value  04/17/2020 132 (L)  03/14/2020 140   Corrected calcium: 9.18 mg/dL   Assessment: Patient is a 59 y/o M with medical history including hypertension, hidradenitis suppurativa, EtOH and tobacco abuse who is admitted with splenic hemorrhage and perineal cellulitis. Patient is now POD # 4 from exploratory laparotomy with splenectomy. Pharmacy has been consulted to assist with electrolyte monitoring and replacement. He is having significant pain limiting the amount of IV KCl he can receive  Diet: regular MIVF:  NS + 40 mEq K/L at 75 mL/hr (72 mEq K/day)   Goal of Therapy:  Electrolytes within normal limits  Plan:   2 grams IV magnesium sulfate  Fluids changed to NS + 20 mEq K/L at 50 mL/hr (24 mEq K/day)  40 mEq oral KCl x 3  Re-check all electrolytes with AM labs  Caleb Brooks 04/17/2020 7:39 AM

## 2020-04-18 ENCOUNTER — Other Ambulatory Visit: Payer: Self-pay | Admitting: Internal Medicine

## 2020-04-18 DIAGNOSIS — E876 Hypokalemia: Secondary | ICD-10-CM | POA: Diagnosis not present

## 2020-04-18 DIAGNOSIS — R578 Other shock: Secondary | ICD-10-CM | POA: Diagnosis present

## 2020-04-18 DIAGNOSIS — D62 Acute posthemorrhagic anemia: Secondary | ICD-10-CM | POA: Diagnosis present

## 2020-04-18 DIAGNOSIS — L732 Hidradenitis suppurativa: Secondary | ICD-10-CM

## 2020-04-18 LAB — PHOSPHORUS: Phosphorus: 2.8 mg/dL (ref 2.5–4.6)

## 2020-04-18 LAB — GLUCOSE, CAPILLARY
Glucose-Capillary: 104 mg/dL — ABNORMAL HIGH (ref 70–99)
Glucose-Capillary: 117 mg/dL — ABNORMAL HIGH (ref 70–99)
Glucose-Capillary: 94 mg/dL (ref 70–99)
Glucose-Capillary: 97 mg/dL (ref 70–99)

## 2020-04-18 LAB — CBC WITH DIFFERENTIAL/PLATELET
Abs Immature Granulocytes: 0.16 10*3/uL — ABNORMAL HIGH (ref 0.00–0.07)
Basophils Absolute: 0 10*3/uL (ref 0.0–0.1)
Basophils Relative: 0 %
Eosinophils Absolute: 0.4 10*3/uL (ref 0.0–0.5)
Eosinophils Relative: 3 %
HCT: 33.4 % — ABNORMAL LOW (ref 39.0–52.0)
Hemoglobin: 11.4 g/dL — ABNORMAL LOW (ref 13.0–17.0)
Immature Granulocytes: 1 %
Lymphocytes Relative: 13 %
Lymphs Abs: 1.9 10*3/uL (ref 0.7–4.0)
MCH: 30.7 pg (ref 26.0–34.0)
MCHC: 34.1 g/dL (ref 30.0–36.0)
MCV: 90 fL (ref 80.0–100.0)
Monocytes Absolute: 1.5 10*3/uL — ABNORMAL HIGH (ref 0.1–1.0)
Monocytes Relative: 11 %
Neutro Abs: 10.7 10*3/uL — ABNORMAL HIGH (ref 1.7–7.7)
Neutrophils Relative %: 72 %
Platelets: 365 10*3/uL (ref 150–400)
RBC: 3.71 MIL/uL — ABNORMAL LOW (ref 4.22–5.81)
RDW: 14.6 % (ref 11.5–15.5)
WBC: 14.7 10*3/uL — ABNORMAL HIGH (ref 4.0–10.5)
nRBC: 0.6 % — ABNORMAL HIGH (ref 0.0–0.2)

## 2020-04-18 LAB — BASIC METABOLIC PANEL
Anion gap: 5 (ref 5–15)
BUN: 7 mg/dL (ref 6–20)
CO2: 26 mmol/L (ref 22–32)
Calcium: 7.8 mg/dL — ABNORMAL LOW (ref 8.9–10.3)
Chloride: 104 mmol/L (ref 98–111)
Creatinine, Ser: 0.67 mg/dL (ref 0.61–1.24)
GFR, Estimated: >60 mL/min (ref >60)
Glucose, Bld: 97 mg/dL (ref 70–99)
Potassium: 3.9 mmol/L (ref 3.5–5.1)
Sodium: 135 mmol/L (ref 135–145)

## 2020-04-18 LAB — MAGNESIUM: Magnesium: 1.6 mg/dL — ABNORMAL LOW (ref 1.7–2.4)

## 2020-04-18 MED ORDER — HAEMOPHILUS B POLYSAC CONJ VAC 10 MCG IJ SOLR
0.5000 mL | Freq: Once | INTRAMUSCULAR | Status: AC
  Administered 2020-04-18: 0.5 mL via INTRAMUSCULAR
  Filled 2020-04-18: qty 0.5

## 2020-04-18 MED ORDER — INFLUENZA VAC SPLIT QUAD 0.5 ML IM SUSY
0.5000 mL | PREFILLED_SYRINGE | Freq: Once | INTRAMUSCULAR | Status: AC
  Administered 2020-04-18: 0.5 mL via INTRAMUSCULAR

## 2020-04-18 MED ORDER — MENINGOCOCCAL A C Y&W-135 OLIG IM SOLR
0.5000 mL | Freq: Once | INTRAMUSCULAR | Status: AC
  Administered 2020-04-18: 0.5 mL via INTRAMUSCULAR
  Filled 2020-04-18: qty 0.5

## 2020-04-18 MED ORDER — MAGNESIUM SULFATE 2 GM/50ML IV SOLN
2.0000 g | Freq: Once | INTRAVENOUS | Status: AC
  Administered 2020-04-18: 2 g via INTRAVENOUS
  Filled 2020-04-18: qty 50

## 2020-04-18 MED ORDER — TETANUS-DIPHTH-ACELL PERTUSSIS 5-2.5-18.5 LF-MCG/0.5 IM SUSY
0.5000 mL | PREFILLED_SYRINGE | Freq: Once | INTRAMUSCULAR | Status: AC
  Administered 2020-04-18: 0.5 mL via INTRAMUSCULAR
  Filled 2020-04-18: qty 0.5

## 2020-04-18 MED ORDER — FOLIC ACID 1 MG PO TABS
1.0000 mg | ORAL_TABLET | Freq: Every day | ORAL | 0 refills | Status: DC
Start: 2020-04-19 — End: 2020-05-08

## 2020-04-18 MED ORDER — METRONIDAZOLE 500 MG PO TABS: 500.0000 mg | ORAL_TABLET | Freq: Three times a day (TID) | ORAL | 0 refills | Status: AC

## 2020-04-18 MED ORDER — CIPROFLOXACIN HCL 500 MG PO TABS: 500.0000 mg | ORAL_TABLET | Freq: Two times a day (BID) | ORAL | 0 refills | Status: DC

## 2020-04-18 MED ORDER — ACETAMINOPHEN 325 MG PO TABS: 650.0000 mg | ORAL_TABLET | Freq: Four times a day (QID) | ORAL | Status: DC | PRN

## 2020-04-18 MED ORDER — MENINGOCOCCAL A C Y&W-135 OLIG IM SOLR
0.5000 mL | INTRAMUSCULAR | Status: DC | PRN
  Filled 2020-04-18 (×2): qty 0.5

## 2020-04-18 MED ORDER — PNEUMOCOCCAL 13-VAL CONJ VACC IM SUSP
0.5000 mL | Freq: Once | INTRAMUSCULAR | Status: AC
  Administered 2020-04-18: 0.5 mL via INTRAMUSCULAR
  Filled 2020-04-18: qty 0.5

## 2020-04-18 MED ORDER — ADULT MULTIVITAMIN W/MINERALS CH
1.0000 | ORAL_TABLET | Freq: Every day | ORAL | 0 refills | Status: DC
Start: 2020-04-19 — End: 2020-05-08

## 2020-04-18 MED ORDER — NICOTINE 21 MG/24HR TD PT24
21.0000 mg | MEDICATED_PATCH | Freq: Every day | TRANSDERMAL | 0 refills | Status: DC
Start: 2020-04-19 — End: 2020-11-19

## 2020-04-18 MED ORDER — AMLODIPINE BESYLATE 5 MG PO TABS
5.0000 mg | ORAL_TABLET | Freq: Every day | ORAL | 0 refills | Status: DC
Start: 2020-04-19 — End: 2020-05-08

## 2020-04-18 MED ORDER — THIAMINE HCL 100 MG PO TABS
100.0000 mg | ORAL_TABLET | Freq: Every day | ORAL | 0 refills | Status: DC
Start: 2020-04-19 — End: 2020-05-08

## 2020-04-18 MED ORDER — ACETAMINOPHEN 325 MG PO TABS: 650.0000 mg | ORAL_TABLET | Freq: Four times a day (QID) | ORAL | 0 refills | Status: AC | PRN

## 2020-04-18 NOTE — Progress Notes (Signed)
[facility] Hospital Day(s): 6.   Post op day(s): 5 Days Post-Op.   Interval History:  Patient seen and examined No acute events or new complaints overnight.  Patient reports he is doing much better and notes "he's back to his normal self." Still with abdominal soreness but improved No fever, chills, nausea, emesis Expected leukocytosis after splenectomy improving; now 14.1K Hgb remains stable at 11.4 Renal function remains stable, sCr - 0.67, UO - 600 ccs + unmeasured  Hypokalemia resolved this morning Still with very mild hypomagnesemia to 1.6 No other electrolyte derangements this morning  Surgical drain with 370 ccs out in last 24 hours; serous On regular diet; tolerating well  Vital signs in last 24 hours: [min-max] current  Temp:  [97.7 F (36.5 C)-98.8 F (37.1 C)] 97.7 F (36.5 C) (11/11 0325) Pulse Rate:  [63-71] 63 (11/11 0325) Resp:  [16-18] 16 (11/11 0325) BP: (111-133)/(76-87) 124/87 (11/11 0325) SpO2:  [98 %-100 %] 99 % (11/11 0325)     Height: 5\' 11"  (180.3 cm) Weight: 76.4 kg BMI (Calculated): 23.5   Intake/Output last 2 shifts:  11/10 0701 - 11/11 0700 In: 357.2 [P.O.:100; I.V.:257.2] Out: 22 [Urine:600; Drains:370]   Physical Exam:  Constitutional: alert, cooperative and no distress Respiratory: breathing non-labored at rest  Cardiovascular: regular rate and sinus rhythm  Gastrointestinal:Soft, incisional soreness, mild distension, no rebound/guarding, surgical drain in LUQ with more serous appearing output Integumentary:Laparotomy incision in CDI with staples, no erythema or drainage   Labs:  CBC Latest Ref Rng & Units 04/18/2020 04/17/2020 04/16/2020  WBC 4.0 - 10.5 K/uL 14.7(H) 15.1(H) 19.1(H)  Hemoglobin 13.0 - 17.0 g/dL 11.4(L) 11.7(L) 12.4(L)  Hematocrit 39 - 52 % 33.4(L) 34.3(L) 36.8(L)  Platelets 150 - 400 K/uL 365 311 271   CMP Latest Ref Rng & Units 04/18/2020 04/17/2020 04/16/2020  Glucose 70  - 99 mg/dL 97 109(H) 87  BUN 6 - 20 mg/dL 7 9 7   Creatinine 0.61 - 1.24 mg/dL 0.67 0.72 0.79  Sodium 135 - 145 mmol/L 135 132(L) 135  Potassium 3.5 - 5.1 mmol/L 3.9 2.8(L) 3.1(L)  Chloride 98 - 111 mmol/L 104 98 97(L)  CO2 22 - 32 mmol/L 26 28 28   Calcium 8.9 - 10.3 mg/dL 7.8(L) 7.9(L) 8.1(L)  Total Protein 6.5 - 8.1 g/dL - - -  Total Bilirubin 0.3 - 1.2 mg/dL - - -  Alkaline Phos 38 - 126 U/L - - -  AST 15 - 41 U/L - - -  ALT 0 - 44 U/L - - -     Imaging studies: No new pertinent imaging studies   Assessment/Plan:  59 y.o. male doing well with correction in his electrolytes this morning 5 Days Post-Op s/p exploratory laparotomy and splenectomyfor splenic rupture and hemorrhagic shock.   - Okay to continue regular diet as tolerates   - Okay to remove drain             - Monitor H&H; stable   - Replete Mg2+   - Monitor abdominal examination - Continue Abx; transitioned to PO (Cipro + Flagyl); Abx for home per primary   - Okay to mobilize with therapies; follow up recommendations - Further management per primary service   - Discharge Planning; Patient stable for discharge from surgical perspective, will need post splenectomy vaccinations at discharge, follow up with Dr Dahlia Byes in 1 week for staple removal.   All of the above findings and recommendations were discussed with the patient, and the  medical team, and all of patient's questions were answered to his expressed satisfaction.  -- Edison Simon, PA-C [facility] 04/18/2020, 7:44 AM [phone] M-F: 7am - 4pm

## 2020-04-18 NOTE — Progress Notes (Signed)
AVS given to patient at this time. Pt also given full filled out vaccination card and educated about next doses due. Pt states he will try to remember. IV removed at this time. Pt educated about instructions. States friend is bringing them clothes at this time. Pt denies further questions.

## 2020-04-18 NOTE — Progress Notes (Addendum)
Pharmacy - Brief Note  Patient s/p splenectomy  On day of discharge (04/18/2020), patient given:  Meningococcal A,C,W,Y (Menveo)  Pneumococcal-13 Conjugate (Prevnar-13)  Haemophilus B conjugate (Hiberix)  Seasonal influenza  TdAP  Note - he will need to get Meningococcal serogroup B as outpatient (Both 1st dose followed by 2nd dose 8 weeks)  At 8 weeks will need:  2nd dose of meningococcal ACWY (Menveo)   Pneumococcal-23  (Pneumovax-23)   Educated patient about importance of seeking medical attention immediately if has fever/chills or bitten by animal    - Patient also aware will need f/u vaccinations  Doreene Eland, PharmD, BCPS.   Work Cell: [phone] 04/18/2020 12:46 PM

## 2020-04-18 NOTE — Discharge Summary (Addendum)
Physician Discharge Summary  Caleb Brooks YYT:[id number] DOB: 12/12/1960 DOA: 04/12/2020  PCP: Caleb Reusing, NP  Admit date: 04/12/2020 Discharge date: 04/18/2020  Discharge disposition: Home   Recommendations for Outpatient Follow-Up:   Follow up with Dr. Dahlia Brooks, general surgeon, in 1 week Follow-up with PCP in 1 to 2 weeks   Discharge Diagnosis:   Principal Problem:   Splenic hemorrhage Active Problems:   Hemoperitoneum   Hemorrhagic shock ([place])   Acute blood loss anemia   Hypokalemia   Hypomagnesemia   Hidradenitis suppurativa    Discharge Condition: Stable.  Diet recommendation:  Diet Order            Diet - low sodium heart healthy           Diet regular Room service appropriate? Yes; Fluid consistency: Thin  Diet effective now                   Code Status: Full Code     Hospital Course:   Caleb Brooks is a 59 year old man with medical history significant for hypertension, hidradenitis suppurativa complicated by recurrent bouts anticoagulation, anxiety, osteoarthritis, alcohol use disorder, tobacco use disorder and cocaine use disorder.  He was brought to the hospital after 2 syncopal episodes.  Reportedly, his blood pressure was 54/30 when EMS arrived.  He had fallen a few days prior to the syncopal episodes and has been complaining of left upper quadrant pain since the fall.  He also had recent purulent drainage, pain and swelling in the perineal area from perineal boils.  He was found to have hemorrhagic shock secondary to splenic hemorrhage with hemoperitoneum.  He was admitted to the ICU for further management.  He was treated with IV fluids and empiric IV antibiotics because of concern for infected bowel secondary to hidradenitis suppurativa.  He was transfused with about 5 units of packed red blood cells for acute blood loss anemia.  He was seen in consultation by the vascular surgeon and he underwent coil embolization of splenic artery on  04/12/2020.  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 He had hypokalemia and hypomagnesemia that were repleted.  He has been advised to avoid alcohol, tobacco products, cocaine and any other illicit drugs.  His condition has improved and from general surgeon's standpoint, he is okay for discharge.  He also received postsplenectomy vaccinations prior to discharge.  Please see note from pharmacist Caleb Brooks) below regarding vaccination status:  "On day of discharge (04/18/2020), patient given:  Meningococcal A,C,W,Y (Menveo)  Pneumococcal-13 Conjugate (Prevnar-13)  Haemophilus B conjugate (Hiberix)  Seasonal influenza  TdAP  Note - he will need to get Meningococcal serogroup B as outpatient (Both 1st dose followed by 2nd dose 8 weeks)  At 8 weeks will need:  2nd dose of meningococcal ACWY (Menveo)   Pneumococcal-23  (Pneumovax-23)   Educated patient about importance of seeking medical attention immediately if has fever/chills or bitten by animal    - Patient also aware will need f/u vaccinations"    Medical Consultants:    Vascular surgeon  General surgery  Intensivist   Discharge Exam:    Vitals:   04/18/20 0010 04/18/20 0325 04/18/20 0843 04/18/20 1120  BP: 123/87 124/87 (!) 148/93 109/75  Pulse: 64 63 72 66  Resp: 18 16 20 18   Temp: 98.6 F (37 C) 97.7 F (36.5 C) 98.1 F (36.7 C) 98.2 F (36.8 C)  TempSrc: Oral  Oral Axillary Oral  SpO2: 99% 99% 97% 100%  Weight:      Height:         GEN: NAD SKIN: No rash EYES: No pallor or icterus ENT: MMM CV: RRR PULM: CTA B ABD: soft, ND, NT, +BS.  Staples are intact on midline surgical wound.  JP drain was still in place at the time of my exam. CNS: AAO x 3, non focal EXT: No edema or tenderness   The results of significant diagnostics from this hospitalization (including imaging, microbiology, ancillary and laboratory)  are listed below for reference.     Procedures and Diagnostic Studies:   ECHOCARDIOGRAM COMPLETE  Result Date: 04/14/2020    ECHOCARDIOGRAM REPORT   Patient Name:   Caleb Brooks Date of Exam: 04/14/2020 Medical Rec #:  [id number]     Height:       71.0 in Accession #:    [id number]    Weight:       168.4 lb Date of Birth:  [birth year]      BSA:          1.960 m Patient Age:    34 years      BP:           102/74 mmHg Patient Gender: M             HR:           65 bpm. Exam Location:  [facility] Procedure: 2D Echo Indications:     Non Sustained SVT  History:         Patient has no prior history of Echocardiogram examinations.                  Risk Factors:Hypertension and Current Smoker.  Sonographer:     L Thornton-Maynard Referring Phys:  [id number] DIEGO F PABON Diagnosing Phys: Kate Sable MD IMPRESSIONS  1. Left ventricular ejection fraction, by estimation, is 60 to 65%. The left ventricle has normal function. The left ventricle has no regional wall motion abnormalities. Left ventricular diastolic parameters were normal.  2. Right ventricular systolic function is normal. The right ventricular size is normal. There is normal pulmonary artery systolic pressure.  3. The mitral valve is normal in structure. No evidence of mitral valve regurgitation. No evidence of mitral stenosis.  4. The aortic valve is normal in structure. Aortic valve regurgitation is trivial. No aortic stenosis is present.  5. The inferior vena cava is normal in size with greater than 50% respiratory variability, suggesting right atrial pressure of 3 mmHg. FINDINGS  Left Ventricle: Left ventricular ejection fraction, by estimation, is 60 to 65%. The left ventricle has normal function. The left ventricle has no regional wall motion abnormalities. The left ventricular internal cavity size was normal in size. There is  no left ventricular hypertrophy. Left ventricular diastolic parameters were normal. Right Ventricle: The right ventricular size is  normal. No increase in right ventricular wall thickness. Right ventricular systolic function is normal. There is normal pulmonary artery systolic pressure. The tricuspid regurgitant velocity is 2.43 m/s, and  with an assumed right atrial pressure of 3 mmHg, the estimated right ventricular systolic pressure is 70.1 mmHg. Left Atrium: Left atrial size was normal in size. Right Atrium: Right atrial size was normal in size. Pericardium: There is no evidence of pericardial effusion. Mitral Valve: The mitral valve is normal in structure. No evidence of mitral valve regurgitation. No evidence of mitral valve stenosis. Tricuspid Valve: The tricuspid valve is normal  in structure. Tricuspid valve regurgitation is not demonstrated. No evidence of tricuspid stenosis. Aortic Valve: The aortic valve is normal in structure. Aortic valve regurgitation is trivial. No aortic stenosis is present. Aortic valve mean gradient measures 6.0 mmHg. Aortic valve peak gradient measures 11.7 mmHg. Aortic valve area, by VTI measures 2.23 cm. Pulmonic Valve: The pulmonic valve was normal in structure. Pulmonic valve regurgitation is trivial. No evidence of pulmonic stenosis. Aorta: The aortic root is normal in size and structure. Venous: The inferior vena cava is normal in size with greater than 50% respiratory variability, suggesting right atrial pressure of 3 mmHg. IAS/Shunts: No atrial level shunt detected by color flow Doppler.  LEFT VENTRICLE PLAX 2D LVIDd:         4.89 cm  Diastology LVIDs:         2.87 cm  LV e' medial:    7.51 cm/s LV PW:         1.11 cm  LV E/e' medial:  7.7 LV IVS:        1.15 cm  LV e' lateral:   9.57 cm/s LVOT diam:     2.30 cm  LV E/e' lateral: 6.0 LV SV:         71 LV SV Index:   36 LVOT Area:     4.15 cm  RIGHT VENTRICLE RV S prime:     20.50 cm/s TAPSE (M-mode): 3.4 cm LEFT ATRIUM             Index       RIGHT ATRIUM           Index LA diam:        3.00 cm 1.53 cm/m  RA Area:     18.60 cm LA Vol (A2C):   61.9  ml 31.58 ml/m RA Volume:   53.30 ml  27.19 ml/m LA Vol (A4C):   49.2 ml 25.10 ml/m LA Biplane Vol: 58.4 ml 29.79 ml/m  AORTIC VALVE                    PULMONIC VALVE AV Area (Vmax):    2.53 cm     PV Vmax:       0.98 m/s AV Area (Vmean):   2.39 cm     PV Peak grad:  3.8 mmHg AV Area (VTI):     2.23 cm AV Vmax:           171.00 cm/s AV Vmean:          119.000 cm/s AV VTI:            0.318 m AV Peak Grad:      11.7 mmHg AV Mean Grad:      6.0 mmHg LVOT Vmax:         104.00 cm/s LVOT Vmean:        68.500 cm/s LVOT VTI:          0.171 m LVOT/AV VTI ratio: 0.54  AORTA Ao Root diam: 3.60 cm MITRAL VALVE               TRICUSPID VALVE MV Area (PHT): 3.55 cm    TR Peak grad:   23.6 mmHg MV E velocity: 57.80 cm/s  TR Vmax:        243.00 cm/s MV A velocity: 51.40 cm/s MV E/A ratio:  1.12        SHUNTS  Systemic VTI:  0.17 m                            Systemic Diam: 2.30 cm Kate Sable MD Electronically signed by Kate Sable MD Signature Date/Time: 04/14/2020/3:14:22 PM    Final      Labs:   Basic Metabolic Panel: Recent Labs  Lab 04/13/20 1833 04/13/20 1833 04/14/20 4034 04/14/20 7425 04/15/20 9563 04/15/20 8756 04/16/20 0435 04/16/20 0435 04/17/20 0411 04/18/20 0405  NA 139   < > 139  --  137  --  135  --  132* 135  K 4.0   < > 4.2   < > 3.1*   < > 3.1*   < > 2.8* 3.9  CL 109   < > 110  --  103  --  97*  --  98 104  CO2 20*   < > 24  --  26  --  28  --  28 26  GLUCOSE 111*   < > 136*  --  90  --  87  --  109* 97  BUN 13   < > 11  --  9  --  7  --  9 7  CREATININE 0.84   < > 0.81  --  0.74  --  0.79  --  0.72 0.67  CALCIUM 7.3*   < > 7.2*  --  7.9*  --  8.1*  --  7.9* 7.8*  MG 2.0  --  1.9  --   --   --  1.7  --  1.6* 1.6*  PHOS 2.7   < > 3.0  --  1.8*  --  2.7  --  2.9 2.8   < > = values in this interval not displayed.   GFR Estimated Creatinine Clearance: 108.5 mL/min (by C-G formula based on SCr of 0.67 mg/dL). Liver Function Tests: Recent Labs   Lab 04/12/20 0930 04/13/20 0425 04/13/20 1833 04/14/20 0433 04/15/20 0649  AST 22 18 35 36 67*  ALT 10 10 13 13 21   ALKPHOS 78 76 74 64 61  BILITOT 1.2 0.6 1.3* 0.7 0.7  PROT 5.9* 6.0* 5.8* 5.2* 6.0*  ALBUMIN 2.5* 2.6* 2.5* 2.2* 2.4*   No results for input(s): LIPASE, AMYLASE in the last 168 hours. No results for input(s): AMMONIA in the last 168 hours. Coagulation profile Recent Labs  Lab 04/12/20 0207 04/13/20 0425  INR 1.2 1.1    CBC: Recent Labs  Lab 04/12/20 0930 04/12/20 1612 04/14/20 0433 04/15/20 0649 04/16/20 0435 04/17/20 0411 04/18/20 0405  WBC 19.9*   < > 20.6* 19.2* 19.1* 15.1* 14.7*  NEUTROABS 17.0*  --  18.4*  --  15.4* 11.4* 10.7*  HGB 9.9*   < > 10.7* 10.7* 12.4* 11.7* 11.4*  HCT 30.7*   < > 31.8* 32.3* 36.8* 34.3* 33.4*  MCV 91.4   < > 90.9 91.0 90.6 89.3 90.0  PLT 217   < > 169 210 271 311 365   < > = values in this interval not displayed.   Cardiac Enzymes: No results for input(s): CKTOTAL, CKMB, CKMBINDEX, TROPONINI in the last 168 hours. BNP: Invalid input(s): POCBNP CBG: Recent Labs  Lab 04/17/20 2002 04/18/20 0005 04/18/20 0330 04/18/20 0751 04/18/20 1158  GLUCAP 112* 97 104* 94 117*   D-Dimer No results for input(s): DDIMER in the last 72 hours. Hgb A1c No results for input(s): HGBA1C in the last 72  hours. Lipid Profile No results for input(s): CHOL, HDL, LDLCALC, TRIG, CHOLHDL, LDLDIRECT in the last 72 hours. Thyroid function studies No results for input(s): TSH, T4TOTAL, T3FREE, THYROIDAB in the last 72 hours.  Invalid input(s): FREET3 Anemia work up No results for input(s): VITAMINB12, FOLATE, FERRITIN, TIBC, IRON, RETICCTPCT in the last 72 hours. Microbiology Recent Results (from the past 240 hour(s))  Blood culture (routine x 2)     Status: None   Collection Time: 04/12/20  2:07 AM   Specimen: BLOOD  Result Value Ref Range Status   Specimen Description BLOOD LINE RIGHT HAND  Final   Special Requests   Final     BOTTLES DRAWN AEROBIC AND ANAEROBIC Blood Culture results may not be optimal due to an inadequate volume of blood received in culture bottles   Culture   Final    NO GROWTH 5 DAYS Performed at [facility], [address] [place], [place] [address]    Report Status 04/17/2020 FINAL  Final  Blood culture (routine x 2)     Status: None   Collection Time: 04/12/20  2:07 AM   Specimen: BLOOD  Result Value Ref Range Status   Specimen Description BLOOD RIGHT HAND  Final   Special Requests   Final    BOTTLES DRAWN AEROBIC AND ANAEROBIC Blood Culture adequate volume   Culture   Final    NO GROWTH 5 DAYS Performed at [facility], [address] [place], [place] [address]    Report Status 04/17/2020 FINAL  Final  Respiratory Panel by RT PCR (Flu A&B, Covid) - Nasopharyngeal Swab     Status: None   Collection Time: 04/12/20  2:07 AM   Specimen: Nasopharyngeal Swab  Result Value Ref Range Status   SARS Coronavirus 2 by RT PCR NEGATIVE NEGATIVE Final    Comment: (NOTE) SARS-CoV-2 target nucleic acids are NOT DETECTED.  The SARS-CoV-2 RNA is generally detectable in upper respiratoy specimens during the acute phase of infection. The lowest concentration of SARS-CoV-2 viral copies this assay can detect is 131 copies/mL. A negative result does not preclude SARS-Cov-2 infection and should not be used as the sole basis for treatment or other patient management decisions. A negative result may occur with  improper specimen collection/handling, submission of specimen other than nasopharyngeal swab, presence of viral mutation(s) within the areas targeted by this assay, and inadequate number of viral copies (<131 copies/mL). A negative result must be combined with clinical observations, patient history, and epidemiological information. The expected result is Negative.  Fact Sheet for Patients:  PinkCheek.be  Fact Sheet for Healthcare Providers:   GravelBags.it  This test is no t yet approved or cleared by the [place] FDA and  has been authorized for detection and/or diagnosis of SARS-CoV-2 by FDA under an Emergency Use Authorization (EUA). This EUA will remain  in effect (meaning this test can be used) for the duration of the COVID-19 declaration under Section 564(b)(1) of the Act, 21 U.S.C. section 360bbb-3(b)(1), unless the authorization is terminated or revoked sooner.     Influenza A by PCR NEGATIVE NEGATIVE Final   Influenza B by PCR NEGATIVE NEGATIVE Final    Comment: (NOTE) The Xpert Xpress SARS-CoV-2/FLU/RSV assay is intended as an aid in  the diagnosis of influenza from Nasopharyngeal swab specimens and  should not be used as a sole basis for treatment. Nasal washings and  aspirates are unacceptable for Xpert Xpress SARS-CoV-2/FLU/RSV  testing.  Fact Sheet for Patients: PinkCheek.be  Fact  Sheet for Healthcare Providers: GravelBags.it  This test is not yet approved or cleared by the [place] and  has been authorized for detection and/or diagnosis of SARS-CoV-2 by  FDA under an Emergency Use Authorization (EUA). This EUA will remain  in effect (meaning this test can be used) for the duration of the  Covid-19 declaration under Section 564(b)(1) of the Act, 21  U.S.C. section 360bbb-3(b)(1), unless the authorization is  terminated or revoked. Performed at [facility], [address] [place], [place] [address]   MRSA PCR Screening     Status: None   Collection Time: 04/12/20  6:14 AM   Specimen: Nasopharyngeal  Result Value Ref Range Status   MRSA by PCR NEGATIVE NEGATIVE Final    Comment:        The GeneXpert MRSA Assay (FDA approved for NASAL specimens only), is one component of a comprehensive MRSA colonization surveillance program. It is not intended to diagnose MRSA infection nor to guide  or monitor treatment for MRSA infections. Performed at [facility], [address], [place], [place] [address]      Discharge Instructions:   Discharge Instructions    Diet - low sodium heart healthy   Complete by: As directed    Discharge instructions   Complete by: As directed    Avoid alcohol, tobacco products, cocaine and any other illicit drugs.   Discharge wound care:   Complete by: As directed    Keep wound clean and dry   Increase activity slowly   Complete by: As directed      Allergies as of 04/18/2020      Reactions   Ibuprofen Other (See Comments)   Stomach upset      Medication List    STOP taking these medications   doxycycline 100 MG tablet Commonly known as: VIBRA-TABS   neomycin-bacitracin-polymyxin Oint Commonly known as: NEOSPORIN     TAKE these medications   acetaminophen 325 MG tablet Commonly known as: TYLENOL Take 2 tablets (650 mg total) by mouth every 6 (six) hours as needed for up to 5 days for mild pain.   amLODipine 5 MG tablet Commonly known as: NORVASC Take 1 tablet (5 mg total) by mouth daily. Start taking on: April 19, 2020   celecoxib 200 MG capsule Commonly known as: CeleBREX Take one tab po BID What changed:   how much to take  how to take this  when to take this  Another medication with the same name was removed. Continue taking this medication, and follow the directions you see here.   ciprofloxacin 500 MG tablet Commonly known as: CIPRO Take 1 tablet (500 mg total) by mouth 2 (two) times daily for 3 days.   folic acid 1 MG tablet Commonly known as: FOLVITE Take 1 tablet (1 mg total) by mouth daily. Start taking on: April 19, 2020   gabapentin 300 MG capsule Commonly known as: Neurontin Take one cap po QHS What changed:   how much to take  how to take this  when to take this  Another medication with the same name was removed. Continue taking this medication, and follow the  directions you see here.   metroNIDAZOLE 500 MG tablet Commonly known as: FLAGYL Take 1 tablet (500 mg total) by mouth every 8 (eight) hours for 3 days.   multivitamin with minerals Tabs tablet Take 1 tablet by mouth daily. Start taking on: April 19, 2020   nicotine 21 mg/24hr patch Commonly known  as: NICODERM CQ - dosed in mg/24 hours Place 1 patch (21 mg total) onto the skin daily. Start taking on: April 19, 2020   thiamine 100 MG tablet Take 1 tablet (100 mg total) by mouth daily. Start taking on: April 19, 2020            Durable Medical Equipment  (From admission, onward)         Start     Ordered   04/17/20 1331  For home use only DME Walker rolling  Once       Question Answer Comment  Walker: With [place] Wheels   Patient needs a walker to treat with the following condition Weakness      04/17/20 1331           Discharge Care Instructions  (From admission, onward)         Start     Ordered   04/18/20 0000  Discharge wound care:       Comments: Keep wound clean and dry   04/18/20 1149          Follow-up Information    Pabon, Marjory Lies, MD. Go on 04/29/2020.   Specialty: General Surgery Why: 2:15pm appointment Contact information: [address] [place] [address] [phone]                Time coordinating discharge: 35 minutes  Signed:  Jennye Boroughs  [facility] 04/18/2020, 4:56 PM   Pager on [email or web address]. If 7PM-7AM, please contact night-coverage at [email or web address]

## 2020-04-18 NOTE — Consult Note (Signed)
[facility] for Electrolyte Monitoring and Replacement   Recent Labs: Potassium (mmol/L)  Date Value  04/18/2020 3.9   Magnesium (mg/dL)  Date Value  04/18/2020 1.6 (L)   Calcium (mg/dL)  Date Value  04/18/2020 7.8 (L)   Albumin (g/dL)  Date Value  04/15/2020 2.4 (L)  03/14/2020 4.1   Phosphorus (mg/dL)  Date Value  04/18/2020 2.8   Sodium (mmol/L)  Date Value  04/18/2020 135  03/14/2020 140   Corrected calcium: 9.18 mg/dL   Assessment: Patient is a 59 y/o M with medical history including hypertension, hidradenitis suppurativa, EtOH and tobacco abuse who is admitted with splenic hemorrhage and perineal cellulitis. Patient is now POD # 5 from exploratory laparotomy with splenectomy. Pharmacy has been consulted to assist with electrolyte monitoring and replacement. He is having significant pain limiting the amount of IV KCl he can receive  Diet: regular MIVF:   NS + 20 mEq K/L at 50 mL/hr (24 mEq K/day)   Goal of Therapy:  Electrolytes within normal limits  Plan:   2 grams IV magnesium sulfate x 1  Fluids stopped  The patient has discharge orders for today  Re-check all electrolytes with AM labs if the discharge is cancelled  Dallie Piles 04/18/2020 7:19 AM

## 2020-04-22 ENCOUNTER — Telehealth: Payer: Self-pay | Admitting: Surgery

## 2020-04-22 NOTE — Telephone Encounter (Signed)
Patient is stating he has to have vaccines due to having spleen removed.  He was instructed to call [place] financial assistance to see if the program covers the vaccines he is questioning.

## 2020-04-22 NOTE — Telephone Encounter (Signed)
Patient is calling and has some questions and is asking if one of the nurses would give him a call back. Please call patient and advise.

## 2020-04-24 ENCOUNTER — Ambulatory Visit: Payer: Self-pay

## 2020-04-29 ENCOUNTER — Encounter: Payer: Self-pay | Admitting: Surgery

## 2020-04-29 ENCOUNTER — Other Ambulatory Visit: Payer: Self-pay

## 2020-04-29 ENCOUNTER — Ambulatory Visit (INDEPENDENT_AMBULATORY_CARE_PROVIDER_SITE_OTHER): Payer: Self-pay | Admitting: Surgery

## 2020-04-29 VITALS — BP 122/84 | HR 64 | Temp 98.3°F | Ht 71.0 in | Wt 165.8 lb

## 2020-04-29 DIAGNOSIS — Z09 Encounter for follow-up examination after completed treatment for conditions other than malignant neoplasm: Secondary | ICD-10-CM

## 2020-04-29 NOTE — Patient Instructions (Addendum)
Patient advised to refrain from any heavy lifting, bending, pushing or pulling for a total of 6 weeks after surgery.  Patient had staples removed and steri strips applied at today's visit. Patient made aware that the steri strips will peel and fall off on their own.  Pharmacy recommended: Note - he will need to get Meningococcal serogroup B as outpatient (Both 1st dose followed by 2nd dose 8 weeks)  At 8 weeks will need:  2nd dose of meningococcal ACWY (Menveo)   Pneumococcal-23  (Pneumovax-23)   GENERAL POST-OPERATIVE PATIENT INSTRUCTIONS   WOUND CARE INSTRUCTIONS:  Keep a dry clean dressing on the wound if there is drainage. The initial bandage may be removed after 24 hours.  Once the wound has quit draining you may leave it open to air.  If clothing rubs against the wound or causes irritation and the wound is not draining you may cover it with a dry dressing during the daytime.  Try to keep the wound dry and avoid ointments on the wound unless directed to do so.  If the wound becomes bright red and painful or starts to drain infected material that is not clear, please contact your physician immediately.  If the wound is mildly pink and has a thick firm ridge underneath it, this is normal, and is referred to as a healing ridge.  This will resolve over the next 4-6 weeks.  BATHING: You may shower if you have been informed of this by your surgeon. However, Please do not submerge in a tub, hot tub, or pool until incisions are completely sealed or have been told by your surgeon that you may do so.  DIET:  You may eat any foods that you can tolerate.  It is a good idea to eat a high fiber diet and take in plenty of fluids to prevent constipation.  If you do become constipated you may want to take a mild laxative or take ducolax tablets on a daily basis until your bowel habits are regular.  Constipation can be very uncomfortable, along with straining, after recent surgery.  ACTIVITY:  You are  encouraged to cough and deep breath or use your incentive spirometer if you were given one, every 15-30 minutes when awake.  This will help prevent respiratory complications and low grade fevers post-operatively if you had a general anesthetic.  You may want to hug a pillow when coughing and sneezing to add additional support to the surgical area, if you had abdominal or chest surgery, which will decrease pain during these times.  You are encouraged to walk and engage in light activity for the next two weeks.  You should not lift more than 20 pounds, until 4 to 6 weeks after surgery as it could put you at increased risk for complications.  Twenty pounds is roughly equivalent to a plastic bag of groceries. At that time- Listen to your body when lifting, if you have pain when lifting, stop and then try again in a few days. Soreness after doing exercises or activities of daily living is normal as you get back in to your normal routine.  MEDICATIONS:  Try to take narcotic medications and anti-inflammatory medications, such as tylenol, ibuprofen, naprosyn, etc., with food.  This will minimize stomach upset from the medication.  Should you develop nausea and vomiting from the pain medication, or develop a rash, please discontinue the medication and contact your physician.  You should not drive, make important decisions, or operate machinery when taking narcotic pain  medication.  SUNBLOCK Use sun block to incision area over the next year if this area will be exposed to sun. This helps decrease scarring and will allow you avoid a permanent darkened area over your incision.  QUESTIONS:  Please feel free to call our office if you have any questions, and we will be glad to assist you. [phone].

## 2020-04-29 NOTE — Progress Notes (Signed)
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 He is doing very well. No fevers no chills. He is ambulating tolerating regular diet.  Physical exam: He is in no acute distress. Awake and alert. Abdomen: Soft staples in place no evidence of infection. No evidence of complication. No peritonitis. I remove the staplers and place Steri-Strips.  A/P doing very well. He got the first dose of vaccination the hospital but we will arrange for a second and third boost in the outpatient basis. We will contact free clinic since unfortunately he is not insured. There are no surgical issues at this time. Follow-up as needed

## 2020-05-08 ENCOUNTER — Other Ambulatory Visit: Payer: Self-pay

## 2020-05-08 ENCOUNTER — Ambulatory Visit: Payer: Self-pay | Admitting: Gerontology

## 2020-05-08 ENCOUNTER — Encounter: Payer: Self-pay | Admitting: Adult Health

## 2020-05-08 VITALS — BP 122/86 | HR 71 | Wt 163.2 lb

## 2020-05-08 DIAGNOSIS — I10 Essential (primary) hypertension: Secondary | ICD-10-CM

## 2020-05-08 DIAGNOSIS — L732 Hidradenitis suppurativa: Secondary | ICD-10-CM

## 2020-05-08 MED ORDER — ADULT MULTIVITAMIN W/MINERALS CH: 1.0000 | ORAL_TABLET | Freq: Every day | ORAL | 3 refills | Status: DC

## 2020-05-08 MED ORDER — AMLODIPINE BESYLATE 5 MG PO TABS: 5.0000 mg | ORAL_TABLET | Freq: Every day | ORAL | 3 refills | Status: DC

## 2020-05-08 MED ORDER — THIAMINE HCL 100 MG PO TABS: 100.0000 mg | ORAL_TABLET | Freq: Every day | ORAL | 3 refills | Status: DC

## 2020-05-08 MED ORDER — FOLIC ACID 1 MG PO TABS: 1.0000 mg | ORAL_TABLET | Freq: Every day | ORAL | 3 refills | Status: DC

## 2020-05-08 NOTE — Patient Instructions (Signed)
DASH Eating Plan DASH stands for "Dietary Approaches to Stop Hypertension." The DASH eating plan is a healthy eating plan that has been shown to reduce high blood pressure (hypertension). It may also reduce your risk for type 2 diabetes, heart disease, and stroke. The DASH eating plan may also help with weight loss. What are tips for following this plan?  General guidelines  Avoid eating more than 2,300 mg (milligrams) of salt (sodium) a day. If you have hypertension, you may need to reduce your sodium intake to 1,500 mg a day.  Limit alcohol intake to no more than 1 drink a day for nonpregnant women and 2 drinks a day for men. One drink equals 12 oz of beer, 5 oz of wine, or 1 oz of hard liquor.  Work with your health care provider to maintain a healthy body weight or to lose weight. Ask what an ideal weight is for you.  Get at least 30 minutes of exercise that causes your heart to beat faster (aerobic exercise) most days of the week. Activities may include walking, swimming, or biking.  Work with your health care provider or diet and nutrition specialist (dietitian) to adjust your eating plan to your individual calorie needs. Reading food labels   Check food labels for the amount of sodium per serving. Choose foods with less than 5 percent of the Daily Value of sodium. Generally, foods with less than 300 mg of sodium per serving fit into this eating plan.  To find whole grains, look for the word "whole" as the first word in the ingredient list. Shopping  Buy products labeled as "low-sodium" or "no salt added."  Buy fresh foods. Avoid canned foods and premade or frozen meals. Cooking  Avoid adding salt when cooking. Use salt-free seasonings or herbs instead of table salt or sea salt. Check with your health care provider or pharmacist before using salt substitutes.  Do not fry foods. Cook foods using healthy methods such as baking, boiling, grilling, and broiling instead.  Cook with  heart-healthy oils, such as olive, canola, soybean, or sunflower oil. Meal planning  Eat a balanced diet that includes: ? 5 or more servings of fruits and vegetables each day. At each meal, try to fill half of your plate with fruits and vegetables. ? Up to 6-8 servings of whole grains each day. ? Less than 6 oz of lean meat, poultry, or fish each day. A 3-oz serving of meat is about the same size as a deck of cards. One egg equals 1 oz. ? 2 servings of low-fat dairy each day. ? A serving of nuts, seeds, or beans 5 times each week. ? Heart-healthy fats. Healthy fats called Omega-3 fatty acids are found in foods such as flaxseeds and coldwater fish, like sardines, salmon, and mackerel.  Limit how much you eat of the following: ? Canned or prepackaged foods. ? Food that is high in trans fat, such as fried foods. ? Food that is high in saturated fat, such as fatty meat. ? Sweets, desserts, sugary drinks, and other foods with added sugar. ? Full-fat dairy products.  Do not salt foods before eating.  Try to eat at least 2 vegetarian meals each week.  Eat more home-cooked food and less restaurant, buffet, and fast food.  When eating at a restaurant, ask that your food be prepared with less salt or no salt, if possible. What foods are recommended? The items listed may not be a complete list. Talk with your dietitian about   what dietary choices are best for you. Grains Whole-grain or whole-wheat bread. Whole-grain or whole-wheat pasta. Brown rice. Oatmeal. Quinoa. Bulgur. Whole-grain and low-sodium cereals. Pita bread. Low-fat, low-sodium crackers. Whole-wheat flour tortillas. Vegetables Fresh or frozen vegetables (raw, steamed, roasted, or grilled). Low-sodium or reduced-sodium tomato and vegetable juice. Low-sodium or reduced-sodium tomato sauce and tomato paste. Low-sodium or reduced-sodium canned vegetables. Fruits All fresh, dried, or frozen fruit. Canned fruit in natural juice (without  added sugar). Meat and other protein foods Skinless chicken or turkey. Ground chicken or turkey. Pork with fat trimmed off. Fish and seafood. Egg whites. Dried beans, peas, or lentils. Unsalted nuts, nut butters, and seeds. Unsalted canned beans. Lean cuts of beef with fat trimmed off. Low-sodium, lean deli meat. Dairy Low-fat (1%) or fat-free (skim) milk. Fat-free, low-fat, or reduced-fat cheeses. Nonfat, low-sodium ricotta or cottage cheese. Low-fat or nonfat yogurt. Low-fat, low-sodium cheese. Fats and oils Soft margarine without trans fats. Vegetable oil. Low-fat, reduced-fat, or light mayonnaise and salad dressings (reduced-sodium). Canola, safflower, olive, soybean, and sunflower oils. Avocado. Seasoning and other foods Herbs. Spices. Seasoning mixes without salt. Unsalted popcorn and pretzels. Fat-free sweets. What foods are not recommended? The items listed may not be a complete list. Talk with your dietitian about what dietary choices are best for you. Grains Baked goods made with fat, such as croissants, muffins, or some breads. Dry pasta or rice meal packs. Vegetables Creamed or fried vegetables. Vegetables in a cheese sauce. Regular canned vegetables (not low-sodium or reduced-sodium). Regular canned tomato sauce and paste (not low-sodium or reduced-sodium). Regular tomato and vegetable juice (not low-sodium or reduced-sodium). Pickles. Olives. Fruits Canned fruit in a light or heavy syrup. Fried fruit. Fruit in cream or butter sauce. Meat and other protein foods Fatty cuts of meat. Ribs. Fried meat. Bacon. Sausage. Bologna and other processed lunch meats. Salami. Fatback. Hotdogs. Bratwurst. Salted nuts and seeds. Canned beans with added salt. Canned or smoked fish. Whole eggs or egg yolks. Chicken or turkey with skin. Dairy Whole or 2% milk, cream, and half-and-half. Whole or full-fat cream cheese. Whole-fat or sweetened yogurt. Full-fat cheese. Nondairy creamers. Whipped toppings.  Processed cheese and cheese spreads. Fats and oils Butter. Stick margarine. Lard. Shortening. Ghee. Bacon fat. Tropical oils, such as coconut, palm kernel, or palm oil. Seasoning and other foods Salted popcorn and pretzels. Onion salt, garlic salt, seasoned salt, table salt, and sea salt. Worcestershire sauce. Tartar sauce. Barbecue sauce. Teriyaki sauce. Soy sauce, including reduced-sodium. Steak sauce. Canned and packaged gravies. Fish sauce. Oyster sauce. Cocktail sauce. Horseradish that you find on the shelf. Ketchup. Mustard. Meat flavorings and tenderizers. Bouillon cubes. Hot sauce and Tabasco sauce. Premade or packaged marinades. Premade or packaged taco seasonings. Relishes. Regular salad dressings. Where to find more information:  National Heart, Lung, and Blood Institute: www.nhlbi.nih.gov  American Heart Association: www.heart.org Summary  The DASH eating plan is a healthy eating plan that has been shown to reduce high blood pressure (hypertension). It may also reduce your risk for type 2 diabetes, heart disease, and stroke.  With the DASH eating plan, you should limit salt (sodium) intake to 2,300 mg a day. If you have hypertension, you may need to reduce your sodium intake to 1,500 mg a day.  When on the DASH eating plan, aim to eat more fresh fruits and vegetables, whole grains, lean proteins, low-fat dairy, and heart-healthy fats.  Work with your health care provider or diet and nutrition specialist (dietitian) to adjust your eating plan to your   individual calorie needs. This information is not intended to replace advice given to you by your health care provider. Make sure you discuss any questions you have with your health care provider. Document Revised: 05/07/2017 Document Reviewed: 05/18/2016 Elsevier Patient Education  2020 [employer]  

## 2020-05-08 NOTE — Progress Notes (Signed)
Established Patient Office Visit  Subjective:  Patient ID: Caleb Brooks, male    DOB: 11-09-1960  Age: 59 y.o. MRN: [id number]  CC:  Chief Complaint  Patient presents with  . Cyst    boils  . Follow-up    Spleen removal   . Medication Refill    needs to review medications and get refill     HPI Caleb Brooks presents for f/u after hospital discharge and medication refill. 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 The Surgical site has completely healed and he followed up with General Surgery Dr Dahlia Byes on 04/29/2020. He denies fever nor chills. [facility]. He states that he's compliant with his medication and continues to make healthy lifestyle changes. He states that he has painful keloid to perineal region and an Hidradenitis site with purulent discharge. Overall, he states that he's doing well and offers no further complaint.  Past Medical History:  Diagnosis Date  . Anxiety   . Arthritis   . Hypertension     Past Surgical History:  Procedure Laterality Date  . CYST REMOVAL TRUNK     Groin area around 2000  . EMBOLIZATION N/A 04/12/2020   Procedure: EMBOLIZATION, spleen;  Surgeon: Algernon Huxley, MD;  Location: [facility];  Service: Cardiovascular;  Laterality: N/A;  . INCISION AND DRAINAGE ABSCESS N/A 07/26/2019   Procedure: INCISION AND DRAINAGE ABSCESS;  Surgeon: Hollice Espy, MD;  Location: [facility];  Service: Urology;  Laterality: N/A;  . INCISION AND DRAINAGE ABSCESS N/A 07/26/2019   Procedure: INCISION AND DRAINAGE ABSCESS;  Surgeon: Benjamine Sprague, DO;  Location: [facility];  Service: General;  Laterality: N/A;  . LAPAROTOMY N/A 04/13/2020   Procedure: EXPLORATORY LAPAROTOMY;  Surgeon: Jules Husbands, MD;  Location: [facility];  Service: General;  Laterality: N/A;    Family History  Problem Relation Age of Onset  . Diabetes Father   . Hypertension Father   . Congestive Heart Failure Father    . Hypertension Sister   . CVA Sister   . Bipolar disorder Brother   . Schizophrenia Brother     Social History   Socioeconomic History  . Marital status: Single    Spouse name: Not on file  . Number of children: Not on file  . Years of education: Not on file  . Highest education level: Not on file  Occupational History  . Occupation: unemployed  Tobacco Use  . Smoking status: Current Every Day Smoker    Packs/day: 0.50    Years: 30.00    Pack years: 15.00    Types: Cigarettes  . Smokeless tobacco: Never Used  . Tobacco comment: 1/2- 1 pack per day  Vaping Use  . Vaping Use: Never used  Substance and Sexual Activity  . Alcohol use: Yes    Comment: 32 oz a day  . Drug use: Yes    Types: Marijuana  . Sexual activity: Not on file  Other Topics Concern  . Not on file  Social History Narrative  . Not on file   Social Determinants of Health   Financial Resource Strain: Unknown  . Difficulty of Paying Living Expenses: Patient refused  Food Insecurity:   . Worried About Charity fundraiser in the Last Year: Not on file  . Ran Out of Food in the Last Year: Not on file  Transportation Needs: No Transportation Needs  . Lack of Transportation (Medical): No  .  Lack of Transportation (Non-Medical): No  Physical Activity:   . Days of Exercise per Week: Not on file  . Minutes of Exercise per Session: Not on file  Stress: Stress Concern Present  . Feeling of Stress : Very much  Social Connections:   . Frequency of Communication with Friends and Family: Not on file  . Frequency of Social Gatherings with Friends and Family: Not on file  . Attends Religious Services: Not on file  . Active Member of Clubs or Organizations: Not on file  . Attends Archivist Meetings: Not on file  . Marital Status: Not on file  Intimate Partner Violence:   . Fear of Current or Ex-Partner: Not on file  . Emotionally Abused: Not on file  . Physically Abused: Not on file  . Sexually  Abused: Not on file    Outpatient Medications Prior to Visit  Medication Sig Dispense Refill  . celecoxib (CELEBREX) 200 MG capsule Take one tab po BID (Patient taking differently: Take 200 mg by mouth 2 (two) times daily. Take one tab po BID) 60 capsule 3  . gabapentin (NEURONTIN) 300 MG capsule Take one cap po QHS (Patient taking differently: Take 300 mg by mouth at bedtime. Take one cap po QHS) 30 capsule 3  . nicotine (NICODERM CQ - DOSED IN MG/24 HOURS) 21 mg/24hr patch Place 1 patch (21 mg total) onto the skin daily. (Patient not taking: Reported on 04/29/2020) 28 patch 0  . amLODipine (NORVASC) 5 MG tablet Take 1 tablet (5 mg total) by mouth daily. 30 tablet 0  . folic acid (FOLVITE) 1 MG tablet Take 1 tablet (1 mg total) by mouth daily. 30 tablet 0  . Multiple Vitamin (MULTIVITAMIN WITH MINERALS) TABS tablet Take 1 tablet by mouth daily. 30 tablet 0  . thiamine 100 MG tablet Take 1 tablet (100 mg total) by mouth daily. 30 tablet 0   No facility-administered medications prior to visit.    Allergies  Allergen Reactions  . Ibuprofen Other (See Comments)    Stomach upset    ROS Review of Systems  Constitutional: Negative.   Respiratory: Negative.   Cardiovascular: Negative.   Skin:       Keloid and hydradenitits to perineal area  Neurological: Negative.   Psychiatric/Behavioral: Negative.       Objective:    Physical Exam HENT:     Head: Normocephalic and atraumatic.  Cardiovascular:     Rate and Rhythm: Normal rate and regular rhythm.     Pulses: Normal pulses.     Heart sounds: Normal heart sounds.  Pulmonary:     Effort: Pulmonary effort is normal.     Breath sounds: Normal breath sounds.  Genitourinary:    Comments: Purulent drainage to right perineal hidradenitis site. Skin:    General: Skin is warm.  Neurological:     General: No focal deficit present.     Mental Status: He is alert and oriented to person, place, and time. Mental status is at baseline.   Psychiatric:        Mood and Affect: Mood normal.        Behavior: Behavior normal.        Thought Content: Thought content normal.        Judgment: Judgment normal.     BP 122/86 (BP Location: Right Arm, Patient Position: Sitting)   Pulse 71   Wt 163 lb 3.2 oz (74 kg)   SpO2 99%   BMI 22.76 kg/m  Wt  Readings from Last 3 Encounters:  05/08/20 163 lb 3.2 oz (74 kg)  04/29/20 165 lb 12.8 oz (75.2 kg)  04/12/20 168 lb 6.9 oz (76.4 kg)     Health Maintenance Due  Topic Date Due  . COLONOSCOPY  Never done  . COVID-19 Vaccine (2 - Pfizer 2-dose series) 12/29/2019    There are no preventive care reminders to display for this patient.  Lab Results  Component Value Date   TSH 2.91 01/01/2015   Lab Results  Component Value Date   WBC 14.7 (H) 04/18/2020   HGB 11.4 (L) 04/18/2020   HCT 33.4 (L) 04/18/2020   MCV 90.0 04/18/2020   PLT 365 04/18/2020   Lab Results  Component Value Date   NA 135 04/18/2020   K 3.9 04/18/2020   CO2 26 04/18/2020   GLUCOSE 97 04/18/2020   BUN 7 04/18/2020   CREATININE 0.67 04/18/2020   BILITOT 0.7 04/15/2020   ALKPHOS 61 04/15/2020   AST 67 (H) 04/15/2020   ALT 21 04/15/2020   PROT 6.0 (L) 04/15/2020   ALBUMIN 2.4 (L) 04/15/2020   CALCIUM 7.8 (L) 04/18/2020   ANIONGAP 5 04/18/2020   Lab Results  Component Value Date   CHOL 141 01/25/2019   Lab Results  Component Value Date   HDL 60 01/25/2019   Lab Results  Component Value Date   LDLCALC 67 01/25/2019   Lab Results  Component Value Date   TRIG 85 04/13/2020   Lab Results  Component Value Date   CHOLHDL 2.4 01/25/2019   Lab Results  Component Value Date   HGBA1C 5.7 (H) 04/13/2020      Assessment & Plan:   1. Hidradenitis suppurativa - He was advised to notify Dermatologist  Dr Nehemiah Massed and to go to the ED for worsening symptoms.  2. Essential hypertension - His blood pressure is under control and he will continue on current treatment regimen, DASH diet and  continue using nicotine patch. - amLODipine (NORVASC) 5 MG tablet; Take 1 tablet (5 mg total) by mouth daily.  Dispense: 30 tablet; Refill: 3 - folic acid (FOLVITE) 1 MG tablet; Take 1 tablet (1 mg total) by mouth daily.  Dispense: 30 tablet; Refill: 3 - Multiple Vitamin (MULTIVITAMIN WITH MINERALS) TABS tablet; Take 1 tablet by mouth daily.  Dispense: 30 tablet; Refill: 3 - thiamine 100 MG tablet; Take 1 tablet (100 mg total) by mouth daily.  Dispense: 30 tablet; Refill: 3     Follow-up: Return in about 12 weeks (around 07/31/2020), or if symptoms worsen or fail to improve.    Etheridge Geil Jerold Coombe, NP

## 2020-05-10 ENCOUNTER — Other Ambulatory Visit: Payer: Self-pay

## 2020-05-10 ENCOUNTER — Ambulatory Visit (LOCAL_COMMUNITY_HEALTH_CENTER): Payer: Self-pay

## 2020-05-10 DIAGNOSIS — Z23 Encounter for immunization: Secondary | ICD-10-CM

## 2020-05-10 NOTE — Progress Notes (Addendum)
Pt states his spleen has been removed and his PCP has instructed him to get Men B (recently received several other vaccines - see Epic record). Consulted with provider regarding pt request for Bexsero. Per verbal order by Dr. Lauretta Chester, administered Bexsero today, and pt can return to clinic in 4 weeks to receive his second/last dose to complete the Bexsero series.

## 2020-05-20 ENCOUNTER — Other Ambulatory Visit: Payer: Self-pay

## 2020-05-20 ENCOUNTER — Ambulatory Visit: Payer: Self-pay | Admitting: Pharmacist

## 2020-05-20 DIAGNOSIS — Z79899 Other long term (current) drug therapy: Secondary | ICD-10-CM

## 2020-05-20 NOTE — Progress Notes (Signed)
[facility] Visit Note  Patient: Caleb Brooks MRN: [id number] Date of Birth: 1960-08-16 PCP: Caleb Reusing, NP    Caleb Brooks 59 y.o. male, contacted today for his annual medication review via telephone. He is status post splenectomy 04/13/20.  There were no vitals taken for this visit.  Patient Information   Past Medical History:  Diagnosis Date  . Anxiety   . Arthritis   . Hypertension       Past Surgical History:  Procedure Laterality Date  . CYST REMOVAL TRUNK     Groin area around 2000  . EMBOLIZATION N/A 04/12/2020   Procedure: EMBOLIZATION, spleen;  Surgeon: Caleb Huxley, MD;  Location: [facility];  Service: Cardiovascular;  Laterality: N/A;  . INCISION AND DRAINAGE ABSCESS N/A 07/26/2019   Procedure: INCISION AND DRAINAGE ABSCESS;  Surgeon: Caleb Espy, MD;  Location: [facility];  Service: Urology;  Laterality: N/A;  . INCISION AND DRAINAGE ABSCESS N/A 07/26/2019   Procedure: INCISION AND DRAINAGE ABSCESS;  Surgeon: Caleb Sprague, DO;  Location: [facility];  Service: General;  Laterality: N/A;  . LAPAROTOMY N/A 04/13/2020   Procedure: EXPLORATORY LAPAROTOMY;  Surgeon: Caleb Husbands, MD;  Location: [facility];  Service: General;  Laterality: N/A;     Family History  Problem Relation Age of Onset  . Diabetes Father   . Hypertension Father   . Congestive Heart Failure Father   . Hypertension Sister   . CVA Sister   . Bipolar disorder Brother   . Schizophrenia Brother     New Diagnoses (since last visit):   Family Support: Good  Lifestyle Diet: Eats smalls meals throughout day. Drinks a lot of water.           Social History   Substance and Sexual Activity  Alcohol Use Not Currently   Comment: 32 oz a day      Social History   Tobacco Use  Smoking Status Current Every Day Smoker  . Packs/day: 0.50  . Years: 30.00  . Pack years: 15.00  . Types: Cigarettes  Smokeless Tobacco Never Used  Tobacco Comment   1/2- 1  pack per day      Health Maintenance  Topic Date Due  . COLONOSCOPY  Never done  . COVID-19 Vaccine (2 - Pfizer 3-dose booster series) 12/29/2019  . TETANUS/TDAP  04/18/2030  . INFLUENZA VACCINE  Completed  . Hepatitis C Screening  Completed  . HIV Screening  Completed    Health Maintenance/Date Completed  Last ED visit: 04/12/2020 Last Visit to PCP: 05/08/2020 Next Visit to PCP: 07/31/2020 Specialist Visit: 07/15/2020 (Dermatologist)   Outpatient Encounter Medications as of 05/20/2020  Medication Sig  . amLODipine (NORVASC) 5 MG tablet Take 1 tablet (5 mg total) by mouth daily.  . celecoxib (CELEBREX) 200 MG capsule Take one tab po BID (Patient taking differently: Take 200 mg by mouth 2 (two) times daily.)  . doxycycline (VIBRAMYCIN) 100 MG capsule Take 100 mg by mouth daily.  . folic acid (FOLVITE) 1 MG tablet Take 1 tablet (1 mg total) by mouth daily.  Marland Kitchen gabapentin (NEURONTIN) 300 MG capsule Take one cap po QHS (Patient taking differently: Take 300 mg by mouth at bedtime.)  . Multiple Vitamin (MULTIVITAMIN WITH MINERALS) TABS tablet Take 1 tablet by mouth daily.  Marland Kitchen thiamine 100 MG tablet Take 1 tablet (100 mg total) by mouth daily.  . nicotine (NICODERM CQ - DOSED IN MG/24 HOURS) 21 mg/24hr patch Place 1 patch (21  mg total) onto the skin daily. (Patient not taking: No sig reported)   No facility-administered encounter medications on file as of 05/20/2020.    Assessment and Plan:  Adherence:  Takes medications as prescribed. States he does not miss any dosages. [facility] is assisting with medication access.   Hypertension: Currently on amlodipine. BP not assessed at telehealth visit.  Vitamin Supplementation: Currently on thiamine, folic acid and a multi-vitamin  Hidradenitis suppurativa/Boils/Cysts/Keloids of the groin and buttocks region: Followed by Caleb Brooks. Next appointment is 07/16/19. Current regimen for pain includes gabapentin and celecoxib.  Recently on doxycycline, however this was discontinued while he was in the hospital, then refilled 05/16/20 at [facility]. States he currently has some drainage.  Smoking Cessation: Smoking 1 pack every 2-3 days. He has applied for nicotine replacement and cessation assistance through the [place].  Vaccinations s/p splenectomy: Per Caleb Brooks (PharmD) note on 04/18/20  "On day of discharge (04/18/2020), patient given:  Meningococcal A,C,W,Y (Menveo)  Pneumococcal-13 Conjugate (Prevnar-13)  Haemophilus B conjugate (Hiberix)  Seasonal influenza  TdAP  Note - he will need to get Meningococcal serogroup B as outpatient (Both 1st dose followed by 2nd dose 8 weeks)  At 8 weeks will need:  2nd dose of meningococcal ACWY (Menveo)   Pneumococcal-23  (Pneumovax-23)"  Per Epic Vaccination record, he received the first Meningococcal B vaccine on 05/10/20 and the Pneumovax 23 on 05/08/20.  Per patient, he received the 2-series Pfizer COVID-19 vaccinations and will be obtaining the booster when appropriate.   RTC 1 year- Outreach MTM  Caleb Brooks Caleb Brooks, PharmD [facility] [place] Operations Coordinator [phone]

## 2020-06-04 ENCOUNTER — Telehealth: Payer: Self-pay

## 2020-06-04 NOTE — Telephone Encounter (Signed)
Patient came into office with concerns regarding previous treatments. He currently has Caleb Brooks spots in the groin where HS was treated in the past. He just wanted you to be aware. He is scheduled to come into the office 07/16/19.

## 2020-06-05 NOTE — Telephone Encounter (Signed)
Please advise pt that white spots may be associated with the scarring from his Hidradenitis and scarring is part of the condition and cannot be avoided. We will re-evaluate at his next appt.  Please advise him to keep his scheduled appt. In the meantime, he should continue the prescribed medication given at October 2021 visit. If he needs refills, please send in for him.

## 2020-06-05 NOTE — Telephone Encounter (Signed)
Tried calling patient today. No answer and no option for voicemail.

## 2020-06-05 NOTE — Telephone Encounter (Signed)
Patient advised information per Dr. Gwen Pounds.

## 2020-06-14 ENCOUNTER — Ambulatory Visit: Payer: Self-pay

## 2020-06-20 ENCOUNTER — Other Ambulatory Visit: Payer: Self-pay

## 2020-06-20 ENCOUNTER — Ambulatory Visit (LOCAL_COMMUNITY_HEALTH_CENTER): Payer: Self-pay

## 2020-06-20 DIAGNOSIS — Z23 Encounter for immunization: Secondary | ICD-10-CM

## 2020-06-20 NOTE — Progress Notes (Signed)
Tolerated Men B #2 well today. Updated [facility] copy given. Josie Saunders, RN

## 2020-07-02 ENCOUNTER — Other Ambulatory Visit: Payer: Self-pay | Admitting: Gerontology

## 2020-07-02 DIAGNOSIS — I10 Essential (primary) hypertension: Secondary | ICD-10-CM

## 2020-07-15 ENCOUNTER — Ambulatory Visit: Payer: Self-pay | Admitting: Dermatology

## 2020-07-17 ENCOUNTER — Ambulatory Visit (INDEPENDENT_AMBULATORY_CARE_PROVIDER_SITE_OTHER): Payer: Self-pay | Admitting: Dermatology

## 2020-07-17 ENCOUNTER — Other Ambulatory Visit: Payer: Self-pay

## 2020-07-17 ENCOUNTER — Other Ambulatory Visit: Payer: Self-pay | Admitting: Dermatology

## 2020-07-17 DIAGNOSIS — L732 Hidradenitis suppurativa: Secondary | ICD-10-CM

## 2020-07-17 MED ORDER — ISOTRETINOIN 20 MG PO CAPS: 20.0000 mg | ORAL_CAPSULE | Freq: Every day | ORAL | 0 refills | Status: DC

## 2020-07-17 MED ORDER — GABAPENTIN 300 MG PO CAPS: ORAL_CAPSULE | ORAL | 1 refills | Status: DC

## 2020-07-17 MED ORDER — CELECOXIB 200 MG PO CAPS: ORAL_CAPSULE | ORAL | 1 refills | Status: DC

## 2020-07-17 NOTE — Progress Notes (Signed)
   Follow-Up Visit   Subjective  Caleb Brooks is a 60 y.o. male who presents for the following: Hidradenitis Suppurativa (Groin, buttocks, 73m f/u, itching and draining, Doxycycline 100mg  1 po qd, ran out of Gabapentin 300mg , Celebrex 200mg  1 po bid).  The following portions of the chart were reviewed this encounter and updated as appropriate:   Tobacco  Allergies  Meds  Problems  Med Hx  Surg Hx  Fam Hx     Review of Systems:  No other skin or systemic complaints except as noted in HPI or Assessment and Plan.  Objective  Well appearing patient in no apparent distress; mood and affect are within normal limits.  A focused examination was performed including groin, buttocks. Relevant physical exam findings are noted in the Assessment and Plan.  Objective  buttocks, groin: Fistulas and plaques with drainage of groin, buttocks area   Assessment & Plan  Hidradenitis suppurativa -severe and persistent involving mostly the buttocks and groin with drainage.  The drainage is improved with current regimen but it is persistent and not to goal.  He also has associated pain with his hidradenitis. We have discussed oral antibiotics; surgery; isotretinoin; and Humira in the past. buttocks, groin Discussed isotretinoin as the next logical step as a more aggressive option.  He wants to pursue this and we reviewed his labs filled out paperwork and will initiate treatment.  Hidradenitis Suppurativa is a chronic; persistent; non-curable, but treatable condition due to abnormal inflamed sweat glands in the body folds (axilla, inframammary, groin, medial thighs), causing recurrent painful cysts and scarring. It can be associated with severe scarring acne and cysts; abscesses and scarring of scalp. The goal is control and prevention of flares, as it is not curable. Scars are permanent and can be thickened. Treatment may include daily use of topical medication and oral antibiotics.  Oral isotretinoin may  also be helpful.  For more severe cases, Humira (a biologic injection) may be prescribed to decrease the inflammatory process and prevent flares.  When indicated, inflamed cysts may also be treated surgically.   Labs viewed from 04/18/20  D/c Doxycycline Cont Celebrex 200mg  1 po bid #60 1 rf Cont Gabapentin 300mg  #30 1 rf (He was given refills for Celebrex and gabapentin today.  He was advised to get further refills from his PCP at [facility] in the future.) Start Isotretinoin 20mg  1 po qd with fatty meal Ipledge consent signed Pt registered in [facility] program  [facility] [id number] Pharmacy Medication management  Reviewed potential side effects of isotretinoin including xerosis, cheilitis, hepatitis, hyperlipidemia, and severe birth defects if taken by a pregnant woman. Reviewed reports of suicidal ideation in those with a history of depression while taking isotretinoin and reports of diagnosis of inflammatory bowl disease while taking isotretinoin as well as the lack of evidence for a causal relationship between isotretinoin, depression and IBD. Patient advised to reach out with any questions or concerns. Patient advised not to share pills or donate blood while on treatment or for one month after completing treatment.   Reordered Medications celecoxib (CELEBREX) 200 MG capsule gabapentin (NEURONTIN) 300 MG capsule  Return in about 1 month (around 08/14/2020) for Isotretinoin f/u.  I, Othelia Pulling, RMA, am acting as scribe for [name], MD .  Documentation: I have reviewed the above documentation for accuracy and completeness, and I agree with the above.  [name], MD

## 2020-07-18 ENCOUNTER — Other Ambulatory Visit: Payer: Self-pay | Admitting: Dermatology

## 2020-07-18 ENCOUNTER — Telehealth: Payer: Self-pay

## 2020-07-18 MED ORDER — DOXYCYCLINE HYCLATE 100 MG PO CAPS: 100.0000 mg | ORAL_CAPSULE | Freq: Every day | ORAL | 3 refills | Status: DC

## 2020-07-18 NOTE — Telephone Encounter (Signed)
Advised pt that [facility] pharmacy can not fill the Isotretinoin because they are not registered with the [place] pt that we could send Myorisan to [facility] (10, 20, or 40mg s) for $65 for 30 pills.  Advised that as he advanced in treatment it could go up to $130 a month for 60 pills.  Pt advised he can not afford that each month.  Advised pt he could restart Doxycycline 100mg  1 po qd with food and drink.  He would like [place] to send the Doxycycline in to Medication management.  Advised Doxycycline sent in and we would re-evaluate at f/u appt./sh

## 2020-07-20 ENCOUNTER — Encounter: Payer: Self-pay | Admitting: Dermatology

## 2020-07-31 ENCOUNTER — Other Ambulatory Visit: Payer: Self-pay

## 2020-07-31 ENCOUNTER — Other Ambulatory Visit: Payer: Self-pay | Admitting: Gerontology

## 2020-07-31 ENCOUNTER — Ambulatory Visit: Payer: Self-pay | Admitting: Gerontology

## 2020-07-31 ENCOUNTER — Encounter: Payer: Self-pay | Admitting: Gerontology

## 2020-07-31 VITALS — BP 116/84 | HR 73 | Temp 97.2°F | Resp 16 | Wt 167.7 lb

## 2020-07-31 DIAGNOSIS — L732 Hidradenitis suppurativa: Secondary | ICD-10-CM

## 2020-07-31 DIAGNOSIS — Z8739 Personal history of other diseases of the musculoskeletal system and connective tissue: Secondary | ICD-10-CM | POA: Insufficient documentation

## 2020-07-31 DIAGNOSIS — I10 Essential (primary) hypertension: Secondary | ICD-10-CM

## 2020-07-31 MED ORDER — COLCHICINE 0.6 MG PO TABS: 0.6000 mg | ORAL_TABLET | Freq: Every day | ORAL | 0 refills | Status: DC

## 2020-07-31 NOTE — Progress Notes (Signed)
Established Patient Office Visit  Subjective:  Patient ID: Caleb Brooks, male    DOB: 01/10/1961  Age: 60 y.o. MRN: [id number]  CC: No chief complaint on file.   HPI Caleb Brooks 60 y/o male who has history of hypertension, hidradenitis suppurativa presents for routine follow up . He states that he's compliant with his medications, and continues to make healthy lifestyle changes adhering to DASH diet and working on cutting back cigarette smoking. He was seen by the Dermatologist Dr Nehemiah Massed  D. C on 07/17/20 for his hidradenitis to his perineal area. He will continue on Celebrex 200 mg bid, gabapentin 300 mg and unable to start Isotretinoin because he has no health insurance. Currently, he reports that the lesions to his groin continues to drian minimal amount of sero sanguinous drainage and painful, but it's tolerable. He states that he has a history of gout and experienced swelling , erythema and pain to his left great toe that started 4 days ago. He states that it's improved 20%. Overall, he states that he's doing well and offers no further complaint.   Past Medical History:  Diagnosis Date  . Anxiety   . Arthritis   . Hypertension     Past Surgical History:  Procedure Laterality Date  . CYST REMOVAL TRUNK     Groin area around 2000  . EMBOLIZATION N/A 04/12/2020   Procedure: EMBOLIZATION, spleen;  Surgeon: Algernon Huxley, MD;  Location: [facility];  Service: Cardiovascular;  Laterality: N/A;  . INCISION AND DRAINAGE ABSCESS N/A 07/26/2019   Procedure: INCISION AND DRAINAGE ABSCESS;  Surgeon: Hollice Espy, MD;  Location: [facility];  Service: Urology;  Laterality: N/A;  . INCISION AND DRAINAGE ABSCESS N/A 07/26/2019   Procedure: INCISION AND DRAINAGE ABSCESS;  Surgeon: Benjamine Sprague, DO;  Location: [facility];  Service: General;  Laterality: N/A;  . LAPAROTOMY N/A 04/13/2020   Procedure: EXPLORATORY LAPAROTOMY;  Surgeon: Jules Husbands, MD;  Location: [facility];  Service: General;   Laterality: N/A;    Family History  Problem Relation Age of Onset  . Diabetes Father   . Hypertension Father   . Congestive Heart Failure Father   . Hypertension Sister   . CVA Sister   . Bipolar disorder Brother   . Schizophrenia Brother     Social History   Socioeconomic History  . Marital status: Single    Spouse name: Not on file  . Number of children: Not on file  . Years of education: Not on file  . Highest education level: Not on file  Occupational History  . Occupation: unemployed  Tobacco Use  . Smoking status: Current Every Day Smoker    Packs/day: 0.50    Years: 30.00    Pack years: 15.00    Types: Cigarettes  . Smokeless tobacco: Never Used  . Tobacco comment: 1/2- 1 pack per day  Vaping Use  . Vaping Use: Never used  Substance and Sexual Activity  . Alcohol use: Not Currently    Comment: 32 oz a day  . Drug use: Yes    Types: Cocaine  . Sexual activity: Not on file  Other Topics Concern  . Not on file  Social History Narrative  . Not on file   Social Determinants of Health   Financial Resource Strain: Unknown  . Difficulty of Paying Living Expenses: Patient refused  Food Insecurity: Not on file  Transportation Needs: No Transportation Needs  . Lack of Transportation (Medical): No  .  Lack of Transportation (Non-Medical): No  Physical Activity: Not on file  Stress: Stress Concern Present  . Feeling of Stress : Very much  Social Connections: Not on file  Intimate Partner Violence: Not on file    Outpatient Medications Prior to Visit  Medication Sig Dispense Refill  . amLODipine (NORVASC) 5 MG tablet Take 1 tablet (5 mg total) by mouth daily. 30 tablet 3  . celecoxib (CELEBREX) 200 MG capsule Take one tab po BID 60 capsule 1  . doxycycline (VIBRAMYCIN) 100 MG capsule Take 1 capsule (100 mg total) by mouth daily. Take with food and drink 30 capsule 3  . folic acid (FOLVITE) 1 MG tablet Take 1 tablet (1 mg total) by mouth daily. 30 tablet 3  .  gabapentin (NEURONTIN) 300 MG capsule Take one cap po QHS 30 capsule 1  . Multiple Vitamin (MULTIVITAMIN WITH MINERALS) TABS tablet Take 1 tablet by mouth daily. 30 tablet 3  . thiamine (VITAMIN B-1) 100 MG tablet TAKE ONE TABLET BY MOUTH EVERY DAY 20 tablet 0  . nicotine (NICODERM CQ - DOSED IN MG/24 HOURS) 21 mg/24hr patch Place 1 patch (21 mg total) onto the skin daily. (Patient not taking: No sig reported) 28 patch 0  . doxycycline (VIBRAMYCIN) 100 MG capsule Take 100 mg by mouth daily. (Patient not taking: Reported on 07/31/2020)     No facility-administered medications prior to visit.    Allergies  Allergen Reactions  . Ibuprofen Other (See Comments)    Stomach upset    ROS Review of Systems  Constitutional: Negative.   Eyes: Negative.   Respiratory: Negative.   Cardiovascular: Negative.   Skin: Negative.        Groin with keloid  Neurological: Negative.   Psychiatric/Behavioral: Negative.       Objective:    Physical Exam HENT:     Head: Normocephalic.  Eyes:     Extraocular Movements: Extraocular movements intact.     Conjunctiva/sclera: Conjunctivae normal.     Pupils: Pupils are equal, round, and reactive to light.  Cardiovascular:     Rate and Rhythm: Normal rate and regular rhythm.     Pulses: Normal pulses.     Heart sounds: Normal heart sounds.  Pulmonary:     Effort: Pulmonary effort is normal.     Breath sounds: Normal breath sounds.  Musculoskeletal:       Feet:  Skin:    Comments: Groin with scattered keloid  Neurological:     General: No focal deficit present.     Mental Status: He is alert and oriented to person, place, and time. Mental status is at baseline.  Psychiatric:        Mood and Affect: Mood normal.        Behavior: Behavior normal.        Thought Content: Thought content normal.        Judgment: Judgment normal.     BP 116/84 (BP Location: Left Arm, Patient Position: Sitting, Cuff Size: Large)   Pulse 73   Temp (!) 97.2 F  (36.2 C)   Resp 16   Wt 167 lb 11.2 oz (76.1 kg)   SpO2 99%   BMI 23.39 kg/m  Wt Readings from Last 3 Encounters:  07/31/20 167 lb 11.2 oz (76.1 kg)  05/08/20 163 lb 3.2 oz (74 kg)  04/29/20 165 lb 12.8 oz (75.2 kg)     Health Maintenance Due  Topic Date Due  . COVID-19 Vaccine (2 - Pfizer 3-dose  series) 12/29/2019    There are no preventive care reminders to display for this patient.  Lab Results  Component Value Date   TSH 2.91 01/01/2015   Lab Results  Component Value Date   WBC 14.7 (H) 04/18/2020   HGB 11.4 (L) 04/18/2020   HCT 33.4 (L) 04/18/2020   MCV 90.0 04/18/2020   PLT 365 04/18/2020   Lab Results  Component Value Date   NA 135 04/18/2020   K 3.9 04/18/2020   CO2 26 04/18/2020   GLUCOSE 97 04/18/2020   BUN 7 04/18/2020   CREATININE 0.67 04/18/2020   BILITOT 0.7 04/15/2020   ALKPHOS 61 04/15/2020   AST 67 (H) 04/15/2020   ALT 21 04/15/2020   PROT 6.0 (L) 04/15/2020   ALBUMIN 2.4 (L) 04/15/2020   CALCIUM 7.8 (L) 04/18/2020   ANIONGAP 5 04/18/2020   Lab Results  Component Value Date   CHOL 141 01/25/2019   Lab Results  Component Value Date   HDL 60 01/25/2019   Lab Results  Component Value Date   LDLCALC 67 01/25/2019   Lab Results  Component Value Date   TRIG 85 04/13/2020   Lab Results  Component Value Date   CHOLHDL 2.4 01/25/2019   Lab Results  Component Value Date   HGBA1C 5.7 (H) 04/13/2020      Assessment & Plan:   1. Primary hypertension - His blood pressure is improving, he will continue on current treatment regimen and DASH diet.  2. Hidradenitis suppurativa - He was advised to follow up with Dermatologist Dr Nehemiah Massed, [facility] states that they are unable to obtain Isotretinion for him though the assistance program.  3. History of gout - His left great toe was swollen, mild erythema and warm to touch, he was educated on medication side effects and advised to notify clinic. - colchicine 0.6 MG  tablet; Take 1 tablet (0.6 mg total) by mouth daily.  Dispense: 3 tablet; Refill: 0     Follow-up: Return in about 13 weeks (around 10/30/2020), or if symptoms worsen or fail to improve.    Shail Urbas Jerold Coombe, NP

## 2020-07-31 NOTE — Patient Instructions (Signed)

## 2020-08-15 ENCOUNTER — Telehealth: Payer: Self-pay | Admitting: Pharmacist

## 2020-08-15 NOTE — Telephone Encounter (Signed)
Patient failed to provide requested 2022 financial documentation. Unable to determine patient's eligibility status. No additional medication assistance will be provided by [facility] without the required proof of income documentation. Patient notified by letter.  [place]

## 2020-08-19 ENCOUNTER — Ambulatory Visit: Payer: Medicaid Other | Admitting: Dermatology

## 2020-08-19 ENCOUNTER — Other Ambulatory Visit: Payer: Self-pay

## 2020-08-19 ENCOUNTER — Other Ambulatory Visit: Payer: Self-pay | Admitting: Gerontology

## 2020-08-19 DIAGNOSIS — L732 Hidradenitis suppurativa: Secondary | ICD-10-CM

## 2020-08-19 DIAGNOSIS — I10 Essential (primary) hypertension: Secondary | ICD-10-CM

## 2020-08-19 MED ORDER — DOXYCYCLINE HYCLATE 100 MG PO TABS: 100.0000 mg | ORAL_TABLET | Freq: Every day | ORAL | 1 refills | Status: DC

## 2020-08-20 ENCOUNTER — Other Ambulatory Visit: Payer: Self-pay | Admitting: Gerontology

## 2020-08-20 ENCOUNTER — Telehealth: Payer: Self-pay | Admitting: Pharmacy Technician

## 2020-08-20 NOTE — Telephone Encounter (Signed)
Received updated proof of income.  Patient eligible to receive medication assistance at [facility] until time for re-certification in 1610, and as long as eligibility requirements continue to be met.  [facility]

## 2020-08-27 NOTE — Telephone Encounter (Signed)
To close telephone encounter 

## 2020-09-09 ENCOUNTER — Other Ambulatory Visit: Payer: Self-pay

## 2020-09-09 MED FILL — Amlodipine Besylate Tab 5 MG (Base Equivalent): ORAL | 90 days supply | Qty: 90 | Fill #0 | Status: AC

## 2020-09-09 MED FILL — Thiamine Mononitrate Tab 100 MG: ORAL | 40 days supply | Qty: 40 | Fill #0 | Status: AC

## 2020-09-09 MED FILL — Folic Acid Tab 1 MG: ORAL | 30 days supply | Qty: 30 | Fill #0 | Status: AC

## 2020-09-11 ENCOUNTER — Other Ambulatory Visit: Payer: Self-pay

## 2020-09-16 ENCOUNTER — Other Ambulatory Visit: Payer: Self-pay

## 2020-09-17 ENCOUNTER — Other Ambulatory Visit: Payer: Self-pay | Admitting: Dermatology

## 2020-09-17 ENCOUNTER — Other Ambulatory Visit: Payer: Self-pay

## 2020-09-17 ENCOUNTER — Other Ambulatory Visit: Payer: Self-pay | Admitting: Gerontology

## 2020-09-17 DIAGNOSIS — Z8739 Personal history of other diseases of the musculoskeletal system and connective tissue: Secondary | ICD-10-CM

## 2020-09-17 DIAGNOSIS — L732 Hidradenitis suppurativa: Secondary | ICD-10-CM

## 2020-09-17 MED FILL — Doxycycline Hyclate Cap 100 MG: ORAL | 30 days supply | Qty: 30 | Fill #0 | Status: CN

## 2020-09-17 MED FILL — Celecoxib Cap 100 MG: ORAL | 30 days supply | Qty: 120 | Fill #0 | Status: CN

## 2020-09-18 ENCOUNTER — Other Ambulatory Visit: Payer: Self-pay

## 2020-09-19 ENCOUNTER — Other Ambulatory Visit: Payer: Self-pay

## 2020-09-20 ENCOUNTER — Other Ambulatory Visit: Payer: Self-pay

## 2020-09-23 ENCOUNTER — Other Ambulatory Visit: Payer: Self-pay

## 2020-09-25 ENCOUNTER — Other Ambulatory Visit: Payer: Self-pay | Admitting: Gerontology

## 2020-09-25 ENCOUNTER — Other Ambulatory Visit: Payer: Self-pay

## 2020-09-25 DIAGNOSIS — L732 Hidradenitis suppurativa: Secondary | ICD-10-CM

## 2020-09-25 MED ORDER — CELECOXIB 100 MG PO CAPS
ORAL_CAPSULE | ORAL | 1 refills | Status: DC
  Filled 2020-09-25: qty 120, 30d supply, fill #0

## 2020-09-25 MED ORDER — GABAPENTIN 300 MG PO CAPS
ORAL_CAPSULE | Freq: Every day | ORAL | 1 refills | Status: DC
  Filled 2020-09-25: qty 30, 30d supply, fill #0

## 2020-09-26 ENCOUNTER — Other Ambulatory Visit: Payer: Self-pay

## 2020-09-27 ENCOUNTER — Other Ambulatory Visit: Payer: Self-pay

## 2020-09-30 ENCOUNTER — Other Ambulatory Visit: Payer: Self-pay

## 2020-09-30 ENCOUNTER — Ambulatory Visit (INDEPENDENT_AMBULATORY_CARE_PROVIDER_SITE_OTHER): Payer: Self-pay | Admitting: Dermatology

## 2020-09-30 DIAGNOSIS — L700 Acne vulgaris: Secondary | ICD-10-CM

## 2020-09-30 DIAGNOSIS — L732 Hidradenitis suppurativa: Secondary | ICD-10-CM

## 2020-09-30 MED ORDER — ISOTRETINOIN 20 MG PO CAPS
20.0000 mg | ORAL_CAPSULE | Freq: Every day | ORAL | 0 refills | Status: AC
  Filled 2020-09-30: qty 30, 30d supply, fill #0

## 2020-09-30 NOTE — Patient Instructions (Addendum)
If you have any questions or concerns for your doctor, please call our main line at [phone] and press option 4 to reach your doctor's medical assistant. If no one answers, please leave a voicemail as directed and we will return your call as soon as possible. Messages left after 4 pm will be answered the following business day.   You may also send [place] a message via [place]. We typically respond to MyChart messages within 1-2 business days.  For prescription refills, please ask your pharmacy to contact our office. Our fax number is [phone].  If you have an urgent issue when the clinic is closed that cannot wait until the next business day, you can page your doctor at the number below.    Please note that while we do our best to be available for urgent issues outside of office hours, we are not available 24/7.   If you have an urgent issue and are unable to reach [place], you may choose to seek medical care at your doctor's office, retail clinic, urgent care center, or emergency room.  If you have a medical emergency, please immediately call 911 or go to the emergency department.  Pager Numbers  - Dr. Nehemiah Massed: [phone]  - Dr. Laurence Ferrari: [phone]  - Dr. Nicole Kindred: [phone]  In the event of inclement weather, please call our main line at [phone] for an update on the status of any delays or closures.  Dermatology Medication Tips: Please keep the boxes that topical medications come in in order to help keep track of the instructions about where and how to use these. Pharmacies typically print the medication instructions only on the boxes and not directly on the medication tubes.   If your medication is too expensive, please contact our office at [phone] option 4 or send [place] a message through [place].   We are unable to tell what your co-pay for medications will be in advance as this is different depending on your insurance coverage. However, we may be able to find a substitute  medication at lower cost or fill out paperwork to get insurance to cover a needed medication.   If a prior authorization is required to get your medication covered by your insurance company, please allow [place] 1-2 business days to complete this process.  Drug prices often vary depending on where the prescription is filled and some pharmacies may offer cheaper prices.  The website www.goodrx.com contains coupons for medications through different pharmacies. The prices here do not account for what the cost may be with help from insurance (it may be cheaper with your insurance), but the website can give you the price if you did not use any insurance.  - You can print the associated coupon and take it with your prescription to the pharmacy.  - You may also stop by our office during regular business hours and pick up a GoodRx coupon card.  - If you need your prescription sent electronically to a different pharmacy, notify our office through [facility] or by phone at [phone] option 4.  Reviewed potential side effects of isotretinoin including xerosis, cheilitis, hepatitis, hyperlipidemia, and severe birth defects if taken by a pregnant woman. Reviewed reports of suicidal ideation in those with a history of depression while taking isotretinoin and reports of diagnosis of inflammatory bowl disease while taking isotretinoin as well as the lack of evidence for a causal relationship between isotretinoin, depression and IBD. Patient advised to reach out with any questions or concerns. Patient advised  not to share pills or donate blood while on treatment or for one month after completing treatment.

## 2020-09-30 NOTE — Progress Notes (Signed)
Follow-Up Visit   Subjective  Caleb Brooks is a 60 y.o. male who presents for the following: Follow-up (Hidradenitis follow up - Taking Gabapentin and Celebrex. He is out of Doxycycline. He was in a significant amount of pain over the Easter weekend. He has gotten insurance since his last appointment so he is hoping to get Isotretinoin now.).  The following portions of the chart were reviewed this encounter and updated as appropriate:   Tobacco  Allergies  Meds  Problems  Med Hx  Surg Hx  Fam Hx     Review of Systems:  No other skin or systemic complaints except as noted in HPI or Assessment and Plan.  Objective  Well appearing patient in no apparent distress; mood and affect are within normal limits.  A focused examination was performed including face, groin. Relevant physical exam findings are noted in the Assessment and Plan.  Objective  Groin area: Fistulas and plaques with drainage of groin, buttocks area   Assessment & Plan  Hidradenitis suppurativa and Acne Groin area Hidradenitis suppurativa -severe and persistent involving mostly the buttocks and groin with drainage.  The drainage is improved with current regimen but it is persistent and not to goal.  He also has associated pain with his hidradenitis. We have discussed oral antibiotics; surgery; isotretinoin; and Humira in the past. buttocks, groin Discussed isotretinoin as the next logical step as a more aggressive option.  He wants to pursue this and we reviewed his labs and will initiate treatment. Labs from 04/15/2020 reviewed and there is a slight elevation of the AST. Will plan to check labs again after 2 months of medication.   Hidradenitis Suppurativa is a chronic; persistent; non-curable, but treatable condition due to abnormal inflamed sweat glands in the body folds (axilla, inframammary, groin, medial thighs), causing recurrent painful cysts and scarring. It can be associated with severe scarring acne and  cysts; abscesses and scarring of scalp. The goal is control and prevention of flares, as it is not curable. Scars are permanent and can be thickened. Treatment may include daily use of topical medication and oral antibiotics.  Oral isotretinoin may also be helpful.  For more severe cases, Humira (a biologic injection) may be prescribed to decrease the inflammatory process and prevent flares.  When indicated, inflamed cysts may also be treated surgically.    D/c Doxycycline Cont Celebrex 200mg  1 po bid - should be prescribed by PCP Cont Gabapentin 300mg  - should be prescribed by PCP  Start Isotretinoin 20mg  1 po qd with fatty meal    IPLEDGE# [id number] Pharmacy Medication management   Reviewed potential side effects of isotretinoin including xerosis, cheilitis, hepatitis, hyperlipidemia, and severe birth defects if taken by a pregnant woman. Reviewed reports of suicidal ideation in those with a history of depression while taking isotretinoin and reports of diagnosis of inflammatory bowl disease while taking isotretinoin as well as the lack of evidence for a causal relationship between isotretinoin, depression and IBD. Patient advised to reach out with any questions or concerns. Patient advised not to share pills or donate blood while on treatment or for one month after completing treatment.   ISOtretinoin (ACCUTANE) 20 MG capsule - Groin area  Other Related Medications gabapentin (NEURONTIN) 300 MG capsule celecoxib (CELEBREX) 100 MG capsule  Acne vulgaris Head - Anterior (Face) Will plan to start Isotretinoin 20 mg 1 qd   Return in about 1 month (around 10/30/2020) for Isotretinoin.  I, [name], CMA, am acting as scribe for  [name], MD .  Documentation: I have reviewed the above documentation for accuracy and completeness, and I agree with the above.  [name], MD

## 2020-10-01 ENCOUNTER — Encounter: Payer: Self-pay | Admitting: Dermatology

## 2020-10-01 ENCOUNTER — Other Ambulatory Visit: Payer: Self-pay

## 2020-10-02 ENCOUNTER — Other Ambulatory Visit: Payer: Self-pay

## 2020-10-02 MED FILL — Doxycycline Hyclate Cap 100 MG: ORAL | 30 days supply | Qty: 30 | Fill #0 | Status: AC

## 2020-10-03 ENCOUNTER — Other Ambulatory Visit: Payer: Self-pay

## 2020-10-03 ENCOUNTER — Other Ambulatory Visit: Payer: Self-pay | Admitting: Gerontology

## 2020-10-03 DIAGNOSIS — Z8739 Personal history of other diseases of the musculoskeletal system and connective tissue: Secondary | ICD-10-CM

## 2020-10-03 MED ORDER — COLCHICINE 0.6 MG PO TABS
ORAL_TABLET | Freq: Every day | ORAL | 0 refills | Status: DC
  Filled 2020-10-03: qty 3, 3d supply, fill #0

## 2020-10-08 ENCOUNTER — Other Ambulatory Visit: Payer: Self-pay

## 2020-10-09 ENCOUNTER — Other Ambulatory Visit: Payer: Self-pay | Admitting: Dermatology

## 2020-10-09 ENCOUNTER — Other Ambulatory Visit: Payer: Self-pay

## 2020-10-09 MED ORDER — DOXYCYCLINE HYCLATE 100 MG PO CAPS
ORAL_CAPSULE | ORAL | 1 refills | Status: DC
  Filled 2020-10-09 – 2020-11-01 (×2): qty 30, 30d supply, fill #0

## 2020-10-17 ENCOUNTER — Other Ambulatory Visit: Payer: Self-pay

## 2020-10-25 ENCOUNTER — Other Ambulatory Visit: Payer: Self-pay

## 2020-10-30 ENCOUNTER — Ambulatory Visit: Payer: Medicaid Other | Admitting: Gerontology

## 2020-10-30 ENCOUNTER — Encounter: Payer: Self-pay | Admitting: Gerontology

## 2020-10-30 ENCOUNTER — Other Ambulatory Visit: Payer: Self-pay

## 2020-10-30 ENCOUNTER — Telehealth: Payer: Self-pay

## 2020-10-30 VITALS — BP 114/77 | HR 59 | Temp 97.7°F | Resp 16 | Ht 71.0 in | Wt 162.5 lb

## 2020-10-30 DIAGNOSIS — L732 Hidradenitis suppurativa: Secondary | ICD-10-CM

## 2020-10-30 MED ORDER — GABAPENTIN 300 MG PO CAPS
300.0000 mg | ORAL_CAPSULE | Freq: Two times a day (BID) | ORAL | 0 refills | Status: DC
  Filled 2020-10-30: qty 60, 30d supply, fill #0

## 2020-10-30 MED ORDER — CELECOXIB 100 MG PO CAPS
ORAL_CAPSULE | ORAL | 0 refills | Status: DC
  Filled 2020-10-30: qty 120, 30d supply, fill #0

## 2020-10-30 NOTE — Progress Notes (Signed)
Established Patient Office Visit  Subjective:  Patient ID: Caleb Brooks, male    DOB: [birth year]  Age: 60 y.o. MRN: [id number]  CC:  Chief Complaint  Patient presents with  . Follow-up  . Hypertension  . Joint Swelling    Left knee has been swelling in the mornings x 2-3 weeks  . Hand Pain    Pain and swelling in the right ring finger x 2-3 weeks  . Ankle Pain    Right ankle pain x 1 month  . Neck Pain    Neck pain and stiffness x 3-4 weeks  . Headache    X 3-4 weeks  . Dental Pain    X 3 weeks - patient has dentist appointment next month  . Elbow Pain    Right elbow pain and stiffness x 2-3 weeks  . 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 He was seen at the Dermatology clinic on 09/30/20. Dr Wilhemena Durie started him on Isotretinoin 20 mg daily, and Doxycycline was discontinued. He has not started taking Isotretinoin because he states that [facility] couldn't procure Isotretinoin for him. He reports intermittent generalized neuropathic pain, joint swelling and out of gabapentin. He states that his mood is dysphoric, worried about getting infection because he has splenectomy done few months ago, but denies SI/HI. He states that his medicaid is active and can't find a Materials engineer. Overall, he's concerned about his condition, and offers no further concerns.  Past Medical History:  Diagnosis Date  . Anxiety   . Arthritis   . Hypertension     Past Surgical History:  Procedure Laterality Date  . CYST REMOVAL TRUNK     Groin area around 2000  . EMBOLIZATION N/A 04/12/2020   Procedure: EMBOLIZATION, spleen;  Surgeon: Algernon Huxley, MD;  Location: [facility];  Service:  Cardiovascular;  Laterality: N/A;  . INCISION AND DRAINAGE ABSCESS N/A 07/26/2019   Procedure: INCISION AND DRAINAGE ABSCESS;  Surgeon: Hollice Espy, MD;  Location: [facility];  Service: Urology;  Laterality: N/A;  . INCISION AND DRAINAGE ABSCESS N/A 07/26/2019   Procedure: INCISION AND DRAINAGE ABSCESS;  Surgeon: Benjamine Sprague, DO;  Location: [facility];  Service: General;  Laterality: N/A;  . LAPAROTOMY N/A 04/13/2020   Procedure: EXPLORATORY LAPAROTOMY;  Surgeon: Jules Husbands, MD;  Location: [facility];  Service: General;  Laterality: N/A;  . SPLENECTOMY, TOTAL      Family History  Problem Relation Age of Onset  . Diabetes Father   . Hypertension Father   . Congestive Heart Failure Father   . Hypertension Sister   . CVA Sister   . Bipolar disorder Brother   . Schizophrenia Brother   . Arthritis Mother   . Cancer Brother     Social History   Socioeconomic History  . Marital status: Single    Spouse name: Not on file  . Number of children: Not on file  . Years of education: Not on file  . Highest education level: Not on file  Occupational History  . Occupation: unemployed  Tobacco Use  . Smoking status: Current Every Day Smoker    Packs/day: 0.50    Years: 30.00    Pack  years: 15.00    Types: Cigarettes  . Smokeless tobacco: Never Used  . Tobacco comment: 1/2- 1 pack per day  Vaping Use  . Vaping Use: Never used  Substance and Sexual Activity  . Alcohol use: Yes    Comment: 32 oz a few days per week  . Drug use: Yes    Types: Cocaine    Comment: patient doesn't state how many times he uses in a week or when he last used  . Sexual activity: Not on file  Other Topics Concern  . Not on file  Social History Narrative  . Not on file   Social Determinants of Health   Financial Resource Strain: Unknown  . Difficulty of Paying Living Expenses: Patient refused  Food Insecurity: No Food Insecurity  . Worried About Charity fundraiser in the Last Year: Never true  . Ran  Out of Food in the Last Year: Never true  Transportation Needs: No Transportation Needs  . Lack of Transportation (Medical): No  . Lack of Transportation (Non-Medical): No  Physical Activity: Not on file  Stress: Stress Concern Present  . Feeling of Stress : Very much  Social Connections: Not on file  Intimate Partner Violence: Not on file    Outpatient Medications Prior to Visit  Medication Sig Dispense Refill  . amLODipine (NORVASC) 5 MG tablet TAKE ONE TABLET BY MOUTH EVERY DAY 90 tablet 1  . celecoxib (CELEBREX) 100 MG capsule Take 2 capsules (200mg  total) by mouth twice a day. (Patient taking differently: Take 2 capsules (200mg  total) by mouth twice a day.) 983 capsule 1  . folic acid (FOLVITE) 1 MG tablet TAKE ONE TABLET BY MOUTH EVERY DAY 30 tablet 1  . gabapentin (NEURONTIN) 300 MG capsule TAKE ONE CAPSULE BY MOUTH AT BEDTIME 30 capsule 1  . Multiple Vitamin (MULTIVITAMIN WITH MINERALS) TABS tablet Take 1 tablet by mouth daily. 30 tablet 3  . thiamine (VITAMIN B-1) 100 MG tablet TAKE ONE TABLET BY MOUTH EVERY DAY 40 tablet 1  . colchicine 0.6 MG tablet TAKE ONE TABLET BY MOUTH EVERY DAY (Patient not taking: Reported on 10/30/2020) 3 tablet 0  . doxycycline (VIBRA-TABS) 100 MG tablet TAKE ONE CAPSULE BY MOUTH EVERY DAY WITH FOOD AND PLENTY OF FLUID. (Patient not taking: Reported on 10/30/2020) 30 tablet 1  . ISOtretinoin (ACCUTANE) 20 MG capsule Take 1 capsule (20 mg total) by mouth daily. (Patient not taking: Reported on 10/30/2020) 30 capsule 0  . nicotine (NICODERM CQ - DOSED IN MG/24 HOURS) 21 mg/24hr patch Place 1 patch (21 mg total) onto the skin daily. (Patient not taking: No sig reported) 28 patch 0  . ciprofloxacin (CIPRO) 500 MG tablet TAKE ONE TABLET BY MOUTH 2 TIMES A DAY FOR 3 DAYS 6 tablet 0   No facility-administered medications prior to visit.    Allergies  Allergen Reactions  . Ibuprofen Other (See Comments)    Stomach upset    ROS Review of Systems   Constitutional: Negative.   Respiratory: Negative.   Cardiovascular: Negative.   Musculoskeletal: Positive for arthralgias (mulitple joint pain).  Neurological: Negative.   Psychiatric/Behavioral: The patient is nervous/anxious.       Objective:    Physical Exam HENT:     Head: Normocephalic and atraumatic.  Cardiovascular:     Rate and Rhythm: Normal rate and regular rhythm.     Pulses: Normal pulses.     Heart sounds: Normal heart sounds.  Pulmonary:     Effort: Pulmonary  effort is normal.     Breath sounds: Normal breath sounds.  Musculoskeletal:        General: Normal range of motion.  Skin:    General: Skin is warm.  Neurological:     General: No focal deficit present.     Mental Status: He is alert and oriented to person, place, and time. Mental status is at baseline.  Psychiatric:        Mood and Affect: Mood normal.        Behavior: Behavior normal.        Thought Content: Thought content normal.        Judgment: Judgment normal.     BP 114/77 (BP Location: Right Arm, Patient Position: Sitting, Cuff Size: Large)   Pulse (!) 59   Temp 97.7 F (36.5 C) (Oral)   Resp 16   Ht 5\' 11"  (1.803 m)   Wt 162 lb 8 oz (73.7 kg)   SpO2 98%   BMI 22.66 kg/m  Wt Readings from Last 3 Encounters:  10/30/20 162 lb 8 oz (73.7 kg)  07/31/20 167 lb 11.2 oz (76.1 kg)  05/08/20 163 lb 3.2 oz (74 kg)     Health Maintenance Due  Topic Date Due  . COVID-19 Vaccine (2 - Pfizer 3-dose series) 12/29/2019    There are no preventive care reminders to display for this patient.  Lab Results  Component Value Date   TSH 2.91 01/01/2015   Lab Results  Component Value Date   WBC 14.7 (H) 04/18/2020   HGB 11.4 (L) 04/18/2020   HCT 33.4 (L) 04/18/2020   MCV 90.0 04/18/2020   PLT 365 04/18/2020   Lab Results  Component Value Date   NA 135 04/18/2020   K 3.9 04/18/2020   CO2 26 04/18/2020   GLUCOSE 97 04/18/2020   BUN 7 04/18/2020   CREATININE 0.67 04/18/2020   BILITOT  0.7 04/15/2020   ALKPHOS 61 04/15/2020   AST 67 (H) 04/15/2020   ALT 21 04/15/2020   PROT 6.0 (L) 04/15/2020   ALBUMIN 2.4 (L) 04/15/2020   CALCIUM 7.8 (L) 04/18/2020   ANIONGAP 5 04/18/2020   Lab Results  Component Value Date   CHOL 141 01/25/2019   Lab Results  Component Value Date   HDL 60 01/25/2019   Lab Results  Component Value Date   LDLCALC 67 01/25/2019   Lab Results  Component Value Date   TRIG 85 04/13/2020   Lab Results  Component Value Date   CHOLHDL 2.4 01/25/2019   Lab Results  Component Value Date   HGBA1C 5.7 (H) 04/13/2020      Assessment & Plan:   1. Hidradenitis suppurativa - He was worried about getting his medications refilled. Since he has no Medicaid PCP, [facility] will provide one month supply of his gabapentin and Celebrex. We reached out to Dr Alveria Apley clinic for Doxycycline refill pending a Medicaid provider sending another referral for him to be seen. He has a new patient appointment on 11/19/20 with [facility]. He was happy prior to leaving after his appointment. He was advised to go to the ED for any signs of infection such as but not limited to fever, chills and purulent drainage. - gabapentin (NEURONTIN) 300 MG capsule; Take 1 capsule (300 mg total) by mouth 2 (two) times daily.  Dispense: 60 capsule; Refill: 0 - celecoxib (CELEBREX) 100 MG capsule; Take 2 capsules (200mg  total) by mouth twice a day.  Dispense: 120 capsule; Refill: 0  Follow-up:  He has no follow up appointment since his Medicaid is active. [place] was glad to have provided him with excellent compassionate care and he was very appreciative. [place] wishes him well with his care.   Tayt Moyers Jerold Coombe, NP

## 2020-10-30 NOTE — Telephone Encounter (Signed)
Patient showed up at [facility] today for follow up. Provider there has called our office to let us know patient has decided not to do Acuutane/isotretinoin therapy. Patient now has Medicaid insurance and will need referral from PCP as he can no longer be treated at [facility]. Patient is asking for a RF of Doxycycline. [facility] provider will no longer refill. Are you okay sending a RF of medication in to have patient covered until he can get referral and back in our office?  [facility] [place]

## 2020-10-30 NOTE — Telephone Encounter (Signed)
May have 3 rf Doxycycline

## 2020-10-30 NOTE — Progress Notes (Signed)
Scheduled patient an appointment at [facility] to establish care for November 19, 2020 at 9:40am.

## 2020-11-01 ENCOUNTER — Other Ambulatory Visit: Payer: Self-pay

## 2020-11-05 MED ORDER — DOXYCYCLINE HYCLATE 100 MG PO CAPS
ORAL_CAPSULE | ORAL | 1 refills | Status: DC
Start: 2020-11-05 — End: 2020-12-23

## 2020-11-05 NOTE — Telephone Encounter (Signed)
Doxycycline RF sent in. Called patient but no answer and no voicemail.

## 2020-11-19 ENCOUNTER — Other Ambulatory Visit: Payer: Self-pay

## 2020-11-19 ENCOUNTER — Telehealth: Payer: Self-pay | Admitting: *Deleted

## 2020-11-19 ENCOUNTER — Encounter: Payer: Self-pay | Admitting: Nurse Practitioner

## 2020-11-19 ENCOUNTER — Ambulatory Visit: Payer: Medicaid Other | Admitting: Nurse Practitioner

## 2020-11-19 VITALS — BP 116/78 | HR 65 | Temp 98.4°F | Ht 70.55 in | Wt 157.5 lb

## 2020-11-19 DIAGNOSIS — I10 Essential (primary) hypertension: Secondary | ICD-10-CM

## 2020-11-19 DIAGNOSIS — Z8739 Personal history of other diseases of the musculoskeletal system and connective tissue: Secondary | ICD-10-CM

## 2020-11-19 DIAGNOSIS — Z5982 Transportation insecurity: Secondary | ICD-10-CM

## 2020-11-19 DIAGNOSIS — F1010 Alcohol abuse, uncomplicated: Secondary | ICD-10-CM

## 2020-11-19 DIAGNOSIS — L732 Hidradenitis suppurativa: Secondary | ICD-10-CM

## 2020-11-19 DIAGNOSIS — Z720 Tobacco use: Secondary | ICD-10-CM

## 2020-11-19 DIAGNOSIS — Z5989 Other problems related to housing and economic circumstances: Secondary | ICD-10-CM

## 2020-11-19 DIAGNOSIS — B351 Tinea unguium: Secondary | ICD-10-CM

## 2020-11-19 DIAGNOSIS — M1990 Unspecified osteoarthritis, unspecified site: Secondary | ICD-10-CM

## 2020-11-19 MED ORDER — THIAMINE MONONITRATE 100 MG PO TABS: ORAL_TABLET | Freq: Every day | ORAL | 1 refills | Status: DC

## 2020-11-19 MED ORDER — FOLIC ACID 1 MG PO TABS: ORAL_TABLET | Freq: Every day | ORAL | 1 refills | Status: DC

## 2020-11-19 MED ORDER — NICOTINE 21 MG/24HR TD PT24: 21.0000 mg | MEDICATED_PATCH | Freq: Every day | TRANSDERMAL | 0 refills | Status: DC

## 2020-11-19 MED ORDER — ADULT MULTIVITAMIN W/MINERALS CH: 1.0000 | ORAL_TABLET | Freq: Every day | ORAL | 3 refills | Status: DC

## 2020-11-19 MED ORDER — GABAPENTIN 300 MG PO CAPS: 300.0000 mg | ORAL_CAPSULE | Freq: Three times a day (TID) | ORAL | 0 refills | Status: DC

## 2020-11-19 MED ORDER — CELECOXIB 100 MG PO CAPS: ORAL_CAPSULE | ORAL | 0 refills | Status: DC

## 2020-11-19 NOTE — Assessment & Plan Note (Signed)
Current everyday drinker. Drinks about 32 ounces of beer daily. Not ready to quit.

## 2020-11-19 NOTE — Assessment & Plan Note (Signed)
No current flare.  Discussed this is likely caused by alcohol use. Can consider allopurinol in the future.

## 2020-11-19 NOTE — Assessment & Plan Note (Signed)
Chronic.  Well controlled with Amlodipine 5mg  daily.  Follow up in 1 month.  Will draw labs at next visit.

## 2020-11-19 NOTE — Telephone Encounter (Signed)
I sent his multivitamin, thiamine and folic acid to the pharmacy for him.  I do not fill maintenance antibiotics.  He will have to see his dermatologist for that.  Recommend seeing dermatology for fungal cream.

## 2020-11-19 NOTE — Assessment & Plan Note (Signed)
Ongoing.  Followed by Dr. Nehemiah Massed. On Doxycycline daily for prevention. Updated referral placed for Derm.

## 2020-11-19 NOTE — Progress Notes (Signed)
BP 116/78   Pulse 65   Temp 98.4 F (36.9 C)   Ht 5' 10.55" (1.792 m)   Wt 157 lb 8 oz (71.4 kg)   SpO2 98%   BMI 22.25 kg/m    Subjective:    Patient ID: Caleb Brooks, male    DOB: 08/26/1960, 60 y.o.   MRN: [id number]  HPI: Caleb Brooks is a 60 y.o. male  Chief Complaint  Patient presents with   Establish Care   Medication Refill    Patches   Arthritis   Referral    [facility], recurrent boils   Neck Pain    With cold weather    Patient presents to clinic to establish care with new PCP.  Patient reports a history of HTN, tobacco use, alcohol use, and hydradenitis.  Patient denies a history of: Elevated Cholesterol, Thyroid problems, Depression, Anxiety, Neurological problems, and Abdominal problems.   SMOKING CESSATION Smoking Status: current smoker Smoking Amount: 1/2 ppd Smoking Onset: 30 years Smoking Quit Date: current smoker Smoking triggers: Type of tobacco use: cigarettes Children in the house: no Other household members who smoke: no Treatments attempted: patches  Pneumovax:  Does not know if he has had a CT lung.  ALCOHOL USE Patient states he drinks about 32 ounces of beer per day. Is not ready to quit smoking.   HYPERTENSION Patient does not check blood pressures at home. Takes amlodipine daily.  ARTHRITIS PAIN Patient states he takes the Celebrex for arthritis pain. Left knee, left thumb, both hands, right elbow.  Patient also struggles with Gout. Does not currently have a flare.    Denies HA, CP, SOB, dizziness, palpitations, visual changes, and lower extremity swelling.  Relevant past medical, surgical, family and social history reviewed and updated as indicated. Interim medical history since our last visit reviewed. Allergies and medications reviewed and updated.  Review of Systems  Eyes:  Negative for visual disturbance.  Respiratory:  Negative for shortness of breath.   Cardiovascular:  Negative for chest pain and leg  swelling.  Musculoskeletal:  Positive for arthralgias.  Neurological:  Negative for light-headedness and headaches.   Per HPI unless specifically indicated above     Objective:    BP 116/78   Pulse 65   Temp 98.4 F (36.9 C)   Ht 5' 10.55" (1.792 m)   Wt 157 lb 8 oz (71.4 kg)   SpO2 98%   BMI 22.25 kg/m   Wt Readings from Last 3 Encounters:  11/19/20 157 lb 8 oz (71.4 kg)  10/30/20 162 lb 8 oz (73.7 kg)  07/31/20 167 lb 11.2 oz (76.1 kg)    Physical Exam Vitals and nursing note reviewed.  Constitutional:      General: He is not in acute distress.    Appearance: Normal appearance. He is not ill-appearing, toxic-appearing or diaphoretic.  HENT:     Head: Normocephalic.     Right Ear: External ear normal.     Left Ear: External ear normal.     Nose: Nose normal. No congestion or rhinorrhea.     Mouth/Throat:     Mouth: Mucous membranes are moist.  Eyes:     General:        Right eye: No discharge.        Left eye: No discharge.     Extraocular Movements: Extraocular movements intact.     Conjunctiva/sclera: Conjunctivae normal.     Pupils: Pupils are equal, round, and reactive to light.  Cardiovascular:     Rate and Rhythm: Normal rate and regular rhythm.     Heart sounds: No murmur heard. Pulmonary:     Effort: Pulmonary effort is normal. No respiratory distress.     Breath sounds: Normal breath sounds. No wheezing, rhonchi or rales.  Abdominal:     General: Abdomen is flat. Bowel sounds are normal.  Musculoskeletal:     Cervical back: Normal range of motion and neck supple.  Skin:    General: Skin is warm and dry.     Capillary Refill: Capillary refill takes less than 2 seconds.  Neurological:     General: No focal deficit present.     Mental Status: He is alert and oriented to person, place, and time.  Psychiatric:        Mood and Affect: Mood normal.        Behavior: Behavior normal.        Thought Content: Thought content normal.        Judgment:  Judgment normal.        Assessment & Plan:   Problem List Items Addressed This Visit       Cardiovascular and Mediastinum   Hypertension - Primary    Chronic.  Well controlled with Amlodipine 5mg  daily.  Follow up in 1 month.  Will draw labs at next visit.        Relevant Orders   AMB Referral to [facility]     Musculoskeletal and Integument   Arthritis    Chronic.  Not well controlled.  Discussed proper use of Celebrex during visit today. Discussed concerns for patient's kidney function with continued use of Celebrex daily. Will increase Gabapentin to 300mg  TID to help with pain. Referral placed for patient to see Orthopedics for additional evaluation and treatment of Arthritis.  Can consider pain management referral in the future.        Relevant Medications   celecoxib (CELEBREX) 100 MG capsule   Other Relevant Orders   Ambulatory referral to Orthopedics   Hidradenitis suppurativa    Ongoing.  Followed by Dr. Nehemiah Massed. On Doxycycline daily for prevention. Updated referral placed for Derm.        Relevant Medications   gabapentin (NEURONTIN) 300 MG capsule   celecoxib (CELEBREX) 100 MG capsule   Other Relevant Orders   Ambulatory referral to Dermatology     Other   Tobacco abuse    Chronic.  Refilled Nicoderm patches. Recommend picking a day next week to stop smoking. Uses patches as nicotine replacement.  Follow up in 1 month for reevaluation.        Relevant Orders   AMB Referral to [facility]ordin[facility]   ETOH abuse    Current everyday drinker. Drinks about 32 ounces of beer daily. Not ready to quit.       History of gout    No current flare.  Discussed this is likely caused by alcohol use. Can consider allopurinol in the future.        Other Visit Diagnoses     Inability to acquire transportation       Does not have transportation. Referral placed for patient to CCM team to help with rides and applying for disability.     Relevant Orders   AMB Referral to [facility]ordin[facility]        Follow up plan: Return in about 1 month (around 12/19/2020) for blood work and arthtiris pain.   A total of 40 minutes were  spent on this encounter today.  When total time is documented, this includes both the face-to-face and non-face-to-face time personally spent before, during and after the visit on the date of the encounter.

## 2020-11-19 NOTE — Telephone Encounter (Signed)
Patient notified.  Please place referral for podiatry

## 2020-11-19 NOTE — Assessment & Plan Note (Signed)
Chronic.  Not well controlled.  Discussed proper use of Celebrex during visit today. Discussed concerns for patient's kidney function with continued use of Celebrex daily. Will increase Gabapentin to 300mg  TID to help with pain. Referral placed for patient to see Orthopedics for additional evaluation and treatment of Arthritis.  Can consider pain management referral in the future.

## 2020-11-19 NOTE — Assessment & Plan Note (Signed)
Chronic.  Refilled Nicoderm patches. Recommend picking a day next week to stop smoking. Uses patches as nicotine replacement.  Follow up in 1 month for reevaluation.

## 2020-11-19 NOTE — Telephone Encounter (Signed)
Caleb Brooks Pt. Came back and wants aall his meds refilled and some fungal cream for his feet. He specified folic acid and vitamins. Please advise Thx

## 2020-11-25 ENCOUNTER — Telehealth: Payer: Self-pay

## 2020-11-25 NOTE — Telephone Encounter (Signed)
Copied from [place] [id number]. Topic: Referral - Status >> Nov 25, 2020  8:09 AM Alanda Slim E wrote: Reason for CRM: [facility] called and stated they received referral but does not treat nail fungus /they recommend a local podiatry / she stated theyre providers are surgeons

## 2020-12-02 ENCOUNTER — Ambulatory Visit: Payer: Self-pay | Admitting: Podiatry

## 2020-12-04 NOTE — Telephone Encounter (Signed)
   Telephone encounter was:  Successful.  12/04/2020 Name: Caleb Brooks MRN: [id number] DOB: [birth year]  Caleb Brooks is a 60 y.o. year old male who is a primary care patient of Jon Billings, NP . The community resource team was consulted for assistance with  applying for disability.  Care guide performed the following interventions: Patient provided with information about care guide support team and interviewed to confirm resource needs Discussed resources to assist with disability. Sent pt email with information on the [place]. He will call get assistance .  Follow Up Plan:  No further follow up planned at this time. The patient has been provided with needed resources.  April Green Care Guide, Embedded Care Coordination [place], Care Management Phone: [phone] Email: [email or web address]

## 2020-12-19 ENCOUNTER — Telehealth: Payer: Self-pay | Admitting: Pharmacy Technician

## 2020-12-19 NOTE — Telephone Encounter (Signed)
Patient has Medicaid with prescription drug coverage.  No longer meets the eligibility criteria for [facility].  Patient notified by letter.  [place], [place]  [address]   December 19, 2020    Kaitlyn Skowron [address] [place], [place]  [address]  Dear Marc Morgans:  This is to inform you that you are no longer eligible to receive medication assistance at [facility].  The reason(s) are:    _____Your total gross monthly household income exceeds 250% of the Federal Poverty Level.   _____Tangible assets (savings, checking, stocks/bonds, pension, retirement, etc.) exceeds our limit  __X__You are eligible to receive benefits from [facility], [facility] or HIV Medication            Assistance program _____You are eligible to receive benefits from a Medicare Part "D" plan _____You have prescription insurance  _____You are not an [facility] resident _____Failure to provide all requested documentation (proof of income information for 2022., and/or Patient Intake Application, [place], Contract, etc).    We regret that we are unable to help you at this time.  If your prescription coverage is terminated, please contact [facility], so that we may reassess your eligibility for our program.  If you have questions, we may be contacted at [phone].  Thank you,  [facility]

## 2020-12-23 ENCOUNTER — Other Ambulatory Visit: Payer: Self-pay

## 2020-12-23 ENCOUNTER — Encounter: Payer: Self-pay | Admitting: Nurse Practitioner

## 2020-12-23 ENCOUNTER — Ambulatory Visit (INDEPENDENT_AMBULATORY_CARE_PROVIDER_SITE_OTHER): Payer: Self-pay | Admitting: Nurse Practitioner

## 2020-12-23 VITALS — BP 108/73 | HR 67 | Temp 98.4°F | Wt 158.2 lb

## 2020-12-23 DIAGNOSIS — M1990 Unspecified osteoarthritis, unspecified site: Secondary | ICD-10-CM

## 2020-12-23 DIAGNOSIS — I10 Essential (primary) hypertension: Secondary | ICD-10-CM

## 2020-12-23 DIAGNOSIS — L732 Hidradenitis suppurativa: Secondary | ICD-10-CM

## 2020-12-23 MED ORDER — CELECOXIB 100 MG PO CAPS: ORAL_CAPSULE | ORAL | 0 refills | Status: DC

## 2020-12-23 MED ORDER — AMLODIPINE BESYLATE 5 MG PO TABS
ORAL_TABLET | Freq: Every day | ORAL | 1 refills | Status: DC
Start: 2020-12-23 — End: 2021-05-27

## 2020-12-23 MED ORDER — DOXYCYCLINE HYCLATE 100 MG PO CAPS: ORAL_CAPSULE | ORAL | 2 refills | Status: DC

## 2020-12-23 MED ORDER — THIAMINE MONONITRATE 100 MG PO TABS: ORAL_TABLET | Freq: Every day | ORAL | 1 refills | Status: DC

## 2020-12-23 NOTE — Assessment & Plan Note (Signed)
Recommend making appointment to see Orthopedics for further evaluation and management of Arthritis.

## 2020-12-23 NOTE — Progress Notes (Signed)
BP 108/73   Pulse 67   Temp 98.4 F (36.9 C)   Wt 158 lb 4 oz (71.8 kg)   SpO2 98%   BMI 22.35 kg/m    Subjective:    Patient ID: Caleb Brooks, male    DOB: 1961/03/13, 60 y.o.   MRN: [id number]  HPI: Caleb Brooks is a 60 y.o. male  Chief Complaint  Patient presents with   Medication Refill    Doxycycline and bp meds   HYPERTENSION Hypertension status: controlled  Satisfied with current treatment? yes Duration of hypertension: years BP monitoring frequency:  not checking BP range:  BP medication side effects:  no Medication compliance: excellent compliance Previous BP meds:amlodipine Aspirin: no Recurrent headaches: no Visual changes: no Palpitations: no Dyspnea: no Chest pain: no Lower extremity edema: no Dizzy/lightheaded: no  ARTHRITIS Patient here today to follow up on Arthritis. He has been taking celebrex and Gabapentin 36m TID.  He states that he hasn't had any improvement in his pain. He is up all night.  Continues to deal with boils on his buttock.    Relevant past medical, surgical, family and social history reviewed and updated as indicated. Interim medical history since our last visit reviewed. Allergies and medications reviewed and updated.  Review of Systems  Eyes:  Negative for visual disturbance.  Respiratory:  Negative for shortness of breath.   Cardiovascular:  Negative for chest pain and leg swelling.  Musculoskeletal:  Positive for arthralgias.  Neurological:  Negative for light-headedness and headaches.   Per HPI unless specifically indicated above     Objective:    BP 108/73   Pulse 67   Temp 98.4 F (36.9 C)   Wt 158 lb 4 oz (71.8 kg)   SpO2 98%   BMI 22.35 kg/m   Wt Readings from Last 3 Encounters:  12/23/20 158 lb 4 oz (71.8 kg)  11/19/20 157 lb 8 oz (71.4 kg)  10/30/20 162 lb 8 oz (73.7 kg)    Physical Exam Vitals and nursing note reviewed.  Constitutional:      General: He is not in acute distress.     Appearance: Normal appearance. He is not ill-appearing, toxic-appearing or diaphoretic.  HENT:     Head: Normocephalic.     Right Ear: External ear normal.     Left Ear: External ear normal.     Nose: Nose normal. No congestion or rhinorrhea.     Mouth/Throat:     Mouth: Mucous membranes are moist.  Eyes:     General:        Right eye: No discharge.        Left eye: No discharge.     Extraocular Movements: Extraocular movements intact.     Conjunctiva/sclera: Conjunctivae normal.     Pupils: Pupils are equal, round, and reactive to light.  Cardiovascular:     Rate and Rhythm: Normal rate and regular rhythm.     Heart sounds: No murmur heard. Pulmonary:     Effort: Pulmonary effort is normal. No respiratory distress.     Breath sounds: Normal breath sounds. No wheezing, rhonchi or rales.  Abdominal:     General: Abdomen is flat. Bowel sounds are normal.  Musculoskeletal:     Cervical back: Normal range of motion and neck supple.  Skin:    General: Skin is warm and dry.     Capillary Refill: Capillary refill takes less than 2 seconds.  Neurological:     General: No focal  deficit present.     Mental Status: He is alert and oriented to person, place, and time.  Psychiatric:        Mood and Affect: Mood normal.        Behavior: Behavior normal.        Thought Content: Thought content normal.        Judgment: Judgment normal.     Assessment & Plan:   Problem List Items Addressed This Visit       Cardiovascular and Mediastinum   Hypertension - Primary    Chronic.  Controlled.  Continue with current medication regimen.  Labs ordered today.  Return to clinic in 2 months for reevaluation.  Refills sent. Call sooner if concerns arise.         Relevant Medications   amLODipine (NORVASC) 5 MG tablet   thiamine (VITAMIN B-1) 100 MG tablet   Other Relevant Orders   Comp Met (CMET)   Lipid Profile   CBC w/Diff     Musculoskeletal and Integument   Arthritis    Recommend  making appointment to see Orthopedics for further evaluation and management of Arthritis.        Relevant Medications   celecoxib (CELEBREX) 100 MG capsule   Other Relevant Orders   CBC w/Diff   Hidradenitis suppurativa    Chronic.  Patient has not followed up with Dr. Nehemiah Massed, dermatologist, since last visit. He has been out of doxycycline for 1 week.  States boils are hurting him.  Called to make patient an appointment with Dermatology ASAP while he was in the office. The soonest appointment is September 12. Refilled Doxycyline for patient until he is able to get into Dermatology. He never started the Isotrenitonin after last visit with Dr. Nehemiah Massed. Discussed that this will be patient's only refill of Doxycline from this office and it will need to come from Dr. Nehemiah Massed.        Relevant Medications   celecoxib (CELEBREX) 100 MG capsule   Other Relevant Orders   CBC w/Diff   Other Visit Diagnoses     Essential hypertension       Relevant Medications   amLODipine (NORVASC) 5 MG tablet   thiamine (VITAMIN B-1) 100 MG tablet   Other Relevant Orders   CBC w/Diff        Follow up plan: Return in about 2 months (around 02/23/2021) for Physical and Fasting labs.   A total of 30 minutes were spent on this encounter today.  When total time is documented, this includes both the face-to-face and non-face-to-face time personally spent before, during and after the visit on the date of the encounter coordinating care for patient to see Dermatology and gathering documents for patient to apply for diasbility.

## 2020-12-23 NOTE — Assessment & Plan Note (Signed)
Chronic.  Patient has not followed up with Dr. Nehemiah Massed, dermatologist, since last visit. He has been out of doxycycline for 1 week.  States boils are hurting him.  Called to make patient an appointment with Dermatology ASAP while he was in the office. The soonest appointment is September 12. Refilled Doxycyline for patient until he is able to get into Dermatology. He never started the Isotrenitonin after last visit with Dr. Nehemiah Massed. Discussed that this will be patient's only refill of Doxycline from this office and it will need to come from Dr. Nehemiah Massed.

## 2020-12-23 NOTE — Assessment & Plan Note (Signed)
Chronic.  Controlled.  Continue with current medication regimen.  Labs ordered today.  Return to clinic in 2 months for reevaluation.  Refills sent. Call sooner if concerns arise.

## 2020-12-24 LAB — CBC WITH DIFFERENTIAL/PLATELET
Basophils Absolute: 0 10*3/uL (ref 0.0–0.2)
Basos: 1 %
EOS (ABSOLUTE): 0.4 10*3/uL (ref 0.0–0.4)
Eos: 5 %
Hematocrit: 42 % (ref 37.5–51.0)
Hemoglobin: 13.7 g/dL (ref 13.0–17.7)
Immature Grans (Abs): 0 10*3/uL (ref 0.0–0.1)
Immature Granulocytes: 0 %
Lymphocytes Absolute: 2.2 10*3/uL (ref 0.7–3.1)
Lymphs: 26 %
MCH: 28.9 pg (ref 26.6–33.0)
MCHC: 32.6 g/dL (ref 31.5–35.7)
MCV: 89 fL (ref 79–97)
Monocytes Absolute: 1.1 10*3/uL — ABNORMAL HIGH (ref 0.1–0.9)
Monocytes: 13 %
Neutrophils Absolute: 4.8 10*3/uL (ref 1.4–7.0)
Neutrophils: 55 %
Platelets: 450 10*3/uL (ref 150–450)
RBC: 4.74 x10E6/uL (ref 4.14–5.80)
RDW: 14.1 % (ref 11.6–15.4)
WBC: 8.5 10*3/uL (ref 3.4–10.8)

## 2020-12-24 LAB — COMPREHENSIVE METABOLIC PANEL
ALT: 7 IU/L (ref 0–44)
AST: 14 IU/L (ref 0–40)
Albumin/Globulin Ratio: 1 — ABNORMAL LOW (ref 1.2–2.2)
Albumin: 3.9 g/dL (ref 3.8–4.9)
Alkaline Phosphatase: 168 IU/L — ABNORMAL HIGH (ref 44–121)
BUN/Creatinine Ratio: 11 (ref 9–20)
BUN: 11 mg/dL (ref 6–24)
Bilirubin Total: 0.4 mg/dL (ref 0.0–1.2)
CO2: 24 mmol/L (ref 20–29)
Calcium: 9.5 mg/dL (ref 8.7–10.2)
Chloride: 102 mmol/L (ref 96–106)
Creatinine, Ser: 1.04 mg/dL (ref 0.76–1.27)
Globulin, Total: 4 g/dL (ref 1.5–4.5)
Glucose: 78 mg/dL (ref 65–99)
Potassium: 4.3 mmol/L (ref 3.5–5.2)
Sodium: 140 mmol/L (ref 134–144)
Total Protein: 7.9 g/dL (ref 6.0–8.5)
eGFR: 83 mL/min/{1.73_m2} (ref >59)

## 2020-12-24 LAB — LIPID PANEL
Chol/HDL Ratio: 2.1 ratio (ref 0.0–5.0)
Cholesterol, Total: 128 mg/dL (ref 100–199)
HDL: 62 mg/dL (ref >39)
LDL Chol Calc (NIH): 53 mg/dL (ref 0–99)
Triglycerides: 62 mg/dL (ref 0–149)
VLDL Cholesterol Cal: 13 mg/dL (ref 5–40)

## 2020-12-24 NOTE — Progress Notes (Signed)
Hi Caleb Brooks. Your lab work looks good.  Your White blood cell count is within normal limits which is good new since you have the recurring boils.  We will continue to monitor your blood work in the future.

## 2020-12-25 ENCOUNTER — Other Ambulatory Visit: Payer: Self-pay

## 2021-01-15 ENCOUNTER — Other Ambulatory Visit: Payer: Self-pay | Admitting: Nurse Practitioner

## 2021-01-15 DIAGNOSIS — I10 Essential (primary) hypertension: Secondary | ICD-10-CM

## 2021-01-15 NOTE — Telephone Encounter (Signed)
Approved per protocol.  Requested Prescriptions  Pending Prescriptions Disp Refills  . folic acid (FOLVITE) 1 MG tablet [Pharmacy Med Name: FOLIC ACID 1 MG TABLET] 30 tablet 1    Sig: TAKE 1 TABLET BY MOUTH EVERY DAY     Endocrinology:  Vitamins Passed - 01/15/2021  4:29 PM      Passed - Valid encounter within last 12 months    Recent Outpatient Visits          3 weeks ago Primary hypertension   [facility] Jon Billings, NP   1 month ago Primary hypertension   [facility] Jon Billings, NP      Future Appointments            In 1 month Ralene Bathe, MD [place]   In 1 month Jon Billings, NP [facility], [place]

## 2021-01-25 ENCOUNTER — Other Ambulatory Visit: Payer: Self-pay | Admitting: Nurse Practitioner

## 2021-01-25 DIAGNOSIS — L732 Hidradenitis suppurativa: Secondary | ICD-10-CM

## 2021-01-25 NOTE — Telephone Encounter (Signed)
Requested Prescriptions  Pending Prescriptions Disp Refills  . celecoxib (CELEBREX) 100 MG capsule [Pharmacy Med Name: CELECOXIB 100 MG CAPSULE] 120 capsule 0    Sig: TAKE 2 CAPSULES ('200MG'$  TOTAL) BY MOUTH TWICE A DAY.     Analgesics:  COX2 Inhibitors Passed - 01/25/2021  9:14 AM      Passed - HGB in normal range and within 360 days    Hemoglobin  Date Value Ref Range Status  12/23/2020 13.7 13.0 - 17.7 g/dL Final         Passed - Cr in normal range and within 360 days    Creatinine, Ser  Date Value Ref Range Status  12/23/2020 1.04 0.76 - 1.27 mg/dL Final         Passed - Patient is not pregnant      Passed - Valid encounter within last 12 months    Recent Outpatient Visits          1 month ago Primary hypertension   [place], Karen, NP   2 months ago Primary hypertension   [facility] Jon Billings, NP      Future Appointments            In 3 weeks Ralene Bathe, MD [place]   In 1 month Jon Billings, NP [facility], [place]

## 2021-02-03 IMAGING — CT CT PELVIS W/ CM
2 of 3 series · 16 of 46 positions shown, 18 images · IV contrast (APPLIED)
Comparison: None.

CLINICAL DATA: 59-year-old with cellulitis involving the scrotum
extending into the perineal region.

EXAM:
CT PELVIS WITH CONTRAST
TECHNIQUE: Multidetector CT imaging of the pelvis was performed using the
standard protocol following the bolus administration of intravenous
contrast.
CONTRAST:  100mL OMNIPAQUE IOHEXOL 300 MG/ML IV.

[Series 2: axial st · axial · 0.82mm/px · z∈[-1164,-828]mm · 13 of 77 slices shown, 15 images]
[im 5/77  soft-tissue]
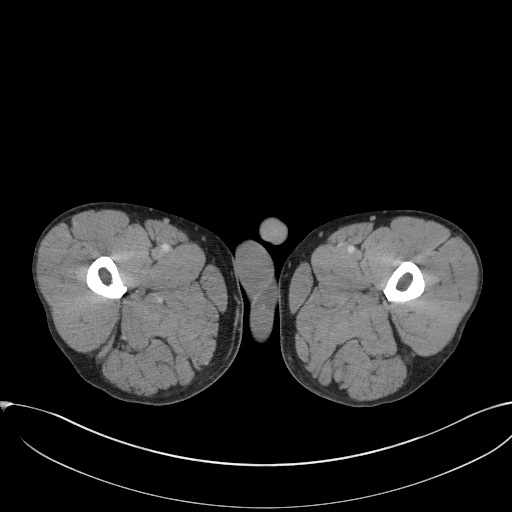
[im 5/77  bone]
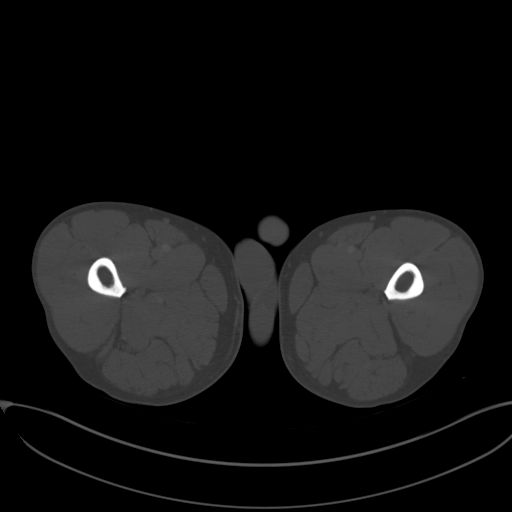
[im 10/77  soft-tissue]
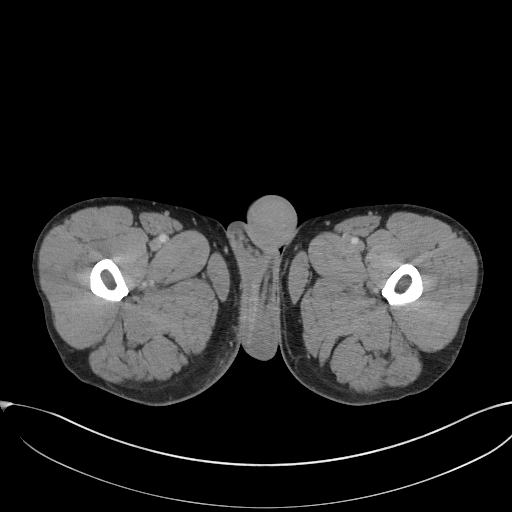
[im 15/77  soft-tissue]
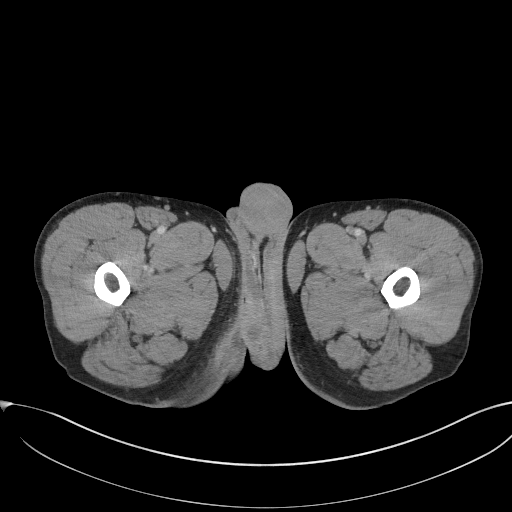
[im 23/77  soft-tissue]
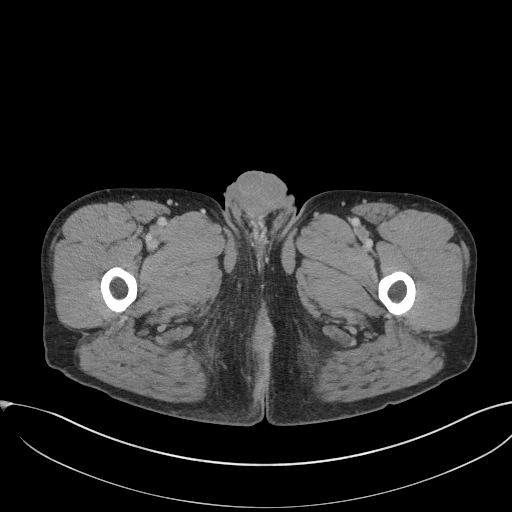
[im 27/77  soft-tissue]
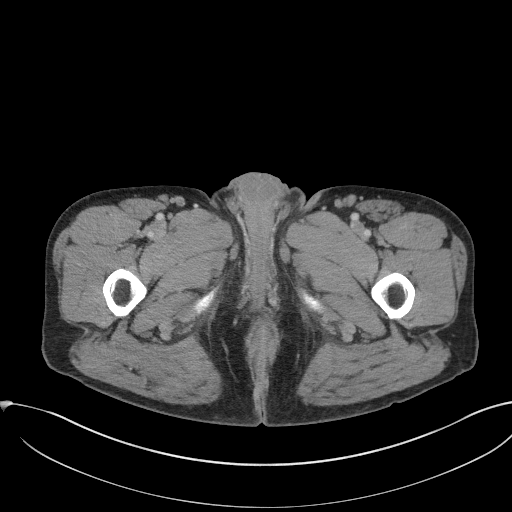
[im 32/77  soft-tissue]
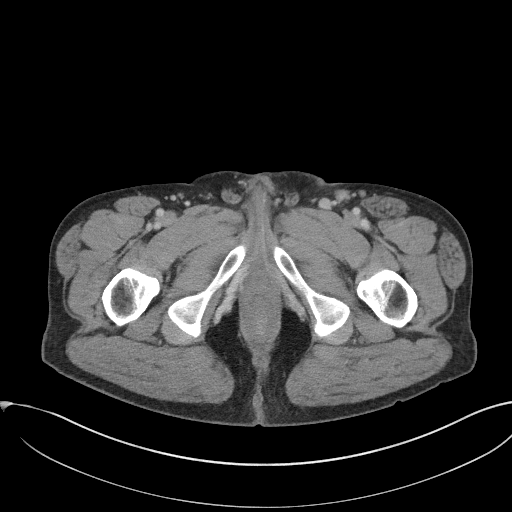
[im 40/77  soft-tissue]
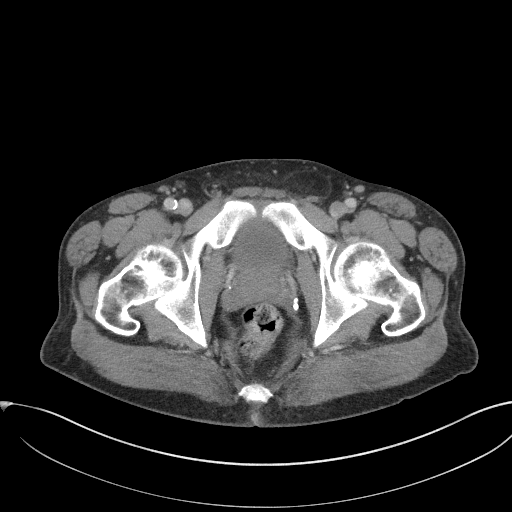
[im 45/77  soft-tissue]
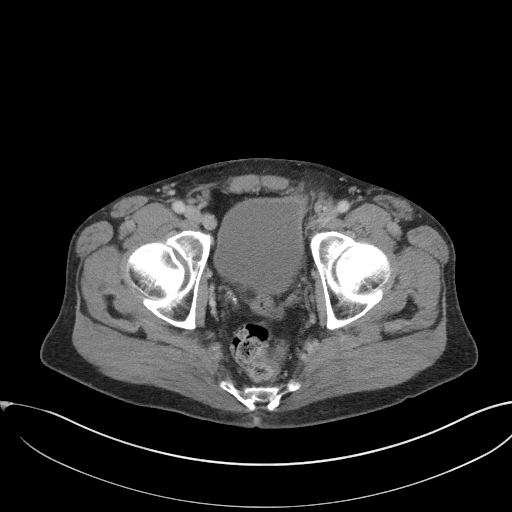
[im 50/77  soft-tissue]
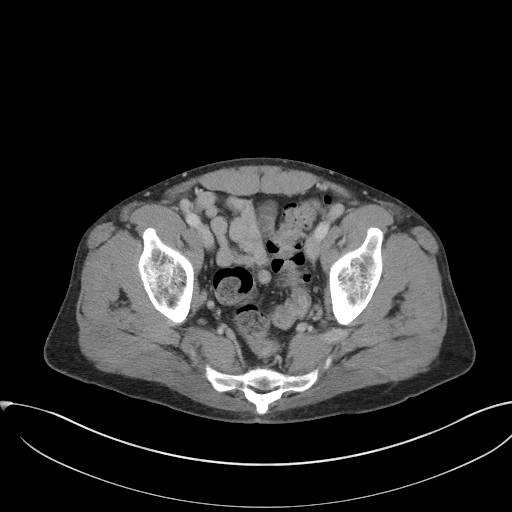
[im 50/77  bone]
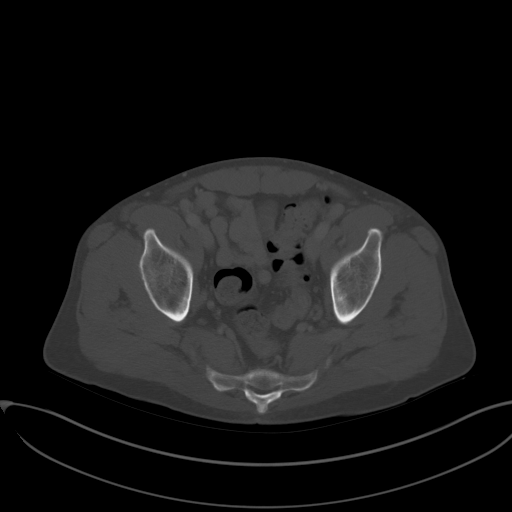
[im 54/77  soft-tissue]
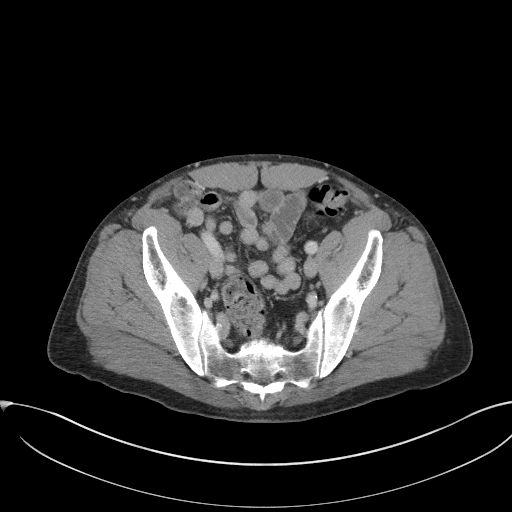
[im 62/77  soft-tissue]
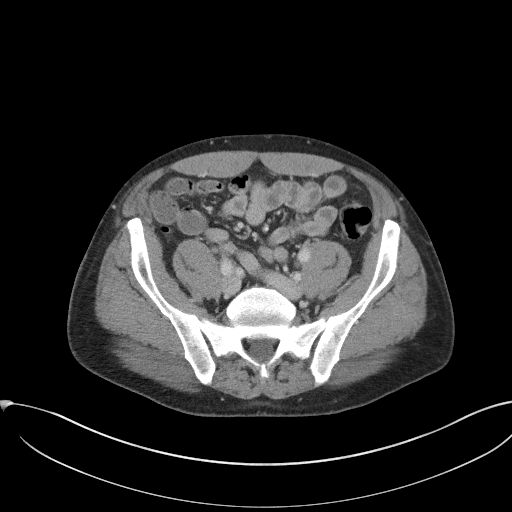
[im 67/77  soft-tissue]
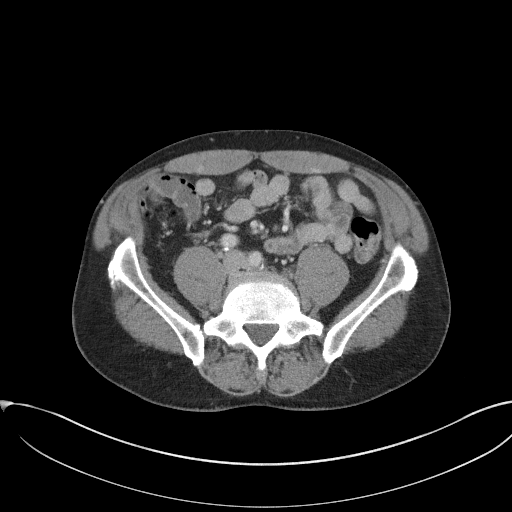
[im 72/77  soft-tissue]
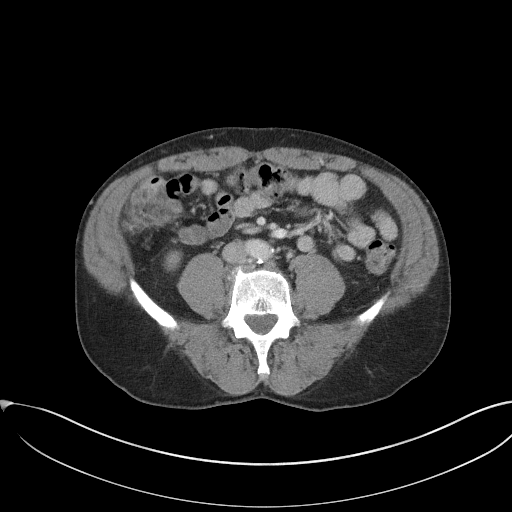

[Series 4: coronal st · coronal · 0.73mm/px · 3 of 76 slices shown]
[im 26/76  soft-tissue]
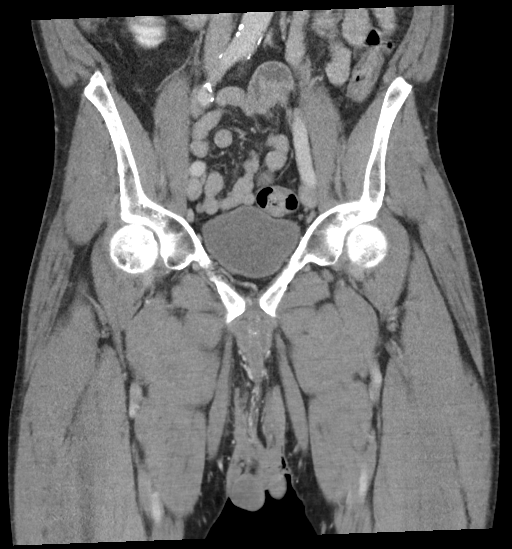
[im 34/76  soft-tissue]
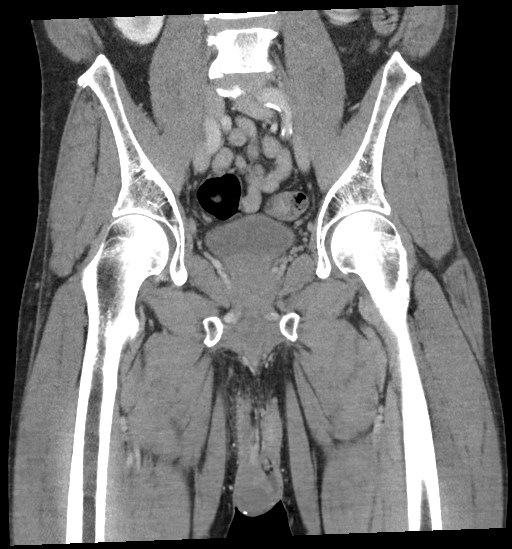
[im 42/76  soft-tissue]
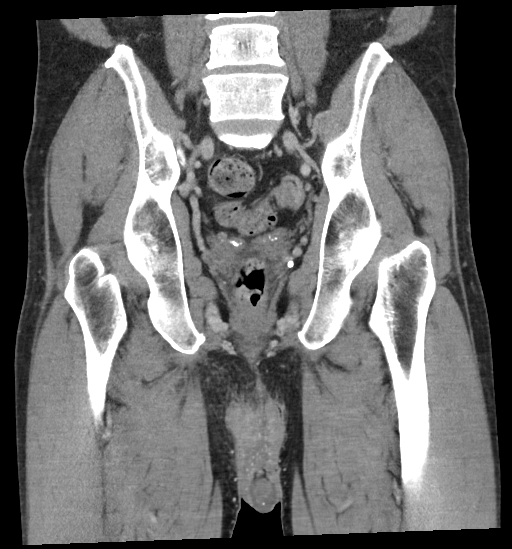

[16 of 46 positions shown; findings below may reference images not displayed]

FINDINGS: Urinary Tract: Visualized lower poles of both kidneys normal in
appearance. Diverticulum arising from the LEFT anterolateral wall of
the urinary bladder which extends into a LEFT inguinal hernia.

Bowel: Visualized small bowel and colon unremarkable. Normal
appearing appendix in the RIGHT upper pelvis.

Vascular/Lymphatic: Mild-to-moderate atherosclerosis involving the
distal abdominal aorta and the iliofemoral arteries bilaterally.

Multiple upper normal-sized enhancing BILATERAL superficial inguinal
lymph nodes. No deep pelvic lymphadenopathy.

Reproductive: Normal-appearing prostate gland. Calcifications
involving the otherwise normal appearing seminal vesicles. (See
below).

Other: Edema in the subcutaneous tissues of the RIGHT buttock, with
an associated fluid collection with a thick enhancing wall
(indicating abscess) at the gluteal fold and extending upward into
the RIGHT hemiscrotum. The gluteal fold abscess measures in total
approximately 7.0 x 3.2 x 4.4 cm with the fluid component measuring
approximately 3.9 x 2.0 x 3.1 cm. The scrotal abscess component
measures approximately 7.9 x 2.7 x 3.9 cm. No associated soft tissue
gas to confirm a diagnosis of Jamya gangrene currently.

Musculoskeletal: Degenerative disc disease at L4-5. No acute
findings.
IMPRESSION: 1. Abscess involving the RIGHT gluteal fold and extending upward
into the RIGHT hemiscrotum. The gluteal fold abscess measures
approximately 7.0 x 3.2 x 4.4 cm. The scrotal component measures
approximately 7.9 x 2.7 x 3.9 cm.
2. Multiple upper normal-sized enhancing BILATERAL superficial
inguinal lymph nodes, likely reactive.
3. Diverticulum arising from the LEFT anterolateral wall of the
urinary bladder which extends into a LEFT inguinal hernia.

Aortic Atherosclerosis (UJ5A7-[id number].D).

## 2021-02-17 ENCOUNTER — Other Ambulatory Visit: Payer: Self-pay

## 2021-02-17 ENCOUNTER — Ambulatory Visit (INDEPENDENT_AMBULATORY_CARE_PROVIDER_SITE_OTHER): Payer: Medicaid Other | Admitting: Dermatology

## 2021-02-17 DIAGNOSIS — L732 Hidradenitis suppurativa: Secondary | ICD-10-CM | POA: Diagnosis not present

## 2021-02-17 DIAGNOSIS — L700 Acne vulgaris: Secondary | ICD-10-CM

## 2021-02-17 MED ORDER — ISOTRETINOIN 20 MG PO CAPS: 20.0000 mg | ORAL_CAPSULE | Freq: Every day | ORAL | 0 refills | Status: AC

## 2021-02-17 NOTE — Progress Notes (Signed)
   Follow-Up Visit   Subjective  Caleb Brooks is a 60 y.o. male who presents for the following: Follow-up (Hidradenitis follow up - groin and buttocks - did not start Isotretinoin yet but he would like to start now. [facility] uses [facility] [place] now).  The following portions of the chart were reviewed this encounter and updated as appropriate:   Tobacco  Allergies  Meds  Problems  Med Hx  Surg Hx  Fam Hx     Review of Systems:  No other skin or systemic complaints except as noted in HPI or Assessment and Plan.  Objective  Well appearing patient in no apparent distress; mood and affect are within normal limits.  A focused examination was performed including groin area, buttocks. Relevant physical exam findings are noted in the Assessment and Plan.  Groin area and buttocks Fistulas and plaques with drainage of groin, buttocks area   Assessment & Plan  Hidradenitis suppurativa and acne Groin area and buttocks  Hidradenitis Suppurativa is a chronic; persistent; non-curable, but treatable condition due to abnormal inflamed sweat glands in the body folds (axilla, inframammary, groin, medial thighs), causing recurrent painful draining cysts and scarring. It can be associated with severe scarring acne and cysts; also abscesses and scarring of scalp. The goal is control and prevention of flares, as it is not curable. Scars are permanent and can be thickened. Treatment may include daily use of topical medication and oral antibiotics.  Oral isotretinoin may also be helpful.  For more severe cases, Humira (a biologic injection) may be prescribed to decrease the inflammatory process and prevent flares.  When indicated, inflamed cysts may also be treated surgically.  Pretreatment labs are reviewed today and are okay  Start Isotretinoin 20 mg 1 po qd  Ipledge [id number] [facility]  ISOtretinoin (ACCUTANE) 20 MG capsule - Groin area and buttocks Take 1 capsule (20 mg total) by mouth  daily.  Related Medications gabapentin (NEURONTIN) 300 MG capsule Take 1 capsule (300 mg total) by mouth 3 (three) times daily.  celecoxib (CELEBREX) 100 MG capsule TAKE 2 CAPSULES ('200MG'$  TOTAL) BY MOUTH TWICE A DAY.  Acne vulgaris Start Isotretinoin 20 mg 1 po qd as prescribed.  Return in about 1 month (around 03/19/2021) for Isotretinoin.  I, [name], CMA, am acting as scribe for [name], MD . Documentation: I have reviewed the above documentation for accuracy and completeness, and I agree with the above.  [name], MD

## 2021-02-17 NOTE — Patient Instructions (Signed)
Reviewed potential side effects of isotretinoin including xerosis, cheilitis, hepatitis, hyperlipidemia, and severe birth defects if taken by a pregnant woman. Reviewed reports of suicidal ideation in those with a history of depression while taking isotretinoin and reports of diagnosis of inflammatory bowl disease while taking isotretinoin as well as the lack of evidence for a causal relationship between isotretinoin, depression and IBD. Patient advised to reach out with any questions or concerns. Patient advised not to share pills or donate blood while on treatment or for one month after completing treatment.    If you have any questions or concerns for your doctor, please call our main line at [phone] and press option 4 to reach your doctor's medical assistant. If no one answers, please leave a voicemail as directed and we will return your call as soon as possible. Messages left after 4 pm will be answered the following business day.   You may also send [place] a message via [place]. We typically respond to MyChart messages within 1-2 business days.  For prescription refills, please ask your pharmacy to contact our office. Our fax number is [phone].  If you have an urgent issue when the clinic is closed that cannot wait until the next business day, you can page your doctor at the number below.    Please note that while we do our best to be available for urgent issues outside of office hours, we are not available 24/7.   If you have an urgent issue and are unable to reach [place], you may choose to seek medical care at your doctor's office, retail clinic, urgent care center, or emergency room.  If you have a medical emergency, please immediately call 911 or go to the emergency department.  Pager Numbers  - Dr. Nehemiah Massed: [phone]  - Dr. Laurence Ferrari: [phone]  - Dr. Nicole Kindred: [phone]  In the event of inclement weather, please call our main line at [phone] for an update on the status  of any delays or closures.  Dermatology Medication Tips: Please keep the boxes that topical medications come in in order to help keep track of the instructions about where and how to use these. Pharmacies typically print the medication instructions only on the boxes and not directly on the medication tubes.   If your medication is too expensive, please contact our office at [phone] option 4 or send [place] a message through [place].   We are unable to tell what your co-pay for medications will be in advance as this is different depending on your insurance coverage. However, we may be able to find a substitute medication at lower cost or fill out paperwork to get insurance to cover a needed medication.   If a prior authorization is required to get your medication covered by your insurance company, please allow [place] 1-2 business days to complete this process.  Drug prices often vary depending on where the prescription is filled and some pharmacies may offer cheaper prices.  The website www.goodrx.com contains coupons for medications through different pharmacies. The prices here do not account for what the cost may be with help from insurance (it may be cheaper with your insurance), but the website can give you the price if you did not use any insurance.  - You can print the associated coupon and take it with your prescription to the pharmacy.  - You may also stop by our office during regular business hours and pick up a GoodRx coupon card.  - If you need your prescription sent  electronically to a different pharmacy, notify our office through [facility] or by phone at [phone] option 4.

## 2021-02-21 ENCOUNTER — Encounter: Payer: Self-pay | Admitting: Dermatology

## 2021-02-26 ENCOUNTER — Encounter: Payer: Self-pay | Admitting: Nurse Practitioner

## 2021-03-10 ENCOUNTER — Encounter: Payer: Self-pay | Admitting: Nurse Practitioner

## 2021-03-10 ENCOUNTER — Other Ambulatory Visit: Payer: Self-pay

## 2021-03-10 ENCOUNTER — Ambulatory Visit (INDEPENDENT_AMBULATORY_CARE_PROVIDER_SITE_OTHER): Payer: Medicaid Other | Admitting: Nurse Practitioner

## 2021-03-10 VITALS — BP 118/79 | HR 72 | Ht 70.0 in | Wt 172.0 lb

## 2021-03-10 DIAGNOSIS — Z0000 Encounter for general adult medical examination without abnormal findings: Secondary | ICD-10-CM | POA: Diagnosis not present

## 2021-03-10 DIAGNOSIS — L732 Hidradenitis suppurativa: Secondary | ICD-10-CM | POA: Diagnosis not present

## 2021-03-10 DIAGNOSIS — K4190 Unilateral femoral hernia, without obstruction or gangrene, not specified as recurrent: Secondary | ICD-10-CM

## 2021-03-10 DIAGNOSIS — I10 Essential (primary) hypertension: Secondary | ICD-10-CM

## 2021-03-10 LAB — URINALYSIS, ROUTINE W REFLEX MICROSCOPIC
Bilirubin, UA: NEGATIVE
Glucose, UA: NEGATIVE
Ketones, UA: NEGATIVE
Leukocytes,UA: NEGATIVE
Nitrite, UA: NEGATIVE
RBC, UA: NEGATIVE
Specific Gravity, UA: 1.03 (ref 1.005–1.030)
Urobilinogen, Ur: 0.2 mg/dL (ref 0.2–1.0)
pH, UA: 5.5 (ref 5.0–7.5)

## 2021-03-10 LAB — MICROSCOPIC EXAMINATION
Bacteria, UA: NONE SEEN
WBC, UA: NONE SEEN /hpf (ref 0–5)

## 2021-03-10 MED ORDER — THIAMINE MONONITRATE 100 MG PO TABS: ORAL_TABLET | Freq: Every day | ORAL | 1 refills | Status: AC

## 2021-03-10 MED ORDER — DOXYCYCLINE HYCLATE 100 MG PO CAPS: 100.0000 mg | ORAL_CAPSULE | Freq: Every day | ORAL | 0 refills | Status: DC

## 2021-03-10 MED ORDER — GABAPENTIN 300 MG PO CAPS: 300.0000 mg | ORAL_CAPSULE | Freq: Three times a day (TID) | ORAL | 0 refills | Status: DC

## 2021-03-10 NOTE — Assessment & Plan Note (Signed)
Chronic.  Controlled.  Continue with current medication regimen of Amlodipine 5mg.  Labs ordered today.  Return to clinic in 3 months for reevaluation.  Call sooner if concerns arise.   

## 2021-03-10 NOTE — Assessment & Plan Note (Signed)
Ongoing. Not well controlled.  Patient saw Dr. Nehemiah Massed and was given Isotretinoin but he hasn't picked it up.  Called the pharmacy and the Isotretinoin is there and they will fill it for him to pick up.  Also recommend he keep his follow up with Dr. Drema Dallas.  Advised not to take Doxycyline and Isotretinoin together.  Patient understands and agrees.

## 2021-03-10 NOTE — Progress Notes (Signed)
BP 118/79   Pulse 72   Ht 5' 10"  (1.778 m)   Wt 172 lb (78 kg)   BMI 24.68 kg/m    Subjective:    Patient ID: Caleb Brooks, male    DOB: 1960-11-29, 60 y.o.   MRN: [id number]  HPI: Caleb Brooks is a 60 y.o. male presenting on 03/10/2021 for comprehensive medical examination. Current medical complaints include:none  He currently lives with: Interim Problems from his last visit: no  HYPERTENSION Hypertension status: controlled  Satisfied with current treatment? no Duration of hypertension: years BP monitoring frequency:  not checking BP range:  BP medication side effects:  no Medication compliance: excellent compliance Previous BP meds:amlodipine Aspirin: no Recurrent headaches: no Visual changes: no Palpitations: no Dyspnea: no Chest pain: no Lower extremity edema: no Dizzy/lightheaded: no  Depression Screen done today and results listed below:  Depression screen [facility] 2/9 11/19/2020 10/30/2020  Decreased Interest 1 3  Down, Depressed, Hopeless 1 3  PHQ - 2 Score 2 6  Altered sleeping 3 3  Tired, decreased energy 3 3  Change in appetite 3 1  Feeling bad or failure about yourself  0 0  Trouble concentrating 0 1  Moving slowly or fidgety/restless 0 0  Suicidal thoughts 0 0  PHQ-9 Score 11 14  Difficult doing work/chores Not difficult at all Very difficult    The patient does not have a history of falls. I did complete a risk assessment for falls. A plan of care for falls was documented.   Past Medical History:  Past Medical History:  Diagnosis Date   Anxiety    Arthritis    Gout    Hypertension     Surgical History:  Past Surgical History:  Procedure Laterality Date   CYST REMOVAL TRUNK     Groin area around 2000   EMBOLIZATION N/A 04/12/2020   Procedure: EMBOLIZATION, spleen;  Surgeon: Algernon Huxley, MD;  Location: [facility];  Service: Cardiovascular;  Laterality: N/A;   INCISION AND DRAINAGE ABSCESS N/A 07/26/2019   Procedure: INCISION AND  DRAINAGE ABSCESS;  Surgeon: Hollice Espy, MD;  Location: [facility];  Service: Urology;  Laterality: N/A;   INCISION AND DRAINAGE ABSCESS N/A 07/26/2019   Procedure: INCISION AND DRAINAGE ABSCESS;  Surgeon: Benjamine Sprague, DO;  Location: [facility];  Service: General;  Laterality: N/A;   LAPAROTOMY N/A 04/13/2020   Procedure: EXPLORATORY LAPAROTOMY;  Surgeon: Jules Husbands, MD;  Location: [facility];  Service: General;  Laterality: N/A;   SPLENECTOMY, TOTAL      Medications:  Current Outpatient Medications on File Prior to Visit  Medication Sig   amLODipine (NORVASC) 5 MG tablet TAKE ONE TABLET BY MOUTH EVERY DAY   celecoxib (CELEBREX) 100 MG capsule TAKE 2 CAPSULES (200MG TOTAL) BY MOUTH TWICE A DAY.   folic acid (FOLVITE) 1 MG tablet TAKE 1 TABLET BY MOUTH EVERY DAY   Multiple Vitamin (MULTIVITAMIN WITH MINERALS) TABS tablet Take 1 tablet by mouth daily.   nicotine (NICODERM CQ - DOSED IN MG/24 HOURS) 21 mg/24hr patch Place 1 patch (21 mg total) onto the skin daily.   ISOtretinoin (ACCUTANE) 20 MG capsule Take 1 capsule (20 mg total) by mouth daily. (Patient not taking: Reported on 03/10/2021)   No current facility-administered medications on file prior to visit.    Allergies:  Allergies  Allergen Reactions   Ibuprofen Other (See Comments)    Stomach upset    Social History:  Social History  Socioeconomic History   Marital status: Single    Spouse name: Not on file   Number of children: Not on file   Years of education: Not on file   Highest education level: Not on file  Occupational History   Occupation: unemployed  Tobacco Use   Smoking status: Every Day    Packs/day: 0.50    Years: 30.00    Pack years: 15.00    Types: Cigarettes   Smokeless tobacco: Never   Tobacco comments:    1/2- 1 pack per day  Vaping Use   Vaping Use: Never used  Substance and Sexual Activity   Alcohol use: Yes    Comment: 32 oz a few days per week   Drug use: Yes    Types: Cocaine     Comment: patient doesn't state how many times he uses in a week or when he last used   Sexual activity: Not Currently  Other Topics Concern   Not on file  Social History Narrative   Not on file   Social Determinants of Health   Financial Resource Strain: Unknown   Difficulty of Paying Living Expenses: Patient refused  Food Insecurity: No Food Insecurity   Worried About Running Out of Food in the Last Year: Never true   Ran Out of Food in the Last Year: Never true  Transportation Needs: No Transportation Needs   Lack of Transportation (Medical): No   Lack of Transportation (Non-Medical): No  Physical Activity: Not on file  Stress: Stress Concern Present   Feeling of Stress : Very much  Social Connections: Not on file  Intimate Partner Violence: Not on file   Social History   Tobacco Use  Smoking Status Every Day   Packs/day: 0.50   Years: 30.00   Pack years: 15.00   Types: Cigarettes  Smokeless Tobacco Never  Tobacco Comments   1/2- 1 pack per day   Social History   Substance and Sexual Activity  Alcohol Use Yes   Comment: 32 oz a few days per week    Family History:  Family History  Problem Relation Age of Onset   Arthritis Mother    Diabetes Father    Hypertension Father    Congestive Heart Failure Father    Hypertension Sister    CVA Sister    Bipolar disorder Brother    Schizophrenia Brother    Cancer Brother     Past medical history, surgical history, medications, allergies, family history and social history reviewed with patient today and changes made to appropriate areas of the chart.   Review of Systems  Eyes:  Negative for blurred vision and double vision.  Respiratory:  Negative for shortness of breath.   Cardiovascular:  Negative for chest pain, palpitations and leg swelling.  Musculoskeletal:        Boils in groin and lump under skin  Neurological:  Negative for dizziness and headaches.  All other ROS negative except what is listed above and  in the HPI.      Objective:    BP 118/79   Pulse 72   Ht 5' 10"  (1.778 m)   Wt 172 lb (78 kg)   BMI 24.68 kg/m   Wt Readings from Last 3 Encounters:  03/10/21 172 lb (78 kg)  12/23/20 158 lb 4 oz (71.8 kg)  11/19/20 157 lb 8 oz (71.4 kg)    Physical Exam Vitals and nursing note reviewed.  Constitutional:      General: He is not  in acute distress.    Appearance: Normal appearance. He is not ill-appearing, toxic-appearing or diaphoretic.  HENT:     Head: Normocephalic.     Right Ear: Tympanic membrane, ear canal and external ear normal.     Left Ear: Tympanic membrane, ear canal and external ear normal.     Nose: Nose normal. No congestion or rhinorrhea.     Mouth/Throat:     Mouth: Mucous membranes are moist.  Eyes:     General:        Right eye: No discharge.        Left eye: No discharge.     Extraocular Movements: Extraocular movements intact.     Conjunctiva/sclera: Conjunctivae normal.     Pupils: Pupils are equal, round, and reactive to light.  Cardiovascular:     Rate and Rhythm: Normal rate and regular rhythm.     Heart sounds: No murmur heard. Pulmonary:     Effort: Pulmonary effort is normal. No respiratory distress.     Breath sounds: Normal breath sounds. No wheezing, rhonchi or rales.  Abdominal:     General: Abdomen is flat. Bowel sounds are normal. There is no distension.     Palpations: Abdomen is soft.     Tenderness: There is no abdominal tenderness. There is no guarding.  Genitourinary:   Musculoskeletal:     Cervical back: Normal range of motion and neck supple.  Skin:    General: Skin is warm and dry.     Capillary Refill: Capillary refill takes less than 2 seconds.  Neurological:     General: No focal deficit present.     Mental Status: He is alert and oriented to person, place, and time.     Cranial Nerves: No cranial nerve deficit.     Motor: No weakness.     Deep Tendon Reflexes: Reflexes normal.  Psychiatric:        Mood and Affect:  Mood normal.        Behavior: Behavior normal.        Thought Content: Thought content normal.        Judgment: Judgment normal.    Results for orders placed or performed in visit on 12/23/20  Comp Met (CMET)  Result Value Ref Range   Glucose 78 65 - 99 mg/dL   BUN 11 6 - 24 mg/dL   Creatinine, Ser 1.04 0.76 - 1.27 mg/dL   eGFR 83 >59 mL/min/1.73   BUN/Creatinine Ratio 11 9 - 20   Sodium 140 134 - 144 mmol/L   Potassium 4.3 3.5 - 5.2 mmol/L   Chloride 102 96 - 106 mmol/L   CO2 24 20 - 29 mmol/L   Calcium 9.5 8.7 - 10.2 mg/dL   Total Protein 7.9 6.0 - 8.5 g/dL   Albumin 3.9 3.8 - 4.9 g/dL   Globulin, Total 4.0 1.5 - 4.5 g/dL   Albumin/Globulin Ratio 1.0 (L) 1.2 - 2.2   Bilirubin Total 0.4 0.0 - 1.2 mg/dL   Alkaline Phosphatase 168 (H) 44 - 121 IU/L   AST 14 0 - 40 IU/L   ALT 7 0 - 44 IU/L  Lipid Profile  Result Value Ref Range   Cholesterol, Total 128 100 - 199 mg/dL   Triglycerides 62 0 - 149 mg/dL   HDL 62 >39 mg/dL   VLDL Cholesterol Cal 13 5 - 40 mg/dL   LDL Chol Calc (NIH) 53 0 - 99 mg/dL   Chol/HDL Ratio 2.1 0.0 - 5.0 ratio  CBC w/Diff  Result Value Ref Range   WBC 8.5 3.4 - 10.8 x10E3/uL   RBC 4.74 4.14 - 5.80 x10E6/uL   Hemoglobin 13.7 13.0 - 17.7 g/dL   Hematocrit 42.0 37.5 - 51.0 %   MCV 89 79 - 97 fL   MCH 28.9 26.6 - 33.0 pg   MCHC 32.6 31.5 - 35.7 g/dL   RDW 14.1 11.6 - 15.4 %   Platelets 450 150 - 450 x10E3/uL   Neutrophils 55 Not Estab. %   Lymphs 26 Not Estab. %   Monocytes 13 Not Estab. %   Eos 5 Not Estab. %   Basos 1 Not Estab. %   Neutrophils Absolute 4.8 1.4 - 7.0 x10E3/uL   Lymphocytes Absolute 2.2 0.7 - 3.1 x10E3/uL   Monocytes Absolute 1.1 (H) 0.1 - 0.9 x10E3/uL   EOS (ABSOLUTE) 0.4 0.0 - 0.4 x10E3/uL   Basophils Absolute 0.0 0.0 - 0.2 x10E3/uL   Immature Granulocytes 0 Not Estab. %   Immature Grans (Abs) 0.0 0.0 - 0.1 x10E3/uL      Assessment & Plan:   Problem List Items Addressed This Visit       Cardiovascular and Mediastinum    Essential hypertension    Chronic.  Controlled.  Continue with current medication regimen of Amlodipine 22m.  Labs ordered today.  Return to clinic in 3 months for reevaluation.  Call sooner if concerns arise.        Relevant Medications   thiamine (VITAMIN B-1) 100 MG tablet     Musculoskeletal and Integument   Hidradenitis suppurativa   Relevant Medications   gabapentin (NEURONTIN) 300 MG capsule   Other Visit Diagnoses     Annual physical exam    -  Primary   Health maintenance reviewed today. Refused Colonoscopy. Flu shot given today. Labs ordered today.    Relevant Orders   TSH   PSA   Lipid panel   CBC with Differential/Platelet   Comprehensive metabolic panel   Urinalysis, Routine w reflex microscopic        Discussed aspirin prophylaxis for myocardial infarction prevention and decision was it was not indicated  LABORATORY TESTING:  Health maintenance labs ordered today as discussed above.   The natural history of prostate cancer and ongoing controversy regarding screening and potential treatment outcomes of prostate cancer has been discussed with the patient. The meaning of a false positive PSA and a false negative PSA has been discussed. He indicates understanding of the limitations of this screening test and wishes to proceed with screening PSA testing.   IMMUNIZATIONS:   - Tdap: Tetanus vaccination status reviewed: last tetanus booster within 10 years. - Influenza: Administered today - Pneumovax: Up to date - Prevnar: Up to date - HPV: Not applicable - Zostavax vaccine:  Discussed at visit today  SCREENING: - Colonoscopy: Refused  Discussed with patient purpose of the colonoscopy is to detect colon cancer at curable precancerous or early stages   - AAA Screening: Not applicable  -Hearing Test: Not applicable  -Spirometry: Not applicable   PATIENT COUNSELING:    Sexuality: Discussed sexually transmitted diseases, partner selection, use of condoms,  avoidance of unintended pregnancy  and contraceptive alternatives.   Advised to avoid cigarette smoking.  I discussed with the patient that most people either abstain from alcohol or drink within safe limits (<=14/week and <=4 drinks/occasion for males, <=7/weeks and <= 3 drinks/occasion for females) and that the risk for alcohol disorders and other health effects rises proportionally with the  number of drinks per week and how often a drinker exceeds daily limits.  Discussed cessation/primary prevention of drug use and availability of treatment for abuse.   Diet: Encouraged to adjust caloric intake to maintain  or achieve ideal body weight, to reduce intake of dietary saturated fat and total fat, to limit sodium intake by avoiding high sodium foods and not adding table salt, and to maintain adequate dietary potassium and calcium preferably from fresh fruits, vegetables, and low-fat dairy products.    stressed the importance of regular exercise  Injury prevention: Discussed safety belts, safety helmets, smoke detector, smoking near bedding or upholstery.   Dental health: Discussed importance of regular tooth brushing, flossing, and dental visits.   Follow up plan: NEXT PREVENTATIVE PHYSICAL DUE IN 1 YEAR. No follow-ups on file.

## 2021-03-11 LAB — CBC WITH DIFFERENTIAL/PLATELET
Basophils Absolute: 0 10*3/uL (ref 0.0–0.2)
Basos: 0 %
EOS (ABSOLUTE): 0.1 10*3/uL (ref 0.0–0.4)
Eos: 1 %
Hematocrit: 43.1 % (ref 37.5–51.0)
Hemoglobin: 14.2 g/dL (ref 13.0–17.7)
Immature Grans (Abs): 0 10*3/uL (ref 0.0–0.1)
Immature Granulocytes: 0 %
Lymphocytes Absolute: 1.7 10*3/uL (ref 0.7–3.1)
Lymphs: 17 %
MCH: 29.5 pg (ref 26.6–33.0)
MCHC: 32.9 g/dL (ref 31.5–35.7)
MCV: 90 fL (ref 79–97)
Monocytes Absolute: 0.9 10*3/uL (ref 0.1–0.9)
Monocytes: 9 %
Neutrophils Absolute: 7.2 10*3/uL — ABNORMAL HIGH (ref 1.4–7.0)
Neutrophils: 73 %
Platelets: 368 10*3/uL (ref 150–450)
RBC: 4.81 x10E6/uL (ref 4.14–5.80)
RDW: 13.4 % (ref 11.6–15.4)
WBC: 10 10*3/uL (ref 3.4–10.8)

## 2021-03-11 LAB — LIPID PANEL
Chol/HDL Ratio: 2.1 ratio (ref 0.0–5.0)
Cholesterol, Total: 137 mg/dL (ref 100–199)
HDL: 65 mg/dL (ref >39)
LDL Chol Calc (NIH): 62 mg/dL (ref 0–99)
Triglycerides: 44 mg/dL (ref 0–149)
VLDL Cholesterol Cal: 10 mg/dL (ref 5–40)

## 2021-03-11 LAB — COMPREHENSIVE METABOLIC PANEL
ALT: 8 IU/L (ref 0–44)
AST: 14 IU/L (ref 0–40)
Albumin/Globulin Ratio: 1 — ABNORMAL LOW (ref 1.2–2.2)
Albumin: 3.8 g/dL (ref 3.8–4.9)
Alkaline Phosphatase: 156 IU/L — ABNORMAL HIGH (ref 44–121)
BUN/Creatinine Ratio: 13 (ref 9–20)
BUN: 12 mg/dL (ref 6–24)
Bilirubin Total: 0.2 mg/dL (ref 0.0–1.2)
CO2: 22 mmol/L (ref 20–29)
Calcium: 9.3 mg/dL (ref 8.7–10.2)
Chloride: 103 mmol/L (ref 96–106)
Creatinine, Ser: 0.92 mg/dL (ref 0.76–1.27)
Globulin, Total: 3.9 g/dL (ref 1.5–4.5)
Glucose: 92 mg/dL (ref 70–99)
Potassium: 4.3 mmol/L (ref 3.5–5.2)
Sodium: 141 mmol/L (ref 134–144)
Total Protein: 7.7 g/dL (ref 6.0–8.5)
eGFR: 96 mL/min/{1.73_m2} (ref >59)

## 2021-03-11 LAB — PSA: Prostate Specific Ag, Serum: 0.7 ng/mL (ref 0.0–4.0)

## 2021-03-11 LAB — TSH: TSH: 1.37 u[IU]/mL (ref 0.450–4.500)

## 2021-03-11 NOTE — Progress Notes (Signed)
Please let patient know that his lab work looks good.  No concerns at this time. Continue with your current medication regimen.  Follow up as discussed.  Please let me know if you have any questions.

## 2021-03-12 NOTE — Progress Notes (Signed)
Patient can come in for an appointment to be evaluated or he can see if it resolves on its own.  Could be allergy/seasonal related.

## 2021-03-13 ENCOUNTER — Other Ambulatory Visit: Payer: Self-pay

## 2021-03-13 ENCOUNTER — Encounter: Payer: Self-pay | Admitting: Nurse Practitioner

## 2021-03-13 ENCOUNTER — Ambulatory Visit: Payer: Medicaid Other | Admitting: Nurse Practitioner

## 2021-03-13 VITALS — BP 123/83 | HR 75 | Temp 98.8°F | Wt 161.8 lb

## 2021-03-13 DIAGNOSIS — I10 Essential (primary) hypertension: Secondary | ICD-10-CM | POA: Diagnosis not present

## 2021-03-13 DIAGNOSIS — M546 Pain in thoracic spine: Secondary | ICD-10-CM

## 2021-03-13 DIAGNOSIS — G8929 Other chronic pain: Secondary | ICD-10-CM

## 2021-03-13 DIAGNOSIS — K047 Periapical abscess without sinus: Secondary | ICD-10-CM | POA: Diagnosis not present

## 2021-03-13 MED ORDER — AMOXICILLIN 500 MG PO CAPS: 500.0000 mg | ORAL_CAPSULE | Freq: Two times a day (BID) | ORAL | 0 refills | Status: AC

## 2021-03-13 MED ORDER — FOLIC ACID 1 MG PO TABS: 1.0000 mg | ORAL_TABLET | Freq: Every day | ORAL | 1 refills | Status: DC

## 2021-03-13 MED ORDER — GABAPENTIN 400 MG PO CAPS: 400.0000 mg | ORAL_CAPSULE | Freq: Three times a day (TID) | ORAL | 1 refills | Status: DC

## 2021-03-13 MED ORDER — CYCLOBENZAPRINE HCL 5 MG PO TABS: 5.0000 mg | ORAL_TABLET | Freq: Three times a day (TID) | ORAL | 1 refills | Status: DC | PRN

## 2021-03-13 NOTE — Progress Notes (Signed)
BP 123/83   Pulse 75   Temp 98.8 F (37.1 C) (Oral)   Wt 161 lb 12.8 oz (73.4 kg)   SpO2 98%   BMI 23.22 kg/m    Subjective:    Patient ID: Caleb Brooks, male    DOB: [birth year], 60 y.o.   MRN: [id number]  HPI: Caleb Brooks is a 60 y.o. male  Chief Complaint  Patient presents with   Sore Throat    Patient is here for sore throat for about 2-3 days.    Spasms    Patient is having issues with back spasms.     Patient states his throat is sore, his glands in his neck are swollen.  Denies runny nose, headaches, sob, fever, cough, ear pain.  Patient states he is having muscle spasms in the mid back. The pain has been going on for "a minute". Hasn't tried taking anything besides celebrex and gabapentin.  States those do help some     Relevant past medical, surgical, family and social history reviewed and updated as indicated. Interim medical history since our last visit reviewed. Allergies and medications reviewed and updated.  Review of Systems  HENT:         Neck pain on right side. Tooth pain  Musculoskeletal:  Positive for back pain.   Per HPI unless specifically indicated above     Objective:    BP 123/83   Pulse 75   Temp 98.8 F (37.1 C) (Oral)   Wt 161 lb 12.8 oz (73.4 kg)   SpO2 98%   BMI 23.22 kg/m   Wt Readings from Last 3 Encounters:  03/13/21 161 lb 12.8 oz (73.4 kg)  03/10/21 172 lb (78 kg)  12/23/20 158 lb 4 oz (71.8 kg)    Physical Exam Vitals and nursing note reviewed.  Constitutional:      General: He is not in acute distress.    Appearance: Normal appearance. He is not ill-appearing, toxic-appearing or diaphoretic.  HENT:     Head: Normocephalic.     Right Ear: Tympanic membrane and external ear normal.     Left Ear: Tympanic membrane and external ear normal.     Nose: Nose normal. No congestion or rhinorrhea.     Mouth/Throat:     Mouth: Mucous membranes are moist.     Pharynx: Posterior oropharyngeal erythema present. No  oropharyngeal exudate.  Eyes:     General:        Right eye: No discharge.        Left eye: No discharge.     Extraocular Movements: Extraocular movements intact.     Conjunctiva/sclera: Conjunctivae normal.     Pupils: Pupils are equal, round, and reactive to light.  Cardiovascular:     Rate and Rhythm: Normal rate and regular rhythm.     Heart sounds: No murmur heard. Pulmonary:     Effort: Pulmonary effort is normal. No respiratory distress.     Breath sounds: Normal breath sounds. No wheezing, rhonchi or rales.  Abdominal:     General: Abdomen is flat. Bowel sounds are normal.  Musculoskeletal:     Cervical back: Normal range of motion and neck supple.  Skin:    General: Skin is warm and dry.     Capillary Refill: Capillary refill takes less than 2 seconds.  Neurological:     General: No focal deficit present.     Mental Status: He is alert and oriented to person, place, and time.  Psychiatric:        Mood and Affect: Mood normal.        Behavior: Behavior normal.        Thought Content: Thought content normal.        Judgment: Judgment normal.    Results for orders placed or performed in visit on 03/10/21  Microscopic Examination   Urine  Result Value Ref Range   WBC, UA None seen 0 - 5 /hpf   RBC 0-2 0 - 2 /hpf   Epithelial Cells (non renal) 0-10 0 - 10 /hpf   Bacteria, UA None seen None seen/Few  TSH  Result Value Ref Range   TSH 1.370 0.450 - 4.500 uIU/mL  PSA  Result Value Ref Range   Prostate Specific Ag, Serum 0.7 0.0 - 4.0 ng/mL  Lipid panel  Result Value Ref Range   Cholesterol, Total 137 100 - 199 mg/dL   Triglycerides 44 0 - 149 mg/dL   HDL 65 >39 mg/dL   VLDL Cholesterol Cal 10 5 - 40 mg/dL   LDL Chol Calc (NIH) 62 0 - 99 mg/dL   Chol/HDL Ratio 2.1 0.0 - 5.0 ratio  CBC with Differential/Platelet  Result Value Ref Range   WBC 10.0 3.4 - 10.8 x10E3/uL   RBC 4.81 4.14 - 5.80 x10E6/uL   Hemoglobin 14.2 13.0 - 17.7 g/dL   Hematocrit 43.1 37.5 -  51.0 %   MCV 90 79 - 97 fL   MCH 29.5 26.6 - 33.0 pg   MCHC 32.9 31.5 - 35.7 g/dL   RDW 13.4 11.6 - 15.4 %   Platelets 368 150 - 450 x10E3/uL   Neutrophils 73 Not Estab. %   Lymphs 17 Not Estab. %   Monocytes 9 Not Estab. %   Eos 1 Not Estab. %   Basos 0 Not Estab. %   Neutrophils Absolute 7.2 (H) 1.4 - 7.0 x10E3/uL   Lymphocytes Absolute 1.7 0.7 - 3.1 x10E3/uL   Monocytes Absolute 0.9 0.1 - 0.9 x10E3/uL   EOS (ABSOLUTE) 0.1 0.0 - 0.4 x10E3/uL   Basophils Absolute 0.0 0.0 - 0.2 x10E3/uL   Immature Granulocytes 0 Not Estab. %   Immature Grans (Abs) 0.0 0.0 - 0.1 x10E3/uL  Comprehensive metabolic panel  Result Value Ref Range   Glucose 92 70 - 99 mg/dL   BUN 12 6 - 24 mg/dL   Creatinine, Ser 0.92 0.76 - 1.27 mg/dL   eGFR 96 >59 mL/min/1.73   BUN/Creatinine Ratio 13 9 - 20   Sodium 141 134 - 144 mmol/L   Potassium 4.3 3.5 - 5.2 mmol/L   Chloride 103 96 - 106 mmol/L   CO2 22 20 - 29 mmol/L   Calcium 9.3 8.7 - 10.2 mg/dL   Total Protein 7.7 6.0 - 8.5 g/dL   Albumin 3.8 3.8 - 4.9 g/dL   Globulin, Total 3.9 1.5 - 4.5 g/dL   Albumin/Globulin Ratio 1.0 (L) 1.2 - 2.2   Bilirubin Total 0.2 0.0 - 1.2 mg/dL   Alkaline Phosphatase 156 (H) 44 - 121 IU/L   AST 14 0 - 40 IU/L   ALT 8 0 - 44 IU/L  Urinalysis, Routine w reflex microscopic  Result Value Ref Range   Specific Gravity, UA 1.030 1.005 - 1.030   pH, UA 5.5 5.0 - 7.5   Color, UA Yellow Yellow   Appearance Ur Hazy (A) Clear   Leukocytes,UA Negative Negative   Protein,UA Trace (A) Negative/Trace   Glucose, UA Negative Negative   Ketones,  UA Negative Negative   RBC, UA Negative Negative   Bilirubin, UA Negative Negative   Urobilinogen, Ur 0.2 0.2 - 1.0 mg/dL   Nitrite, UA Negative Negative   Microscopic Examination See below:       Assessment & Plan:   Problem List Items Addressed This Visit       Cardiovascular and Mediastinum   Essential hypertension   Relevant Medications   folic acid (FOLVITE) 1 MG tablet    Other Visit Diagnoses     Dental infection    -  Primary   Amoxicillin given due to swollen lymph node and dental infection. Patient has an appt with his dentist on Monday. Recommend to keep that appt.         Follow up plan: No follow-ups on file.

## 2021-03-13 NOTE — Assessment & Plan Note (Signed)
Refills sent for patient during visit today.

## 2021-03-17 ENCOUNTER — Other Ambulatory Visit: Payer: Self-pay

## 2021-03-17 ENCOUNTER — Encounter: Payer: Medicaid Other | Admitting: Dermatology

## 2021-03-19 NOTE — Progress Notes (Signed)
This encounter was created in error - please disregard.

## 2021-03-21 ENCOUNTER — Encounter: Payer: Self-pay | Admitting: Surgery

## 2021-03-21 ENCOUNTER — Other Ambulatory Visit: Payer: Self-pay

## 2021-03-21 ENCOUNTER — Ambulatory Visit (INDEPENDENT_AMBULATORY_CARE_PROVIDER_SITE_OTHER): Payer: Medicaid Other | Admitting: Surgery

## 2021-03-21 VITALS — BP 112/80 | HR 78 | Temp 98.4°F | Ht 71.0 in | Wt 165.0 lb

## 2021-03-21 DIAGNOSIS — K4090 Unilateral inguinal hernia, without obstruction or gangrene, not specified as recurrent: Secondary | ICD-10-CM

## 2021-03-21 NOTE — Patient Instructions (Addendum)
We have spoken to you about options of having your hernia repaired.    When you are ready to have this done just call our office.   You will need to arrange to be out of work for 2 weeks and then return with a lifting restrictions for 4 more weeks. Please send any FMLA paperwork prior to surgery and we will fill this out and fax it back to your employer within 3 business days.  You may have a bruise in your groin and also swelling and brusing in your testicle area. You may use ice 4-5 times daily for 15-20 minutes each time. Make sure that you place a barrier between you and the ice pack. To decrease the swelling, you may roll up a bath towel and place it vertically in between your thighs with your testicles resting on the towel. You will want to keep this area elevated as much as possible for several days following surgery.   Inguinal Hernia, Adult Muscles help keep everything in the body in its proper place. But if a weak spot in the muscles develops, something can poke through. That is called a hernia. When this happens in the lower part of the belly (abdomen), it is called an inguinal hernia. (It takes its name from a part of the body in this region called the inguinal canal.) A weak spot in the wall of muscles lets some fat or part of the small intestine bulge through. An inguinal hernia can develop at any age. Men get them more often than women. CAUSES  In adults, an inguinal hernia develops over time. It can be triggered by: Suddenly straining the muscles of the lower abdomen. Lifting heavy objects. Straining to have a bowel movement. Difficult bowel movements (constipation) can lead to this. Constant coughing. This may be caused by smoking or lung disease. Being overweight. Being pregnant. Working at a job that requires long periods of standing or heavy lifting. Having had an inguinal hernia before. One type can be an emergency situation. It is called a strangulated inguinal hernia. It  develops if part of the small intestine slips through the weak spot and cannot get back into the abdomen. The blood supply can be cut off. If that happens, part of the intestine may die. This situation requires emergency surgery. SYMPTOMS  Often, a small inguinal hernia has no symptoms. It is found when a healthcare provider does a physical exam. Larger hernias usually have symptoms.  In adults, symptoms may include: A lump in the groin. This is easier to see when the person is standing. It might disappear when lying down. In men, a lump in the scrotum. Pain or burning in the groin. This occurs especially when lifting, straining or coughing. A dull ache or feeling of pressure in the groin. Signs of a strangulated hernia can include: A bulge in the groin that becomes very painful and tender to the touch. A bulge that turns red or purple. Fever, nausea and vomiting. Inability to have a bowel movement or to pass gas. DIAGNOSIS  To decide if you have an inguinal hernia, a healthcare provider will probably do a physical examination. This will include asking questions about any symptoms you have noticed. The healthcare provider might feel the groin area and ask you to cough. If an inguinal hernia is felt, the healthcare provider may try to slide it back into the abdomen. Usually no other tests are needed. TREATMENT  Treatments can vary. The size of the hernia makes  a difference. Options include: Watchful waiting. This is often suggested if the hernia is small and you have had no symptoms. No medical procedure will be done unless symptoms develop. You will need to watch closely for symptoms. If any occur, contact your healthcare provider right away. Surgery. This is used if the hernia is larger or you have symptoms. Open surgery. This is usually an outpatient procedure (you will not stay overnight in a hospital). An cut (incision) is made through the skin in the groin. The hernia is put back inside  the abdomen. The weak area in the muscles is then repaired by herniorrhaphy or hernioplasty. Herniorrhaphy: in this type of surgery, the weak muscles are sewn back together. Hernioplasty: a patch or mesh is used to close the weak area in the abdominal wall. Laparoscopy. In this procedure, a surgeon makes small incisions. A thin tube with a tiny video camera (called a laparoscope) is put into the abdomen. The surgeon repairs the hernia with mesh by looking with the video camera and using two long instruments. HOME CARE INSTRUCTIONS  After surgery to repair an inguinal hernia: You will need to take pain medicine prescribed by your healthcare provider. Follow all directions carefully. You will need to take care of the wound from the incision. Your activity will be restricted for awhile. This will probably include no heavy lifting for several weeks. You also should not do anything too active for a few weeks. When you can return to work will depend on the type of job that you have. During "watchful waiting" periods, you should: Maintain a healthy weight. Eat a diet high in fiber (fruits, vegetables and whole grains). Drink plenty of fluids to avoid constipation. This means drinking enough water and other liquids to keep your urine clear or pale yellow. Do not lift heavy objects. Do not stand for long periods of time. Quit smoking. This should keep you from developing a frequent cough. SEEK MEDICAL CARE IF:  A bulge develops in your groin area. You feel pain, a burning sensation or pressure in the groin. This might be worse if you are lifting or straining. You develop a fever of more than 100.5 F (38.1 C). SEEK IMMEDIATE MEDICAL CARE IF:  Pain in the groin increases suddenly. A bulge in the groin gets bigger suddenly and does not go down. For men, there is sudden pain in the scrotum. Or, the size of the scrotum increases. A bulge in the groin area becomes red or purple and is painful to touch. You  have nausea or vomiting that does not go away. You feel your heart beating much faster than normal. You cannot have a bowel movement or pass gas. You develop a fever of more than 102.0 F (38.9 C).   ________________________________________________________________________________________  Hiatal Hernia A hiatal hernia occurs when part of the stomach slides above the muscle that separates the abdomen from the chest (diaphragm). A person can be born with a hiatal hernia (congenital), or it may develop over time. In almost all cases of hiatal hernia, only the top part of the stomach pushes through the diaphragm. Many people have a hiatal hernia with no symptoms. The larger the hernia, the more likely it is that you will have symptoms. In some cases, a hiatal hernia allows stomach acid to flow back into the tube that carries food from your mouth to your stomach (esophagus). This may cause heartburn symptoms. Severe heartburn symptoms may mean that you have developed a condition called gastroesophageal  reflux disease (GERD). What are the causes? This condition is caused by a weakness in the opening (hiatus) where the esophagus passes through the diaphragm to attach to the upper part of the stomach. A person may be born with a weakness in the hiatus, or a weakness can develop over time. What increases the risk? This condition is more likely to develop in: Older people. Age is a major risk factor for a hiatal hernia, especially if you are over the age of 20. Pregnant women. People who are overweight. People who have frequent constipation. What are the signs or symptoms? Symptoms of this condition usually develop in the form of GERD symptoms. Symptoms include: Heartburn. Belching. Indigestion. Trouble swallowing. Coughing or wheezing. Sore throat. Hoarseness. Chest pain. Nausea and vomiting. How is this diagnosed? This condition may be diagnosed during testing for GERD. Tests that may be done  include: X-rays of your stomach or chest. An upper gastrointestinal (GI) series. This is an X-ray exam of your GI tract that is taken after you swallow a chalky liquid that shows up clearly on the X-ray. Endoscopy. This is a procedure to look into your stomach using a thin, flexible tube that has a tiny camera and light on the end of it. How is this treated? This condition may be treated by: Dietary and lifestyle changes to help reduce GERD symptoms. Medicines. These may include: Over-the-counter antacids. Medicines that make your stomach empty more quickly. Medicines that block the production of stomach acid (H2 blockers). Stronger medicines to reduce stomach acid (proton pump inhibitors). Surgery to repair the hernia, if other treatments are not helping. If you have no symptoms, you may not need treatment. Follow these instructions at home: Lifestyle and activity Do not use any products that contain nicotine or tobacco, such as cigarettes and e-cigarettes. If you need help quitting, ask your health care provider. Try to achieve and maintain a healthy body weight. Avoid putting pressure on your abdomen. Anything that puts pressure on your abdomen increases the amount of acid that may be pushed up into your esophagus. Avoid bending over, especially after eating. Raise the head of your bed by putting blocks under the legs. This keeps your head and esophagus higher than your stomach. Do not wear tight clothing around your chest or stomach. Try not to strain when having a bowel movement, when urinating, or when lifting heavy objects. Eating and drinking Avoid foods that can worsen GERD symptoms. These may include: Fatty foods, like fried foods. Citrus fruits, like oranges or lemon. Other foods and drinks that contain acid, like orange juice or tomatoes. Spicy food. Chocolate. Eat frequent small meals instead of three large meals a day. This helps prevent your stomach from getting too  full. Eat slowly. Do not lie down right after eating. Do not eat 1-2 hours before bed. Do not drink beverages with caffeine. These include cola, coffee, cocoa, and tea. Do not drink alcohol. General instructions Take over-the-counter and prescription medicines only as told by your health care provider. Keep all follow-up visits as told by your health care provider. This is important. Contact a health care provider if: Your symptoms are not controlled with medicines or lifestyle changes. You are having trouble swallowing. You have coughing or wheezing that will not go away. Get help right away if: Your pain is getting worse. Your pain spreads to your arms, neck, jaw, teeth, or back. You have shortness of breath. You sweat for no reason. You feel sick to your stomach (  nauseous) or you vomit. You vomit blood. You have bright red blood in your stools. You have black, tarry stools. Summary A hiatal hernia occurs when part of the stomach slides above the muscle that separates the abdomen from the chest (diaphragm). A person may be born with a weakness in the hiatus, or a weakness can develop over time. Symptoms of hiatal hernia may include heartburn, trouble swallowing, or sore throat. Management of hiatal hernia includes eating frequent small meals instead of three large meals a day. Get help right away if you vomit blood, have bright red blood in your stools, or have black, tarry stools. This information is not intended to replace advice given to you by your health care provider. Make sure you discuss any questions you have with your health care provider. Document Revised: 04/25/2020 Document Reviewed: 04/25/2020 Elsevier Patient Education  2022 [employer].

## 2021-03-21 NOTE — Progress Notes (Signed)
03/21/2021  Reason for Visit:  Left inguinal hernias  Referring Provider:  Jon Billings, NP  History of Present Illness: Caleb Brooks is a 60 y.o. male presenting for evaluation of a left inguinal hernia.  The patient does have a history of exploratory laparotomy and open splenectomy with Dr. Dahlia Byes on 04/13/2020 for bleeding from his spleen which was not responsive to coil embolization.  On that CT scan for that admission, the patient was found to have bilateral inguinal hernias with the left side being larger than the right and also containing a corner of the patient's bladder.  This was not addressed at the time clearly given the emergency situation of his other presenting issue.  Patient reports that he has had these hernias particular on the left side since he was a teenager.  Reports that they have not really worsened over the course of time significantly.  Denies any particular pain in the left groin and denies any symptoms on the right.  With the left groin, he does report bulging which he can push back on his own without any discomfort or pain.  Denies any pain, burning sensation.  Despite that on CT scan his left hernia contains a corner of the bladder, he denies any urinary difficulty or symptoms.  He denies any constipation or diarrhea.  However he does feel that sometimes when he eats a heavy meal he gets a lot of pain in the upper abdomen.  Patient also reports having had multiple procedures for incision and drainage and excisions of boils.  He does suffer from hidradenitis.  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  Past Medical History: Past Medical History:  Diagnosis Date   Anxiety    Arthritis    Gout    Hypertension      Past Surgical History: Past Surgical History:  Procedure Laterality Date   CYST REMOVAL TRUNK     Groin area around 2000   EMBOLIZATION N/A 04/12/2020   Procedure: EMBOLIZATION, spleen;   Surgeon: Algernon Huxley, MD;  Location: [facility];  Service: Cardiovascular;  Laterality: N/A;   INCISION AND DRAINAGE ABSCESS N/A 07/26/2019   Procedure: INCISION AND DRAINAGE ABSCESS;  Surgeon: Hollice Espy, MD;  Location: [facility];  Service: Urology;  Laterality: N/A;   INCISION AND DRAINAGE ABSCESS N/A 07/26/2019   Procedure: INCISION AND DRAINAGE ABSCESS;  Surgeon: Benjamine Sprague, DO;  Location: [facility];  Service: General;  Laterality: N/A;   LAPAROTOMY N/A 04/13/2020   Procedure: EXPLORATORY LAPAROTOMY;  Surgeon: Jules Husbands, MD;  Location: [facility];  Service: General;  Laterality: N/A;   SPLENECTOMY, TOTAL      Home Medications: Prior to Admission medications   Medication Sig Start Date End Date Taking? Authorizing Provider  amLODipine (NORVASC) 5 MG tablet TAKE ONE TABLET BY MOUTH EVERY DAY 12/23/20 12/23/21 Yes Jon Billings, NP  cyclobenzaprine (FLEXERIL) 5 MG tablet Take 1 tablet (5 mg total) by mouth 3 (three) times daily as needed for muscle spasms. 03/13/21  Yes Jon Billings, NP  folic acid (FOLVITE) 1 MG tablet Take 1 tablet (1 mg total) by mouth daily. 03/13/21  Yes Jon Billings, NP  gabapentin (NEURONTIN) 400 MG capsule Take 1 capsule (400 mg total) by mouth 3 (three) times daily. 03/13/21  Yes Jon Billings, NP  ISOtretinoin (ACCUTANE) 20 MG capsule Take 20 mg by mouth daily.   Yes [provider]  Multiple  Vitamin (MULTIVITAMIN WITH MINERALS) TABS tablet Take 1 tablet by mouth daily. 11/19/20  Yes Jon Billings, NP  nicotine (NICODERM CQ - DOSED IN MG/24 HOURS) 21 mg/24hr patch Place 1 patch (21 mg total) onto the skin daily. 11/19/20  Yes Jon Billings, NP  thiamine (VITAMIN B-1) 100 MG tablet TAKE ONE TABLET BY MOUTH EVERY DAY 03/10/21 03/10/22 Yes Jon Billings, NP    Allergies: Allergies  Allergen Reactions   Ibuprofen Other (See Comments)    Stomach upset    Social History:  reports that he has been smoking cigarettes. He  has a 15.00 pack-year smoking history. He has never used smokeless tobacco. He reports current alcohol use. He reports current drug use. Drugs: Cocaine and Marijuana.   Family History: Family History  Problem Relation Age of Onset   Arthritis Mother    Diabetes Father    Hypertension Father    Congestive Heart Failure Father    Hypertension Sister    CVA Sister    Bipolar disorder Brother    Schizophrenia Brother    Cancer Brother     Review of Systems: Review of Systems  Constitutional:  Negative for chills and fever.  HENT:  Negative for hearing loss.   Respiratory:  Negative for shortness of breath.   Cardiovascular:  Negative for chest pain.  Gastrointestinal:  Negative for abdominal pain, nausea and vomiting.  Genitourinary:  Negative for dysuria.  Musculoskeletal:  Positive for joint pain. Negative for myalgias.  Skin:  Negative for rash.  Neurological:  Negative for dizziness.  Psychiatric/Behavioral:  Negative for depression.    Physical Exam BP 112/80   Pulse 78   Temp 98.4 F (36.9 C)   Ht 5\' 11"  (1.803 m)   Wt 165 lb (74.8 kg)   SpO2 99%   BMI 23.01 kg/m  CONSTITUTIONAL: No acute distress, well-nourished HEENT:  Normocephalic, atraumatic, extraocular motion intact. NECK: Trachea is midline, and there is no jugular venous distension.  RESPIRATORY:  Lungs are clear, and breath sounds are equal bilaterally. Normal respiratory effort without pathologic use of accessory muscles. CARDIOVASCULAR: Heart is regular without murmurs, gallops, or rubs. GI: The abdomen is soft, nondistended, nontender to palpation.  Patient has not upper midline incision which is very well-healed without any evidence of a hernia there.  On exam, the patient does have a reducible left inguinal hernia but I am unable to discern a true hernia defect on the right side and may just be an area of weakening.  MUSCULOSKELETAL:  Normal muscle strength and tone in all four extremities.  No peripheral  edema or cyanosis. SKIN: Skin turgor is normal. There are no pathologic skin lesions.  NEUROLOGIC:  Motor and sensation is grossly normal.  Cranial nerves are grossly intact. PSYCH:  Alert and oriented to person, place and time. Affect is normal.  Laboratory Analysis: Labs from 03/10/2021: Sodium 141, potassium 4.3, chloride 103, CO2 22, BUN 12, creatinine 0.92.  Total bilirubin 0.2, AST 14, ALT 8, alkaline phosphatase 156.  WBC 10.0, hemoglobin 14.2, hematocrit 43.1, platelets 368.  PSA 0.7, TSH 1.37  Imaging: CT scan of abdomen and pelvis on 04/12/2020: IMPRESSION: 1. Active contrast extravasation arising from the superior pole/dome of the spleen contributing both to a large subcapsular splenic hematoma and moderate volume hemoperitoneum with sentinel clot in the left upper quadrant. 2. Slightly heterogeneous and nodular liver, could correlate for features of intrinsic liver disease/cirrhosis. Consider correlation with LFTs. 3. Skin thickening and subcutaneous fat stranding along the left inguinal  crease concerning for a cellulitis with some adjacent reactive adenopathy. 4. More extensive skin thickening and subcutaneous phlegmon without organized abscess along the gluteal cleft, right greater than left with extension to the intersphincteric plane. No clear abscess though inflammatory changes extend below the margin of imaging. Furthermore, fistulization is difficult to exclude on CT imaging. Recommend correlation with visual inspection. 5. Small though slightly enlarging collection contiguous with the dermis along the more posterior right gluteal soft tissues. Could reflect a small dermal inclusion cysts though should correlate with visual inspection. 6. Bilateral fat containing inguinal hernias with small amount of fluid and partial herniation of the left anterolateral bladder into the left inguinal hernia sac. No bowel containing hernias. 7. 1 cm intermediate attenuation nodule in the left  adrenal gland, incompletely characterized on this exam. Likely a benign adrenal adenoma though should consider with 1 year follow-up adrenal  washout CT. This recommendation follows ACR consensus guidelines: Management of Incidental Adrenal Masses: A White Paper of the ACR Incidental Findings Committee. J Am Coll Radiol 2017;14:1038-1044. 8. Some patchy ground-glass opacity in the right lung base greater than left, possibly atelectasis versus atypical infection, difficult to delineate given extensive respiratory motion. 9. Aortic Atherosclerosis (ICD10-I70.0).  Assessment and Plan: This is a 60 y.o. male with a left inguinal hernia.  - Discussed with the patient that his left inguinal hernia is reducible and clearly palpable worse I am uncertain if there is truly a hernia on the right side versus an area of weakening containing cord lipoma in the inguinal canal.  He denies any symptoms on the right side.  On the left side, he reports that he is able to reduce the hernia and is otherwise asymptomatic.  He is wondering if there is any intervention is needed.  He would rather stay away from any surgical intervention unless needed.  I discussed with him that given the lack of symptoms and how easily reducible his hernia is, we currently do not have any pressing concern to repair his hernia right away.  Discussed with him the 3 types of hernias including reducible, incarcerated, and strangulated.  Discussed with him that over the course of time, the natural progression of the hernias to get bigger and potentially start having symptoms in the future.  However at this point, we can be flexible and proceed with watchful waiting.  Discussed with him that if he feels any symptoms going forwards or if the hernia is getting bigger or more difficult to reduce, he should contact us so that we can see him again and discuss potential surgical plans with him.  Discussed with him that if he ever does need surgery, my goal  would be to keep this is a minimally invasive surgery rather than a big open surgery compared to what he had for his spleen. - All of his questions have been answered and he understands this plan. - Follow-up as needed  Face-to-face time spent with the patient and care providers was 60 minutes, with more than 50% of the time spent counseling, educating, and coordinating care of the patient.     Melvyn Neth, [facility]

## 2021-03-23 ENCOUNTER — Other Ambulatory Visit: Payer: Self-pay | Admitting: Nurse Practitioner

## 2021-03-23 DIAGNOSIS — L732 Hidradenitis suppurativa: Secondary | ICD-10-CM

## 2021-03-23 NOTE — Telephone Encounter (Signed)
The original prescription was discontinued on 03/21/2021 by Lesly Rubenstein, LPN for the following reason: Error. Renewing this prescription may not be appropriate   Called [facility] and spoke with Thurmond Butts (pharmacist) and informed him celebrex was dc'd- stated he will remove from profile.

## 2021-04-07 ENCOUNTER — Encounter: Payer: Self-pay | Admitting: Dermatology

## 2021-04-07 ENCOUNTER — Other Ambulatory Visit: Payer: Self-pay

## 2021-04-07 ENCOUNTER — Ambulatory Visit: Payer: Medicaid Other | Admitting: Dermatology

## 2021-04-07 VITALS — Wt 165.0 lb

## 2021-04-07 DIAGNOSIS — L709 Acne, unspecified: Secondary | ICD-10-CM

## 2021-04-07 DIAGNOSIS — L732 Hidradenitis suppurativa: Secondary | ICD-10-CM

## 2021-04-07 MED ORDER — ISOTRETINOIN 30 MG PO CAPS: 30.0000 mg | ORAL_CAPSULE | Freq: Every day | ORAL | 0 refills | Status: DC

## 2021-04-07 NOTE — Patient Instructions (Signed)

## 2021-04-07 NOTE — Progress Notes (Signed)
   Follow-Up Visit   Subjective  Caleb Brooks is a 60 y.o. male who presents for the following: Follow-up (1 month follow up on hidradenitis suppurativa at groin and buttocks. Patient is currently using isotretinion 20 mg and gabapentin ).   Isotretinoin F/U - 04/07/21 1200       Isotretinoin Follow Up   iPledge # [phone]    Date 04/07/21    Weight 165 lb (74.8 kg)    Acne breakouts since last visit? No      Dosage   Target Dosage (mg) 14960    Current (To Date) Dosage (mg) 600    To Go Dosage (mg) 14360      Side Effects   Skin WNL    Gastrointestinal WNL    Neurological Mood Swings    Constitutional Muscle/joint aches             The following portions of the chart were reviewed this encounter and updated as appropriate:  Tobacco  Allergies  Meds  Problems  Med Hx  Surg Hx  Fam Hx     Review of Systems: No other skin or systemic complaints except as noted in HPI or Assessment and Plan.  Objective  Well appearing patient in no apparent distress; mood and affect are within normal limits.  A focused examination was performed including groin, buttocks, face. Relevant physical exam findings are noted in the Assessment and Plan.  groin and buttocks Fistulas and plaques with drainage of groin, buttocks area  Assessment & Plan  Hidradenitis suppurativa and Acne groin and buttocks  Hidradenitis Suppurativa is a chronic; persistent; non-curable, but treatable condition due to abnormal inflamed sweat glands in the body folds (axilla, inframammary, groin, medial thighs), causing recurrent painful draining cysts and scarring. It can be associated with severe scarring acne and cysts; also abscesses and scarring of scalp. The goal is control and prevention of flares, as it is not curable. Scars are permanent and can be thickened. Treatment may include daily use of topical medication and oral antibiotics.  Oral isotretinoin may also be helpful.  For more severe cases,  Humira (a biologic injection) may be prescribed to decrease the inflammatory process and prevent flares.  When indicated, inflamed cysts may also be treated surgically.  Patient reports some moodiness. Advised patient if moodiness and arthritis becomes worse will consider discontinuing or lowering dose on isotretinoin.  If he still has problems we can always consider Humira as another option  Increase Isotretinion 20 mg to 30 mg by mouth daily.  Start Isotretinion 30 mg by mouth daily. IPledge # [phone] Patient confirmed in iPledge and isotretinoin sent to [facility].  While taking isotretinoin, do not share pills and do not donate blood. Generic isotretinoin is best absorbed when taken with a fatty meal. Isotretinoin can make you sensitive to the sun. Daily careful sun protection including sunscreen SPF 30+ when outdoors is recommended.  ISOtretinoin (ACCUTANE) 30 MG capsule - groin and buttocks Take 1 capsule (30 mg total) by mouth daily.  Return for 1 month follow up hs with acne . IRuthell Rummage, CMA, am acting as scribe for [name], MD. Documentation: I have reviewed the above documentation for accuracy and completeness, and I agree with the above.  [name], MD

## 2021-05-12 ENCOUNTER — Ambulatory Visit: Payer: Medicaid Other | Admitting: Dermatology

## 2021-05-19 ENCOUNTER — Ambulatory Visit: Payer: Medicaid Other | Admitting: Dermatology

## 2021-05-27 ENCOUNTER — Other Ambulatory Visit: Payer: Self-pay | Admitting: Nurse Practitioner

## 2021-05-27 DIAGNOSIS — I10 Essential (primary) hypertension: Secondary | ICD-10-CM

## 2021-05-27 NOTE — Telephone Encounter (Signed)
Requested Prescriptions  Pending Prescriptions Disp Refills   amLODipine (NORVASC) 5 MG tablet [Pharmacy Med Name: AMLODIPINE BESYLATE 5 MG TAB] 90 tablet 1    Sig: TAKE 1 TABLET BY MOUTH EVERY DAY     Cardiovascular:  Calcium Channel Blockers Passed - 05/27/2021  1:19 PM      Passed - Last BP in normal range    BP Readings from Last 1 Encounters:  03/21/21 112/80         Passed - Valid encounter within last 6 months    Recent Outpatient Visits          2 months ago Essential hypertension   [facility] Jon Billings, NP   2 months ago Annual physical exam   [place], NP   5 months ago Primary hypertension   [facility] Jon Billings, NP   6 months ago Primary hypertension   [place], NP      Future Appointments            In 2 weeks Jon Billings, NP [place], [facility]            nicotine (NICODERM CQ - DOSED IN MG/24 HOURS) 21 mg/24hr patch [Pharmacy Med Name: NICOTINE 21 MG/24HR PATCH] 28 patch 0    Sig: PLACE 1 PATCH ONTO THE SKIN DAILY.     Psychiatry:  Drug Dependence Therapy Passed - 05/27/2021  1:19 PM      Passed - Valid encounter within last 12 months    Recent Outpatient Visits          2 months ago Essential hypertension   [facility] Jon Billings, NP   2 months ago Annual physical exam   [facility] Jon Billings, NP   5 months ago Primary hypertension   [facility] Jon Billings, NP   6 months ago Primary hypertension   [facility] Jon Billings, NP      Future Appointments            In 2 weeks Jon Billings, NP [facility], [place]

## 2021-05-28 ENCOUNTER — Other Ambulatory Visit: Payer: Self-pay | Admitting: Dermatology

## 2021-05-28 DIAGNOSIS — L732 Hidradenitis suppurativa: Secondary | ICD-10-CM

## 2021-06-16 ENCOUNTER — Ambulatory Visit: Payer: Medicaid Other | Admitting: Nurse Practitioner

## 2021-06-16 NOTE — Progress Notes (Deleted)
There were no vitals taken for this visit.   Subjective:    Patient ID: Caleb Brooks, male    DOB: 15-Jul-1960, 61 y.o.   MRN: [id number]  HPI: Caleb Brooks is a 61 y.o. male  No chief complaint on file.  HYPERTENSION Hypertension status: {Blank single:19197::"controlled","uncontrolled","better","worse","exacerbated","stable"}  Satisfied with current treatment? {Blank single:19197::"yes","no"} Duration of hypertension: {Blank single:19197::"chronic","months","years"} BP monitoring frequency:  {Blank single:19197::"not checking","rarely","daily","weekly","monthly","a few times a day","a few times a week","a few times a month"} BP range:  BP medication side effects:  {Blank single:19197::"yes","no"} Medication compliance: {Blank single:19197::"excellent compliance","good compliance","fair compliance","poor compliance"} Previous BP meds:{Blank ALPFXTKW:40973::"ZHGD","JMEQASTMHD","QQIWLNLGXQ/JJHERDEYCX","KGYJEHUD","JSHFWYOVZC","HYIFOYDXAJ/OINO","MVEHMCNOBS (bystolic)","carvedilol","chlorthalidone","clonidine","diltiazem","exforge HCT","HCTZ","irbesartan (avapro)","labetalol","lisinopril","lisinopril-HCTZ","losartan (cozaar)","methyldopa","nifedipine","olmesartan (benicar)","olmesartan-HCTZ","quinapril","ramipril","spironalactone","tekturna","valsartan","valsartan-HCTZ","verapamil"} Aspirin: {Blank single:19197::"yes","no"} Recurrent headaches: {Blank single:19197::"yes","no"} Visual changes: {Blank single:19197::"yes","no"} Palpitations: {Blank single:19197::"yes","no"} Dyspnea: {Blank single:19197::"yes","no"} Chest pain: {Blank single:19197::"yes","no"} Lower extremity edema: {Blank single:19197::"yes","no"} Dizzy/lightheaded: {Blank single:19197::"yes","no"}  Relevant past medical, surgical, family and social history reviewed and updated as indicated. Interim medical history since our last visit reviewed. Allergies and medications reviewed and updated.  Review of Systems  Per HPI  unless specifically indicated above     Objective:    There were no vitals taken for this visit.  Wt Readings from Last 3 Encounters:  04/07/21 165 lb (74.8 kg)  03/21/21 165 lb (74.8 kg)  03/13/21 161 lb 12.8 oz (73.4 kg)    Physical Exam  Results for orders placed or performed in visit on 03/10/21  Microscopic Examination   Urine  Result Value Ref Range   WBC, UA None seen 0 - 5 /hpf   RBC 0-2 0 - 2 /hpf   Epithelial Cells (non renal) 0-10 0 - 10 /hpf   Bacteria, UA None seen None seen/Few  TSH  Result Value Ref Range   TSH 1.370 0.450 - 4.500 uIU/mL  PSA  Result Value Ref Range   Prostate Specific Ag, Serum 0.7 0.0 - 4.0 ng/mL  Lipid panel  Result Value Ref Range   Cholesterol, Total 137 100 - 199 mg/dL   Triglycerides 44 0 - 149 mg/dL   HDL 65 >39 mg/dL   VLDL Cholesterol Cal 10 5 - 40 mg/dL   LDL Chol Calc (NIH) 62 0 - 99 mg/dL   Chol/HDL Ratio 2.1 0.0 - 5.0 ratio  CBC with Differential/Platelet  Result Value Ref Range   WBC 10.0 3.4 - 10.8 x10E3/uL   RBC 4.81 4.14 - 5.80 x10E6/uL   Hemoglobin 14.2 13.0 - 17.7 g/dL   Hematocrit 43.1 37.5 - 51.0 %   MCV 90 79 - 97 fL   MCH 29.5 26.6 - 33.0 pg   MCHC 32.9 31.5 - 35.7 g/dL   RDW 13.4 11.6 - 15.4 %   Platelets 368 150 - 450 x10E3/uL   Neutrophils 73 Not Estab. %   Lymphs 17 Not Estab. %   Monocytes 9 Not Estab. %   Eos 1 Not Estab. %   Basos 0 Not Estab. %   Neutrophils Absolute 7.2 (H) 1.4 - 7.0 x10E3/uL   Lymphocytes Absolute 1.7 0.7 - 3.1 x10E3/uL   Monocytes Absolute 0.9 0.1 - 0.9 x10E3/uL   EOS (ABSOLUTE) 0.1 0.0 - 0.4 x10E3/uL   Basophils Absolute 0.0 0.0 - 0.2 x10E3/uL   Immature Granulocytes 0 Not Estab. %   Immature Grans (Abs) 0.0 0.0 - 0.1 x10E3/uL  Comprehensive metabolic panel  Result Value Ref Range   Glucose 92 70 - 99 mg/dL   BUN 12 6 - 24 mg/dL   Creatinine, Ser 0.92 0.76 - 1.27 mg/dL   eGFR 96 >  59 mL/min/1.73   BUN/Creatinine Ratio 13 9 - 20   Sodium 141 134 - 144 mmol/L   Potassium  4.3 3.5 - 5.2 mmol/L   Chloride 103 96 - 106 mmol/L   CO2 22 20 - 29 mmol/L   Calcium 9.3 8.7 - 10.2 mg/dL   Total Protein 7.7 6.0 - 8.5 g/dL   Albumin 3.8 3.8 - 4.9 g/dL   Globulin, Total 3.9 1.5 - 4.5 g/dL   Albumin/Globulin Ratio 1.0 (L) 1.2 - 2.2   Bilirubin Total 0.2 0.0 - 1.2 mg/dL   Alkaline Phosphatase 156 (H) 44 - 121 IU/L   AST 14 0 - 40 IU/L   ALT 8 0 - 44 IU/L  Urinalysis, Routine w reflex microscopic  Result Value Ref Range   Specific Gravity, UA 1.030 1.005 - 1.030   pH, UA 5.5 5.0 - 7.5   Color, UA Yellow Yellow   Appearance Ur Hazy (A) Clear   Leukocytes,UA Negative Negative   Protein,UA Trace (A) Negative/Trace   Glucose, UA Negative Negative   Ketones, UA Negative Negative   RBC, UA Negative Negative   Bilirubin, UA Negative Negative   Urobilinogen, Ur 0.2 0.2 - 1.0 mg/dL   Nitrite, UA Negative Negative   Microscopic Examination See below:       Assessment & Plan:   Problem List Items Addressed This Visit       Cardiovascular and Mediastinum   Essential hypertension - Primary     Follow up plan: No follow-ups on file.

## 2021-06-23 ENCOUNTER — Telehealth (INDEPENDENT_AMBULATORY_CARE_PROVIDER_SITE_OTHER): Payer: Medicaid Other | Admitting: Nurse Practitioner

## 2021-06-23 ENCOUNTER — Encounter: Payer: Self-pay | Admitting: Nurse Practitioner

## 2021-06-23 DIAGNOSIS — L732 Hidradenitis suppurativa: Secondary | ICD-10-CM | POA: Diagnosis not present

## 2021-06-23 DIAGNOSIS — I10 Essential (primary) hypertension: Secondary | ICD-10-CM

## 2021-06-23 MED ORDER — ADULT MULTIVITAMIN W/MINERALS CH: 1.0000 | ORAL_TABLET | Freq: Every day | ORAL | 3 refills | Status: DC

## 2021-06-23 NOTE — Assessment & Plan Note (Signed)
Chronic. Not able to check blood pressure at home.  Has an in office appointment in 2 weeks.

## 2021-06-23 NOTE — Assessment & Plan Note (Signed)
Discussed with patient that I am not able to refill his Accutane. He will need to follow up with Dermatology for refills and management.

## 2021-06-23 NOTE — Progress Notes (Signed)
There were no vitals taken for this visit.   Subjective:    Patient ID: Caleb Brooks, male    DOB: Sep 09, 1960, 61 y.o.   MRN: [id number]  HPI: Caleb Brooks is a 61 y.o. male  Chief Complaint  Patient presents with   Boils   BOILS    Relevant past medical, surgical, family and social history reviewed and updated as indicated. Interim medical history since our last visit reviewed. Allergies and medications reviewed and updated.  Review of Systems  Skin:        Hydradenitis    Per HPI unless specifically indicated above     Objective:    There were no vitals taken for this visit.  Wt Readings from Last 3 Encounters:  04/07/21 165 lb (74.8 kg)  03/21/21 165 lb (74.8 kg)  03/13/21 161 lb 12.8 oz (73.4 kg)    Physical Exam Vitals and nursing note reviewed.  HENT:     Right Ear: Hearing normal.     Left Ear: Hearing normal.  Pulmonary:     Effort: Pulmonary effort is normal. No respiratory distress.  Neurological:     Mental Status: He is alert.  Psychiatric:        Mood and Affect: Mood normal.        Behavior: Behavior normal.        Thought Content: Thought content normal.        Judgment: Judgment normal.    Results for orders placed or performed in visit on 03/10/21  Microscopic Examination   Urine  Result Value Ref Range   WBC, UA None seen 0 - 5 /hpf   RBC 0-2 0 - 2 /hpf   Epithelial Cells (non renal) 0-10 0 - 10 /hpf   Bacteria, UA None seen None seen/Few  TSH  Result Value Ref Range   TSH 1.370 0.450 - 4.500 uIU/mL  PSA  Result Value Ref Range   Prostate Specific Ag, Serum 0.7 0.0 - 4.0 ng/mL  Lipid panel  Result Value Ref Range   Cholesterol, Total 137 100 - 199 mg/dL   Triglycerides 44 0 - 149 mg/dL   HDL 65 >39 mg/dL   VLDL Cholesterol Cal 10 5 - 40 mg/dL   LDL Chol Calc (NIH) 62 0 - 99 mg/dL   Chol/HDL Ratio 2.1 0.0 - 5.0 ratio  CBC with Differential/Platelet  Result Value Ref Range   WBC 10.0 3.4 - 10.8 x10E3/uL   RBC 4.81 4.14 -  5.80 x10E6/uL   Hemoglobin 14.2 13.0 - 17.7 g/dL   Hematocrit 43.1 37.5 - 51.0 %   MCV 90 79 - 97 fL   MCH 29.5 26.6 - 33.0 pg   MCHC 32.9 31.5 - 35.7 g/dL   RDW 13.4 11.6 - 15.4 %   Platelets 368 150 - 450 x10E3/uL   Neutrophils 73 Not Estab. %   Lymphs 17 Not Estab. %   Monocytes 9 Not Estab. %   Eos 1 Not Estab. %   Basos 0 Not Estab. %   Neutrophils Absolute 7.2 (H) 1.4 - 7.0 x10E3/uL   Lymphocytes Absolute 1.7 0.7 - 3.1 x10E3/uL   Monocytes Absolute 0.9 0.1 - 0.9 x10E3/uL   EOS (ABSOLUTE) 0.1 0.0 - 0.4 x10E3/uL   Basophils Absolute 0.0 0.0 - 0.2 x10E3/uL   Immature Granulocytes 0 Not Estab. %   Immature Grans (Abs) 0.0 0.0 - 0.1 x10E3/uL  Comprehensive metabolic panel  Result Value Ref Range   Glucose 92 70 -  99 mg/dL   BUN 12 6 - 24 mg/dL   Creatinine, Ser 0.92 0.76 - 1.27 mg/dL   eGFR 96 >59 mL/min/1.73   BUN/Creatinine Ratio 13 9 - 20   Sodium 141 134 - 144 mmol/L   Potassium 4.3 3.5 - 5.2 mmol/L   Chloride 103 96 - 106 mmol/L   CO2 22 20 - 29 mmol/L   Calcium 9.3 8.7 - 10.2 mg/dL   Total Protein 7.7 6.0 - 8.5 g/dL   Albumin 3.8 3.8 - 4.9 g/dL   Globulin, Total 3.9 1.5 - 4.5 g/dL   Albumin/Globulin Ratio 1.0 (L) 1.2 - 2.2   Bilirubin Total 0.2 0.0 - 1.2 mg/dL   Alkaline Phosphatase 156 (H) 44 - 121 IU/L   AST 14 0 - 40 IU/L   ALT 8 0 - 44 IU/L  Urinalysis, Routine w reflex microscopic  Result Value Ref Range   Specific Gravity, UA 1.030 1.005 - 1.030   pH, UA 5.5 5.0 - 7.5   Color, UA Yellow Yellow   Appearance Ur Hazy (A) Clear   Leukocytes,UA Negative Negative   Protein,UA Trace (A) Negative/Trace   Glucose, UA Negative Negative   Ketones, UA Negative Negative   RBC, UA Negative Negative   Bilirubin, UA Negative Negative   Urobilinogen, Ur 0.2 0.2 - 1.0 mg/dL   Nitrite, UA Negative Negative   Microscopic Examination See below:       Assessment & Plan:   Problem List Items Addressed This Visit       Cardiovascular and Mediastinum   Essential  hypertension    Chronic. Not able to check blood pressure at home.  Has an in office appointment in 2 weeks.      Relevant Medications   Multiple Vitamin (MULTIVITAMIN WITH MINERALS) TABS tablet     Musculoskeletal and Integument   Hidradenitis suppurativa    Discussed with patient that I am not able to refill his Accutane. He will need to follow up with Dermatology for refills and management.        Follow up plan: Return in about 2 weeks (around 07/07/2021) for BP Check.   This visit was completed via MyChart due to the restrictions of the COVID-19 pandemic. All issues as above were discussed and addressed. Physical exam was done as above through visual confirmation on MyChart. If it was felt that the patient should be evaluated in the office, they were directed there. The patient verbally consented to this visit. Location of the patient: Home Location of the provider: Office Those involved with this call:  Provider: Jon Billings, NP CMA: Yvonna Alanis, [place] Desk/Registration: Myrlene Broker This encounter was conducted via phone.  I spent 15 dedicated to the care of this patient on the date of this encounter to include previsit review of 21, phone time with the patient, and post visit ordering of testing.

## 2021-07-07 ENCOUNTER — Ambulatory Visit: Payer: Medicaid Other | Admitting: Nurse Practitioner

## 2021-07-07 ENCOUNTER — Other Ambulatory Visit: Payer: Self-pay

## 2021-07-07 ENCOUNTER — Encounter: Payer: Self-pay | Admitting: Nurse Practitioner

## 2021-07-07 VITALS — BP 124/73 | HR 74 | Temp 98.1°F | Ht 70.98 in | Wt 162.2 lb

## 2021-07-07 DIAGNOSIS — I10 Essential (primary) hypertension: Secondary | ICD-10-CM

## 2021-07-07 DIAGNOSIS — Z23 Encounter for immunization: Secondary | ICD-10-CM

## 2021-07-07 NOTE — Assessment & Plan Note (Signed)
Chronic.  Controlled.  Continue with current medication regimen on Amlodipine 5mg .  Labs ordered today.  Return to clinic in 6 months for reevaluation.  Call sooner if concerns arise.

## 2021-07-07 NOTE — Progress Notes (Signed)
BP 124/73    Pulse 74    Temp 98.1 F (36.7 C) (Oral)    Ht 5' 10.98" (1.803 m)    Wt 162 lb 3.2 oz (73.6 kg)    SpO2 96%    BMI 22.63 kg/m    Subjective:    Patient ID: Caleb Brooks, male    DOB: 06/06/1961, 61 y.o.   MRN: [id number]  HPI: Caleb Brooks is a 61 y.o. male  Chief Complaint  Patient presents with   Hypertension   HYPERTENSION Hypertension status: controlled  Satisfied with current treatment? no Duration of hypertension: years BP monitoring frequency:  not checking BP range:  BP medication side effects:  no Medication compliance: excellent compliance Previous BP meds: amlodipine Aspirin: no Recurrent headaches: no Visual changes: no Palpitations: no Dyspnea: no Chest pain: no Lower extremity edema: no Dizzy/lightheaded: no  Patient has not seen Dr. Nehemiah Massed recently.  He has run out of the Accutane and has not been able to get a refill.    Relevant past medical, surgical, family and social history reviewed and updated as indicated. Interim medical history since our last visit reviewed. Allergies and medications reviewed and updated.  Review of Systems  Eyes:  Negative for visual disturbance.  Respiratory:  Negative for shortness of breath.   Cardiovascular:  Negative for chest pain and leg swelling.  Neurological:  Negative for light-headedness and headaches.   Per HPI unless specifically indicated above     Objective:    BP 124/73    Pulse 74    Temp 98.1 F (36.7 C) (Oral)    Ht 5' 10.98" (1.803 m)    Wt 162 lb 3.2 oz (73.6 kg)    SpO2 96%    BMI 22.63 kg/m   Wt Readings from Last 3 Encounters:  07/07/21 162 lb 3.2 oz (73.6 kg)  04/07/21 165 lb (74.8 kg)  03/21/21 165 lb (74.8 kg)    Physical Exam Vitals and nursing note reviewed.  Constitutional:      General: He is not in acute distress.    Appearance: Normal appearance. He is not ill-appearing, toxic-appearing or diaphoretic.  HENT:     Head: Normocephalic.     Right Ear: External  ear normal.     Left Ear: External ear normal.     Nose: Nose normal. No congestion or rhinorrhea.     Mouth/Throat:     Mouth: Mucous membranes are moist.  Eyes:     General:        Right eye: No discharge.        Left eye: No discharge.     Extraocular Movements: Extraocular movements intact.     Conjunctiva/sclera: Conjunctivae normal.     Pupils: Pupils are equal, round, and reactive to light.  Cardiovascular:     Rate and Rhythm: Normal rate and regular rhythm.     Heart sounds: No murmur heard. Pulmonary:     Effort: Pulmonary effort is normal. No respiratory distress.     Breath sounds: Normal breath sounds. No wheezing, rhonchi or rales.  Abdominal:     General: Abdomen is flat. Bowel sounds are normal.  Musculoskeletal:     Cervical back: Normal range of motion and neck supple.  Skin:    General: Skin is warm and dry.     Capillary Refill: Capillary refill takes less than 2 seconds.  Neurological:     General: No focal deficit present.     Mental Status: He  is alert and oriented to person, place, and time.  Psychiatric:        Mood and Affect: Mood normal.        Behavior: Behavior normal.        Thought Content: Thought content normal.        Judgment: Judgment normal.    Results for orders placed or performed in visit on 03/10/21  Microscopic Examination   Urine  Result Value Ref Range   WBC, UA None seen 0 - 5 /hpf   RBC 0-2 0 - 2 /hpf   Epithelial Cells (non renal) 0-10 0 - 10 /hpf   Bacteria, UA None seen None seen/Few  TSH  Result Value Ref Range   TSH 1.370 0.450 - 4.500 uIU/mL  PSA  Result Value Ref Range   Prostate Specific Ag, Serum 0.7 0.0 - 4.0 ng/mL  Lipid panel  Result Value Ref Range   Cholesterol, Total 137 100 - 199 mg/dL   Triglycerides 44 0 - 149 mg/dL   HDL 65 >39 mg/dL   VLDL Cholesterol Cal 10 5 - 40 mg/dL   LDL Chol Calc (NIH) 62 0 - 99 mg/dL   Chol/HDL Ratio 2.1 0.0 - 5.0 ratio  CBC with Differential/Platelet  Result Value Ref  Range   WBC 10.0 3.4 - 10.8 x10E3/uL   RBC 4.81 4.14 - 5.80 x10E6/uL   Hemoglobin 14.2 13.0 - 17.7 g/dL   Hematocrit 43.1 37.5 - 51.0 %   MCV 90 79 - 97 fL   MCH 29.5 26.6 - 33.0 pg   MCHC 32.9 31.5 - 35.7 g/dL   RDW 13.4 11.6 - 15.4 %   Platelets 368 150 - 450 x10E3/uL   Neutrophils 73 Not Estab. %   Lymphs 17 Not Estab. %   Monocytes 9 Not Estab. %   Eos 1 Not Estab. %   Basos 0 Not Estab. %   Neutrophils Absolute 7.2 (H) 1.4 - 7.0 x10E3/uL   Lymphocytes Absolute 1.7 0.7 - 3.1 x10E3/uL   Monocytes Absolute 0.9 0.1 - 0.9 x10E3/uL   EOS (ABSOLUTE) 0.1 0.0 - 0.4 x10E3/uL   Basophils Absolute 0.0 0.0 - 0.2 x10E3/uL   Immature Granulocytes 0 Not Estab. %   Immature Grans (Abs) 0.0 0.0 - 0.1 x10E3/uL  Comprehensive metabolic panel  Result Value Ref Range   Glucose 92 70 - 99 mg/dL   BUN 12 6 - 24 mg/dL   Creatinine, Ser 0.92 0.76 - 1.27 mg/dL   eGFR 96 >59 mL/min/1.73   BUN/Creatinine Ratio 13 9 - 20   Sodium 141 134 - 144 mmol/L   Potassium 4.3 3.5 - 5.2 mmol/L   Chloride 103 96 - 106 mmol/L   CO2 22 20 - 29 mmol/L   Calcium 9.3 8.7 - 10.2 mg/dL   Total Protein 7.7 6.0 - 8.5 g/dL   Albumin 3.8 3.8 - 4.9 g/dL   Globulin, Total 3.9 1.5 - 4.5 g/dL   Albumin/Globulin Ratio 1.0 (L) 1.2 - 2.2   Bilirubin Total 0.2 0.0 - 1.2 mg/dL   Alkaline Phosphatase 156 (H) 44 - 121 IU/L   AST 14 0 - 40 IU/L   ALT 8 0 - 44 IU/L  Urinalysis, Routine w reflex microscopic  Result Value Ref Range   Specific Gravity, UA 1.030 1.005 - 1.030   pH, UA 5.5 5.0 - 7.5   Color, UA Yellow Yellow   Appearance Ur Hazy (A) Clear   Leukocytes,UA Negative Negative   Protein,UA Trace (A)  Negative/Trace   Glucose, UA Negative Negative   Ketones, UA Negative Negative   RBC, UA Negative Negative   Bilirubin, UA Negative Negative   Urobilinogen, Ur 0.2 0.2 - 1.0 mg/dL   Nitrite, UA Negative Negative   Microscopic Examination See below:       Assessment & Plan:   Problem List Items Addressed This Visit        Cardiovascular and Mediastinum   Essential hypertension - Primary    Chronic.  Controlled.  Continue with current medication regimen on Amlodipine 80m.  Labs ordered today.  Return to clinic in 6 months for reevaluation.  Call sooner if concerns arise.        Relevant Orders   Comp Met (CMET)   Other Visit Diagnoses     Need for influenza vaccination       Relevant Orders   Flu Vaccine QUAD 647moM (Fluarix, Fluzone & Alfiuria Quad PF) (Completed)        Follow up plan: Return in about 6 months (around 01/04/2022) for HTN, HLD, DM2 FU.

## 2021-07-08 LAB — COMPREHENSIVE METABOLIC PANEL
ALT: 10 IU/L (ref 0–44)
AST: 17 IU/L (ref 0–40)
Albumin/Globulin Ratio: 0.9 — ABNORMAL LOW (ref 1.2–2.2)
Albumin: 3.8 g/dL (ref 3.8–4.9)
Alkaline Phosphatase: 183 IU/L — ABNORMAL HIGH (ref 44–121)
BUN/Creatinine Ratio: 14 (ref 9–20)
BUN: 13 mg/dL (ref 6–24)
Bilirubin Total: 0.2 mg/dL (ref 0.0–1.2)
CO2: 23 mmol/L (ref 20–29)
Calcium: 9.1 mg/dL (ref 8.7–10.2)
Chloride: 104 mmol/L (ref 96–106)
Creatinine, Ser: 0.95 mg/dL (ref 0.76–1.27)
Globulin, Total: 4.3 g/dL (ref 1.5–4.5)
Glucose: 92 mg/dL (ref 70–99)
Potassium: 4.3 mmol/L (ref 3.5–5.2)
Sodium: 140 mmol/L (ref 134–144)
Total Protein: 8.1 g/dL (ref 6.0–8.5)
eGFR: 92 mL/min/{1.73_m2} (ref >59)

## 2021-07-08 NOTE — Progress Notes (Signed)
Hi Mr. Stogdill. It was good to see you yesterday.  Your lab work looks good.  No concerns at this time.  Follow up as discussed.

## 2021-08-04 ENCOUNTER — Other Ambulatory Visit: Payer: Self-pay

## 2021-08-04 ENCOUNTER — Encounter: Payer: Self-pay | Admitting: Nurse Practitioner

## 2021-08-04 ENCOUNTER — Ambulatory Visit (INDEPENDENT_AMBULATORY_CARE_PROVIDER_SITE_OTHER): Payer: Medicaid Other | Admitting: Nurse Practitioner

## 2021-08-04 VITALS — BP 105/71 | HR 82 | Temp 98.6°F | Wt 160.0 lb

## 2021-08-04 DIAGNOSIS — R051 Acute cough: Secondary | ICD-10-CM

## 2021-08-04 DIAGNOSIS — Z113 Encounter for screening for infections with a predominantly sexual mode of transmission: Secondary | ICD-10-CM

## 2021-08-04 DIAGNOSIS — J301 Allergic rhinitis due to pollen: Secondary | ICD-10-CM

## 2021-08-04 LAB — VERITOR FLU A/B WAIVED
Influenza A: NEGATIVE
Influenza B: NEGATIVE

## 2021-08-04 MED ORDER — CETIRIZINE HCL 10 MG PO TABS: 10.0000 mg | ORAL_TABLET | Freq: Every day | ORAL | 11 refills | Status: DC

## 2021-08-04 NOTE — Progress Notes (Signed)
Hi Caleb Brooks.  You Flu results were negative.

## 2021-08-04 NOTE — Progress Notes (Signed)
BP 105/71    Pulse 82    Temp 98.6 F (37 C) (Oral)    Wt 160 lb (72.6 kg)    SpO2 98%    BMI 22.33 kg/m    Subjective:    Patient ID: Caleb Brooks, male    DOB: 1961/03/19, 61 y.o.   MRN: [id number]  HPI: Caleb Brooks is a 61 y.o. male  Chief Complaint  Patient presents with   URI    Pt states he has been having congestion, runny nose, headache and a cough for the past 3 days.    Labs Only    Pt states he would like to be tested for trichomonas. States he thinks he was exposed last week.    UPPER RESPIRATORY TRACT INFECTION Worst symptom: started 3 days ago Fever: no Cough: yes Shortness of breath: no Wheezing: no Chest pain: no Chest tightness: no Chest congestion: no Nasal congestion: yes Runny nose: yes Post nasal drip: no Sneezing: yes Sore throat: yes Swollen glands: no Sinus pressure: no Headache: no Face pain: no Toothache: no Ear pain: no bilateral Ear pressure: no bilateral Eyes red/itching:no Eye drainage/crusting: no  Vomiting: no Rash: no Fatigue: yes Sick contacts: no Strep contacts: no  Context: stable Recurrent sinusitis: no Relief with OTC cold/cough medications: no  Treatments attempted: none   STD SCREENING Sexual activity:  Recent unprotected sexual encounter Contraception: no Recent unprotected intercourse: yes History of sexually transmitted diseases: no Genital lesions: no Penile discharge: no Dysuria: no Swollen lymph nodes: no Fevers: no Rash: no  Relevant past medical, surgical, family and social history reviewed and updated as indicated. Interim medical history since our last visit reviewed. Allergies and medications reviewed and updated.  Review of Systems  Constitutional:  Positive for fatigue. Negative for fever.  HENT:  Positive for congestion, rhinorrhea and sore throat. Negative for ear pain, postnasal drip, sinus pressure, sinus pain and sneezing.   Respiratory:  Negative for cough, chest tightness, shortness of  breath and wheezing.   Gastrointestinal:  Negative for vomiting.  Genitourinary:  Negative for penile discharge and penile pain.  Skin:  Negative for rash.  Neurological:  Negative for headaches.   Per HPI unless specifically indicated above     Objective:    BP 105/71    Pulse 82    Temp 98.6 F (37 C) (Oral)    Wt 160 lb (72.6 kg)    SpO2 98%    BMI 22.33 kg/m   Wt Readings from Last 3 Encounters:  08/04/21 160 lb (72.6 kg)  07/07/21 162 lb 3.2 oz (73.6 kg)  04/07/21 165 lb (74.8 kg)    Physical Exam Vitals and nursing note reviewed.  Constitutional:      General: He is not in acute distress.    Appearance: Normal appearance. He is not ill-appearing, toxic-appearing or diaphoretic.  HENT:     Head: Normocephalic.     Right Ear: Tympanic membrane normal. There is mastoid tenderness.     Left Ear: Tympanic membrane normal. There is mastoid tenderness.     Nose: Congestion and rhinorrhea present.     Mouth/Throat:     Mouth: Mucous membranes are moist.     Pharynx: Posterior oropharyngeal erythema present. No oropharyngeal exudate.  Eyes:     General:        Right eye: No discharge.        Left eye: No discharge.     Extraocular Movements: Extraocular movements intact.  Conjunctiva/sclera: Conjunctivae normal.     Pupils: Pupils are equal, round, and reactive to light.  Cardiovascular:     Rate and Rhythm: Normal rate and regular rhythm.     Heart sounds: No murmur heard. Pulmonary:     Effort: Pulmonary effort is normal. No respiratory distress.     Breath sounds: Normal breath sounds. No wheezing, rhonchi or rales.  Abdominal:     General: Abdomen is flat. Bowel sounds are normal.  Musculoskeletal:     Cervical back: Normal range of motion and neck supple.  Skin:    General: Skin is warm and dry.     Capillary Refill: Capillary refill takes less than 2 seconds.  Neurological:     General: No focal deficit present.     Mental Status: He is alert and oriented  to person, place, and time.  Psychiatric:        Mood and Affect: Mood normal.        Behavior: Behavior normal.        Thought Content: Thought content normal.        Judgment: Judgment normal.    Results for orders placed or performed in visit on 07/07/21  Comp Met (CMET)  Result Value Ref Range   Glucose 92 70 - 99 mg/dL   BUN 13 6 - 24 mg/dL   Creatinine, Ser 0.95 0.76 - 1.27 mg/dL   eGFR 92 >59 mL/min/1.73   BUN/Creatinine Ratio 14 9 - 20   Sodium 140 134 - 144 mmol/L   Potassium 4.3 3.5 - 5.2 mmol/L   Chloride 104 96 - 106 mmol/L   CO2 23 20 - 29 mmol/L   Calcium 9.1 8.7 - 10.2 mg/dL   Total Protein 8.1 6.0 - 8.5 g/dL   Albumin 3.8 3.8 - 4.9 g/dL   Globulin, Total 4.3 1.5 - 4.5 g/dL   Albumin/Globulin Ratio 0.9 (L) 1.2 - 2.2   Bilirubin Total 0.2 0.0 - 1.2 mg/dL   Alkaline Phosphatase 183 (H) 44 - 121 IU/L   AST 17 0 - 40 IU/L   ALT 10 0 - 44 IU/L      Assessment & Plan:   Problem List Items Addressed This Visit   None Visit Diagnoses     Seasonal allergic rhinitis due to pollen    -  Primary   Recommend using Zyrtec daily during allergy season to help with symptoms. Follow up if symptoms worsen or fail to improve.    Routine screening for STI (sexually transmitted infection)       Patient was exposed to Trichomonas. Labs ordered today. Will make recommendations based on lab results.    Relevant Orders   Chlamydia/Gonococcus/Trichomonas, NAA([facility])   Acute cough       Relevant Orders   Veritor Flu A/B Waived   Novel Coronavirus, NAA ([facility])        Follow up plan: Return if symptoms worsen or fail to improve.

## 2021-08-06 LAB — NOVEL CORONAVIRUS, NAA: SARS-CoV-2, NAA: NOT DETECTED

## 2021-08-07 LAB — CHLAMYDIA/GONOCOCCUS/TRICHOMONAS, NAA
Chlamydia by NAA: NEGATIVE
Gonococcus by NAA: NEGATIVE
Trich vag by NAA: NEGATIVE

## 2021-08-07 NOTE — Progress Notes (Signed)
Please let patient know that his lab results are negative.  No evidence of Flu or COVID.  No evidence of STI.

## 2021-08-08 ENCOUNTER — Telehealth: Payer: Self-pay | Admitting: Nurse Practitioner

## 2021-08-08 NOTE — Telephone Encounter (Signed)
Copied from [place] [id number]. Topic: General - Inquiry ?>> Aug 07, 2021  4:44 PM Greggory Keen D wrote: ?Reason for CRM: Pt called asking about his last lab results and would like someone to call him back [phone] ?

## 2021-08-08 NOTE — Telephone Encounter (Signed)
Spoke with patient and notified him of his recent lab results. Patient verbalized understanding and has no further questions at this time.  ?

## 2021-08-22 ENCOUNTER — Other Ambulatory Visit: Payer: Self-pay | Admitting: Nurse Practitioner

## 2021-08-22 NOTE — Telephone Encounter (Signed)
Requested Prescriptions  ?Pending Prescriptions Disp Refills  ?? nicotine (NICODERM CQ - DOSED IN MG/24 HOURS) 21 mg/24hr patch [Pharmacy Med Name: NICOTINE 21 MG/24HR PATCH] 28 patch 0  ?  Sig: APPLY 1 PATCH ONTO THE SKIN EVERY DAY  ?  ? Psychiatry:  Drug Dependence Therapy Passed - 08/22/2021  1:47 PM  ?  ?  Passed - Valid encounter within last 12 months  ?  Recent Outpatient Visits   ?      ? 2 weeks ago Seasonal allergic rhinitis due to pollen  ? [place], NP  ? 1 month ago Essential hypertension  ? [place], NP  ? 2 months ago Hidradenitis suppurativa  ? [place], NP  ? 5 months ago Essential hypertension  ? [place], NP  ? 5 months ago Annual physical exam  ? [facility] Jon Billings, NP  ?  ?  ?Future Appointments   ?        ? In 2 weeks Ralene Bathe, MD [place]  ? In 4 months Jon Billings, NP [facility], [facility]  ?  ? ?  ?  ?  ? ?

## 2021-08-26 ENCOUNTER — Other Ambulatory Visit: Payer: Self-pay | Admitting: Nurse Practitioner

## 2021-08-26 DIAGNOSIS — I10 Essential (primary) hypertension: Secondary | ICD-10-CM

## 2021-08-27 ENCOUNTER — Ambulatory Visit: Payer: Self-pay | Admitting: Gerontology

## 2021-08-28 NOTE — Telephone Encounter (Signed)
Requested medication (s) are due for refill today: Yes ? ?Requested medication (s) are on the active medication list: Yes ? ?Last refill:  03/13/21; 01/2021 ? ?Future visit scheduled: Yes ? ?Notes to clinic:  Unable to refill per protocol due to diagnosis code needed, medication not assigned to protocol ? ? ? ? ?Requested Prescriptions  ?Pending Prescriptions Disp Refills  ? folic acid (FOLVITE) 1 MG tablet [Pharmacy Med Name: FOLIC ACID 1 MG TABLET] 90 tablet 1  ?  Sig: TAKE 1 TABLET BY MOUTH EVERY DAY (NOT COVERED)  ?  ? Endocrinology:  Vitamins Passed - 08/26/2021  5:51 PM  ?  ?  Passed - Valid encounter within last 12 months  ?  Recent Outpatient Visits   ? ?      ? 3 weeks ago Seasonal allergic rhinitis due to pollen  ? [place], NP  ? 1 month ago Essential hypertension  ? [place], NP  ? 2 months ago Hidradenitis suppurativa  ? [place], NP  ? 5 months ago Essential hypertension  ? [place], NP  ? 5 months ago Annual physical exam  ? [facility] Jon Billings, NP  ? ?  ?  ?Future Appointments   ? ?        ? In 1 week Ralene Bathe, MD [place]  ? In 4 months Jon Billings, NP [facility], [facility]  ? ?  ? ?  ?  ?  ? thiamine 100 MG tablet [Pharmacy Med Name: VITAMIN B-1 100 MG TABLET] 90 tablet 1  ?  Sig: TAKE 1 TABLET BY MOUTH EVERY DAY  ?  ? Off-Protocol Failed - 08/26/2021  5:51 PM  ?  ?  Failed - Medication not assigned to a protocol, review manually.  ?  ?  Passed - Valid encounter within last 12 months  ?  Recent Outpatient Visits   ? ?      ? 3 weeks ago Seasonal allergic rhinitis due to pollen  ? [place], NP  ? 1 month ago Essential hypertension  ? [place], NP  ? 2 months ago Hidradenitis suppurativa  ? [place], NP  ? 5  months ago Essential hypertension  ? [place], NP  ? 5 months ago Annual physical exam  ? [facility] Jon Billings, NP  ? ?  ?  ?Future Appointments   ? ?        ? In 1 week Ralene Bathe, MD [place]  ? In 4 months Jon Billings, NP [facility], [facility]  ? ?  ? ?  ?  ?  ?Signed Prescriptions Disp Refills  ? gabapentin (NEURONTIN) 400 MG capsule 270 capsule 1  ?  Sig: TAKE 1 CAPSULE BY MOUTH 3 TIMES DAILY.  ?  ? Neurology: Anticonvulsants - gabapentin Passed - 08/26/2021  5:51 PM  ?  ?  Passed - Cr in normal range and within 360 days  ?  Creatinine, Ser  ?Date Value Ref Range Status  ?07/07/2021 0.95 0.76 - 1.27 mg/dL Final  ?  ?  ?  ?  Passed - Completed PHQ-2 or PHQ-9 in the last 360 days  ?  ?  Passed - Valid encounter within last 12 months  ?  Recent Outpatient Visits   ? ?      ?  3 weeks ago Seasonal allergic rhinitis due to pollen  ? [place], NP  ? 1 month ago Essential hypertension  ? [place], NP  ? 2 months ago Hidradenitis suppurativa  ? [place], NP  ? 5 months ago Essential hypertension  ? [place], NP  ? 5 months ago Annual physical exam  ? [facility] Jon Billings, NP  ? ?  ?  ?Future Appointments   ? ?        ? In 1 week Ralene Bathe, MD [place]  ? In 4 months Jon Billings, NP [facility], [facility]  ? ?  ? ?  ?  ?  ? ? ? ? ?

## 2021-09-08 ENCOUNTER — Ambulatory Visit (INDEPENDENT_AMBULATORY_CARE_PROVIDER_SITE_OTHER): Payer: Medicaid Other | Admitting: Dermatology

## 2021-09-08 DIAGNOSIS — L709 Acne, unspecified: Secondary | ICD-10-CM | POA: Diagnosis not present

## 2021-09-08 DIAGNOSIS — L732 Hidradenitis suppurativa: Secondary | ICD-10-CM | POA: Diagnosis not present

## 2021-09-08 MED ORDER — ISOTRETINOIN 30 MG PO CAPS: 30.0000 mg | ORAL_CAPSULE | Freq: Every day | ORAL | 0 refills | Status: DC

## 2021-09-08 NOTE — Progress Notes (Signed)
? ?  Follow-Up Visit ?  ?Subjective  ?Caleb Brooks is a 61 y.o. male who presents for the following: Follow-up (Patient here today for hidradenitis suppurativa with acne at groin . Patient has been off isotretinion for 2 - 3 months. ). ? ?The following portions of the chart were reviewed this encounter and updated as appropriate:  Tobacco  Allergies  Meds  Problems  Med Hx  Surg Hx  Fam Hx   ?  ?Review of Systems: No other skin or systemic complaints except as noted in HPI or Assessment and Plan. ? ?Objective  ?Well appearing patient in no apparent distress; mood and affect are within normal limits. ? ?A focused examination was performed including face, neck, chest and back. Relevant physical exam findings are noted in the Assessment and Plan. ? ? ?Assessment & Plan  ?Hidradenitis suppurativa ? And Acne ?groin and buttocks ?Hidradenitis Suppurativa is a chronic; persistent; non-curable, but treatable condition due to abnormal inflamed sweat glands in the body folds (axilla, inframammary, groin, medial thighs), causing recurrent painful draining cysts and scarring. It can be associated with severe scarring acne and cysts; also abscesses and scarring of scalp. The goal is control and prevention of flares, as it is not curable. Scars are permanent and can be thickened. Treatment may include daily use of topical medication and oral antibiotics.  Oral isotretinoin may also be helpful.  For more severe cases, Humira (a biologic injection) may be prescribed to decrease the inflammatory process and prevent flares.  When indicated, inflamed cysts may also be treated surgically. ? ?Patient reports some moodiness, but not likely related to the isotretinoin since he has that anyway.Marland Kitchen ?Advised patient if moodiness and arthritis becomes worse will consider discontinuing or lowering dose on isotretinoin.  If he still has problems we can always consider Humira as another option ?  ?Patient can take Celebrex with Isotretinion  if needed, but I advised him that since I am not the initiating physician for Celebrex prescription, he will need to get the doctor who initially prescribed this for him to continue that prescription for Celebrex. ?Continue Isotretinion 30 mg by mouth daily. ?  Drema Halon # [phone] ?Patient confirmed in iPledge and isotretinoin sent [facility]. ?  ?While taking isotretinoin, do not share pills and do not donate blood. Generic isotretinoin is best absorbed when taken with a fatty meal. Isotretinoin can make you sensitive to the sun. Daily careful sun protection including sunscreen SPF 30+ when outdoors is recommended. ? ?ISOtretinoin (ACCUTANE) 30 MG capsule - groin and buttocks ?Take 1 capsule (30 mg total) by mouth daily. ? ?Return for 1 month isotretinion. ?I, Ruthell Rummage, CMA, am acting as scribe for [name], MD. ?Documentation: I have reviewed the above documentation for accuracy and completeness, and I agree with the above. ? ?[name], MD ? ?

## 2021-09-08 NOTE — Patient Instructions (Addendum)
Patient can take Celebrex medication with isotretinoin but recommend talking to primary for prescription  ? ?While taking isotretinoin, do not share pills and do not donate blood. Generic isotretinoin is best absorbed when taken with a fatty meal. Isotretinoin can make you sensitive to the sun. Daily careful sun protection including sunscreen SPF 30+ when outdoors is recommended. ? ? ?If You Need Anything After Your Visit ? ?If you have any questions or concerns for your doctor, please call our main line at [phone] and press option 4 to reach your doctor's medical assistant. If no one answers, please leave a voicemail as directed and we will return your call as soon as possible. Messages left after 4 pm will be answered the following business day.  ? ?You may also send [place] a message via MyChart. We typically respond to MyChart messages within 1-2 business days. ? ?For prescription refills, please ask your pharmacy to contact our office. Our fax number is [phone]. ? ?If you have an urgent issue when the clinic is closed that cannot wait until the next business day, you can page your doctor at the number below.   ? ?Please note that while we do our best to be available for urgent issues outside of office hours, we are not available 24/7.  ? ?If you have an urgent issue and are unable to reach [place], you may choose to seek medical care at your doctor's office, retail clinic, urgent care center, or emergency room. ? ?If you have a medical emergency, please immediately call 911 or go to the emergency department. ? ?Pager Numbers ? ?- Dr. Nehemiah Massed: [phone] ? ?- Dr. Laurence Ferrari: [phone] ? ?- Dr. Nicole Kindred: [phone] ? ?In the event of inclement weather, please call our main line at [phone] for an update on the status of any delays or closures. ? ?Dermatology Medication Tips: ?Please keep the boxes that topical medications come in in order to help keep track of the instructions about where and how to use  these. Pharmacies typically print the medication instructions only on the boxes and not directly on the medication tubes.  ? ?If your medication is too expensive, please contact our office at [phone] option 4 or send [place] a message through [place].  ? ?We are unable to tell what your co-pay for medications will be in advance as this is different depending on your insurance coverage. However, we may be able to find a substitute medication at lower cost or fill out paperwork to get insurance to cover a needed medication.  ? ?If a prior authorization is required to get your medication covered by your insurance company, please allow [place] 1-2 business days to complete this process. ? ?Drug prices often vary depending on where the prescription is filled and some pharmacies may offer cheaper prices. ? ?The website www.goodrx.com contains coupons for medications through different pharmacies. The prices here do not account for what the cost may be with help from insurance (it may be cheaper with your insurance), but the website can give you the price if you did not use any insurance.  ?- You can print the associated coupon and take it with your prescription to the pharmacy.  ?- You may also stop by our office during regular business hours and pick up a GoodRx coupon card.  ?- If you need your prescription sent electronically to a different pharmacy, notify our office through [facility] or by phone at [phone] option 4. ? ? ? ? ?Si [place]  Algo Despu?s de Su Visita ? ?Tambi?n puede enviarnos un mensaje a trav?s de MyChart. Por lo general respondemos a los mensajes de MyChart en el transcurso de 1 a 2 d?as h?biles. ? ?Para renovar recetas, por favor pida a su farmacia que se ponga en contacto con nuestra oficina. Nuestro n?mero de fax es el [phone]. ? ?Si tiene un asunto urgente cuando la cl?nica est? cerrada y que no puede esperar hasta el siguiente d?a h?bil, puede llamar/localizar a su doctor(a) al  n?mero que aparece a continuaci?n.  ? ?Por favor, tenga en cuenta que aunque hacemos todo lo posible para estar disponibles para asuntos urgentes fuera del horario de oficina, no estamos disponibles las 24 horas del d?a, los 7 d?as de la semana.  ? ?Si tiene un problema urgente y no puede comunicarse con nosotros, puede optar por buscar atenci?n m?dica  en el consultorio de su doctor(a), en una cl?nica privada, en un centro de atenci?n urgente o en una sala de emergencias. ? ?Si tiene Engineer, maintenance (IT) m?dica, por favor llame inmediatamente al 911 o vaya a la sala de emergencias. ? ?N?meros de b?per ? ?- Dr. Nehemiah Massed: [phone] ? ?- Dra. Moye: [phone] ? ?- Dra. Nicole Kindred: [phone] ? ?En caso de inclemencias del tiempo, por favor llame a nuestra l?nea principal al [phone] para una actualizaci?n sobre el estado de cualquier retraso o cierre. ? ?Consejos para la medicaci?n en dermatolog?a: ?Por favor, guarde las cajas en las que vienen los medicamentos de uso t?pico para ayudarle a seguir las instrucciones sobre d?nde y c?mo usarlos. Las farmacias generalmente imprimen las instrucciones del medicamento s?lo en las cajas y no directamente en los tubos del [place].  ? ?Si su medicamento es muy caro, por favor, p?ngase en contacto con Zigmund Daniel llamando al [phone] y presione la opci?n 4 o env?enos un mensaje a trav?s de MyChart.  ? ?No podemos decirle cu?l ser? su copago por los medicamentos por adelantado ya que esto es diferente dependiendo de la cobertura de su seguro. Sin embargo, es posible que podamos encontrar un medicamento sustituto a Electrical engineer un formulario para que el seguro cubra el medicamento que se considera necesario.  ? ?Si se requiere Ardelia Mems autorizaci?n previa para que su compa??a de seguros [place] su medicamento, por favor perm?tanos de 1 a 2 d?as h?biles para completar este proceso. ? ?Los precios de los medicamentos var?an con frecuencia dependiendo del Environmental consultant de  d?nde se surte la receta y alguna farmacias pueden ofrecer precios m?s baratos. ? ?El sitio web www.goodrx.com tiene cupones para medicamentos de Airline pilot. Los precios aqu? no tienen en cuenta lo que podr?a costar con la ayuda del seguro (puede ser m?s barato con su seguro), pero el sitio web puede darle el precio si no utiliz? ning?n seguro.  ?- Puede imprimir el cup?n correspondiente y llevarlo con su receta a la farmacia.  ?- Tambi?n puede pasar por nuestra oficina durante el horario de atenci?n regular y recoger una tarjeta de cupones de GoodRx.  ?- Si necesita que su receta se env?e electr?nicamente a Chiropodist, informe a nuestra oficina a trav?s de MyChart de [place] o por tel?fono llamando al [phone] y presione la opci?n 4. ? ?

## 2021-09-09 ENCOUNTER — Encounter: Payer: Self-pay | Admitting: Dermatology

## 2021-09-11 ENCOUNTER — Other Ambulatory Visit: Payer: Self-pay | Admitting: Nurse Practitioner

## 2021-09-11 DIAGNOSIS — L732 Hidradenitis suppurativa: Secondary | ICD-10-CM

## 2021-09-11 NOTE — Telephone Encounter (Signed)
Requested medications are due for refill today.  no ? ?Requested medications are on the active medications list.  no ? ?Last refill. 03/10/2021 ? ?Future visit scheduled.   yes ? ?Notes to clinic.  300 mg dosage was discontinued 03/13/2021. Pt now takes '400mg'$ . ? ? ? ?Requested Prescriptions  ?Pending Prescriptions Disp Refills  ? gabapentin (NEURONTIN) 300 MG capsule [Pharmacy Med Name: GABAPENTIN 300 MG CAPSULE] 270 capsule 0  ?  Sig: TAKE 1 CAPSULE BY MOUTH THREE TIMES A DAY  ?  ? Neurology: Anticonvulsants - gabapentin Passed - 09/11/2021 10:48 AM  ?  ?  Passed - Cr in normal range and within 360 days  ?  Creatinine, Ser  ?Date Value Ref Range Status  ?07/07/2021 0.95 0.76 - 1.27 mg/dL Final  ?  ?  ?  ?  Passed - Completed PHQ-2 or PHQ-9 in the last 360 days  ?  ?  Passed - Valid encounter within last 12 months  ?  Recent Outpatient Visits   ? ?      ? 1 month ago Seasonal allergic rhinitis due to pollen  ? [place], NP  ? 2 months ago Essential hypertension  ? [place], NP  ? 2 months ago Hidradenitis suppurativa  ? [place], NP  ? 6 months ago Essential hypertension  ? [place], NP  ? 6 months ago Annual physical exam  ? [facility] Jon Billings, NP  ? ?  ?  ?Future Appointments   ? ?        ? In 1 month Ralene Bathe, MD [place]  ? In 3 months Jon Billings, NP [facility]  ? ?  ? ?  ?  ?  ?  ?

## 2021-09-30 ENCOUNTER — Other Ambulatory Visit: Payer: Self-pay | Admitting: Dermatology

## 2021-09-30 DIAGNOSIS — L732 Hidradenitis suppurativa: Secondary | ICD-10-CM

## 2021-10-13 ENCOUNTER — Ambulatory Visit (INDEPENDENT_AMBULATORY_CARE_PROVIDER_SITE_OTHER): Payer: Medicaid Other | Admitting: Dermatology

## 2021-10-13 VITALS — Wt 160.0 lb

## 2021-10-13 DIAGNOSIS — L732 Hidradenitis suppurativa: Secondary | ICD-10-CM | POA: Diagnosis not present

## 2021-10-13 DIAGNOSIS — L701 Acne conglobata: Secondary | ICD-10-CM

## 2021-10-13 DIAGNOSIS — L853 Xerosis cutis: Secondary | ICD-10-CM | POA: Diagnosis not present

## 2021-10-13 DIAGNOSIS — K130 Diseases of lips: Secondary | ICD-10-CM

## 2021-10-13 DIAGNOSIS — Z79899 Other long term (current) drug therapy: Secondary | ICD-10-CM

## 2021-10-13 MED ORDER — ISOTRETINOIN 30 MG PO CAPS: 30.0000 mg | ORAL_CAPSULE | Freq: Every day | ORAL | 0 refills | Status: DC

## 2021-10-13 NOTE — Patient Instructions (Signed)

## 2021-10-13 NOTE — Progress Notes (Signed)
   Isotretinoin Follow-Up Visit   Subjective  Caleb Brooks is a 61 y.o. male who presents for the following: HS  (Currently on Isotretinoin '30mg'$  po QD - patient c/o worsening depression, insomnia and joint pain especially in the R hip. ). Patient was taking Celebrex but the prescribing doctor no longer prescribes it for him.   Week # 4 (since restarting last month)   Isotretinoin F/U - 10/13/21 1200       Isotretinoin Follow Up   iPledge # [phone]    Date 10/13/21    Weight 160 lb (72.6 kg)    Acne breakouts since last visit? Yes      Dosage   Target Dosage (mg) 14520    Current (To Date) Dosage (mg) 900    To Go Dosage (mg) 13620      Side Effects   Skin Chapped Lips;Dry Lips;Dry Skin    Gastrointestinal WNL    Neurological Mood Swings   worsening depression depending on the day   Constitutional Muscle/joint aches   especially in the right hip           Side effects: Dry skin, dry lips  Denies changes in night vision, shortness of breath, abdominal pain, nausea, vomiting, diarrhea, blood in stool or urine, visual changes, headaches, epistaxis, joint pain, myalgias, mood changes, depression, or suicidal ideation.   The following portions of the chart were reviewed this encounter and updated as appropriate: medications, allergies, medical history  Review of Systems:  No other skin or systemic complaints except as noted in HPI or Assessment and Plan.  Objective  Well appearing patient in no apparent distress; mood and affect are within normal limits.  An examination of the face, neck, chest, and back was performed and relevant findings are noted below.    Assessment & Plan   Hidradenitis suppurativa and Acne Pubic  HS and Acne is Severe; chronic and persistent; not at goal. Patient is on Isotretinoin -  requiring FDA mandated monthly evaluations and laboratory monitoring.  While taking isotretinoin, do not share pills and do not donate blood. Generic  isotretinoin is best absorbed when taken with a fatty meal. Isotretinoin can make you sensitive to the sun. Daily careful sun protection including sunscreen SPF 30+ when outdoors is recommended.  Discussed increasing dose. Patient would like to increase dose in the future but not today.  Continue Isotretinoin '30mg'$  po QD. #30 0RF.   Ipledge#[phone] [facility] [address]   Dose to date: 900 mg  Total mg/kg: 12.4 mg/kg   Related Medications ISOtretinoin (ACCUTANE) 30 MG capsule Take 1 capsule (30 mg total) by mouth daily.  Xerosis secondary to isotretinoin therapy - Continue emollients as directed  Cheilitis secondary to isotretinoin therapy - Continue lip balm as directed, Dr. Luvenia Heller Cortibalm recommended  Long term medication management (isotretinoin) - While taking Isotretinoin and for 30 days after you finish the medication, do not share pills, do not donate blood. Isotretinoin is best absorbed when taken with a fatty meal. Isotretinoin can make you sensitive to the sun. Daily careful sun protection including sunscreen SPF 30+ when outdoors is recommended.  Follow-up in 30 days.  [name], CMA, am acting as scribe for [name], MD . Documentation: I have reviewed the above documentation for accuracy and completeness, and I agree with the above.  [name], MD

## 2021-10-28 ENCOUNTER — Encounter: Payer: Self-pay | Admitting: Dermatology

## 2021-11-17 ENCOUNTER — Ambulatory Visit (INDEPENDENT_AMBULATORY_CARE_PROVIDER_SITE_OTHER): Payer: Medicaid Other | Admitting: Dermatology

## 2021-11-17 VITALS — Wt 160.0 lb

## 2021-11-17 DIAGNOSIS — K130 Diseases of lips: Secondary | ICD-10-CM

## 2021-11-17 DIAGNOSIS — L701 Acne conglobata: Secondary | ICD-10-CM

## 2021-11-17 DIAGNOSIS — L732 Hidradenitis suppurativa: Secondary | ICD-10-CM

## 2021-11-17 DIAGNOSIS — L853 Xerosis cutis: Secondary | ICD-10-CM | POA: Diagnosis not present

## 2021-11-17 DIAGNOSIS — Z79899 Other long term (current) drug therapy: Secondary | ICD-10-CM | POA: Diagnosis not present

## 2021-11-17 MED ORDER — ISOTRETINOIN 20 MG PO CAPS: ORAL_CAPSULE | ORAL | 0 refills | Status: DC

## 2021-11-17 NOTE — Progress Notes (Signed)
Isotretinoin Follow-Up Visit   Subjective  Caleb Brooks is a 61 y.o. male who presents for the following: HS with acne (Currently on Isotretinoin '30mg'$  po QD wk 8 - pt c/o all xerosis, increased depression, and joint aches/pains in the R hip).  Week # 8   Isotretinoin F/U - 11/17/21 1200       Isotretinoin Follow Up   iPledge # [phone]    Date 11/17/21    Weight 160 lb (72.6 kg)    Acne breakouts since last visit? Yes      Dosage   Target Dosage (mg) 14520    Current (To Date) Dosage (mg) 1800    To Go Dosage (mg) 12720      Side Effects   Skin Chapped Lips;Dry Eyes;Dry Lips;Dry Nose;Dry Skin    Gastrointestinal WNL    Neurological Mood Swings   worsening depression   Constitutional Muscle/joint aches   in the R hip           Side effects: Dry skin, dry lips  Denies changes in night vision, shortness of breath, abdominal pain, nausea, vomiting, diarrhea, blood in stool or urine, visual changes, headaches, epistaxis, joint pain, myalgias, mood changes, depression, or suicidal ideation.   The following portions of the chart were reviewed this encounter and updated as appropriate: medications, allergies, medical history  Review of Systems:  No other skin or systemic complaints except as noted in HPI or Assessment and Plan.  Objective  Well appearing patient in no apparent distress; mood and affect are within normal limits.  An examination of the face, neck, chest, and back was performed and relevant findings are noted below.    Assessment & Plan   Hidradenitis suppurativa And acne Groin, buttocks, face  Acne is Severe; chronic and persistent; not at goal. Patient is on Isotretinoin -  requiring FDA mandated monthly evaluations and laboratory monitoring.  While taking isotretinoin, do not share pills and do not donate blood. Generic isotretinoin is best absorbed when taken with a fatty meal. Isotretinoin can make you sensitive to the sun. Daily careful sun  protection including sunscreen SPF 30+ when outdoors is recommended.   Due to s/e of increased depression take one tab po QOD (Isotretinoin '30mg'$ ) until finished (has 4 pills left). Then decrease Isotretinoin '20mg'$  po QOD on Monday, Wednesday, and Friday #12 0RF.   Advised patient to stop medication if any suicidal thoughts or ideations.   Ipledge#[phone] [facility] [address]    Dose to date: 1800 mg  Total mg/kg: 24.8 mg/kg   ISOtretinoin (ACCUTANE) 20 MG capsule - Groin, buttocks, face Take one cap po QD only on Monday, Wednesday, and Friday (Patient with history of depression prior to starting on isotretinoin.  He feels maybe a little more down lately so we will decrease his dose to 20 mg 3 times a week.  He is to stop the medication if he continues to feel low.  He denies suicidal ideation.)  Xerosis secondary to isotretinoin therapy - Continue emollients as directed  Cheilitis secondary to isotretinoin therapy - Continue lip balm as directed, Dr. Luvenia Heller Cortibalm recommended  Long term medication management (isotretinoin) - While taking Isotretinoin and for 30 days after you finish the medication, do not share pills, do not donate blood. Isotretinoin is best absorbed when taken with a fatty meal. Isotretinoin can make you sensitive to the sun. Daily careful sun protection including sunscreen SPF 30+ when outdoors is recommended.  Follow-up in 30 days.  I,  Rudell Cobb, CMA, am acting as scribe for [name], MD . Documentation: I have reviewed the above documentation for accuracy and completeness, and I agree with the above.  [name], MD

## 2021-11-18 ENCOUNTER — Encounter: Payer: Self-pay | Admitting: Dermatology

## 2021-11-21 ENCOUNTER — Ambulatory Visit: Payer: Self-pay | Admitting: *Deleted

## 2021-11-21 NOTE — Telephone Encounter (Signed)
Reason for Disposition  Pus (white, yellow) or bloody discharge from end of penis    Has inguinal hernia left side also  Answer Assessment - Initial Assessment Questions 1. SYMPTOM: "What's the main symptom you're concerned about?" (e.g., discharge from penis, rash, pain, itching, swelling)     Penis discharge that is white.  Left testicle hurts 2. LOCATION: "Where is the pain located?"     Left testicle 3. ONSET: "When did pain  start?"     Testicle pain 2 days ago.   Discharge noticed yesterday 4. PAIN: "Is there any pain?" If Yes, ask: "How bad is it?"  (Scale 1-10; or mild, moderate, severe)     Left testicle 5. URINE: "Any difficulty passing urine?" If Yes, ask: "When was the last time?"     No changes in urine.   No odor to urine or discharge 6. CAUSE: "What do you think is causing the symptoms?"     2 weeks ago exposed to someone possible got something from them 7. OTHER SYMPTOMS: "Do you have any other symptoms?" (e.g., fever, abdominal pain, blood in urine)     See above  Protocols used: Penis and Scrotum Symptoms-A-AH

## 2021-11-21 NOTE — Telephone Encounter (Signed)
  Chief Complaint: discharge from penis, pain left testicle and left inguinal hernia Symptoms: pain left testicle, white discharge from penis Frequency: Noticed discharge yesterday.   Testicle pain 2 days ago Pertinent Negatives: Patient denies urinary symptoms or abd pain Disposition: '[]'$ ED /'[]'$ Urgent Care (no appt availability in office) / '[x]'$ Appointment(In office/virtual)/ '[]'$  [place] [facility]/ '[]'$ Home Care/ '[]'$ Refused Recommended Disposition /'[]'$ [place] [facility]/ '[]'$  Follow-up with PCP Additional Notes: Appt made with Dr. Neomia Dear for 11/25/2021 at 2:40 with instructions to go to the ED over the weekend if hernia became hard, unable to push it back in or painful.   Pt verbalized understanding.

## 2021-11-22 ENCOUNTER — Emergency Department
Admission: EM | Admit: 2021-11-22 | Discharge: 2021-11-22 | Disposition: A | Discharge status: Home / Self Care | Payer: Medicaid Other | Attending: Emergency Medicine | Admitting: Emergency Medicine

## 2021-11-22 ENCOUNTER — Encounter: Payer: Self-pay | Admitting: Emergency Medicine

## 2021-11-22 ENCOUNTER — Emergency Department: Payer: Medicaid Other

## 2021-11-22 DIAGNOSIS — N390 Urinary tract infection, site not specified: Secondary | ICD-10-CM | POA: Diagnosis not present

## 2021-11-22 DIAGNOSIS — N451 Epididymitis: Secondary | ICD-10-CM | POA: Insufficient documentation

## 2021-11-22 DIAGNOSIS — I10 Essential (primary) hypertension: Secondary | ICD-10-CM | POA: Insufficient documentation

## 2021-11-22 DIAGNOSIS — R300 Dysuria: Secondary | ICD-10-CM | POA: Diagnosis present

## 2021-11-22 LAB — CBC WITH DIFFERENTIAL/PLATELET
Abs Immature Granulocytes: 0.02 10*3/uL (ref 0.00–0.07)
Basophils Absolute: 0 10*3/uL (ref 0.0–0.1)
Basophils Relative: 0 %
Eosinophils Absolute: 0 10*3/uL (ref 0.0–0.5)
Eosinophils Relative: 0 %
HCT: 43.3 % (ref 39.0–52.0)
Hemoglobin: 14.1 g/dL (ref 13.0–17.0)
Immature Granulocytes: 0 %
Lymphocytes Relative: 13 %
Lymphs Abs: 1.5 10*3/uL (ref 0.7–4.0)
MCH: 30.3 pg (ref 26.0–34.0)
MCHC: 32.6 g/dL (ref 30.0–36.0)
MCV: 92.9 fL (ref 80.0–100.0)
Monocytes Absolute: 0.9 10*3/uL (ref 0.1–1.0)
Monocytes Relative: 8 %
Neutro Abs: 8.8 10*3/uL — ABNORMAL HIGH (ref 1.7–7.7)
Neutrophils Relative %: 79 %
Platelets: 385 10*3/uL (ref 150–400)
RBC: 4.66 MIL/uL (ref 4.22–5.81)
RDW: 13.8 % (ref 11.5–15.5)
WBC: 11.2 10*3/uL — ABNORMAL HIGH (ref 4.0–10.5)
nRBC: 0 % (ref 0.0–0.2)

## 2021-11-22 LAB — URINALYSIS, ROUTINE W REFLEX MICROSCOPIC
Bilirubin Urine: NEGATIVE
Glucose, UA: 50 mg/dL — AB
Ketones, ur: NEGATIVE mg/dL
Nitrite: NEGATIVE
Protein, ur: NEGATIVE mg/dL
Specific Gravity, Urine: 1.006 (ref 1.005–1.030)
WBC, UA: >50 WBC/hpf — ABNORMAL HIGH (ref 0–5)
pH: 5 (ref 5.0–8.0)

## 2021-11-22 LAB — COMPREHENSIVE METABOLIC PANEL
ALT: 10 U/L (ref 0–44)
AST: 15 U/L (ref 15–41)
Albumin: 3.4 g/dL — ABNORMAL LOW (ref 3.5–5.0)
Alkaline Phosphatase: 117 U/L (ref 38–126)
Anion gap: 11 (ref 5–15)
BUN: 10 mg/dL (ref 6–20)
CO2: 22 mmol/L (ref 22–32)
Calcium: 8.6 mg/dL — ABNORMAL LOW (ref 8.9–10.3)
Chloride: 99 mmol/L (ref 98–111)
Creatinine, Ser: 0.85 mg/dL (ref 0.61–1.24)
GFR, Estimated: >60 mL/min (ref >60)
Glucose, Bld: 114 mg/dL — ABNORMAL HIGH (ref 70–99)
Potassium: 3.3 mmol/L — ABNORMAL LOW (ref 3.5–5.1)
Sodium: 132 mmol/L — ABNORMAL LOW (ref 135–145)
Total Bilirubin: 0.5 mg/dL (ref 0.3–1.2)
Total Protein: 8.3 g/dL — ABNORMAL HIGH (ref 6.5–8.1)

## 2021-11-22 LAB — CHLAMYDIA/NGC RT PCR (ARMC ONLY)
Chlamydia Tr: NOT DETECTED
N gonorrhoeae: NOT DETECTED

## 2021-11-22 MED ORDER — SODIUM CHLORIDE 0.9 % IV SOLN
1.0000 g | Freq: Once | INTRAVENOUS | Status: AC
  Administered 2021-11-22: 1 g via INTRAVENOUS
  Filled 2021-11-22: qty 10

## 2021-11-22 MED ORDER — SODIUM CHLORIDE 0.9 % IV BOLUS
1000.0000 mL | Freq: Once | INTRAVENOUS | Status: AC
  Administered 2021-11-22: 1000 mL via INTRAVENOUS

## 2021-11-22 MED ORDER — KETOROLAC TROMETHAMINE 30 MG/ML IJ SOLN
15.0000 mg | Freq: Once | INTRAMUSCULAR | Status: AC
  Administered 2021-11-22: 15 mg via INTRAVENOUS
  Filled 2021-11-22: qty 1

## 2021-11-22 MED ORDER — TRAMADOL HCL 50 MG PO TABS: 50.0000 mg | ORAL_TABLET | Freq: Four times a day (QID) | ORAL | 0 refills | Status: DC | PRN

## 2021-11-22 MED ORDER — OXYCODONE-ACETAMINOPHEN 5-325 MG PO TABS: 2.0000 | ORAL_TABLET | Freq: Once | ORAL | Status: DC

## 2021-11-22 MED ORDER — OXYCODONE-ACETAMINOPHEN 5-325 MG PO TABS
2.0000 | ORAL_TABLET | Freq: Once | ORAL | Status: AC
  Administered 2021-11-22: 2 via ORAL
  Filled 2021-11-22: qty 2

## 2021-11-22 MED ORDER — CEPHALEXIN 500 MG PO CAPS: 500.0000 mg | ORAL_CAPSULE | Freq: Three times a day (TID) | ORAL | 0 refills | Status: DC

## 2021-11-22 MED ORDER — AZITHROMYCIN 500 MG PO TABS
1000.0000 mg | ORAL_TABLET | Freq: Once | ORAL | Status: AC
  Administered 2021-11-22: 1000 mg via ORAL
  Filled 2021-11-22: qty 2

## 2021-11-22 NOTE — ED Provider Notes (Signed)
[facility] Provider Note    Event Date/Time   First MD Initiated Contact with Patient 11/22/21 0222     (approximate)  History   Chief Complaint: Groin Pain  HPI  Caleb Brooks is a 61 y.o. male with a past medical history of anxiety, hypertension, presents to the emergency department for left testicle pain dysuria.  According to the patient over the past 2 days he has been experiencing pain in the left testicle and has been having burning with urination.  He states today he noted some discharge from the penis as well.  No known fever.  No back pain or abdominal pain.  Physical Exam   Triage Vital Signs: ED Triage Vitals  Enc Vitals Group     BP 11/22/21 0059 (!) 122/91     Pulse Rate 11/22/21 0059 83     Resp 11/22/21 0059 20     Temp 11/22/21 0059 99 F (37.2 C)     Temp Source 11/22/21 0059 Oral     SpO2 11/22/21 0059 96 %     Weight 11/22/21 0058 156 lb (70.8 kg)     Height 11/22/21 0058 '5\' 11"'$  (1.803 m)     Head Circumference --      Peak Flow --      Pain Score 11/22/21 0058 10     Pain Loc --      Pain Edu? --      Excl. in [place]? --     Most recent vital signs: Vitals:   11/22/21 0059  BP: (!) 122/91  Pulse: 83  Resp: 20  Temp: 99 F (37.2 C)  SpO2: 96%    General: Awake, no distress.  CV:  Good peripheral perfusion.  Regular rate and rhythm  Resp:  Normal effort.  Equal breath sounds bilaterally.  Abd:  No distention.  Soft, nontender.  No rebound or guarding. Other:  Moderate tenderness palpation of the left testicle especially over the epididymis.  No obvious mass, no hernia palpated.  No skin color changes or swelling noted.   ED Results / Procedures / Treatments   RADIOLOGY  Ultrasound consistent with left-sided epididymitis   MEDICATIONS ORDERED IN ED: Medications  cefTRIAXone (ROCEPHIN) 1 g in sodium chloride 0.9 % 100 mL IVPB (has no administration in time range)  sodium chloride 0.9 % bolus 1,000 mL (has no  administration in time range)  azithromycin (ZITHROMAX) tablet 1,000 mg (has no administration in time range)     IMPRESSION / MDM / ASSESSMENT AND PLAN / ED COURSE  I reviewed the triage vital signs and the nursing notes.  Patient's presentation is most consistent with acute presentation with potential threat to life or bodily function.  Patient presents emergency department for left testicular pain x2 days along with dysuria and now urethral discharge today.  Symptoms are concerning for urethritis and epididymitis possibly STD related.  I have added on labs looking for STDs.  Ultrasound consistent with left-sided epididymitis.  Urinalysis shows greater than 50 white cells with white blood cell clumps consistent with urinary tract infection versus STD.  I have sent a gonorrhea and Chlamydia test from the urine.  We will dose IV Rocephin as well as oral Zithromax.  We will discharge with a course of Keflex.  Discussed PCP follow-up.  Patient agreeable to plan.  FINAL CLINICAL IMPRESSION(S) / ED DIAGNOSES   Epididymitis Urinary tract infection  Note:  This document was prepared using Dragon voice recognition software and  may include unintentional dictation errors.   Harvest Dark, MD 11/22/21 [phone]

## 2021-11-22 NOTE — Discharge Instructions (Addendum)
Please take antibiotic for its entire course.  Please take pain medication as needed, as prescribed.  Avoid sexual activity x10 days.

## 2021-11-22 NOTE — ED Triage Notes (Signed)
Pt presents via POV with complaint of left testicle pain with associated lower abdominal pain that started last night. Of note, the patient has a hx of boils in his groin area and he states having intermittent fevers since last week due to one his boil bursting. Denies hematuria or dysuria.

## 2021-11-25 ENCOUNTER — Ambulatory Visit: Payer: Medicaid Other | Admitting: Internal Medicine

## 2021-11-25 ENCOUNTER — Encounter: Payer: Self-pay | Admitting: Internal Medicine

## 2021-11-25 VITALS — BP 124/82 | HR 64 | Temp 98.3°F | Ht 70.98 in | Wt 163.6 lb

## 2021-11-25 DIAGNOSIS — N451 Epididymitis: Secondary | ICD-10-CM

## 2021-11-25 NOTE — Progress Notes (Signed)
BP 124/82   Pulse 64   Temp 98.3 F (36.8 C) (Oral)   Ht 5' 10.98" (1.803 m)   Wt 163 lb 9.6 oz (74.2 kg)   SpO2 99%   BMI 22.83 kg/m    Subjective:    Patient ID: Caleb Brooks, male    DOB: 12/12/1960, 61 y.o.   MRN: [id number]  Chief Complaint  Patient presents with  . Urinary Tract Infection    Was seen in the ER 6/17, dx'd with UTI and epididymitis. Patient states he has a pain score of 12 and left teste is swollen. Was sent home on ABX and Tramadol. Patient states that he is on his last tramadol and after that he will be in pain    HPI: Caleb Brooks is a 61 y.o. male  Urinary Tract Infection  This is a new problem. Pertinent negatives include no frequency, hematuria or urgency.    Chief Complaint  Patient presents with  . Urinary Tract Infection    Was seen in the ER 6/17, dx'd with UTI and epididymitis. Patient states he has a pain score of 12 and left teste is swollen. Was sent home on ABX and Tramadol. Patient states that he is on his last tramadol and after that he will be in pain    Relevant past medical, surgical, family and social history reviewed and updated as indicated. Interim medical history since our last visit reviewed. Allergies and medications reviewed and updated.  Review of Systems  Genitourinary:  Positive for penile pain and penile swelling. Negative for decreased urine volume, frequency, genital sores, hematuria, penile discharge, scrotal swelling, testicular pain and urgency.    Per HPI unless specifically indicated above     Objective:    BP 124/82   Pulse 64   Temp 98.3 F (36.8 C) (Oral)   Ht 5' 10.98" (1.803 m)   Wt 163 lb 9.6 oz (74.2 kg)   SpO2 99%   BMI 22.83 kg/m   Wt Readings from Last 3 Encounters:  11/25/21 163 lb 9.6 oz (74.2 kg)  11/22/21 156 lb (70.8 kg)  11/17/21 160 lb (72.6 kg)    Physical Exam Constitutional:      General: He is not in acute distress.    Appearance: He is not ill-appearing, toxic-appearing  or diaphoretic.  Skin:    Coloration: Skin is not jaundiced or pale.     Findings: No bruising, erythema or lesion.   Results for orders placed or performed during the hospital encounter of 11/22/21  Libby rt PCR ([place] only)   Specimen: Urine  Result Value Ref Range   Specimen source GC/Chlam URINE, RANDOM    Chlamydia Tr NOT DETECTED NOT DETECTED   N gonorrhoeae NOT DETECTED NOT DETECTED  Urinalysis, Routine w reflex microscopic Urine, Clean Catch  Result Value Ref Range   Color, Urine YELLOW (A) YELLOW   APPearance CLOUDY (A) CLEAR   Specific Gravity, Urine 1.006 1.005 - 1.030   pH 5.0 5.0 - 8.0   Glucose, UA 50 (A) NEGATIVE mg/dL   Hgb urine dipstick MODERATE (A) NEGATIVE   Bilirubin Urine NEGATIVE NEGATIVE   Ketones, ur NEGATIVE NEGATIVE mg/dL   Protein, ur NEGATIVE NEGATIVE mg/dL   Nitrite NEGATIVE NEGATIVE   Leukocytes,Ua LARGE (A) NEGATIVE   RBC / HPF 6-10 0 - 5 RBC/hpf   WBC, UA >50 (H) 0 - 5 WBC/hpf   Bacteria, UA RARE (A) NONE SEEN   Squamous Epithelial / LPF 0-5 0 -  5   WBC Clumps PRESENT   CBC with Differential  Result Value Ref Range   WBC 11.2 (H) 4.0 - 10.5 K/uL   RBC 4.66 4.22 - 5.81 MIL/uL   Hemoglobin 14.1 13.0 - 17.0 g/dL   HCT 43.3 39.0 - 52.0 %   MCV 92.9 80.0 - 100.0 fL   MCH 30.3 26.0 - 34.0 pg   MCHC 32.6 30.0 - 36.0 g/dL   RDW 13.8 11.5 - 15.5 %   Platelets 385 150 - 400 K/uL   nRBC 0.0 0.0 - 0.2 %   Neutrophils Relative % 79 %   Neutro Abs 8.8 (H) 1.7 - 7.7 K/uL   Lymphocytes Relative 13 %   Lymphs Abs 1.5 0.7 - 4.0 K/uL   Monocytes Relative 8 %   Monocytes Absolute 0.9 0.1 - 1.0 K/uL   Eosinophils Relative 0 %   Eosinophils Absolute 0.0 0.0 - 0.5 K/uL   Basophils Relative 0 %   Basophils Absolute 0.0 0.0 - 0.1 K/uL   Immature Granulocytes 0 %   Abs Immature Granulocytes 0.02 0.00 - 0.07 K/uL  Comprehensive metabolic panel  Result Value Ref Range   Sodium 132 (L) 135 - 145 mmol/L   Potassium 3.3 (L) 3.5 - 5.1 mmol/L    Chloride 99 98 - 111 mmol/L   CO2 22 22 - 32 mmol/L   Glucose, Bld 114 (H) 70 - 99 mg/dL   BUN 10 6 - 20 mg/dL   Creatinine, Ser 0.85 0.61 - 1.24 mg/dL   Calcium 8.6 (L) 8.9 - 10.3 mg/dL   Total Protein 8.3 (H) 6.5 - 8.1 g/dL   Albumin 3.4 (L) 3.5 - 5.0 g/dL   AST 15 15 - 41 U/L   ALT 10 0 - 44 U/L   Alkaline Phosphatase 117 38 - 126 U/L   Total Bilirubin 0.5 0.3 - 1.2 mg/dL   GFR, Estimated >60 >60 mL/min   Anion gap 11 5 - 15        Current Outpatient Medications:  .  amLODipine (NORVASC) 5 MG tablet, TAKE 1 TABLET BY MOUTH EVERY DAY, Disp: 90 tablet, Rfl: 1 .  cephALEXin (KEFLEX) 500 MG capsule, Take 1 capsule (500 mg total) by mouth 3 (three) times daily., Disp: 30 capsule, Rfl: 0 .  cetirizine (ZYRTEC) 10 MG tablet, Take 1 tablet (10 mg total) by mouth daily., Disp: 30 tablet, Rfl: 11 .  folic acid (FOLVITE) 1 MG tablet, TAKE 1 TABLET BY MOUTH EVERY DAY (NOT COVERED), Disp: 90 tablet, Rfl: 1 .  gabapentin (NEURONTIN) 400 MG capsule, TAKE 1 CAPSULE BY MOUTH 3 TIMES DAILY., Disp: 270 capsule, Rfl: 1 .  ISOtretinoin (ACCUTANE) 20 MG capsule, Take one cap po QD only on Monday, Wednesday, and Friday, Disp: 12 capsule, Rfl: 0 .  nicotine (NICODERM CQ - DOSED IN MG/24 HOURS) 21 mg/24hr patch, APPLY 1 PATCH ONTO THE SKIN EVERY DAY, Disp: 28 patch, Rfl: 0 .  thiamine (VITAMIN B-1) 100 MG tablet, TAKE ONE TABLET BY MOUTH EVERY DAY, Disp: 90 tablet, Rfl: 1 .  thiamine 100 MG tablet, TAKE 1 TABLET BY MOUTH EVERY DAY, Disp: 90 tablet, Rfl: 1 .  traMADol (ULTRAM) 50 MG tablet, Take 1 tablet (50 mg total) by mouth every 6 (six) hours as needed., Disp: 10 tablet, Rfl: 0    Assessment & Plan:  Epididydimitis : Will refer to urology  Continue tramadol as rx for such   Problem List Items Addressed This Visit   None  No orders of the defined types were placed in this encounter.    No orders of the defined types were placed in this encounter.    Follow up plan: No follow-ups on  file.

## 2021-12-01 ENCOUNTER — Encounter: Payer: Self-pay | Admitting: Physician Assistant

## 2021-12-01 ENCOUNTER — Ambulatory Visit (INDEPENDENT_AMBULATORY_CARE_PROVIDER_SITE_OTHER): Payer: Medicaid Other | Admitting: Physician Assistant

## 2021-12-01 VITALS — BP 120/85 | HR 82 | Ht 70.98 in | Wt 162.0 lb

## 2021-12-01 DIAGNOSIS — N451 Epididymitis: Secondary | ICD-10-CM

## 2021-12-01 LAB — URINALYSIS, COMPLETE
Bilirubin, UA: NEGATIVE
Glucose, UA: NEGATIVE
Ketones, UA: NEGATIVE
Leukocytes,UA: NEGATIVE
Nitrite, UA: NEGATIVE
Protein,UA: NEGATIVE
Specific Gravity, UA: 1.02 (ref 1.005–1.030)
Urobilinogen, Ur: 0.2 mg/dL (ref 0.2–1.0)
pH, UA: 5 (ref 5.0–7.5)

## 2021-12-01 LAB — MICROSCOPIC EXAMINATION

## 2021-12-01 NOTE — Progress Notes (Signed)
Patient presented to clinic today for ED follow-up of epididymitis.  He left a urine sample for UA and culture, which have been ordered today, but left without being seen.  We will plan to have him follow-up in clinic next week.  This is a no charge visit.

## 2021-12-04 LAB — CULTURE, URINE COMPREHENSIVE

## 2021-12-08 ENCOUNTER — Telehealth: Payer: Self-pay | Admitting: Physician Assistant

## 2021-12-08 ENCOUNTER — Ambulatory Visit: Payer: Medicaid Other | Admitting: Physician Assistant

## 2021-12-08 NOTE — Telephone Encounter (Signed)
Pt LMOM asking about lab results.  Per Sam:  repeat UA and culture were fine, but if he's having persistent symptoms or pain he should come in for an exam.

## 2021-12-22 ENCOUNTER — Ambulatory Visit: Payer: Medicaid Other | Admitting: Dermatology

## 2021-12-22 ENCOUNTER — Ambulatory Visit: Payer: Medicaid Other | Admitting: Nurse Practitioner

## 2021-12-22 ENCOUNTER — Encounter: Payer: Self-pay | Admitting: Nurse Practitioner

## 2021-12-22 VITALS — BP 129/84 | HR 77 | Temp 98.5°F | Wt 159.4 lb

## 2021-12-22 DIAGNOSIS — I10 Essential (primary) hypertension: Secondary | ICD-10-CM

## 2021-12-22 DIAGNOSIS — N451 Epididymitis: Secondary | ICD-10-CM | POA: Diagnosis not present

## 2021-12-22 LAB — URINALYSIS, ROUTINE W REFLEX MICROSCOPIC
Bilirubin, UA: NEGATIVE
Glucose, UA: NEGATIVE
Ketones, UA: NEGATIVE
Nitrite, UA: NEGATIVE
Protein,UA: NEGATIVE
RBC, UA: NEGATIVE
Specific Gravity, UA: <1.005 — ABNORMAL LOW (ref 1.005–1.030)
Urobilinogen, Ur: 0.2 mg/dL (ref 0.2–1.0)
pH, UA: 5.5 (ref 5.0–7.5)

## 2021-12-22 LAB — MICROSCOPIC EXAMINATION
Bacteria, UA: NONE SEEN
RBC, Urine: NONE SEEN /hpf (ref 0–2)

## 2021-12-22 MED ORDER — CELECOXIB 100 MG PO CAPS
100.0000 mg | ORAL_CAPSULE | Freq: Every day | ORAL | 0 refills | Status: DC
Start: 2021-12-22 — End: 2022-01-19

## 2021-12-22 MED ORDER — SILDENAFIL CITRATE 100 MG PO TABS: 50.0000 mg | ORAL_TABLET | Freq: Every day | ORAL | 2 refills | Status: DC | PRN

## 2021-12-22 NOTE — Assessment & Plan Note (Signed)
Will repeat urine culture at visit today.  Will treat patient based on lab results.  Follow up if symptoms do not improve.

## 2021-12-22 NOTE — Progress Notes (Signed)
BP 129/84   Pulse 77   Temp 98.5 F (36.9 C) (Oral)   Wt 159 lb 6.4 oz (72.3 kg)   SpO2 98%   BMI 22.24 kg/m    Subjective:    Patient ID: Caleb Brooks, male    DOB: 01/23/1961, 61 y.o.   MRN: [id number]  HPI: Caleb Brooks is a 61 y.o. male  Chief Complaint  Patient presents with   Hypertension    Follow up    HYPERTENSION Hypertension status: controlled  Satisfied with current treatment? no Duration of hypertension: years BP monitoring frequency:  not checking BP range:  BP medication side effects:  no Medication compliance: excellent compliance Previous BP meds:amlodipine Aspirin: no Recurrent headaches: no Visual changes: no Palpitations: no Dyspnea: no Chest pain: no Lower extremity edema: no Dizzy/lightheaded: no  EPIDIDYMITIS Patient states he recently had epididymitis.  He wants to make sure the infection has cleared. He is having a little bit of pain in the area.  Would like to have something for ED.   Relevant past medical, surgical, family and social history reviewed and updated as indicated. Interim medical history since our last visit reviewed. Allergies and medications reviewed and updated.  Review of Systems  Eyes:  Negative for visual disturbance.  Respiratory:  Negative for shortness of breath.   Cardiovascular:  Negative for chest pain and leg swelling.  Genitourinary:  Positive for penile pain.  Neurological:  Negative for light-headedness and headaches.    Per HPI unless specifically indicated above     Objective:    BP 129/84   Pulse 77   Temp 98.5 F (36.9 C) (Oral)   Wt 159 lb 6.4 oz (72.3 kg)   SpO2 98%   BMI 22.24 kg/m   Wt Readings from Last 3 Encounters:  12/22/21 159 lb 6.4 oz (72.3 kg)  12/01/21 162 lb (73.5 kg)  11/25/21 163 lb 9.6 oz (74.2 kg)    Physical Exam Vitals and nursing note reviewed.  Constitutional:      General: He is not in acute distress.    Appearance: Normal appearance. He is not  ill-appearing, toxic-appearing or diaphoretic.  HENT:     Head: Normocephalic.     Right Ear: External ear normal.     Left Ear: External ear normal.     Nose: Nose normal. No congestion or rhinorrhea.     Mouth/Throat:     Mouth: Mucous membranes are moist.  Eyes:     General:        Right eye: No discharge.        Left eye: No discharge.     Extraocular Movements: Extraocular movements intact.     Conjunctiva/sclera: Conjunctivae normal.     Pupils: Pupils are equal, round, and reactive to light.  Cardiovascular:     Rate and Rhythm: Normal rate and regular rhythm.     Heart sounds: No murmur heard. Pulmonary:     Effort: Pulmonary effort is normal. No respiratory distress.     Breath sounds: Normal breath sounds. No wheezing, rhonchi or rales.  Abdominal:     General: Abdomen is flat. Bowel sounds are normal.  Musculoskeletal:     Cervical back: Normal range of motion and neck supple.  Skin:    General: Skin is warm and dry.     Capillary Refill: Capillary refill takes less than 2 seconds.  Neurological:     General: No focal deficit present.     Mental Status: He  is alert and oriented to person, place, and time.  Psychiatric:        Mood and Affect: Mood normal.        Behavior: Behavior normal.        Thought Content: Thought content normal.        Judgment: Judgment normal.     Results for orders placed or performed in visit on 12/01/21  CULTURE, URINE COMPREHENSIVE   Specimen: Urine   UR  Result Value Ref Range   Urine Culture, Comprehensive Final report    Organism ID, Bacteria Comment   Microscopic Examination   Urine  Result Value Ref Range   WBC, UA 0-5 0 - 5 /hpf   RBC, Urine 0-2 0 - 2 /hpf   Epithelial Cells (non renal) 0-10 0 - 10 /hpf   Bacteria, UA Few None seen/Few  Urinalysis, Complete  Result Value Ref Range   Specific Gravity, UA 1.020 1.005 - 1.030   pH, UA 5.0 5.0 - 7.5   Color, UA Yellow Yellow   Appearance Ur Clear Clear    Leukocytes,UA Negative Negative   Protein,UA Negative Negative/Trace   Glucose, UA Negative Negative   Ketones, UA Negative Negative   RBC, UA Trace (A) Negative   Bilirubin, UA Negative Negative   Urobilinogen, Ur 0.2 0.2 - 1.0 mg/dL   Nitrite, UA Negative Negative   Microscopic Examination See below:       Assessment & Plan:   Problem List Items Addressed This Visit       Cardiovascular and Mediastinum   Essential hypertension    Chronic.  Controlled.  Continue with current medication regimen.  Labs ordered today.  Return to clinic in 6 months for reevaluation.  Call sooner if concerns arise.        Relevant Medications   sildenafil (VIAGRA) 100 MG tablet     Genitourinary   Epididymitis - Primary    Will repeat urine culture at visit today.  Will treat patient based on lab results.  Follow up if symptoms do not improve.      Relevant Orders   Urinalysis, Routine w reflex microscopic   Urine Culture     Follow up plan: No follow-ups on file.

## 2021-12-22 NOTE — Assessment & Plan Note (Signed)
Chronic.  Controlled.  Continue with current medication regimen.  Labs ordered today.  Return to clinic in 6 months for reevaluation.  Call sooner if concerns arise.  ? ?

## 2021-12-24 LAB — URINE CULTURE: Organism ID, Bacteria: NO GROWTH

## 2021-12-24 NOTE — Progress Notes (Signed)
Please let patient know that his urine culture did not grow any bacteria.  He does not antibiotic treatment. If the pain persists he should follow up with the Urologist.

## 2021-12-25 ENCOUNTER — Other Ambulatory Visit: Payer: Self-pay | Admitting: Nurse Practitioner

## 2021-12-25 DIAGNOSIS — I10 Essential (primary) hypertension: Secondary | ICD-10-CM

## 2021-12-25 NOTE — Telephone Encounter (Signed)
Requested Prescriptions  Pending Prescriptions Disp Refills  . amLODipine (NORVASC) 5 MG tablet [Pharmacy Med Name: AMLODIPINE BESYLATE 5 MG TAB] 90 tablet 0    Sig: TAKE 1 TABLET BY MOUTH EVERY DAY     Cardiovascular: Calcium Channel Blockers 2 Passed - 12/25/2021  2:19 AM      Passed - Last BP in normal range    BP Readings from Last 1 Encounters:  12/22/21 129/84         Passed - Last Heart Rate in normal range    Pulse Readings from Last 1 Encounters:  12/22/21 77         Passed - Valid encounter within last 6 months    Recent Outpatient Visits          3 days ago [place], NP   1 month ago Epididymitis   [facility] Vigg, Avanti, MD   4 months ago Seasonal allergic rhinitis due to pollen   [facility] Jon Billings, NP   5 months ago Essential hypertension   [place], NP   6 months ago Hidradenitis suppurativa   [facility] Jon Billings, NP      Future Appointments            In 4 days Ralene Bathe, MD [place]   In 1 week Jon Billings, NP [facility], [place]

## 2021-12-29 ENCOUNTER — Ambulatory Visit (INDEPENDENT_AMBULATORY_CARE_PROVIDER_SITE_OTHER): Payer: Medicaid Other | Admitting: Dermatology

## 2021-12-29 ENCOUNTER — Other Ambulatory Visit: Payer: Self-pay

## 2021-12-29 VITALS — Wt 159.0 lb

## 2021-12-29 DIAGNOSIS — L853 Xerosis cutis: Secondary | ICD-10-CM

## 2021-12-29 DIAGNOSIS — L701 Acne conglobata: Secondary | ICD-10-CM

## 2021-12-29 DIAGNOSIS — Z79899 Other long term (current) drug therapy: Secondary | ICD-10-CM

## 2021-12-29 DIAGNOSIS — L732 Hidradenitis suppurativa: Secondary | ICD-10-CM

## 2021-12-29 DIAGNOSIS — L709 Acne, unspecified: Secondary | ICD-10-CM | POA: Diagnosis not present

## 2021-12-29 DIAGNOSIS — K130 Diseases of lips: Secondary | ICD-10-CM

## 2021-12-29 MED ORDER — ISOTRETINOIN 10 MG PO CAPS: 10.0000 mg | ORAL_CAPSULE | Freq: Two times a day (BID) | ORAL | 0 refills | Status: DC

## 2021-12-29 NOTE — Progress Notes (Signed)
[facility] to include Ipledge number. Escripted over

## 2021-12-29 NOTE — Patient Instructions (Signed)
Acne is Severe; chronic and persistent; not at goal. Patient is on Isotretinoin -  requiring FDA mandated monthly evaluations and laboratory monitoring.  While taking isotretinoin, do not share pills and do not donate blood. Generic isotretinoin is best absorbed when taken with a fatty meal. Isotretinoin can make you sensitive to the sun. Daily careful sun protection including sunscreen SPF 30+ when outdoors is recommended.   Recommend Cerave cream for dryness and itch   Due to recent changes in healthcare laws, you may see results of your pathology and/or laboratory studies on MyChart before the doctors have had a chance to review them. We understand that in some cases there may be results that are confusing or concerning to you. Please understand that not all results are received at the same time and often the doctors may need to interpret multiple results in order to provide you with the best plan of care or course of treatment. Therefore, we ask that you please give [place] 2 business days to thoroughly review all your results before contacting the office for clarification. Should we see a critical lab result, you will be contacted sooner.   If You Need Anything After Your Visit  If you have any questions or concerns for your doctor, please call our main line at [phone] and press option 4 to reach your doctor's medical assistant. If no one answers, please leave a voicemail as directed and we will return your call as soon as possible. Messages left after 4 pm will be answered the following business day.   You may also send [place] a message via [place]. We typically respond to MyChart messages within 1-2 business days.  For prescription refills, please ask your pharmacy to contact our office. Our fax number is [phone].  If you have an urgent issue when the clinic is closed that cannot wait until the next business day, you can page your doctor at the number below.    Please note that while we  do our best to be available for urgent issues outside of office hours, we are not available 24/7.   If you have an urgent issue and are unable to reach [place], you may choose to seek medical care at your doctor's office, retail clinic, urgent care center, or emergency room.  If you have a medical emergency, please immediately call 911 or go to the emergency department.  Pager Numbers  - Dr. Nehemiah Massed: [phone]  - Dr. Laurence Ferrari: [phone]  - Dr. Nicole Kindred: [phone]  In the event of inclement weather, please call our main line at [phone] for an update on the status of any delays or closures.  Dermatology Medication Tips: Please keep the boxes that topical medications come in in order to help keep track of the instructions about where and how to use these. Pharmacies typically print the medication instructions only on the boxes and not directly on the medication tubes.   If your medication is too expensive, please contact our office at [phone] option 4 or send [place] a message through [place].   We are unable to tell what your co-pay for medications will be in advance as this is different depending on your insurance coverage. However, we may be able to find a substitute medication at lower cost or fill out paperwork to get insurance to cover a needed medication.   If a prior authorization is required to get your medication covered by your insurance company, please allow [place] 1-2 business days to complete this process.  Drug  prices often vary depending on where the prescription is filled and some pharmacies may offer cheaper prices.  The website www.goodrx.com contains coupons for medications through different pharmacies. The prices here do not account for what the cost may be with help from insurance (it may be cheaper with your insurance), but the website can give you the price if you did not use any insurance.  - You can print the associated coupon and take it with your prescription to  the pharmacy.  - You may also stop by our office during regular business hours and pick up a GoodRx coupon card.  - If you need your prescription sent electronically to a different pharmacy, notify our office through [facility] or by phone at [phone] option 4.     Si Usted Necesita Algo Despus de Su Visita  Tambin puede enviarnos un mensaje a travs de Pharmacist, community. Por lo general respondemos a los mensajes de MyChart en el transcurso de 1 a 2 das hbiles.  Para renovar recetas, por favor pida a su farmacia que se ponga en contacto con nuestra oficina. Harland Dingwall de fax es [place] [phone].  Si tiene un asunto urgente cuando la clnica est cerrada y que no puede esperar hasta el siguiente da hbil, puede llamar/localizar a su doctor(a) al nmero que aparece a continuacin.   Por favor, tenga en cuenta que aunque hacemos todo lo posible para estar disponibles para asuntos urgentes fuera del horario de [place], no estamos disponibles las 24 horas del da, los 7 das de la [place].   Si tiene un problema urgente y no puede comunicarse con nosotros, puede optar por buscar atencin mdica  en el consultorio de su doctor(a), en una clnica privada, en un centro de atencin urgente o en una sala de emergencias.  Si tiene Engineering geologist, por favor llame inmediatamente al 911 o vaya a la sala de emergencias.  Nmeros de bper  - Dr. Nehemiah Massed: [phone]  - Dra. Moye: [phone]  - Dra. Nicole Kindred: [phone]  En caso de inclemencias del [place], por favor llame a Johnsie Kindred principal al [phone] para una actualizacin sobre el [place] de cualquier retraso o cierre.  Consejos para la medicacin en dermatologa: Por favor, guarde las cajas en las que vienen los medicamentos de uso tpico para ayudarle a seguir las instrucciones sobre dnde y cmo usarlos. Las farmacias generalmente imprimen las instrucciones del medicamento slo en las cajas y no directamente en  los tubos del [place].   Si su medicamento es muy caro, por favor, pngase en contacto con Zigmund Daniel llamando al [phone] y presione la opcin 4 o envenos un mensaje a travs de Pharmacist, community.   No podemos decirle cul ser su copago por los medicamentos por adelantado ya que esto es diferente dependiendo de la cobertura de su seguro. Sin embargo, es posible que podamos encontrar un medicamento sustituto a Electrical engineer un formulario para que el seguro cubra el medicamento que se considera necesario.   Si se requiere una autorizacin previa para que su compaa de seguros [place] su medicamento, por favor permtanos de 1 a 2 das hbiles para completar este proceso.  Los precios de los medicamentos varan con frecuencia dependiendo del Environmental consultant de dnde se surte la receta y alguna farmacias pueden ofrecer precios ms baratos.  El sitio web www.goodrx.com tiene cupones para medicamentos de Airline pilot. Los precios aqu no tienen en cuenta lo que podra costar con la ayuda del seguro (puede ser ms barato  con su seguro), pero el sitio web puede darle el precio si no Field seismologist.  - Puede imprimir el cupn correspondiente y llevarlo con su receta a la farmacia.  - Tambin puede pasar por nuestra oficina durante el horario de atencin regular y Charity fundraiser una tarjeta de cupones de GoodRx.  - Si necesita que su receta se enve electrnicamente a una farmacia diferente, informe a nuestra oficina a travs de MyChart de [place] o por telfono llamando al [phone] y presione la opcin 4.

## 2021-12-29 NOTE — Progress Notes (Signed)
   Isotretinoin Follow-Up Visit   Subjective  Caleb Brooks is a 61 y.o. male who presents for the following: Follow-up (HS - 30 day follow up- Isotretinoin 20 mg 1 po qd - decrease dose last month to 20 mg 3 times per week due to depression but patient states he is taking 1 capsule every day. His depression has not improved.).  Week # 12  Side effects: Dry skin, dry lips  Denies changes in night vision, shortness of breath, abdominal pain, nausea, vomiting, diarrhea, blood in stool or urine, visual changes, headaches, epistaxis, joint pain, myalgias, mood changes, depression, or suicidal ideation.   The following portions of the chart were reviewed this encounter and updated as appropriate: medications, allergies, medical history  Review of Systems:  No other skin or systemic complaints except as noted in HPI or Assessment and Plan.  Objective  Well appearing patient in no apparent distress; mood and affect are within normal limits.  An examination of the face, neck, chest, and back was performed and relevant findings are noted below.    Assessment & Plan   Hidradenitis suppurativa  And Acne  Conditions are severe; chronic and persistent; not at goal. Patient is on Isotretinoin -  requiring FDA mandated monthly evaluations and laboratory monitoring.   While taking isotretinoin, do not share pills and do not donate blood. Generic isotretinoin is best absorbed when taken with a fatty meal. Isotretinoin can make you sensitive to the sun. Daily careful sun protection including sunscreen SPF 30+ when outdoors is recommended.    Due to s/e of increased depression take one tab po QOD (Isotretinoin '20mg'$ ) until finished with current supply. Then decrease Isotretinoin '10mg'$  po QD. Advised patient to stop medication if any suicidal thoughts or ideations.    Ipledge[phone] [address]    Dose to date: 2400 mg  Total mg/kg:  33.29 mg/kg   isotretinoin (ACCUTANE) 10 MG capsule Take  1 capsule (10 mg total) by mouth 2 (two) times daily.  Xerosis secondary to isotretinoin therapy - Continue emollients as directed  Cheilitis secondary to isotretinoin therapy - Continue lip balm as directed, Dr. Luvenia Heller Cortibalm recommended  Long term medication management (isotretinoin) - While taking Isotretinoin and for 30 days after you finish the medication, do not share pills, do not donate blood. Isotretinoin is best absorbed when taken with a fatty meal. Isotretinoin can make you sensitive to the sun. Daily careful sun protection including sunscreen SPF 30+ when outdoors is recommended.  Follow-up in 30 days.  I, [name], CMA, am acting as scribe for [name], MD . Documentation: I have reviewed the above documentation for accuracy and completeness, and I agree with the above.  [name], MD

## 2022-01-03 ENCOUNTER — Encounter: Payer: Self-pay | Admitting: Dermatology

## 2022-01-05 ENCOUNTER — Ambulatory Visit: Payer: Medicaid Other | Admitting: Nurse Practitioner

## 2022-01-05 NOTE — Progress Notes (Deleted)
There were no vitals taken for this visit.   Subjective:    Patient ID: Caleb Brooks, male    DOB: February 28, 1961, 61 y.o.   MRN: [id number]  HPI: Caleb Brooks is a 61 y.o. male  No chief complaint on file.  HYPERTENSION Hypertension status: controlled  Satisfied with current treatment? no Duration of hypertension: years BP monitoring frequency:  not checking BP range:  BP medication side effects:  no Medication compliance: excellent compliance Previous BP meds:amlodipine Aspirin: no Recurrent headaches: no Visual changes: no Palpitations: no Dyspnea: no Chest pain: no Lower extremity edema: no Dizzy/lightheaded: no  EPIDIDYMITIS Patient states he recently had epididymitis.  He wants to make sure the infection has cleared. He is having a little bit of pain in the area.  Would like to have something for ED.   Relevant past medical, surgical, family and social history reviewed and updated as indicated. Interim medical history since our last visit reviewed. Allergies and medications reviewed and updated.  Review of Systems  Eyes:  Negative for visual disturbance.  Respiratory:  Negative for shortness of breath.   Cardiovascular:  Negative for chest pain and leg swelling.  Genitourinary:  Positive for penile pain.  Neurological:  Negative for light-headedness and headaches.    Per HPI unless specifically indicated above     Objective:    There were no vitals taken for this visit.  Wt Readings from Last 3 Encounters:  12/29/21 159 lb (72.1 kg)  12/22/21 159 lb 6.4 oz (72.3 kg)  12/01/21 162 lb (73.5 kg)    Physical Exam Vitals and nursing note reviewed.  Constitutional:      General: He is not in acute distress.    Appearance: Normal appearance. He is not ill-appearing, toxic-appearing or diaphoretic.  HENT:     Head: Normocephalic.     Right Ear: External ear normal.     Left Ear: External ear normal.     Nose: Nose normal. No congestion or rhinorrhea.      Mouth/Throat:     Mouth: Mucous membranes are moist.  Eyes:     General:        Right eye: No discharge.        Left eye: No discharge.     Extraocular Movements: Extraocular movements intact.     Conjunctiva/sclera: Conjunctivae normal.     Pupils: Pupils are equal, round, and reactive to light.  Cardiovascular:     Rate and Rhythm: Normal rate and regular rhythm.     Heart sounds: No murmur heard. Pulmonary:     Effort: Pulmonary effort is normal. No respiratory distress.     Breath sounds: Normal breath sounds. No wheezing, rhonchi or rales.  Abdominal:     General: Abdomen is flat. Bowel sounds are normal.  Musculoskeletal:     Cervical back: Normal range of motion and neck supple.  Skin:    General: Skin is warm and dry.     Capillary Refill: Capillary refill takes less than 2 seconds.  Neurological:     General: No focal deficit present.     Mental Status: He is alert and oriented to person, place, and time.  Psychiatric:        Mood and Affect: Mood normal.        Behavior: Behavior normal.        Thought Content: Thought content normal.        Judgment: Judgment normal.    Results for orders placed or  performed in visit on 12/22/21  Urine Culture   Specimen: Urine   UR  Result Value Ref Range   Urine Culture, Routine Final report    Organism ID, Bacteria No growth   Microscopic Examination   Urine  Result Value Ref Range   WBC, UA 0-5 0 - 5 /hpf   RBC, Urine None seen 0 - 2 /hpf   Epithelial Cells (non renal) 0-10 0 - 10 /hpf   Bacteria, UA None seen None seen/Few  Urinalysis, Routine w reflex microscopic  Result Value Ref Range   Specific Gravity, UA <1.005 (L) 1.005 - 1.030   pH, UA 5.5 5.0 - 7.5   Color, UA Yellow Yellow   Appearance Ur Clear Clear   Leukocytes,UA Trace (A) Negative   Protein,UA Negative Negative/Trace   Glucose, UA Negative Negative   Ketones, UA Negative Negative   RBC, UA Negative Negative   Bilirubin, UA Negative Negative    Urobilinogen, Ur 0.2 0.2 - 1.0 mg/dL   Nitrite, UA Negative Negative   Microscopic Examination See below:       Assessment & Plan:   Problem List Items Addressed This Visit      Cardiovascular and Mediastinum   Essential hypertension - Primary     Musculoskeletal and Integument   Hidradenitis suppurativa     Other   ETOH abuse     Follow up plan: No follow-ups on file.

## 2022-01-06 ENCOUNTER — Other Ambulatory Visit: Payer: Self-pay | Admitting: Nurse Practitioner

## 2022-01-06 DIAGNOSIS — I10 Essential (primary) hypertension: Secondary | ICD-10-CM

## 2022-01-07 ENCOUNTER — Ambulatory Visit: Payer: Medicaid Other | Admitting: Nurse Practitioner

## 2022-01-08 ENCOUNTER — Ambulatory Visit: Payer: Medicaid Other | Admitting: Nurse Practitioner

## 2022-01-08 NOTE — Progress Notes (Deleted)
There were no vitals taken for this visit.   Subjective:    Patient ID: Caleb Brooks, male    DOB: 09/01/1960, 61 y.o.   MRN: [id number]  HPI: Caleb Brooks is a 61 y.o. male  No chief complaint on file.  HYPERTENSION Hypertension status: controlled  Satisfied with current treatment? no Duration of hypertension: years BP monitoring frequency:  not checking BP range:  BP medication side effects:  no Medication compliance: excellent compliance Previous BP meds:amlodipine Aspirin: no Recurrent headaches: no Visual changes: no Palpitations: no Dyspnea: no Chest pain: no Lower extremity edema: no Dizzy/lightheaded: no  EPIDIDYMITIS Patient states he recently had epididymitis.  He wants to make sure the infection has cleared. He is having a little bit of pain in the area.  Would like to have something for ED.   Relevant past medical, surgical, family and social history reviewed and updated as indicated. Interim medical history since our last visit reviewed. Allergies and medications reviewed and updated.  Review of Systems  Eyes:  Negative for visual disturbance.  Respiratory:  Negative for shortness of breath.   Cardiovascular:  Negative for chest pain and leg swelling.  Genitourinary:  Positive for penile pain.  Neurological:  Negative for light-headedness and headaches.    Per HPI unless specifically indicated above     Objective:    There were no vitals taken for this visit.  Wt Readings from Last 3 Encounters:  12/29/21 159 lb (72.1 kg)  12/22/21 159 lb 6.4 oz (72.3 kg)  12/01/21 162 lb (73.5 kg)    Physical Exam Vitals and nursing note reviewed.  Constitutional:      General: He is not in acute distress.    Appearance: Normal appearance. He is not ill-appearing, toxic-appearing or diaphoretic.  HENT:     Head: Normocephalic.     Right Ear: External ear normal.     Left Ear: External ear normal.     Nose: Nose normal. No congestion or rhinorrhea.      Mouth/Throat:     Mouth: Mucous membranes are moist.  Eyes:     General:        Right eye: No discharge.        Left eye: No discharge.     Extraocular Movements: Extraocular movements intact.     Conjunctiva/sclera: Conjunctivae normal.     Pupils: Pupils are equal, round, and reactive to light.  Cardiovascular:     Rate and Rhythm: Normal rate and regular rhythm.     Heart sounds: No murmur heard. Pulmonary:     Effort: Pulmonary effort is normal. No respiratory distress.     Breath sounds: Normal breath sounds. No wheezing, rhonchi or rales.  Abdominal:     General: Abdomen is flat. Bowel sounds are normal.  Musculoskeletal:     Cervical back: Normal range of motion and neck supple.  Skin:    General: Skin is warm and dry.     Capillary Refill: Capillary refill takes less than 2 seconds.  Neurological:     General: No focal deficit present.     Mental Status: He is alert and oriented to person, place, and time.  Psychiatric:        Mood and Affect: Mood normal.        Behavior: Behavior normal.        Thought Content: Thought content normal.        Judgment: Judgment normal.    Results for orders placed or  performed in visit on 12/22/21  Urine Culture   Specimen: Urine   UR  Result Value Ref Range   Urine Culture, Routine Final report    Organism ID, Bacteria No growth   Microscopic Examination   Urine  Result Value Ref Range   WBC, UA 0-5 0 - 5 /hpf   RBC, Urine None seen 0 - 2 /hpf   Epithelial Cells (non renal) 0-10 0 - 10 /hpf   Bacteria, UA None seen None seen/Few  Urinalysis, Routine w reflex microscopic  Result Value Ref Range   Specific Gravity, UA <1.005 (L) 1.005 - 1.030   pH, UA 5.5 5.0 - 7.5   Color, UA Yellow Yellow   Appearance Ur Clear Clear   Leukocytes,UA Trace (A) Negative   Protein,UA Negative Negative/Trace   Glucose, UA Negative Negative   Ketones, UA Negative Negative   RBC, UA Negative Negative   Bilirubin, UA Negative Negative    Urobilinogen, Ur 0.2 0.2 - 1.0 mg/dL   Nitrite, UA Negative Negative   Microscopic Examination See below:       Assessment & Plan:   Problem List Items Addressed This Visit      Cardiovascular and Mediastinum   Essential hypertension - Primary     Other   ETOH abuse     Follow up plan: No follow-ups on file.

## 2022-01-08 NOTE — Telephone Encounter (Signed)
Requested medication (s) are due for refill today: no  Requested medication (s) are on the active medication list: yes  Last refill:  08/28/21 #90 1 RF  Future visit scheduled: yes  Notes to clinic:  med not assigned a protocol   Requested Prescriptions  Pending Prescriptions Disp Refills   thiamine (VITAMIN B1) 100 MG tablet [Pharmacy Med Name: VITAMIN B-1 100 MG TABLET] 90 tablet 1    Sig: TAKE 1 TABLET BY MOUTH EVERY DAY     Off-Protocol Failed - 01/06/2022  4:35 PM      Failed - Medication not assigned to a protocol, review manually.      Passed - Valid encounter within last 12 months    Recent Outpatient Visits           2 weeks ago Epididymitis   [place], NP   1 month ago Epididymitis   [facility] Vigg, Avanti, MD   5 months ago Seasonal allergic rhinitis due to pollen   [facility] Jon Billings, NP   6 months ago Essential hypertension   [place], NP   6 months ago Hidradenitis suppurativa   [facility] Jon Billings, NP       Future Appointments             Today Jon Billings, NP [facility], [place]   In 3 weeks Ralene Bathe, MD [place]

## 2022-01-16 ENCOUNTER — Ambulatory Visit: Payer: Medicaid Other | Admitting: Nurse Practitioner

## 2022-01-16 ENCOUNTER — Encounter: Payer: Self-pay | Admitting: Nurse Practitioner

## 2022-01-16 VITALS — BP 108/83 | HR 60 | Temp 98.4°F | Wt 159.6 lb

## 2022-01-16 DIAGNOSIS — F1010 Alcohol abuse, uncomplicated: Secondary | ICD-10-CM | POA: Diagnosis not present

## 2022-01-16 DIAGNOSIS — Z720 Tobacco use: Secondary | ICD-10-CM | POA: Diagnosis not present

## 2022-01-16 DIAGNOSIS — I10 Essential (primary) hypertension: Secondary | ICD-10-CM

## 2022-01-16 MED ORDER — CETIRIZINE HCL 10 MG PO TABS: 10.0000 mg | ORAL_TABLET | Freq: Every day | ORAL | 1 refills | Status: DC

## 2022-01-16 NOTE — Progress Notes (Signed)
BP 108/83   Pulse 60   Temp 98.4 F (36.9 C) (Oral)   Wt 159 lb 9.6 oz (72.4 kg)   SpO2 100%   BMI 22.27 kg/m    Subjective:    Patient ID: Caleb Brooks, male    DOB: January 21, 1961, 61 y.o.   MRN: [id number]  HPI: Caleb Brooks is a 61 y.o. male  Chief Complaint  Patient presents with   Hypertension   Hyperlipidemia   Diabetes    6 month follow up    HYPERTENSION Hypertension status: controlled  Satisfied with current treatment? no Duration of hypertension: years BP monitoring frequency:  not checking BP range:  BP medication side effects:  no Medication compliance: excellent compliance Previous BP meds:amlodipine Aspirin: no Recurrent headaches: no Visual changes: no Palpitations: no Dyspnea: no Chest pain: no Lower extremity edema: no Dizzy/lightheaded: no  ALCOHOL Patient drinks about 66 ounces of beer daily.    SMOKING Patient smokes about 1/2 ppd.     Relevant past medical, surgical, family and social history reviewed and updated as indicated. Interim medical history since our last visit reviewed. Allergies and medications reviewed and updated.  Review of Systems  Eyes:  Negative for visual disturbance.  Respiratory:  Negative for shortness of breath.   Cardiovascular:  Negative for chest pain and leg swelling.  Neurological:  Negative for light-headedness and headaches.    Per HPI unless specifically indicated above     Objective:    BP 108/83   Pulse 60   Temp 98.4 F (36.9 C) (Oral)   Wt 159 lb 9.6 oz (72.4 kg)   SpO2 100%   BMI 22.27 kg/m   Wt Readings from Last 3 Encounters:  01/16/22 159 lb 9.6 oz (72.4 kg)  12/29/21 159 lb (72.1 kg)  12/22/21 159 lb 6.4 oz (72.3 kg)    Physical Exam Vitals and nursing note reviewed.  Constitutional:      General: He is not in acute distress.    Appearance: Normal appearance. He is not ill-appearing, toxic-appearing or diaphoretic.  HENT:     Head: Normocephalic.     Right Ear: External ear  normal.     Left Ear: External ear normal.     Nose: Nose normal. No congestion or rhinorrhea.     Mouth/Throat:     Mouth: Mucous membranes are moist.  Eyes:     General:        Right eye: No discharge.        Left eye: No discharge.     Extraocular Movements: Extraocular movements intact.     Conjunctiva/sclera: Conjunctivae normal.     Pupils: Pupils are equal, round, and reactive to light.  Cardiovascular:     Rate and Rhythm: Normal rate and regular rhythm.     Heart sounds: No murmur heard. Pulmonary:     Effort: Pulmonary effort is normal. No respiratory distress.     Breath sounds: Normal breath sounds. No wheezing, rhonchi or rales.  Abdominal:     General: Abdomen is flat. Bowel sounds are normal.  Musculoskeletal:     Cervical back: Normal range of motion and neck supple.  Skin:    General: Skin is warm and dry.     Capillary Refill: Capillary refill takes less than 2 seconds.  Neurological:     General: No focal deficit present.     Mental Status: He is alert and oriented to person, place, and time.  Psychiatric:  Mood and Affect: Mood normal.        Behavior: Behavior normal.        Thought Content: Thought content normal.        Judgment: Judgment normal.     Results for orders placed or performed in visit on 12/22/21  Urine Culture   Specimen: Urine   UR  Result Value Ref Range   Urine Culture, Routine Final report    Organism ID, Bacteria No growth   Microscopic Examination   Urine  Result Value Ref Range   WBC, UA 0-5 0 - 5 /hpf   RBC, Urine None seen 0 - 2 /hpf   Epithelial Cells (non renal) 0-10 0 - 10 /hpf   Bacteria, UA None seen None seen/Few  Urinalysis, Routine w reflex microscopic  Result Value Ref Range   Specific Gravity, UA <1.005 (L) 1.005 - 1.030   pH, UA 5.5 5.0 - 7.5   Color, UA Yellow Yellow   Appearance Ur Clear Clear   Leukocytes,UA Trace (A) Negative   Protein,UA Negative Negative/Trace   Glucose, UA Negative Negative    Ketones, UA Negative Negative   RBC, UA Negative Negative   Bilirubin, UA Negative Negative   Urobilinogen, Ur 0.2 0.2 - 1.0 mg/dL   Nitrite, UA Negative Negative   Microscopic Examination See below:       Assessment & Plan:   Problem List Items Addressed This Visit       Cardiovascular and Mediastinum   Essential hypertension - Primary    Chronic.  Controlled.  Continue with current medication regimen of Amlodipine 4m.  Labs ordered today.  Return to clinic in 6 months for reevaluation.  Call sooner if concerns arise.        Relevant Orders   Comp Met (CMET)     Other   Tobacco abuse    Current everyday smoker.  Not ready to quit.      ETOH abuse    Current everyday drinking. Not ready to quit.         Follow up plan: Return in about 6 months (around 07/19/2022) for Physical and Fasting labs.

## 2022-01-16 NOTE — Assessment & Plan Note (Signed)
Current everyday drinking. Not ready to quit.

## 2022-01-16 NOTE — Assessment & Plan Note (Signed)
Current everyday smoker.  Not ready to quit.

## 2022-01-16 NOTE — Assessment & Plan Note (Signed)
Chronic.  Controlled.  Continue with current medication regimen of Amlodipine 5mg.  Labs ordered today.  Return to clinic in 6 months for reevaluation.  Call sooner if concerns arise.   

## 2022-01-17 LAB — COMPREHENSIVE METABOLIC PANEL
ALT: 8 IU/L (ref 0–44)
AST: 15 IU/L (ref 0–40)
Albumin/Globulin Ratio: 0.9 — ABNORMAL LOW (ref 1.2–2.2)
Albumin: 4 g/dL (ref 3.8–4.9)
Alkaline Phosphatase: 179 IU/L — ABNORMAL HIGH (ref 44–121)
BUN/Creatinine Ratio: 7 — ABNORMAL LOW (ref 9–20)
BUN: 7 mg/dL (ref 6–24)
Bilirubin Total: 0.3 mg/dL (ref 0.0–1.2)
CO2: 22 mmol/L (ref 20–29)
Calcium: 9.2 mg/dL (ref 8.7–10.2)
Chloride: 101 mmol/L (ref 96–106)
Creatinine, Ser: 0.98 mg/dL (ref 0.76–1.27)
Globulin, Total: 4.3 g/dL (ref 1.5–4.5)
Glucose: 83 mg/dL (ref 70–99)
Potassium: 4.1 mmol/L (ref 3.5–5.2)
Sodium: 139 mmol/L (ref 134–144)
Total Protein: 8.3 g/dL (ref 6.0–8.5)
eGFR: 89 mL/min/{1.73_m2} (ref >59)

## 2022-01-19 ENCOUNTER — Other Ambulatory Visit: Payer: Self-pay | Admitting: Nurse Practitioner

## 2022-01-19 NOTE — Telephone Encounter (Signed)
Requested Prescriptions  Pending Prescriptions Disp Refills  . celecoxib (CELEBREX) 100 MG capsule [Pharmacy Med Name: CELECOXIB 100 MG CAPSULE] 30 capsule 0    Sig: TAKE 1 CAPSULE BY MOUTH EVERY DAY     Analgesics:  COX2 Inhibitors Failed - 01/19/2022  2:27 AM      Failed - Manual Review: Labs are only required if the patient has taken medication for more than 8 weeks.      Passed - HGB in normal range and within 360 days    Hemoglobin  Date Value Ref Range Status  11/22/2021 14.1 13.0 - 17.0 g/dL Final  03/10/2021 14.2 13.0 - 17.7 g/dL Final         Passed - Cr in normal range and within 360 days    Creatinine, Ser  Date Value Ref Range Status  01/16/2022 0.98 0.76 - 1.27 mg/dL Final         Passed - HCT in normal range and within 360 days    HCT  Date Value Ref Range Status  11/22/2021 43.3 39.0 - 52.0 % Final   Hematocrit  Date Value Ref Range Status  03/10/2021 43.1 37.5 - 51.0 % Final         Passed - AST in normal range and within 360 days    AST  Date Value Ref Range Status  01/16/2022 15 0 - 40 IU/L Final         Passed - ALT in normal range and within 360 days    ALT  Date Value Ref Range Status  01/16/2022 8 0 - 44 IU/L Final         Passed - eGFR is 30 or above and within 360 days    GFR calc Af Amer  Date Value Ref Range Status  03/14/2020 81 >59 mL/min/1.73 Final    Comment:    **[facility] currently reports eGFR in compliance with the current**   recommendations of the [employer]. [facility] will   update reporting as new guidelines are published from the NKF-ASN   Task force.    GFR, Estimated  Date Value Ref Range Status  11/22/2021 >60 >60 mL/min Final    Comment:    (NOTE) Calculated using the CKD-EPI Creatinine Equation (2021)    eGFR  Date Value Ref Range Status  01/16/2022 89 >59 mL/min/1.73 Final         Passed - Patient is not pregnant      Passed - Valid encounter within last 12 months    Recent Outpatient Visits           3 days ago Essential hypertension   [facility] Jon Billings, NP   4 weeks ago Epididymitis   [place], NP   1 month ago Epididymitis   [facility] Vigg, Avanti, MD   5 months ago Seasonal allergic rhinitis due to pollen   [facility] Jon Billings, NP   6 months ago Essential hypertension   [facility] Jon Billings, NP      Future Appointments            In 2 weeks Ralene Bathe, MD [place]   In 6 months Jon Billings, NP [facility], [place]

## 2022-02-02 ENCOUNTER — Ambulatory Visit (INDEPENDENT_AMBULATORY_CARE_PROVIDER_SITE_OTHER): Payer: Medicaid Other | Admitting: Dermatology

## 2022-02-02 VITALS — Wt 159.0 lb

## 2022-02-02 DIAGNOSIS — L732 Hidradenitis suppurativa: Secondary | ICD-10-CM

## 2022-02-02 DIAGNOSIS — L708 Other acne: Secondary | ICD-10-CM | POA: Diagnosis not present

## 2022-02-02 DIAGNOSIS — Z79899 Other long term (current) drug therapy: Secondary | ICD-10-CM

## 2022-02-02 DIAGNOSIS — L853 Xerosis cutis: Secondary | ICD-10-CM | POA: Diagnosis not present

## 2022-02-02 DIAGNOSIS — K130 Diseases of lips: Secondary | ICD-10-CM

## 2022-02-02 MED ORDER — ISOTRETINOIN 10 MG PO CAPS: 10.0000 mg | ORAL_CAPSULE | Freq: Every day | ORAL | 0 refills | Status: DC

## 2022-02-02 MED ORDER — MOMETASONE FUROATE 0.1 % EX CREA: 1.0000 | TOPICAL_CREAM | Freq: Every day | CUTANEOUS | 0 refills | Status: DC | PRN

## 2022-02-02 NOTE — Progress Notes (Unsigned)
   Isotretinoin Follow-Up Visit   Subjective  Caleb Brooks is a 61 y.o. male who presents for the following: Follow-up (Hidradenitis 30 day follow up - Isotretinoin 10 mg 1 po qd).  Week # 16   Isotretinoin F/U - 02/02/22 1200       Isotretinoin Follow Up   iPledge # [id number]    Date 02/02/22    Weight 159 lb (72.1 kg)    Acne breakouts since last visit? No      Dosage   Target Dosage (mg) 14520    Current (To Date) Dosage (mg) 2700    To Go Dosage (mg) 11820      Side Effects   Skin Chapped Lips;Dry Skin    Gastrointestinal WNL    Neurological WNL    Constitutional WNL            Side effects: Dry skin, dry lips  Denies changes in night vision, shortness of breath, abdominal pain, nausea, vomiting, diarrhea, blood in stool or urine, visual changes, headaches, epistaxis, joint pain, myalgias, mood changes, depression, or suicidal ideation.   The following portions of the chart were reviewed this encounter and updated as appropriate: medications, allergies, medical history  Review of Systems:  No other skin or systemic complaints except as noted in HPI or Assessment and Plan.  Objective  Well appearing patient in no apparent distress; mood and affect are within normal limits.  An examination of the face, neck, chest, and back was performed and relevant findings are noted below.    Assessment & Plan   Hidradenitis suppurativa And Acne Patient cannot tolerate higher than 10 mg dosing daily.  He is doing well without side effects on 10 mg daily.  Conditions are severe; chronic and persistent; not at goal. Patient is on Isotretinoin -  requiring FDA mandated monthly evaluations and laboratory monitoring.   While taking isotretinoin, do not share pills and do not donate blood. Generic isotretinoin is best absorbed when taken with a fatty meal. Isotretinoin can make you sensitive to the sun. Daily careful sun protection including sunscreen SPF 30+ when outdoors is  recommended.    Continue Isotretinoin '10mg'$  po QD. Advised patient to stop medication if any suicidal thoughts or ideations.    I[facility]ge #[id number] [facility] [address]    Dose to date: 2700 mg  Total mg/kg:  37.29 mg/kg    mometasone (ELOCON) 0.1 % cream Apply 1 Application topically daily as needed (Rash).  isotretinoin (ACCUTANE) 10 MG capsule Take 1 capsule (10 mg total) by mouth daily.  Xerosis secondary to isotretinoin therapy - Continue emollients as directed  Cheilitis secondary to isotretinoin therapy - Continue lip balm as directed, Dr. Luvenia Heller Cortibalm recommended  Long term medication management (isotretinoin) - While taking Isotretinoin and for 30 days after you finish the medication, do not share pills, do not donate blood. Isotretinoin is best absorbed when taken with a fatty meal. Isotretinoin can make you sensitive to the sun. Daily careful sun protection including sunscreen SPF 30+ when outdoors is recommended.  Follow-up in 30 days.  I, [name], CMA, am acting as scribe for [name], MD . Documentation: I have reviewed the above documentation for accuracy and completeness, and I agree with the above.  [name], MD

## 2022-02-02 NOTE — Patient Instructions (Signed)
Due to recent changes in healthcare laws, you may see results of your pathology and/or laboratory studies on MyChart before the doctors have had a chance to review them. We understand that in some cases there may be results that are confusing or concerning to you. Please understand that not all results are received at the same time and often the doctors may need to interpret multiple results in order to provide you with the best plan of care or course of treatment. Therefore, we ask that you please give us 2 business days to thoroughly review all your results before contacting the office for clarification. Should we see a critical lab result, you will be contacted sooner.   If You Need Anything After Your Visit  If you have any questions or concerns for your doctor, please call our main line at [phone] and press option 4 to reach your doctor's medical assistant. If no one answers, please leave a voicemail as directed and we will return your call as soon as possible. Messages left after 4 pm will be answered the following business day.   You may also send us a message via MyChart. We typically respond to MyChart messages within 1-2 business days.  For prescription refills, please ask your pharmacy to contact our office. Our fax number is [phone].  If you have an urgent issue when the clinic is closed that cannot wait until the next business day, you can page your doctor at the number below.    Please note that while we do our best to be available for urgent issues outside of office hours, we are not available 24/7.   If you have an urgent issue and are unable to reach us, you may choose to seek medical care at your doctor's office, retail clinic, urgent care center, or emergency room.  If you have a medical emergency, please immediately call 911 or go to the emergency department.  Pager Numbers  - Dr. Kowalski: [phone]  - Dr. Moye: [phone]  - Dr. Stewart:  [phone]  In the event of inclement weather, please call our main line at [phone] for an update on the status of any delays or closures.  Dermatology Medication Tips: Please keep the boxes that topical medications come in in order to help keep track of the instructions about where and how to use these. Pharmacies typically print the medication instructions only on the boxes and not directly on the medication tubes.   If your medication is too expensive, please contact our office at [phone] option 4 or send us a message through MyChart.   We are unable to tell what your co-pay for medications will be in advance as this is different depending on your insurance coverage. However, we may be able to find a substitute medication at lower cost or fill out paperwork to get insurance to cover a needed medication.   If a prior authorization is required to get your medication covered by your insurance company, please allow us 1-2 business days to complete this process.  Drug prices often vary depending on where the prescription is filled and some pharmacies may offer cheaper prices.  The website www.goodrx.com contains coupons for medications through different pharmacies. The prices here do not account for what the cost may be with help from insurance (it may be cheaper with your insurance), but the website can give you the price if you did not use any insurance.  - You can print the associated coupon and take it with   your prescription to the pharmacy.  - You may also stop by our office during regular business hours and pick up a GoodRx coupon card.  - If you need your prescription sent electronically to a different pharmacy, notify our office through [place] MyChart or by phone at [phone] option 4.     Si Usted Necesita Algo Despus de Su Visita  Tambin puede enviarnos un mensaje a travs de MyChart. Por lo general respondemos a los mensajes de MyChart en el transcurso de 1 a 2  das hbiles.  Para renovar recetas, por favor pida a su farmacia que se ponga en contacto con nuestra oficina. Nuestro nmero de fax es el [phone].  Si tiene un asunto urgente cuando la clnica est cerrada y que no puede esperar hasta el siguiente da hbil, puede llamar/localizar a su doctor(a) al nmero que aparece a continuacin.   Por favor, tenga en cuenta que aunque hacemos todo lo posible para estar disponibles para asuntos urgentes fuera del horario de oficina, no estamos disponibles las 24 horas del da, los 7 das de la semana.   Si tiene un problema urgente y no puede comunicarse con nosotros, puede optar por buscar atencin mdica  en el consultorio de su doctor(a), en una clnica privada, en un centro de atencin urgente o en una sala de emergencias.  Si tiene una emergencia mdica, por favor llame inmediatamente al 911 o vaya a la sala de emergencias.  Nmeros de bper  - Dr. Kowalski: [phone]  - Dra. Moye: [phone]  - Dra. Stewart: [phone]  En caso de inclemencias del tiempo, por favor llame a nuestra lnea principal al [phone] para una actualizacin sobre el estado de cualquier retraso o cierre.  Consejos para la medicacin en dermatologa: Por favor, guarde las cajas en las que vienen los medicamentos de uso tpico para ayudarle a seguir las instrucciones sobre dnde y cmo usarlos. Las farmacias generalmente imprimen las instrucciones del medicamento slo en las cajas y no directamente en los tubos del medicamento.   Si su medicamento es muy caro, por favor, pngase en contacto con nuestra oficina llamando al [phone] y presione la opcin 4 o envenos un mensaje a travs de MyChart.   No podemos decirle cul ser su copago por los medicamentos por adelantado ya que esto es diferente dependiendo de la cobertura de su seguro. Sin embargo, es posible que podamos encontrar un medicamento sustituto a menor costo o llenar un formulario para que el  seguro cubra el medicamento que se considera necesario.   Si se requiere una autorizacin previa para que su compaa de seguros cubra su medicamento, por favor permtanos de 1 a 2 das hbiles para completar este proceso.  Los precios de los medicamentos varan con frecuencia dependiendo del lugar de dnde se surte la receta y alguna farmacias pueden ofrecer precios ms baratos.  El sitio web www.goodrx.com tiene cupones para medicamentos de diferentes farmacias. Los precios aqu no tienen en cuenta lo que podra costar con la ayuda del seguro (puede ser ms barato con su seguro), pero el sitio web puede darle el precio si no utiliz ningn seguro.  - Puede imprimir el cupn correspondiente y llevarlo con su receta a la farmacia.  - Tambin puede pasar por nuestra oficina durante el horario de atencin regular y recoger una tarjeta de cupones de GoodRx.  - Si necesita que su receta se enve electrnicamente a una farmacia diferente, informe a nuestra oficina a travs de MyChart de [place]   o por telfono llamando al [phone] y presione la opcin 4.  

## 2022-02-03 ENCOUNTER — Encounter: Payer: Self-pay | Admitting: Dermatology

## 2022-02-17 ENCOUNTER — Other Ambulatory Visit: Payer: Self-pay | Admitting: Nurse Practitioner

## 2022-02-18 NOTE — Telephone Encounter (Signed)
Requested Prescriptions  Pending Prescriptions Disp Refills  . celecoxib (CELEBREX) 100 MG capsule [Pharmacy Med Name: CELECOXIB 100 MG CAPSULE] 90 capsule 0    Sig: TAKE 1 CAPSULE BY MOUTH EVERY DAY     Analgesics:  COX2 Inhibitors Failed - 02/17/2022  2:23 AM      Failed - Manual Review: Labs are only required if the patient has taken medication for more than 8 weeks.      Passed - HGB in normal range and within 360 days    Hemoglobin  Date Value Ref Range Status  11/22/2021 14.1 13.0 - 17.0 g/dL Final  03/10/2021 14.2 13.0 - 17.7 g/dL Final         Passed - Cr in normal range and within 360 days    Creatinine, Ser  Date Value Ref Range Status  01/16/2022 0.98 0.76 - 1.27 mg/dL Final         Passed - HCT in normal range and within 360 days    HCT  Date Value Ref Range Status  11/22/2021 43.3 39.0 - 52.0 % Final   Hematocrit  Date Value Ref Range Status  03/10/2021 43.1 37.5 - 51.0 % Final         Passed - AST in normal range and within 360 days    AST  Date Value Ref Range Status  01/16/2022 15 0 - 40 IU/L Final         Passed - ALT in normal range and within 360 days    ALT  Date Value Ref Range Status  01/16/2022 8 0 - 44 IU/L Final         Passed - eGFR is 30 or above and within 360 days    GFR calc Af Amer  Date Value Ref Range Status  03/14/2020 81 >59 mL/min/1.73 Final    Comment:    **[facility] currently reports eGFR in compliance with the current**   recommendations of the [employer]. [facility] will   update reporting as new guidelines are published from the NKF-ASN   Task force.    GFR, Estimated  Date Value Ref Range Status  11/22/2021 >60 >60 mL/min Final    Comment:    (NOTE) Calculated using the CKD-EPI Creatinine Equation (2021)    eGFR  Date Value Ref Range Status  01/16/2022 89 >59 mL/min/1.73 Final         Passed - Patient is not pregnant      Passed - Valid encounter within last 12 months    Recent Outpatient Visits           1 month ago Essential hypertension   [place], Karen, NP   1 month ago [place], NP   2 months ago Epididymitis   [facility] Vigg, Avanti, MD   6 months ago Seasonal allergic rhinitis due to pollen   [place], NP   7 months ago Essential hypertension   [facility] Jon Billings, NP      Future Appointments            In 3 weeks Ralene Bathe, MD [place]   In 5 months Jon Billings, NP [facility] [place]

## 2022-02-19 ENCOUNTER — Other Ambulatory Visit: Payer: Self-pay | Admitting: Nurse Practitioner

## 2022-02-19 DIAGNOSIS — I10 Essential (primary) hypertension: Secondary | ICD-10-CM

## 2022-02-20 NOTE — Telephone Encounter (Signed)
Requested Prescriptions  Pending Prescriptions Disp Refills  . folic acid (FOLVITE) 1 MG tablet [Pharmacy Med Name: FOLIC ACID 1 MG TABLET] 90 tablet 1    Sig: TAKE 1 TABLET BY MOUTH EVERY DAY     Endocrinology:  Vitamins Passed - 02/19/2022  2:51 AM      Passed - Valid encounter within last 12 months    Recent Outpatient Visits          1 month ago Essential hypertension   [place], NP   2 months ago [place], NP   2 months ago Epididymitis   [facility] Vigg, Avanti, MD   6 months ago Seasonal allergic rhinitis due to pollen   [place], NP   7 months ago Essential hypertension   [facility] Jon Billings, NP      Future Appointments            In 2 weeks Ralene Bathe, MD [place]   In 5 months Jon Billings, NP [facility], [place]

## 2022-03-12 ENCOUNTER — Ambulatory Visit (INDEPENDENT_AMBULATORY_CARE_PROVIDER_SITE_OTHER): Payer: Medicaid Other | Admitting: Dermatology

## 2022-03-12 VITALS — Wt 166.0 lb

## 2022-03-12 DIAGNOSIS — K130 Diseases of lips: Secondary | ICD-10-CM | POA: Diagnosis not present

## 2022-03-12 DIAGNOSIS — L708 Other acne: Secondary | ICD-10-CM

## 2022-03-12 DIAGNOSIS — L732 Hidradenitis suppurativa: Secondary | ICD-10-CM

## 2022-03-12 DIAGNOSIS — L853 Xerosis cutis: Secondary | ICD-10-CM | POA: Diagnosis not present

## 2022-03-12 DIAGNOSIS — Z79899 Other long term (current) drug therapy: Secondary | ICD-10-CM

## 2022-03-12 MED ORDER — ISOTRETINOIN 10 MG PO CAPS: 10.0000 mg | ORAL_CAPSULE | Freq: Every day | ORAL | 0 refills | Status: DC

## 2022-03-12 NOTE — Progress Notes (Signed)
Isotretinoin Follow-Up Visit   Subjective  Caleb Brooks is a 61 y.o. male who presents for the following: hidradenitis suppurativa (Hx of HS with acne. Currently on week 20 of isotretinion 10 mg. Patient reports recently flare in the last week. Does reports constipation and some back pain. Patient is not sure if side effects are related to medication. ).  Week # 20   Isotretinoin F/U - 03/12/22 1100       Isotretinoin Follow Up   iPledge # [id number]    Date 03/12/22    Weight 166 lb (75.3 kg)    Acne breakouts since last visit? Yes      Dosage   Target Dosage (mg) 14520    Current (To Date) Dosage (mg) 3000    To Go Dosage (mg) 11520      Side Effects   Skin WNL    Gastrointestinal WNL    Neurological WNL    Constitutional Muscle/joint aches            Side effects: Dry skin, dry lips  Denies changes in night vision, shortness of breath, abdominal pain, nausea, vomiting, diarrhea, blood in stool or urine, visual changes, headaches, epistaxis, joint pain, myalgias, mood changes, depression, or suicidal ideation.   The following portions of the chart were reviewed this encounter and updated as appropriate: medications, allergies, medical history  Review of Systems:  No other skin or systemic complaints except as noted in HPI or Assessment and Plan.  Objective  Well appearing patient in no apparent distress; mood and affect are within normal limits.  An examination of the face, neck, chest, and back was performed and relevant findings are noted below.    Assessment & Plan   Hidradenitis suppurativa Pubic  With acne   Reports flares recent Patient cannot tolerate higher than 10 mg dosing daily.  He is doing well without side effects on 10 mg daily. Discussed can take every other daily    Conditions are severe; chronic and persistent; not at goal. Patient is on Isotretinoin -  requiring FDA mandated monthly evaluations and laboratory monitoring.   While  taking isotretinoin, do not share pills and do not donate blood. Generic isotretinoin is best absorbed when taken with a fatty meal. Isotretinoin can make you sensitive to the sun. Daily careful sun protection including sunscreen SPF 30+ when outdoors is recommended.    Continue Isotretinoin '10mg'$  po QD. Advised patient to stop medication if any suicidal thoughts or ideations.   Patient confirmed in iPledge and isotretinoin sent to pharmacy.    [facility] #[id number] [facility] [address]    Dose to date: 3000 mg  Total mg/kg:  39.84 mg/kg   isotretinoin (ACCUTANE) 10 MG capsule - Pubic Take 1 capsule (10 mg total) by mouth daily.  Related Medications mometasone (ELOCON) 0.1 % cream Apply 1 Application topically daily as needed (Rash).   Xerosis secondary to isotretinoin therapy - Continue emollients as directed  Cheilitis secondary to isotretinoin therapy - Continue lip balm as directed, Dr. Luvenia Heller Cortibalm recommended  Long term medication management (isotretinoin) - While taking Isotretinoin and for 30 days after you finish the medication, do not share pills, do not donate blood. Isotretinoin is best absorbed when taken with a fatty meal. Isotretinoin can make you sensitive to the sun. Daily careful sun protection including sunscreen SPF 30+ when outdoors is recommended.  Follow-up in 30 days.  Garry Heater, CMA, am acting as scribe for [name], MD. Documentation: I have  reviewed the above documentation for accuracy and completeness, and I agree with the above.  [name], MD

## 2022-03-12 NOTE — Patient Instructions (Signed)
While taking isotretinoin, do not share pills and do not donate blood. Generic isotretinoin is best absorbed when taken with a fatty meal. Isotretinoin can make you sensitive to the sun. Daily careful sun protection including sunscreen SPF 30+ when outdoors is recommended.    Due to recent changes in healthcare laws, you may see results of your pathology and/or laboratory studies on MyChart before the doctors have had a chance to review them. We understand that in some cases there may be results that are confusing or concerning to you. Please understand that not all results are received at the same time and often the doctors may need to interpret multiple results in order to provide you with the best plan of care or course of treatment. Therefore, we ask that you please give us 2 business days to thoroughly review all your results before contacting the office for clarification. Should we see a critical lab result, you will be contacted sooner.   If You Need Anything After Your Visit  If you have any questions or concerns for your doctor, please call our main line at [phone] and press option 4 to reach your doctor's medical assistant. If no one answers, please leave a voicemail as directed and we will return your call as soon as possible. Messages left after 4 pm will be answered the following business day.   You may also send us a message via MyChart. We typically respond to MyChart messages within 1-2 business days.  For prescription refills, please ask your pharmacy to contact our office. Our fax number is [phone].  If you have an urgent issue when the clinic is closed that cannot wait until the next business day, you can page your doctor at the number below.    Please note that while we do our best to be available for urgent issues outside of office hours, we are not available 24/7.   If you have an urgent issue and are unable to reach us, you may choose to seek medical care at your  doctor's office, retail clinic, urgent care center, or emergency room.  If you have a medical emergency, please immediately call 911 or go to the emergency department.  Pager Numbers  - Dr. Kowalski: [phone]  - Dr. Moye: [phone]  - Dr. Stewart: [phone]  In the event of inclement weather, please call our main line at [phone] for an update on the status of any delays or closures.  Dermatology Medication Tips: Please keep the boxes that topical medications come in in order to help keep track of the instructions about where and how to use these. Pharmacies typically print the medication instructions only on the boxes and not directly on the medication tubes.   If your medication is too expensive, please contact our office at [phone] option 4 or send us a message through MyChart.   We are unable to tell what your co-pay for medications will be in advance as this is different depending on your insurance coverage. However, we may be able to find a substitute medication at lower cost or fill out paperwork to get insurance to cover a needed medication.   If a prior authorization is required to get your medication covered by your insurance company, please allow us 1-2 business days to complete this process.  Drug prices often vary depending on where the prescription is filled and some pharmacies may offer cheaper prices.  The website www.goodrx.com contains coupons for medications through different pharmacies. The prices here do   not account for what the cost may be with help from insurance (it may be cheaper with your insurance), but the website can give you the price if you did not use any insurance.  - You can print the associated coupon and take it with your prescription to the pharmacy.  - You may also stop by our office during regular business hours and pick up a GoodRx coupon card.  - If you need your prescription sent electronically to a different pharmacy, notify  our office through [place] MyChart or by phone at [phone] option 4.     Si Usted Necesita Algo Despus de Su Visita  Tambin puede enviarnos un mensaje a travs de MyChart. Por lo general respondemos a los mensajes de MyChart en el transcurso de 1 a 2 das hbiles.  Para renovar recetas, por favor pida a su farmacia que se ponga en contacto con nuestra oficina. Nuestro nmero de fax es el [phone].  Si tiene un asunto urgente cuando la clnica est cerrada y que no puede esperar hasta el siguiente da hbil, puede llamar/localizar a su doctor(a) al nmero que aparece a continuacin.   Por favor, tenga en cuenta que aunque hacemos todo lo posible para estar disponibles para asuntos urgentes fuera del horario de oficina, no estamos disponibles las 24 horas del da, los 7 das de la semana.   Si tiene un problema urgente y no puede comunicarse con nosotros, puede optar por buscar atencin mdica  en el consultorio de su doctor(a), en una clnica privada, en un centro de atencin urgente o en una sala de emergencias.  Si tiene una emergencia mdica, por favor llame inmediatamente al 911 o vaya a la sala de emergencias.  Nmeros de bper  - Dr. Kowalski: [phone]  - Dra. Moye: [phone]  - Dra. Stewart: [phone]  En caso de inclemencias del tiempo, por favor llame a nuestra lnea principal al [phone] para una actualizacin sobre el estado de cualquier retraso o cierre.  Consejos para la medicacin en dermatologa: Por favor, guarde las cajas en las que vienen los medicamentos de uso tpico para ayudarle a seguir las instrucciones sobre dnde y cmo usarlos. Las farmacias generalmente imprimen las instrucciones del medicamento slo en las cajas y no directamente en los tubos del medicamento.   Si su medicamento es muy caro, por favor, pngase en contacto con nuestra oficina llamando al [phone] y presione la opcin 4 o envenos un mensaje a travs de  MyChart.   No podemos decirle cul ser su copago por los medicamentos por adelantado ya que esto es diferente dependiendo de la cobertura de su seguro. Sin embargo, es posible que podamos encontrar un medicamento sustituto a menor costo o llenar un formulario para que el seguro cubra el medicamento que se considera necesario.   Si se requiere una autorizacin previa para que su compaa de seguros cubra su medicamento, por favor permtanos de 1 a 2 das hbiles para completar este proceso.  Los precios de los medicamentos varan con frecuencia dependiendo del lugar de dnde se surte la receta y alguna farmacias pueden ofrecer precios ms baratos.  El sitio web www.goodrx.com tiene cupones para medicamentos de diferentes farmacias. Los precios aqu no tienen en cuenta lo que podra costar con la ayuda del seguro (puede ser ms barato con su seguro), pero el sitio web puede darle el precio si no utiliz ningn seguro.  - Puede imprimir el cupn correspondiente y llevarlo con su receta a la farmacia.  -   Tambin puede pasar por nuestra oficina durante el horario de atencin regular y Charity fundraiser una tarjeta de cupones de GoodRx.  - Si necesita que su receta se enve electrnicamente a una farmacia diferente, informe a nuestra oficina a travs de MyChart de [place] o por telfono llamando al [phone] y presione la opcin 4.

## 2022-03-17 ENCOUNTER — Encounter: Payer: Self-pay | Admitting: Dermatology

## 2022-03-24 ENCOUNTER — Other Ambulatory Visit: Payer: Self-pay | Admitting: Nurse Practitioner

## 2022-03-24 DIAGNOSIS — I10 Essential (primary) hypertension: Secondary | ICD-10-CM

## 2022-03-24 NOTE — Telephone Encounter (Signed)
Requested Prescriptions  Pending Prescriptions Disp Refills  . amLODipine (NORVASC) 5 MG tablet [Pharmacy Med Name: AMLODIPINE BESYLATE 5 MG TAB] 90 tablet 0    Sig: TAKE 1 TABLET BY MOUTH EVERY DAY     Cardiovascular: Calcium Channel Blockers 2 Passed - 03/24/2022  1:24 AM      Passed - Last BP in normal range    BP Readings from Last 1 Encounters:  01/16/22 108/83         Passed - Last Heart Rate in normal range    Pulse Readings from Last 1 Encounters:  01/16/22 60         Passed - Valid encounter within last 6 months    Recent Outpatient Visits          2 months ago Essential hypertension   [facility] Jon Billings, NP   3 months ago [place], NP   3 months ago Epididymitis   [facility] Vigg, Avanti, MD   7 months ago Seasonal allergic rhinitis due to pollen   [facility] Jon Billings, NP   8 months ago Essential hypertension   [facility] Jon Billings, NP      Future Appointments            In 3 weeks Ralene Bathe, MD [place]   In 3 months Jon Billings, NP [facility], [place]

## 2022-04-01 ENCOUNTER — Other Ambulatory Visit: Payer: Self-pay | Admitting: Dermatology

## 2022-04-01 ENCOUNTER — Other Ambulatory Visit: Payer: Self-pay | Admitting: Nurse Practitioner

## 2022-04-01 DIAGNOSIS — L732 Hidradenitis suppurativa: Secondary | ICD-10-CM

## 2022-04-01 DIAGNOSIS — I10 Essential (primary) hypertension: Secondary | ICD-10-CM

## 2022-04-02 NOTE — Telephone Encounter (Signed)
Unable to refill per protocol, Rx request is too soon. Duplicate request for amlodipine. Will refuse.  Requested Prescriptions  Pending Prescriptions Disp Refills  . amLODipine (NORVASC) 5 MG tablet [Pharmacy Med Name: AMLODIPINE BESYLATE 5 MG TAB] 90 tablet 0    Sig: TAKE 1 TABLET BY MOUTH EVERY DAY     Cardiovascular: Calcium Channel Blockers 2 Passed - 04/01/2022 10:32 AM      Passed - Last BP in normal range    BP Readings from Last 1 Encounters:  01/16/22 108/83         Passed - Last Heart Rate in normal range    Pulse Readings from Last 1 Encounters:  01/16/22 60         Passed - Valid encounter within last 6 months    Recent Outpatient Visits          2 months ago Essential hypertension   [facility] Jon Billings, NP   3 months ago [place], Karen, NP   4 months ago Epididymitis   [facility] Vigg, Avanti, MD   8 months ago Seasonal allergic rhinitis due to pollen   [place], NP   8 months ago Essential hypertension   [facility] Jon Billings, NP      Future Appointments            In 2 weeks Ralene Bathe, MD [place]   In 3 months Jon Billings, NP [facility] [place]           . celecoxib (CELEBREX) 100 MG capsule [Pharmacy Med Name: CELECOXIB 100 MG CAPSULE] 90 capsule 0    Sig: TAKE 1 CAPSULE BY MOUTH EVERY DAY     Analgesics:  COX2 Inhibitors Failed - 04/01/2022 10:32 AM      Failed - Manual Review: Labs are only required if the patient has taken medication for more than 8 weeks.      Passed - HGB in normal range and within 360 days    Hemoglobin  Date Value Ref Range Status  11/22/2021 14.1 13.0 - 17.0 g/dL Final  03/10/2021 14.2 13.0 - 17.7 g/dL Final         Passed - Cr in normal range and within 360 days    Creatinine, Ser  Date Value Ref Range Status  01/16/2022 0.98 0.76 - 1.27 mg/dL  Final         Passed - HCT in normal range and within 360 days    HCT  Date Value Ref Range Status  11/22/2021 43.3 39.0 - 52.0 % Final   Hematocrit  Date Value Ref Range Status  03/10/2021 43.1 37.5 - 51.0 % Final         Passed - AST in normal range and within 360 days    AST  Date Value Ref Range Status  01/16/2022 15 0 - 40 IU/L Final         Passed - ALT in normal range and within 360 days    ALT  Date Value Ref Range Status  01/16/2022 8 0 - 44 IU/L Final         Passed - eGFR is 30 or above and within 360 days    GFR calc Af Amer  Date Value Ref Range Status  03/14/2020 81 >59 mL/min/1.73 Final    Comment:    **[facility] currently reports eGFR in compliance with the current**   recommendations  of the National Kidney Foundation. [facility] will   update reporting as new guidelines are published from the NKF-ASN   Task force.    GFR, Estimated  Date Value Ref Range Status  11/22/2021 >60 >60 mL/min Final    Comment:    (NOTE) Calculated using the CKD-EPI Creatinine Equation (2021)    eGFR  Date Value Ref Range Status  01/16/2022 89 >59 mL/min/1.73 Final         Passed - Patient is not pregnant      Passed - Valid encounter within last 12 months    Recent Outpatient Visits          2 months ago Essential hypertension   [facility] [place], Karen, NP   3 months ago Epididymitis   [facility] Holdsworth, Karen, NP   4 months ago Epididymitis   [facility] Vigg, Avanti, MD   8 months ago Seasonal allergic rhinitis due to pollen   [facility] Hold[place], Karen, NP   8 months ago Essential hypertension   [facility] Hold[place], Karen, NP      Future Appointments            In 2 weeks Kowalski, David C, MD [facility]   In 3 months [place], Karen, NP [facility]              

## 2022-04-20 ENCOUNTER — Ambulatory Visit (INDEPENDENT_AMBULATORY_CARE_PROVIDER_SITE_OTHER): Payer: Medicaid Other | Admitting: Dermatology

## 2022-04-20 VITALS — Wt 166.0 lb

## 2022-04-20 DIAGNOSIS — K130 Diseases of lips: Secondary | ICD-10-CM

## 2022-04-20 DIAGNOSIS — L732 Hidradenitis suppurativa: Secondary | ICD-10-CM

## 2022-04-20 DIAGNOSIS — L853 Xerosis cutis: Secondary | ICD-10-CM

## 2022-04-20 DIAGNOSIS — L709 Acne, unspecified: Secondary | ICD-10-CM

## 2022-04-20 DIAGNOSIS — Z79899 Other long term (current) drug therapy: Secondary | ICD-10-CM

## 2022-04-20 MED ORDER — ISOTRETINOIN 10 MG PO CAPS: 10.0000 mg | ORAL_CAPSULE | Freq: Every day | ORAL | 0 refills | Status: DC

## 2022-04-20 NOTE — Patient Instructions (Signed)
While taking isotretinoin, do not share pills and do not donate blood. Generic isotretinoin is best absorbed when taken with a fatty meal. Isotretinoin can make you sensitive to the sun. Daily careful sun protection including sunscreen SPF 30+ when outdoors is recommended.    Due to recent changes in healthcare laws, you may see results of your pathology and/or laboratory studies on MyChart before the doctors have had a chance to review them. We understand that in some cases there may be results that are confusing or concerning to you. Please understand that not all results are received at the same time and often the doctors may need to interpret multiple results in order to provide you with the best plan of care or course of treatment. Therefore, we ask that you please give [place] 2 business days to thoroughly review all your results before contacting the office for clarification. Should we see a critical lab result, you will be contacted sooner.   If You Need Anything After Your Visit  If you have any questions or concerns for your doctor, please call our main line at [phone] and press option 4 to reach your doctor's medical assistant. If no one answers, please leave a voicemail as directed and we will return your call as soon as possible. Messages left after 4 pm will be answered the following business day.   You may also send [place] a message via [place]. We typically respond to MyChart messages within 1-2 business days.  For prescription refills, please ask your pharmacy to contact our office. Our fax number is [phone].  If you have an urgent issue when the clinic is closed that cannot wait until the next business day, you can page your doctor at the number below.    Please note that while we do our best to be available for urgent issues outside of office hours, we are not available 24/7.   If you have an urgent issue and are unable to reach [place], you may choose to seek medical care at your  doctor's office, retail clinic, urgent care center, or emergency room.  If you have a medical emergency, please immediately call 911 or go to the emergency department.  Pager Numbers  - Dr. Nehemiah Massed: [phone]  - Dr. Laurence Ferrari: [phone]  - Dr. Nicole Kindred: [phone]  In the event of inclement weather, please call our main line at [phone] for an update on the status of any delays or closures.  Dermatology Medication Tips: Please keep the boxes that topical medications come in in order to help keep track of the instructions about where and how to use these. Pharmacies typically print the medication instructions only on the boxes and not directly on the medication tubes.   If your medication is too expensive, please contact our office at [phone] option 4 or send [place] a message through [place].   We are unable to tell what your co-pay for medications will be in advance as this is different depending on your insurance coverage. However, we may be able to find a substitute medication at lower cost or fill out paperwork to get insurance to cover a needed medication.   If a prior authorization is required to get your medication covered by your insurance company, please allow [place] 1-2 business days to complete this process.  Drug prices often vary depending on where the prescription is filled and some pharmacies may offer cheaper prices.  The website www.goodrx.com contains coupons for medications through different pharmacies. The prices here do  not account for what the cost may be with help from insurance (it may be cheaper with your insurance), but the website can give you the price if you did not use any insurance.  - You can print the associated coupon and take it with your prescription to the pharmacy.  - You may also stop by our office during regular business hours and pick up a GoodRx coupon card.  - If you need your prescription sent electronically to a different pharmacy, notify  our office through [facility] or by phone at [phone] option 4.     Si Usted Necesita Algo Despus de Su Visita  Tambin puede enviarnos un mensaje a travs de Pharmacist, community. Por lo general respondemos a los mensajes de MyChart en el transcurso de 1 a 2 das hbiles.  Para renovar recetas, por favor pida a su farmacia que se ponga en contacto con nuestra oficina. Harland Dingwall de fax es [place] [phone].  Si tiene un asunto urgente cuando la clnica est cerrada y que no puede esperar hasta el siguiente da hbil, puede llamar/localizar a su doctor(a) al nmero que aparece a continuacin.   Por favor, tenga en cuenta que aunque hacemos todo lo posible para estar disponibles para asuntos urgentes fuera del horario de [place], no estamos disponibles las 24 horas del da, los 7 das de la [place].   Si tiene un problema urgente y no puede comunicarse con nosotros, puede optar por buscar atencin mdica  en el consultorio de su doctor(a), en una clnica privada, en un centro de atencin urgente o en una sala de emergencias.  Si tiene Engineering geologist, por favor llame inmediatamente al 911 o vaya a la sala de emergencias.  Nmeros de bper  - Dr. Nehemiah Massed: [phone]  - Dra. Moye: [phone]  - Dra. Nicole Kindred: [phone]  En caso de inclemencias del [place], por favor llame a [place] Kindred principal al [phone] para una actualizacin sobre el [place] de cualquier retraso o cierre.  Consejos para la medicacin en dermatologa: Por favor, guarde las cajas en las que vienen los medicamentos de uso tpico para ayudarle a seguir las instrucciones sobre dnde y cmo usarlos. Las farmacias generalmente imprimen las instrucciones del medicamento slo en las cajas y no directamente en los tubos del [place].   Si su medicamento es muy caro, por favor, pngase en contacto con Zigmund Daniel llamando al [phone] y presione la opcin 4 o envenos un mensaje a travs de  Pharmacist, community.   No podemos decirle cul ser su copago por los medicamentos por adelantado ya que esto es diferente dependiendo de la cobertura de su seguro. Sin embargo, es posible que podamos encontrar un medicamento sustituto a Electrical engineer un formulario para que el seguro cubra el medicamento que se considera necesario.   Si se requiere una autorizacin previa para que su compaa de seguros [place] su medicamento, por favor permtanos de 1 a 2 das hbiles para completar este proceso.  Los precios de los medicamentos varan con frecuencia dependiendo del Environmental consultant de dnde se surte la receta y alguna farmacias pueden ofrecer precios ms baratos.  El sitio web www.goodrx.com tiene cupones para medicamentos de Airline pilot. Los precios aqu no tienen en cuenta lo que podra costar con la ayuda del seguro (puede ser ms barato con su seguro), pero el sitio web puede darle el precio si no utiliz Research scientist (physical sciences).  - Puede imprimir el cupn correspondiente y llevarlo con su receta a la farmacia.  -  Tambin puede pasar por nuestra oficina durante el horario de atencin regular y Charity fundraiser una tarjeta de cupones de GoodRx.  - Si necesita que su receta se enve electrnicamente a una farmacia diferente, informe a nuestra oficina a travs de MyChart de [place] o por telfono llamando al [phone] y presione la opcin 4.

## 2022-04-20 NOTE — Progress Notes (Signed)
Isotretinoin Follow-Up Visit   Subjective  Caleb Brooks is a 61 y.o. male who presents for the following: Hidradenitis suppurative with acne (Taking Isotretinoin '10mg'$  daily. Patient reports areas are draining today.).  Week # 24   Isotretinoin F/U - 04/20/22 1100       Isotretinoin Follow Up   iPledge # [phone]    Date 04/20/22    Weight 166 lb (75.3 kg)    Acne breakouts since last visit? Yes      Dosage   Target Dosage (mg) 14520    Current (To Date) Dosage (mg) 3300    To Go Dosage (mg) 11220      Side Effects   Skin Chapped Lips;WNL    Gastrointestinal Diarrhea;Nausea    Neurological Headache;Depression    Constitutional Muscle/joint aches            Side effects: Dry skin, dry lips  Denies changes in night vision, shortness of breath, abdominal pain, nausea, vomiting, diarrhea, blood in stool or urine, visual changes, headaches, epistaxis, joint pain, myalgias, mood changes, depression, or suicidal ideation.   The following portions of the chart were reviewed this encounter and updated as appropriate: medications, allergies, medical history  Review of Systems:  No other skin or systemic complaints except as noted in HPI or Assessment and Plan.  Objective  Well appearing patient in no apparent distress; mood and affect are within normal limits.  An examination of the face, neck, chest, and back was performed and relevant findings are noted below.    Assessment & Plan   Hidradenitis suppurativa groin, face, neck, back  With acne    Reports flares recent Patient cannot tolerate higher than 10 mg dosing daily.  He is doing well without side effects on 10 mg daily. Discussed can take every other daily    Conditions are severe; chronic and persistent; not at goal. Patient is on Isotretinoin -  requiring FDA mandated monthly evaluations and laboratory monitoring.   While taking isotretinoin, do not share pills and do not donate blood. Generic  isotretinoin is best absorbed when taken with a fatty meal. Isotretinoin can make you sensitive to the sun. Daily careful sun protection including sunscreen SPF 30+ when outdoors is recommended.    Continue Isotretinoin '10mg'$  po QD. Advised patient to stop medication if any suicidal thoughts or ideations.    Patient confirmed in iPledge and isotretinoin sent to pharmacy.     [facility] #[id number] [facility] [address]    Dose to date: 3300 mg  Total mg/kg:  43.82 mg/kg     Related Medications mometasone (ELOCON) 0.1 % cream Apply 1 Application topically daily as needed (Rash).  isotretinoin (ACCUTANE) 10 MG capsule Take 1 capsule (10 mg total) by mouth daily.   Xerosis secondary to isotretinoin therapy - Continue emollients as directed - Xyzal (levocetirizine) once a day and fish oil 1 gram daily may also help with dryness  Cheilitis secondary to isotretinoin therapy - Continue lip balm as directed, Dr. Luvenia Heller Cortibalm recommended  Long term medication management (isotretinoin) - While taking Isotretinoin and for 30 days after you finish the medication, do not share pills, do not donate blood. Isotretinoin is best absorbed when taken with a fatty meal. Isotretinoin can make you sensitive to the sun. Daily careful sun protection including sunscreen SPF 30+ when outdoors is recommended.  Follow-up in 30 days.  I, Emelia Salisbury, CMA, am acting as scribe for [name], MD. Documentation: I have reviewed the above documentation  for accuracy and completeness, and I agree with the above.  [name], MD

## 2022-04-25 ENCOUNTER — Encounter: Payer: Self-pay | Admitting: Dermatology

## 2022-04-28 ENCOUNTER — Other Ambulatory Visit: Payer: Self-pay | Admitting: Nurse Practitioner

## 2022-04-28 NOTE — Telephone Encounter (Signed)
Too soon- Last RF 02/18/21 #90  Requested Prescriptions  Refused Prescriptions Disp Refills   celecoxib (CELEBREX) 100 MG capsule [Pharmacy Med Name: CELECOXIB 100 MG CAPSULE] 90 capsule 0    Sig: TAKE 1 CAPSULE BY MOUTH EVERY DAY     Analgesics:  COX2 Inhibitors Failed - 04/28/2022 11:49 AM      Failed - Manual Review: Labs are only required if the patient has taken medication for more than 8 weeks.      Passed - HGB in normal range and within 360 days    Hemoglobin  Date Value Ref Range Status  11/22/2021 14.1 13.0 - 17.0 g/dL Final  03/10/2021 14.2 13.0 - 17.7 g/dL Final         Passed - Cr in normal range and within 360 days    Creatinine, Ser  Date Value Ref Range Status  01/16/2022 0.98 0.76 - 1.27 mg/dL Final         Passed - HCT in normal range and within 360 days    HCT  Date Value Ref Range Status  11/22/2021 43.3 39.0 - 52.0 % Final   Hematocrit  Date Value Ref Range Status  03/10/2021 43.1 37.5 - 51.0 % Final         Passed - AST in normal range and within 360 days    AST  Date Value Ref Range Status  01/16/2022 15 0 - 40 IU/L Final         Passed - ALT in normal range and within 360 days    ALT  Date Value Ref Range Status  01/16/2022 8 0 - 44 IU/L Final         Passed - eGFR is 30 or above and within 360 days    GFR calc Af Amer  Date Value Ref Range Status  03/14/2020 81 >59 mL/min/1.73 Final    Comment:    **[facility] currently reports eGFR in compliance with the current**   recommendations of the [employer]. [facility] will   update reporting as new guidelines are published from the NKF-ASN   Task force.    GFR, Estimated  Date Value Ref Range Status  11/22/2021 >60 >60 mL/min Final    Comment:    (NOTE) Calculated using the CKD-EPI Creatinine Equation (2021)    eGFR  Date Value Ref Range Status  01/16/2022 89 >59 mL/min/1.73 Final         Passed - Patient is not pregnant      Passed - Valid encounter within last 12  months    Recent Outpatient Visits           3 months ago Essential hypertension   [place], Karen, NP   4 months ago Epididymitis   [place], NP   5 months ago Epididymitis   [facility] Vigg, Avanti, MD   8 months ago Seasonal allergic rhinitis due to pollen   [facility] Jon Billings, NP   9 months ago Essential hypertension   [facility] Jon Billings, NP       Future Appointments             In 3 weeks Ralene Bathe, MD [place]   In 2 months Jon Billings, NP [facility] [place], [place]

## 2022-05-03 ENCOUNTER — Other Ambulatory Visit: Payer: Self-pay | Admitting: Nurse Practitioner

## 2022-05-03 DIAGNOSIS — I10 Essential (primary) hypertension: Secondary | ICD-10-CM

## 2022-05-05 NOTE — Telephone Encounter (Signed)
Last ordered 03/24/22 #90 too soon  Requested Prescriptions  Refused Prescriptions Disp Refills   amLODipine (NORVASC) 5 MG tablet [Pharmacy Med Name: AMLODIPINE BESYLATE 5 MG TAB] 90 tablet 0    Sig: TAKE 1 TABLET BY MOUTH EVERY DAY     Cardiovascular: Calcium Channel Blockers 2 Passed - 05/03/2022  3:10 PM      Passed - Last BP in normal range    BP Readings from Last 1 Encounters:  01/16/22 108/83         Passed - Last Heart Rate in normal range    Pulse Readings from Last 1 Encounters:  01/16/22 60         Passed - Valid encounter within last 6 months    Recent Outpatient Visits           3 months ago Essential hypertension   [facility] Jon Billings, NP   4 months ago Epididymitis   [facility] Jon Billings, NP   5 months ago Epididymitis   [facility] Vigg, Avanti, MD   9 months ago Seasonal allergic rhinitis due to pollen   [facility] Jon Billings, NP   10 months ago Essential hypertension   [facility] Jon Billings, NP       Future Appointments             In 2 weeks Ralene Bathe, MD [place]   In 2 months Jon Billings, NP [facility], [place]

## 2022-05-20 ENCOUNTER — Ambulatory Visit (INDEPENDENT_AMBULATORY_CARE_PROVIDER_SITE_OTHER): Payer: Medicaid Other | Admitting: Dermatology

## 2022-05-20 VITALS — Wt 166.0 lb

## 2022-05-20 DIAGNOSIS — L853 Xerosis cutis: Secondary | ICD-10-CM

## 2022-05-20 DIAGNOSIS — L732 Hidradenitis suppurativa: Secondary | ICD-10-CM

## 2022-05-20 DIAGNOSIS — L701 Acne conglobata: Secondary | ICD-10-CM

## 2022-05-20 DIAGNOSIS — Z79899 Other long term (current) drug therapy: Secondary | ICD-10-CM

## 2022-05-20 DIAGNOSIS — K130 Diseases of lips: Secondary | ICD-10-CM

## 2022-05-20 MED ORDER — ISOTRETINOIN 10 MG PO CAPS: 10.0000 mg | ORAL_CAPSULE | Freq: Every day | ORAL | 0 refills | Status: DC

## 2022-05-20 NOTE — Progress Notes (Signed)
Isotretinoin Follow-Up Visit   Subjective  Caleb Brooks is a 61 y.o. male who presents for the following: Follow-up (Hidradenitis follow up - week # 28 Isotretinoin 10 mg 1 po qd).  Week # 28   Isotretinoin F/U - 05/20/22 1200       Isotretinoin Follow Up   iPledge # [phone]    Date 05/20/22    Weight 166 lb (75.3 kg)    Acne breakouts since last visit? Yes      Dosage   Target Dosage (mg) 14520    Current (To Date) Dosage (mg) 3600    To Go Dosage (mg) 10920      Side Effects   Skin Chapped Lips    Gastrointestinal Nausea    Neurological Headache;Depression    Constitutional Muscle/joint aches            Side effects: Dry skin, dry lips  Denies changes in night vision, shortness of breath, abdominal pain, nausea, vomiting, diarrhea, blood in stool or urine, visual changes, headaches, epistaxis, joint pain, myalgias, mood changes, depression, or suicidal ideation.   The following portions of the chart were reviewed this encounter and updated as appropriate: medications, allergies, medical history  Review of Systems:  No other skin or systemic complaints except as noted in HPI or Assessment and Plan.  Objective  Well appearing patient in no apparent distress; mood and affect are within normal limits.  An examination of the face, neck, chest, and back was performed and relevant findings are noted below.    Assessment & Plan   Hidradenitis suppurativa  With acne    Reports flares recent Patient cannot tolerate higher than 10 mg dosing daily.  He is doing well without side effects on 10 mg daily. Discussed can take every other daily    Conditions are severe; chronic and persistent; not at goal. Patient is on Isotretinoin -  requiring FDA mandated monthly evaluations and laboratory monitoring.   While taking isotretinoin, do not share pills and do not donate blood. Generic isotretinoin is best absorbed when taken with a fatty meal. Isotretinoin can make  you sensitive to the sun. Daily careful sun protection including sunscreen SPF 30+ when outdoors is recommended.    Continue Isotretinoin '10mg'$  po QD. Advised patient to stop medication if any suicidal thoughts or ideations.    Patient confirmed in iPledge and isotretinoin sent to pharmacy.     [facility] #[phone] [facility] [address]    Dose to date: 3600 mg  Total mg/kg:  47.80 mg/kg     isotretinoin (ACCUTANE) 10 MG capsule Take 1 capsule (10 mg total) by mouth daily.  Related Medications mometasone (ELOCON) 0.1 % cream Apply 1 Application topically daily as needed (Rash).  Xerosis secondary to isotretinoin therapy - Continue emollients as directed - Xyzal (levocetirizine) once a day and fish oil 1 gram daily may also help with dryness  Cheilitis secondary to isotretinoin therapy - Continue lip balm as directed, Dr. Luvenia Heller Cortibalm recommended  Long term medication management (isotretinoin) - While taking Isotretinoin and for 30 days after you finish the medication, do not share pills, do not donate blood. Isotretinoin is best absorbed when taken with a fatty meal. Isotretinoin can make you sensitive to the sun. Daily careful sun protection including sunscreen SPF 30+ when outdoors is recommended.  Follow-up in 30 days.  I, [name], CMA, am acting as scribe for [name], MD . Documentation: I have reviewed the above documentation for accuracy and completeness,  and I agree with the above.  [name], MD

## 2022-05-20 NOTE — Patient Instructions (Signed)
Due to recent changes in healthcare laws, you may see results of your pathology and/or laboratory studies on MyChart before the doctors have had a chance to review them. We understand that in some cases there may be results that are confusing or concerning to you. Please understand that not all results are received at the same time and often the doctors may need to interpret multiple results in order to provide you with the best plan of care or course of treatment. Therefore, we ask that you please give us 2 business days to thoroughly review all your results before contacting the office for clarification. Should we see a critical lab result, you will be contacted sooner.   If You Need Anything After Your Visit  If you have any questions or concerns for your doctor, please call our main line at [phone] and press option 4 to reach your doctor's medical assistant. If no one answers, please leave a voicemail as directed and we will return your call as soon as possible. Messages left after 4 pm will be answered the following business day.   You may also send us a message via MyChart. We typically respond to MyChart messages within 1-2 business days.  For prescription refills, please ask your pharmacy to contact our office. Our fax number is [phone].  If you have an urgent issue when the clinic is closed that cannot wait until the next business day, you can page your doctor at the number below.    Please note that while we do our best to be available for urgent issues outside of office hours, we are not available 24/7.   If you have an urgent issue and are unable to reach us, you may choose to seek medical care at your doctor's office, retail clinic, urgent care center, or emergency room.  If you have a medical emergency, please immediately call 911 or go to the emergency department.  Pager Numbers  - Dr. Kowalski: [phone]  - Dr. Moye: [phone]  - Dr. Stewart:  [phone]  In the event of inclement weather, please call our main line at [phone] for an update on the status of any delays or closures.  Dermatology Medication Tips: Please keep the boxes that topical medications come in in order to help keep track of the instructions about where and how to use these. Pharmacies typically print the medication instructions only on the boxes and not directly on the medication tubes.   If your medication is too expensive, please contact our office at [phone] option 4 or send us a message through MyChart.   We are unable to tell what your co-pay for medications will be in advance as this is different depending on your insurance coverage. However, we may be able to find a substitute medication at lower cost or fill out paperwork to get insurance to cover a needed medication.   If a prior authorization is required to get your medication covered by your insurance company, please allow us 1-2 business days to complete this process.  Drug prices often vary depending on where the prescription is filled and some pharmacies may offer cheaper prices.  The website www.goodrx.com contains coupons for medications through different pharmacies. The prices here do not account for what the cost may be with help from insurance (it may be cheaper with your insurance), but the website can give you the price if you did not use any insurance.  - You can print the associated coupon and take it with   your prescription to the pharmacy.  - You may also stop by our office during regular business hours and pick up a GoodRx coupon card.  - If you need your prescription sent electronically to a different pharmacy, notify our office through [place] MyChart or by phone at [phone] option 4.     Si Usted Necesita Algo Despus de Su Visita  Tambin puede enviarnos un mensaje a travs de MyChart. Por lo general respondemos a los mensajes de MyChart en el transcurso de 1 a 2  das hbiles.  Para renovar recetas, por favor pida a su farmacia que se ponga en contacto con nuestra oficina. Nuestro nmero de fax es el [phone].  Si tiene un asunto urgente cuando la clnica est cerrada y que no puede esperar hasta el siguiente da hbil, puede llamar/localizar a su doctor(a) al nmero que aparece a continuacin.   Por favor, tenga en cuenta que aunque hacemos todo lo posible para estar disponibles para asuntos urgentes fuera del horario de oficina, no estamos disponibles las 24 horas del da, los 7 das de la semana.   Si tiene un problema urgente y no puede comunicarse con nosotros, puede optar por buscar atencin mdica  en el consultorio de su doctor(a), en una clnica privada, en un centro de atencin urgente o en una sala de emergencias.  Si tiene una emergencia mdica, por favor llame inmediatamente al 911 o vaya a la sala de emergencias.  Nmeros de bper  - Dr. Kowalski: [phone]  - Dra. Moye: [phone]  - Dra. Stewart: [phone]  En caso de inclemencias del tiempo, por favor llame a nuestra lnea principal al [phone] para una actualizacin sobre el estado de cualquier retraso o cierre.  Consejos para la medicacin en dermatologa: Por favor, guarde las cajas en las que vienen los medicamentos de uso tpico para ayudarle a seguir las instrucciones sobre dnde y cmo usarlos. Las farmacias generalmente imprimen las instrucciones del medicamento slo en las cajas y no directamente en los tubos del medicamento.   Si su medicamento es muy caro, por favor, pngase en contacto con nuestra oficina llamando al [phone] y presione la opcin 4 o envenos un mensaje a travs de MyChart.   No podemos decirle cul ser su copago por los medicamentos por adelantado ya que esto es diferente dependiendo de la cobertura de su seguro. Sin embargo, es posible que podamos encontrar un medicamento sustituto a menor costo o llenar un formulario para que el  seguro cubra el medicamento que se considera necesario.   Si se requiere una autorizacin previa para que su compaa de seguros cubra su medicamento, por favor permtanos de 1 a 2 das hbiles para completar este proceso.  Los precios de los medicamentos varan con frecuencia dependiendo del lugar de dnde se surte la receta y alguna farmacias pueden ofrecer precios ms baratos.  El sitio web www.goodrx.com tiene cupones para medicamentos de diferentes farmacias. Los precios aqu no tienen en cuenta lo que podra costar con la ayuda del seguro (puede ser ms barato con su seguro), pero el sitio web puede darle el precio si no utiliz ningn seguro.  - Puede imprimir el cupn correspondiente y llevarlo con su receta a la farmacia.  - Tambin puede pasar por nuestra oficina durante el horario de atencin regular y recoger una tarjeta de cupones de GoodRx.  - Si necesita que su receta se enve electrnicamente a una farmacia diferente, informe a nuestra oficina a travs de MyChart de [place]   o por telfono llamando al [phone] y presione la opcin 4.  

## 2022-06-04 ENCOUNTER — Encounter: Payer: Self-pay | Admitting: Dermatology

## 2022-06-05 ENCOUNTER — Other Ambulatory Visit: Payer: Self-pay | Admitting: Dermatology

## 2022-06-05 DIAGNOSIS — L732 Hidradenitis suppurativa: Secondary | ICD-10-CM

## 2022-06-22 ENCOUNTER — Ambulatory Visit (INDEPENDENT_AMBULATORY_CARE_PROVIDER_SITE_OTHER): Payer: Medicaid Other | Admitting: Dermatology

## 2022-06-22 VITALS — Wt 166.0 lb

## 2022-06-22 DIAGNOSIS — Z79899 Other long term (current) drug therapy: Secondary | ICD-10-CM

## 2022-06-22 DIAGNOSIS — L853 Xerosis cutis: Secondary | ICD-10-CM | POA: Diagnosis not present

## 2022-06-22 DIAGNOSIS — L732 Hidradenitis suppurativa: Secondary | ICD-10-CM | POA: Diagnosis not present

## 2022-06-22 DIAGNOSIS — L709 Acne, unspecified: Secondary | ICD-10-CM | POA: Diagnosis not present

## 2022-06-22 DIAGNOSIS — K130 Diseases of lips: Secondary | ICD-10-CM

## 2022-06-22 MED ORDER — MOMETASONE FUROATE 0.1 % EX OINT: TOPICAL_OINTMENT | CUTANEOUS | 2 refills | Status: DC

## 2022-06-22 MED ORDER — ISOTRETINOIN 10 MG PO CAPS: 10.0000 mg | ORAL_CAPSULE | Freq: Every day | ORAL | 0 refills | Status: DC

## 2022-06-22 NOTE — Patient Instructions (Signed)
Due to recent changes in healthcare laws, you may see results of your pathology and/or laboratory studies on MyChart before the doctors have had a chance to review them. We understand that in some cases there may be results that are confusing or concerning to you. Please understand that not all results are received at the same time and often the doctors may need to interpret multiple results in order to provide you with the best plan of care or course of treatment. Therefore, we ask that you please give us 2 business days to thoroughly review all your results before contacting the office for clarification. Should we see a critical lab result, you will be contacted sooner.   If You Need Anything After Your Visit  If you have any questions or concerns for your doctor, please call our main line at [phone] and press option 4 to reach your doctor's medical assistant. If no one answers, please leave a voicemail as directed and we will return your call as soon as possible. Messages left after 4 pm will be answered the following business day.   You may also send us a message via MyChart. We typically respond to MyChart messages within 1-2 business days.  For prescription refills, please ask your pharmacy to contact our office. Our fax number is [phone].  If you have an urgent issue when the clinic is closed that cannot wait until the next business day, you can page your doctor at the number below.    Please note that while we do our best to be available for urgent issues outside of office hours, we are not available 24/7.   If you have an urgent issue and are unable to reach us, you may choose to seek medical care at your doctor's office, retail clinic, urgent care center, or emergency room.  If you have a medical emergency, please immediately call 911 or go to the emergency department.  Pager Numbers  - Dr. Kowalski: [phone]  - Dr. Moye: [phone]  - Dr. Stewart:  [phone]  In the event of inclement weather, please call our main line at [phone] for an update on the status of any delays or closures.  Dermatology Medication Tips: Please keep the boxes that topical medications come in in order to help keep track of the instructions about where and how to use these. Pharmacies typically print the medication instructions only on the boxes and not directly on the medication tubes.   If your medication is too expensive, please contact our office at [phone] option 4 or send us a message through MyChart.   We are unable to tell what your co-pay for medications will be in advance as this is different depending on your insurance coverage. However, we may be able to find a substitute medication at lower cost or fill out paperwork to get insurance to cover a needed medication.   If a prior authorization is required to get your medication covered by your insurance company, please allow us 1-2 business days to complete this process.  Drug prices often vary depending on where the prescription is filled and some pharmacies may offer cheaper prices.  The website www.goodrx.com contains coupons for medications through different pharmacies. The prices here do not account for what the cost may be with help from insurance (it may be cheaper with your insurance), but the website can give you the price if you did not use any insurance.  - You can print the associated coupon and take it with   your prescription to the pharmacy.  - You may also stop by our office during regular business hours and pick up a GoodRx coupon card.  - If you need your prescription sent electronically to a different pharmacy, notify our office through [facility] MyChart or by phone at [phone] option 4.     Si Usted Necesita Algo Despus de Su Visita  Tambin puede enviarnos un mensaje a travs de MyChart. Por lo general respondemos a los mensajes de MyChart en el transcurso de 1 a 2  das hbiles.  Para renovar recetas, por favor pida a su farmacia que se ponga en contacto con nuestra oficina. Nuestro nmero de fax es el [phone].  Si tiene un asunto urgente cuando la clnica est cerrada y que no puede esperar hasta el siguiente da hbil, puede llamar/localizar a su doctor(a) al nmero que aparece a continuacin.   Por favor, tenga en cuenta que aunque hacemos todo lo posible para estar disponibles para asuntos urgentes fuera del horario de oficina, no estamos disponibles las 24 horas del da, los 7 das de la semana.   Si tiene un problema urgente y no puede comunicarse con nosotros, puede optar por buscar atencin mdica  en el consultorio de su doctor(a), en una clnica privada, en un centro de atencin urgente o en una sala de emergencias.  Si tiene una emergencia mdica, por favor llame inmediatamente al 911 o vaya a la sala de emergencias.  Nmeros de bper  - Dr. Kowalski: [phone]  - Dra. Moye: [phone]  - Dra. Stewart: [phone]  En caso de inclemencias del tiempo, por favor llame a nuestra lnea principal al [phone] para una actualizacin sobre el estado de cualquier retraso o cierre.  Consejos para la medicacin en dermatologa: Por favor, guarde las cajas en las que vienen los medicamentos de uso tpico para ayudarle a seguir las instrucciones sobre dnde y cmo usarlos. Las farmacias generalmente imprimen las instrucciones del medicamento slo en las cajas y no directamente en los tubos del medicamento.   Si su medicamento es muy caro, por favor, pngase en contacto con nuestra oficina llamando al [phone] y presione la opcin 4 o envenos un mensaje a travs de MyChart.   No podemos decirle cul ser su copago por los medicamentos por adelantado ya que esto es diferente dependiendo de la cobertura de su seguro. Sin embargo, es posible que podamos encontrar un medicamento sustituto a menor costo o llenar un formulario para que el  seguro cubra el medicamento que se considera necesario.   Si se requiere una autorizacin previa para que su compaa de seguros cubra su medicamento, por favor permtanos de 1 a 2 das hbiles para completar este proceso.  Los precios de los medicamentos varan con frecuencia dependiendo del lugar de dnde se surte la receta y alguna farmacias pueden ofrecer precios ms baratos.  El sitio web www.goodrx.com tiene cupones para medicamentos de diferentes farmacias. Los precios aqu no tienen en cuenta lo que podra costar con la ayuda del seguro (puede ser ms barato con su seguro), pero el sitio web puede darle el precio si no utiliz ningn seguro.  - Puede imprimir el cupn correspondiente y llevarlo con su receta a la farmacia.  - Tambin puede pasar por nuestra oficina durante el horario de atencin regular y recoger una tarjeta de cupones de GoodRx.  - Si necesita que su receta se enve electrnicamente a una farmacia diferente, informe a nuestra oficina a travs de MyChart de [place]   o por telfono llamando al [phone] y presione la opcin 4.  

## 2022-06-22 NOTE — Progress Notes (Signed)
Isotretinoin Follow-Up Visit   Subjective  Caleb Brooks is a 62 y.o. male who presents for the following: HS (Isotretinoin 10 mg po QD with no s/e other than dryness).  Week # 32   Isotretinoin F/U - 06/22/22 1100       Isotretinoin Follow Up   iPledge # [id number]    Date 06/22/22    Weight 166 lb (75.3 kg)    Acne breakouts since last visit? Yes      Dosage   Target Dosage (mg) 14520    Current (To Date) Dosage (mg) 3900    To Go Dosage (mg) 10620      Side Effects   Skin Dry Skin    Gastrointestinal WNL    Neurological WNL    Constitutional WNL            Side effects: Dry skin, dry lips  Denies changes in night vision, shortness of breath, abdominal pain, nausea, vomiting, diarrhea, blood in stool or urine, visual changes, headaches, epistaxis, joint pain, myalgias, mood changes, depression, or suicidal ideation.   The following portions of the chart were reviewed this encounter and updated as appropriate: medications, allergies, medical history  Review of Systems:  No other skin or systemic complaints except as noted in HPI or Assessment and Plan.  Objective  Well appearing patient in no apparent distress; mood and affect are within normal limits.  An examination of the face, neck, chest, and back was performed and relevant findings are noted below.    Assessment & Plan   Hidradenitis suppurativa Face, neck, chest, and back  With acne -   Conditions are severe; chronic and persistent; not at goal. Patient is on Isotretinoin -  requiring FDA mandated monthly evaluations and laboratory monitoring.   While taking isotretinoin, do not share pills and do not donate blood. Generic isotretinoin is best absorbed when taken with a fatty meal. Isotretinoin can make you sensitive to the sun. Daily careful sun protection including sunscreen SPF 30+ when outdoors is recommended.    Continue Isotretinoin '10mg'$  po QD. Advised patient to stop medication if any  suicidal thoughts or ideations.    Patient confirmed in iPledge and isotretinoin sent to pharmacy.  [facility] #[id number] [facility] [address]    Dose to date: 3900 mg  Total mg/kg:  51.8 mg/kg   Continue Mometasone but switch to ointment due to stinging with cream. Apply to aa QD up to 5d/wk to decrease inflammation of active areas.  mometasone (ELOCON) 0.1 % ointment - Face, neck, chest, and back Apply to active inflamed lesions QD up to 5d/wk.  Related Medications isotretinoin (ACCUTANE) 10 MG capsule Take 1 capsule (10 mg total) by mouth daily.   Xerosis secondary to isotretinoin therapy - Continue emollients as directed - Xyzal (levocetirizine) once a day and fish oil 1 gram daily may also help with dryness  Cheilitis secondary to isotretinoin therapy - Continue lip balm as directed, Dr. Luvenia Heller Cortibalm recommended  Long term medication management (isotretinoin) - While taking Isotretinoin and for 30 days after you finish the medication, do not share pills, do not donate blood. Isotretinoin is best absorbed when taken with a fatty meal. Isotretinoin can make you sensitive to the sun. Daily careful sun protection including sunscreen SPF 30+ when outdoors is recommended.  Follow-up in 30 days.  [name], CMA, am acting as scribe for [name], MD . Documentation: I have reviewed the above documentation for accuracy and completeness, and I agree  with the above.  [name], MD

## 2022-06-24 ENCOUNTER — Encounter: Payer: Self-pay | Admitting: Dermatology

## 2022-06-30 ENCOUNTER — Other Ambulatory Visit: Payer: Self-pay | Admitting: Nurse Practitioner

## 2022-06-30 DIAGNOSIS — I10 Essential (primary) hypertension: Secondary | ICD-10-CM

## 2022-06-30 NOTE — Telephone Encounter (Signed)
Requested medication (s) are due for refill today: yes   Requested medication (s) are on the active medication list: yes   Last refill:  01/08/22 #90 1 refills  Future visit scheduled: yes in 2 weeks   Notes to clinic:  med not assigned to a protocol . Do you want to refill Rx?     Requested Prescriptions  Pending Prescriptions Disp Refills   thiamine (VITAMIN B1) 100 MG tablet [Pharmacy Med Name: VITAMIN B-1 100 MG TABLET] 90 tablet 1    Sig: TAKE 1 TABLET BY MOUTH EVERY DAY     Off-Protocol Failed - 06/30/2022 12:26 PM      Failed - Medication not assigned to a protocol, review manually.      Passed - Valid encounter within last 12 months    Recent Outpatient Visits           5 months ago Essential hypertension   [place], NP   6 months ago [place], NP   7 months ago [place], MD   11 months ago Seasonal allergic rhinitis due to pollen   [place], NP   11 months ago Essential hypertension   [place] Jon Billings, NP       Future Appointments             In 2 weeks Jon Billings, NP [place], [place]   In 3 weeks Ralene Bathe, MD [place]

## 2022-07-07 ENCOUNTER — Other Ambulatory Visit: Payer: Self-pay | Admitting: Nurse Practitioner

## 2022-07-07 DIAGNOSIS — I10 Essential (primary) hypertension: Secondary | ICD-10-CM

## 2022-07-08 NOTE — Telephone Encounter (Signed)
Requested Prescriptions  Pending Prescriptions Disp Refills   amLODipine (NORVASC) 5 MG tablet [Pharmacy Med Name: AMLODIPINE BESYLATE 5 MG TAB] 90 tablet 0    Sig: TAKE 1 TABLET BY MOUTH EVERY DAY     Cardiovascular: Calcium Channel Blockers 2 Passed - 07/07/2022  3:29 PM      Passed - Last BP in normal range    BP Readings from Last 1 Encounters:  01/16/22 108/83         Passed - Last Heart Rate in normal range    Pulse Readings from Last 1 Encounters:  01/16/22 60         Passed - Valid encounter within last 6 months    Recent Outpatient Visits           5 months ago Essential hypertension   [place] Jon Billings, NP   6 months ago [place] Jon Billings, NP   7 months ago [facility], [place], MD   11 months ago Seasonal allergic rhinitis due to pollen   [facility] Jon Billings, NP   1 year ago Essential hypertension   [place] Jon Billings, NP       Future Appointments             In 1 week Jon Billings, NP [place], [place]   In 2 weeks Ralene Bathe, MD [place]             celecoxib (CELEBREX) 100 MG capsule [Pharmacy Med Name: CELECOXIB 100 MG CAPSULE] 90 capsule 0    Sig: TAKE 1 CAPSULE BY MOUTH EVERY DAY     Analgesics:  COX2 Inhibitors Failed - 07/07/2022  3:29 PM      Failed - Manual Review: Labs are only required if the patient has taken medication for more than 8 weeks.      Passed - HGB in normal range and within 360 days    Hemoglobin  Date Value Ref Range Status  11/22/2021 14.1 13.0 - 17.0 g/dL Final  03/10/2021 14.2 13.0 - 17.7 g/dL Final         Passed - Cr in normal range and within 360 days    Creatinine, Ser  Date Value Ref Range Status  01/16/2022 0.98 0.76 - 1.27 mg/dL Final          Passed - HCT in normal range and within 360 days    HCT  Date Value Ref Range Status  11/22/2021 43.3 39.0 - 52.0 % Final   Hematocrit  Date Value Ref Range Status  03/10/2021 43.1 37.5 - 51.0 % Final         Passed - AST in normal range and within 360 days    AST  Date Value Ref Range Status  01/16/2022 15 0 - 40 IU/L Final         Passed - ALT in normal range and within 360 days    ALT  Date Value Ref Range Status  01/16/2022 8 0 - 44 IU/L Final         Passed - eGFR is 30 or above and within 360 days    GFR calc Af Amer  Date Value Ref Range Status  03/14/2020 81 >59 mL/min/1.73 Final    Comment:    **[facility] currently reports eGFR in compliance with the current**  recommendations of the [employer]. [facility] will   update reporting as new guidelines are published from the NKF-ASN   Task force.    GFR, Estimated  Date Value Ref Range Status  11/22/2021 >60 >60 mL/min Final    Comment:    (NOTE) Calculated using the CKD-EPI Creatinine Equation (2021)    eGFR  Date Value Ref Range Status  01/16/2022 89 >59 mL/min/1.73 Final         Passed - Patient is not pregnant      Passed - Valid encounter within last 12 months    Recent Outpatient Visits           5 months ago Essential hypertension   [place], NP   6 months ago [place], NP   7 months ago [place], MD   11 months ago Seasonal allergic rhinitis due to pollen   [facility] Jon Billings, NP   1 year ago Essential hypertension   [place] Jon Billings, NP       Future Appointments             In 1 week Jon Billings, NP [place], [place]   In 2 weeks Ralene Bathe, MD [place]

## 2022-07-20 ENCOUNTER — Encounter: Payer: Medicaid Other | Admitting: Nurse Practitioner

## 2022-07-20 NOTE — Progress Notes (Deleted)
There were no vitals taken for this visit.   Subjective:    Patient ID: Caleb Brooks, male    DOB: 05/14/1961, 62 y.o.   MRN: [id number]  HPI: Caleb Brooks is a 62 y.o. male presenting on 07/20/2022 for comprehensive medical examination. Current medical complaints include:none  He currently lives with: Interim Problems from his last visit: no  HYPERTENSION Hypertension status: controlled  Satisfied with current treatment? no Duration of hypertension: years BP monitoring frequency:  not checking BP range:  BP medication side effects:  no Medication compliance: excellent compliance Previous BP meds:amlodipine Aspirin: no Recurrent headaches: no Visual changes: no Palpitations: no Dyspnea: no Chest pain: no Lower extremity edema: no Dizzy/lightheaded: no  Depression Screen done today and results listed below:     01/16/2022    2:48 PM 12/22/2021    3:04 PM 08/04/2021    2:43 PM 07/07/2021    1:10 PM 11/19/2020   10:25 AM  Depression screen PHQ 2/9  Decreased Interest 1 2 3 3 1  $ Down, Depressed, Hopeless 1 2 3 3 1  $ PHQ - 2 Score 2 4 6 6 2  $ Altered sleeping 1 3 3 3 3  $ Tired, decreased energy 1 2 3 3 3  $ Change in appetite 1 3 3 3 3  $ Feeling bad or failure about yourself  1 3 3 3 $ 0  Trouble concentrating 0 1 3 1 $ 0  Moving slowly or fidgety/restless 1 1 3 3 $ 0  Suicidal thoughts 0 0 0 0 0  PHQ-9 Score 7 17 24 22 11  $ Difficult doing work/chores Somewhat difficult Very difficult Extremely dIfficult  Not difficult at all    The patient does not have a history of falls. I did complete a risk assessment for falls. A plan of care for falls was documented.   Past Medical History:  Past Medical History:  Diagnosis Date  . Anxiety   . Arthritis   . Gout   . Hypertension     Surgical History:  Past Surgical History:  Procedure Laterality Date  . CYST REMOVAL TRUNK     Groin area around 2000  . EMBOLIZATION N/A 04/12/2020   Procedure: EMBOLIZATION, spleen;  Surgeon:  Algernon Huxley, MD;  Location: [facility];  Service: Cardiovascular;  Laterality: N/A;  . INCISION AND DRAINAGE ABSCESS N/A 07/26/2019   Procedure: INCISION AND DRAINAGE ABSCESS;  Surgeon: Hollice Espy, MD;  Location: [facility];  Service: Urology;  Laterality: N/A;  . INCISION AND DRAINAGE ABSCESS N/A 07/26/2019   Procedure: INCISION AND DRAINAGE ABSCESS;  Surgeon: Benjamine Sprague, DO;  Location: [facility];  Service: General;  Laterality: N/A;  . LAPAROTOMY N/A 04/13/2020   Procedure: EXPLORATORY LAPAROTOMY;  Surgeon: Jules Husbands, MD;  Location: [facility];  Service: General;  Laterality: N/A;  . SPLENECTOMY, TOTAL      Medications:  Current Outpatient Medications on File Prior to Visit  Medication Sig  . amLODipine (NORVASC) 5 MG tablet TAKE 1 TABLET BY MOUTH EVERY DAY  . celecoxib (CELEBREX) 100 MG capsule TAKE 1 CAPSULE BY MOUTH EVERY DAY  . cetirizine (ZYRTEC) 10 MG tablet Take 1 tablet (10 mg total) by mouth daily.  . folic acid (FOLVITE) 1 MG tablet TAKE 1 TABLET BY MOUTH EVERY DAY  . gabapentin (NEURONTIN) 400 MG capsule TAKE 1 CAPSULE BY MOUTH 3 TIMES DAILY.  Marland Kitchen isotretinoin (ACCUTANE) 10 MG capsule Take 1 capsule (10 mg total) by mouth daily.  . mometasone (ELOCON) 0.1 %  ointment Apply to active inflamed lesions QD up to 5d/wk.  . nicotine (NICODERM CQ - DOSED IN MG/24 HOURS) 21 mg/24hr patch APPLY 1 PATCH ONTO THE SKIN EVERY DAY  . sildenafil (VIAGRA) 100 MG tablet Take 0.5-1 tablets (50-100 mg total) by mouth daily as needed for erectile dysfunction.  . thiamine (VITAMIN B1) 100 MG tablet TAKE 1 TABLET BY MOUTH EVERY DAY  . traMADol (ULTRAM) 50 MG tablet Take 1 tablet (50 mg total) by mouth every 6 (six) hours as needed.   No current facility-administered medications on file prior to visit.    Allergies:  Allergies  Allergen Reactions  . Ibuprofen Other (See Comments)    Stomach upset    Social History:  Social History   Socioeconomic History  . Marital status:  Single    Spouse name: Not on file  . Number of children: Not on file  . Years of education: Not on file  . Highest education level: Not on file  Occupational History  . Occupation: unemployed  Tobacco Use  . Smoking status: Every Day    Packs/day: 0.50    Years: 30.00    Total pack years: 15.00    Types: Cigarettes  . Smokeless tobacco: Never  . Tobacco comments:    1/2- 1 pack per day  Vaping Use  . Vaping Use: Never used  Substance and Sexual Activity  . Alcohol use: Yes    Comment: 32 oz a few days per week  . Drug use: Yes    Types: Cocaine, Marijuana    Comment: patient doesn't state how many times he uses in a week or when he last used  . Sexual activity: Not Currently  Other Topics Concern  . Not on file  Social History Narrative  . Not on file   Social Determinants of Health   Financial Resource Strain: Unknown (03/14/2020)   Overall Financial Resource Strain (CARDIA)   . Difficulty of Paying Living Expenses: Patient refused  Food Insecurity: No Food Insecurity (10/30/2020)   Hunger Vital Sign   . Worried About Charity fundraiser in the Last Year: Never true   . Ran Out of Food in the Last Year: Never true  Transportation Needs: No Transportation Needs (10/30/2020)   PRAPARE - Transportation   . Lack of Transportation (Medical): No   . Lack of Transportation (Non-Medical): No  Physical Activity: Not on file  Stress: Stress Concern Present (03/14/2020)   [place]   . Feeling of Stress : Very much  Social Connections: Not on file  Intimate Partner Violence: Not on file   Social History   Tobacco Use  Smoking Status Every Day  . Packs/day: 0.50  . Years: 30.00  . Total pack years: 15.00  . Types: Cigarettes  Smokeless Tobacco Never  Tobacco Comments   1/2- 1 pack per day   Social History   Substance and Sexual Activity  Alcohol Use Yes   Comment: 32 oz a few days per week     Family History:  Family History  Problem Relation Age of Onset  . Arthritis Mother   . Diabetes Father   . Hypertension Father   . Congestive Heart Failure Father   . Hypertension Sister   . CVA Sister   . Bipolar disorder Brother   . Schizophrenia Brother   . Cancer Brother     Past medical history, surgical history, medications, allergies, family history and social history reviewed  with patient today and changes made to appropriate areas of the chart.   Review of Systems  Eyes:  Negative for blurred vision and double vision.  Respiratory:  Negative for shortness of breath.   Cardiovascular:  Negative for chest pain, palpitations and leg swelling.  Musculoskeletal:        Boils in groin and lump under skin  Neurological:  Negative for dizziness and headaches.  All other ROS negative except what is listed above and in the HPI.      Objective:    There were no vitals taken for this visit.  Wt Readings from Last 3 Encounters:  06/22/22 166 lb (75.3 kg)  05/20/22 166 lb (75.3 kg)  04/20/22 166 lb (75.3 kg)    Physical Exam Vitals and nursing note reviewed.  Constitutional:      General: He is not in acute distress.    Appearance: Normal appearance. He is not ill-appearing, toxic-appearing or diaphoretic.  HENT:     Head: Normocephalic.     Right Ear: Tympanic membrane, ear canal and external ear normal.     Left Ear: Tympanic membrane, ear canal and external ear normal.     Nose: Nose normal. No congestion or rhinorrhea.     Mouth/Throat:     Mouth: Mucous membranes are moist.  Eyes:     General:        Right eye: No discharge.        Left eye: No discharge.     Extraocular Movements: Extraocular movements intact.     Conjunctiva/sclera: Conjunctivae normal.     Pupils: Pupils are equal, round, and reactive to light.  Cardiovascular:     Rate and Rhythm: Normal rate and regular rhythm.     Heart sounds: No murmur heard. Pulmonary:     Effort: Pulmonary  effort is normal. No respiratory distress.     Breath sounds: Normal breath sounds. No wheezing, rhonchi or rales.  Abdominal:     General: Abdomen is flat. Bowel sounds are normal. There is no distension.     Palpations: Abdomen is soft.     Tenderness: There is no abdominal tenderness. There is no guarding.  Genitourinary:   Musculoskeletal:     Cervical back: Normal range of motion and neck supple.  Skin:    General: Skin is warm and dry.     Capillary Refill: Capillary refill takes less than 2 seconds.  Neurological:     General: No focal deficit present.     Mental Status: He is alert and oriented to person, place, and time.     Cranial Nerves: No cranial nerve deficit.     Motor: No weakness.     Deep Tendon Reflexes: Reflexes normal.  Psychiatric:        Mood and Affect: Mood normal.        Behavior: Behavior normal.        Thought Content: Thought content normal.        Judgment: Judgment normal.    Results for orders placed or performed in visit on 01/16/22  Comp Met (CMET)  Result Value Ref Range   Glucose 83 70 - 99 mg/dL   BUN 7 6 - 24 mg/dL   Creatinine, Ser 0.98 0.76 - 1.27 mg/dL   eGFR 89 >59 mL/min/1.73   BUN/Creatinine Ratio 7 (L) 9 - 20   Sodium 139 134 - 144 mmol/L   Potassium 4.1 3.5 - 5.2 mmol/L   Chloride 101 96 - 106  mmol/L   CO2 22 20 - 29 mmol/L   Calcium 9.2 8.7 - 10.2 mg/dL   Total Protein 8.3 6.0 - 8.5 g/dL   Albumin 4.0 3.8 - 4.9 g/dL   Globulin, Total 4.3 1.5 - 4.5 g/dL   Albumin/Globulin Ratio 0.9 (L) 1.2 - 2.2   Bilirubin Total 0.3 0.0 - 1.2 mg/dL   Alkaline Phosphatase 179 (H) 44 - 121 IU/L   AST 15 0 - 40 IU/L   ALT 8 0 - 44 IU/L      Assessment & Plan:   Problem List Items Addressed This Visit      Cardiovascular and Mediastinum   Essential hypertension - Primary     Musculoskeletal and Integument   Hidradenitis suppurativa     Other   ETOH abuse     Discussed aspirin prophylaxis for myocardial infarction prevention  and decision was it was not indicated  LABORATORY TESTING:  Health maintenance labs ordered today as discussed above.   The natural history of prostate cancer and ongoing controversy regarding screening and potential treatment outcomes of prostate cancer has been discussed with the patient. The meaning of a false positive PSA and a false negative PSA has been discussed. He indicates understanding of the limitations of this screening test and wishes to proceed with screening PSA testing.   IMMUNIZATIONS:   - Tdap: Tetanus vaccination status reviewed: last tetanus booster within 10 years. - Influenza: Administered today - Pneumovax: Up to date - Prevnar: Up to date - HPV: Not applicable - Zostavax vaccine:  Discussed at visit today  SCREENING: - Colonoscopy: Refused  Discussed with patient purpose of the colonoscopy is to detect colon cancer at curable precancerous or early stages   - AAA Screening: Not applicable  -Hearing Test: Not applicable  -Spirometry: Not applicable   PATIENT COUNSELING:    Sexuality: Discussed sexually transmitted diseases, partner selection, use of condoms, avoidance of unintended pregnancy  and contraceptive alternatives.   Advised to avoid cigarette smoking.  I discussed with the patient that most people either abstain from alcohol or drink within safe limits (<=14/week and <=4 drinks/occasion for males, <=7/weeks and <= 3 drinks/occasion for females) and that the risk for alcohol disorders and other health effects rises proportionally with the number of drinks per week and how often a drinker exceeds daily limits.  Discussed cessation/primary prevention of drug use and availability of treatment for abuse.   Diet: Encouraged to adjust caloric intake to maintain  or achieve ideal body weight, to reduce intake of dietary saturated fat and total fat, to limit sodium intake by avoiding high sodium foods and not adding table salt, and to maintain adequate  dietary potassium and calcium preferably from fresh fruits, vegetables, and low-fat dairy products.    stressed the importance of regular exercise  Injury prevention: Discussed safety belts, safety helmets, smoke detector, smoking near bedding or upholstery.   Dental health: Discussed importance of regular tooth brushing, flossing, and dental visits.   Follow up plan: NEXT PREVENTATIVE PHYSICAL DUE IN 1 YEAR. No follow-ups on file.

## 2022-07-25 ENCOUNTER — Other Ambulatory Visit: Payer: Self-pay | Admitting: Nurse Practitioner

## 2022-07-27 ENCOUNTER — Ambulatory Visit (INDEPENDENT_AMBULATORY_CARE_PROVIDER_SITE_OTHER): Payer: Medicaid Other | Admitting: Dermatology

## 2022-07-27 VITALS — BP 120/78 | Wt 166.0 lb

## 2022-07-27 DIAGNOSIS — Z79899 Other long term (current) drug therapy: Secondary | ICD-10-CM | POA: Diagnosis not present

## 2022-07-27 DIAGNOSIS — L732 Hidradenitis suppurativa: Secondary | ICD-10-CM | POA: Diagnosis not present

## 2022-07-27 DIAGNOSIS — L709 Acne, unspecified: Secondary | ICD-10-CM

## 2022-07-27 MED ORDER — ISOTRETINOIN 10 MG PO CAPS: 10.0000 mg | ORAL_CAPSULE | Freq: Every day | ORAL | 0 refills | Status: DC

## 2022-07-27 NOTE — Progress Notes (Unsigned)
   Follow-Up Visit   Subjective  Caleb Brooks is a 62 y.o. male who presents for the following: Follow-up (Hidradenitis follow up - Isotretinoin 10 mg 1 po qd - week #36. He states he breaks out if he misses a dose/).  The following portions of the chart were reviewed this encounter and updated as appropriate:   Tobacco  Allergies  Meds  Problems  Med Hx  Surg Hx  Fam Hx     Review of Systems:  No other skin or systemic complaints except as noted in HPI or Assessment and Plan.  Objective  Well appearing patient in no apparent distress; mood and affect are within normal limits.  A focused examination was performed including face. Relevant physical exam findings are noted in the Assessment and Plan.   Assessment & Plan  Hidradenitis suppurativa With acne -on low-dose that he is tolerating well.  Cannot tolerate higher dose due to mood changes   Conditions are severe; chronic and persistent; not at goal. Patient is on Isotretinoin -  requiring FDA mandated monthly evaluations and laboratory monitoring.   While taking isotretinoin, do not share pills and do not donate blood. Generic isotretinoin is best absorbed when taken with a fatty meal. Isotretinoin can make you sensitive to the sun. Daily careful sun protection including sunscreen SPF 30+ when outdoors is recommended.    Continue Isotretinoin 10 mg po QD. Advised patient to stop medication if any suicidal thoughts or ideations.    Patient confirmed in iPledge and isotretinoin sent to pharmacy.   [facility] [id number] [facility] [address]    Dose to date: 4200 mg  Total mg/kg:  55.8 mg/kg    Continue Mometasone but switch to ointment due to stinging with cream. Apply to aa QD up to 5d/wk to decrease inflammation of active areas.  isotretinoin (ACCUTANE) 10 MG capsule Take 1 capsule (10 mg total) by mouth daily.  Return in about 30 days (around 08/26/2022) for Isotretinoin.  I, [name], CMA, am acting as scribe for [name], MD . Documentation: I have reviewed the above documentation for accuracy and completeness, and I agree with the above.  [name], MD

## 2022-07-27 NOTE — Telephone Encounter (Signed)
Requested Prescriptions  Pending Prescriptions Disp Refills   cetirizine (ZYRTEC) 10 MG tablet [Pharmacy Med Name: CETIRIZINE HCL 10 MG TABLET] 90 tablet 1    Sig: TAKE 1 TABLET BY MOUTH EVERY DAY     Ear, Nose, and Throat:  Antihistamines 2 Passed - 07/25/2022  8:35 AM      Passed - Cr in normal range and within 360 days    Creatinine, Ser  Date Value Ref Range Status  01/16/2022 0.98 0.76 - 1.27 mg/dL Final         Passed - Valid encounter within last 12 months    Recent Outpatient Visits           6 months ago Essential hypertension   [place], NP   7 months ago [place], NP   8 months ago [place], MD   11 months ago Seasonal allergic rhinitis due to pollen   [facility] Jon Bill[place], NP   1 year ago Essential hypertension   [place] Jon Billings, NP       Future Appointments             Today Ralene Bathe, [place]

## 2022-07-27 NOTE — Patient Instructions (Signed)
Acne is Severe; chronic and persistent; not at goal. Patient is on Isotretinoin -  requiring FDA mandated monthly evaluations and laboratory monitoring.  While taking isotretinoin, do not share pills and do not donate blood. Generic isotretinoin is best absorbed when taken with a fatty meal. Isotretinoin can make you sensitive to the sun. Daily careful sun protection including sunscreen SPF 30+ when outdoors is recommended.     Due to recent changes in healthcare laws, you may see results of your pathology and/or laboratory studies on MyChart before the doctors have had a chance to review them. We understand that in some cases there may be results that are confusing or concerning to you. Please understand that not all results are received at the same time and often the doctors may need to interpret multiple results in order to provide you with the best plan of care or course of treatment. Therefore, we ask that you please give [place] 2 business days to thoroughly review all your results before contacting the office for clarification. Should we see a critical lab result, you will be contacted sooner.   If You Need Anything After Your Visit  If you have any questions or concerns for your doctor, please call our main line at [phone] and press option 4 to reach your doctor's medical assistant. If no one answers, please leave a voicemail as directed and we will return your call as soon as possible. Messages left after 4 pm will be answered the following business day.   You may also send [place] a message via [place]. We typically respond to MyChart messages within 1-2 business days.  For prescription refills, please ask your pharmacy to contact our office. Our fax number is [phone].  If you have an urgent issue when the clinic is closed that cannot wait until the next business day, you can page your doctor at the number below.    Please note that while we do our best to be available for urgent issues  outside of office hours, we are not available 24/7.   If you have an urgent issue and are unable to reach [place], you may choose to seek medical care at your doctor's office, retail clinic, urgent care center, or emergency room.  If you have a medical emergency, please immediately call 911 or go to the emergency department.  Pager Numbers  - Dr. Nehemiah Massed: [phone]  - Dr. Laurence Ferrari: [phone]  - Dr. Nicole Kindred: [phone]  In the event of inclement weather, please call our main line at [phone] for an update on the status of any delays or closures.  Dermatology Medication Tips: Please keep the boxes that topical medications come in in order to help keep track of the instructions about where and how to use these. Pharmacies typically print the medication instructions only on the boxes and not directly on the medication tubes.   If your medication is too expensive, please contact our office at [phone] option 4 or send [place] a message through [place].   We are unable to tell what your co-pay for medications will be in advance as this is different depending on your insurance coverage. However, we may be able to find a substitute medication at lower cost or fill out paperwork to get insurance to cover a needed medication.   If a prior authorization is required to get your medication covered by your insurance company, please allow [place] 1-2 business days to complete this process.  Drug prices often vary depending on where the  prescription is filled and some pharmacies may offer cheaper prices.  The website www.goodrx.com contains coupons for medications through different pharmacies. The prices here do not account for what the cost may be with help from insurance (it may be cheaper with your insurance), but the website can give you the price if you did not use any insurance.  - You can print the associated coupon and take it with your prescription to the pharmacy.  - You may also stop by our  office during regular business hours and pick up a GoodRx coupon card.  - If you need your prescription sent electronically to a different pharmacy, notify our office through [facility] or by phone at [phone] option 4.     Si Usted Necesita Algo Despus de Su Visita  Tambin puede enviarnos un mensaje a travs de Pharmacist, community. Por lo general respondemos a los mensajes de MyChart en el transcurso de 1 a 2 das hbiles.  Para renovar recetas, por favor pida a su farmacia que se ponga en contacto con nuestra oficina. Harland Dingwall de fax es [place] [phone].  Si tiene un asunto urgente cuando la clnica est cerrada y que no puede esperar hasta el siguiente da hbil, puede llamar/localizar a su doctor(a) al nmero que aparece a continuacin.   Por favor, tenga en cuenta que aunque hacemos todo lo posible para estar disponibles para asuntos urgentes fuera del horario de [place], no estamos disponibles las 24 horas del da, los 7 das de la [place].   Si tiene un problema urgente y no puede comunicarse con nosotros, puede optar por buscar atencin mdica  en el consultorio de su doctor(a), en una clnica privada, en un centro de atencin urgente o en una sala de emergencias.  Si tiene Engineering geologist, por favor llame inmediatamente al 911 o vaya a la sala de emergencias.  Nmeros de bper  - Dr. Nehemiah Massed: [phone]  - Dra. Moye: [phone]  - Dra. Nicole Kindred: [phone]  En caso de inclemencias del [place], por favor llame a Johnsie Kindred principal al [phone] para una actualizacin sobre el [place] de cualquier retraso o cierre.  Consejos para la medicacin en dermatologa: Por favor, guarde las cajas en las que vienen los medicamentos de uso tpico para ayudarle a seguir las instrucciones sobre dnde y cmo usarlos. Las farmacias generalmente imprimen las instrucciones del medicamento slo en las cajas y no directamente en los tubos del [place].   Si su  medicamento es muy caro, por favor, pngase en contacto con Zigmund Daniel llamando al [phone] y presione la opcin 4 o envenos un mensaje a travs de Pharmacist, community.   No podemos decirle cul ser su copago por los medicamentos por adelantado ya que esto es diferente dependiendo de la cobertura de su seguro. Sin embargo, es posible que podamos encontrar un medicamento sustituto a Electrical engineer un formulario para que el seguro cubra el medicamento que se considera necesario.   Si se requiere una autorizacin previa para que su compaa de seguros [place] su medicamento, por favor permtanos de 1 a 2 das hbiles para completar este proceso.  Los precios de los medicamentos varan con frecuencia dependiendo del Environmental consultant de dnde se surte la receta y alguna farmacias pueden ofrecer precios ms baratos.  El sitio web www.goodrx.com tiene cupones para medicamentos de Airline pilot. Los precios aqu no tienen en cuenta lo que podra costar con la ayuda del seguro (puede ser ms barato con su seguro), pero el sitio web  puede darle el precio si no utiliz [employer].  - Puede imprimir el cupn correspondiente y llevarlo con su receta a la farmacia.  - Tambin puede pasar por nuestra oficina durante el horario de atencin regular y Charity fundraiser una tarjeta de cupones de GoodRx.  - Si necesita que su receta se enve electrnicamente a una farmacia diferente, informe a nuestra oficina a travs de MyChart de [place] o por telfono llamando al [phone] y presione la opcin 4.

## 2022-07-29 ENCOUNTER — Encounter: Payer: Self-pay | Admitting: Dermatology

## 2022-08-17 ENCOUNTER — Ambulatory Visit: Payer: Medicaid Other | Admitting: Nurse Practitioner

## 2022-08-17 NOTE — Progress Notes (Deleted)
There were no vitals taken for this visit.   Subjective:    Patient ID: Caleb Brooks, male    DOB: February 28, 1961, 62 y.o.   MRN: [id number]  HPI: Caleb Brooks is a 62 y.o. male presenting on 08/17/2022 for comprehensive medical examination. Current medical complaints include:none  He currently lives with: Interim Problems from his last visit: no  HYPERTENSION Hypertension status: controlled  Satisfied with current treatment? no Duration of hypertension: years BP monitoring frequency:  not checking BP range:  BP medication side effects:  no Medication compliance: excellent compliance Previous BP meds:amlodipine Aspirin: no Recurrent headaches: no Visual changes: no Palpitations: no Dyspnea: no Chest pain: no Lower extremity edema: no Dizzy/lightheaded: no  Depression Screen done today and results listed below:     01/16/2022    2:48 PM 12/22/2021    3:04 PM 08/04/2021    2:43 PM 07/07/2021    1:10 PM 11/19/2020   10:25 AM  Depression screen PHQ 2/9  Decreased Interest '1 2 3 3 1  '$ Down, Depressed, Hopeless '1 2 3 3 1  '$ PHQ - 2 Score '2 4 6 6 2  '$ Altered sleeping '1 3 3 3 3  '$ Tired, decreased energy '1 2 3 3 3  '$ Change in appetite '1 3 3 3 3  '$ Feeling bad or failure about yourself  '1 3 3 3 '$ 0  Trouble concentrating 0 '1 3 1 '$ 0  Moving slowly or fidgety/restless '1 1 3 3 '$ 0  Suicidal thoughts 0 0 0 0 0  PHQ-9 Score '7 17 24 22 11  '$ Difficult doing work/chores Somewhat difficult Very difficult Extremely dIfficult  Not difficult at all    The patient does not have a history of falls. I did complete a risk assessment for falls. A plan of care for falls was documented.   Past Medical History:  Past Medical History:  Diagnosis Date  . Anxiety   . Arthritis   . Gout   . Hypertension     Surgical History:  Past Surgical History:  Procedure Laterality Date  . CYST REMOVAL TRUNK     Groin area around 2000  . EMBOLIZATION N/A 04/12/2020   Procedure: EMBOLIZATION, spleen;  Surgeon:  Algernon Huxley, MD;  Location: [facility];  Service: Cardiovascular;  Laterality: N/A;  . INCISION AND DRAINAGE ABSCESS N/A 07/26/2019   Procedure: INCISION AND DRAINAGE ABSCESS;  Surgeon: Hollice Espy, MD;  Location: [facility];  Service: Urology;  Laterality: N/A;  . INCISION AND DRAINAGE ABSCESS N/A 07/26/2019   Procedure: INCISION AND DRAINAGE ABSCESS;  Surgeon: Benjamine Sprague, DO;  Location: [facility];  Service: General;  Laterality: N/A;  . LAPAROTOMY N/A 04/13/2020   Procedure: EXPLORATORY LAPAROTOMY;  Surgeon: Jules Husbands, MD;  Location: [facility];  Service: General;  Laterality: N/A;  . SPLENECTOMY, TOTAL      Medications:  Current Outpatient Medications on File Prior to Visit  Medication Sig  . amLODipine (NORVASC) 5 MG tablet TAKE 1 TABLET BY MOUTH EVERY DAY  . celecoxib (CELEBREX) 100 MG capsule TAKE 1 CAPSULE BY MOUTH EVERY DAY  . cetirizine (ZYRTEC) 10 MG tablet TAKE 1 TABLET BY MOUTH EVERY DAY  . folic acid (FOLVITE) 1 MG tablet TAKE 1 TABLET BY MOUTH EVERY DAY  . gabapentin (NEURONTIN) 400 MG capsule TAKE 1 CAPSULE BY MOUTH 3 TIMES DAILY.  Marland Kitchen isotretinoin (ACCUTANE) 10 MG capsule Take 1 capsule (10 mg total) by mouth daily.  . nicotine (NICODERM CQ - DOSED IN  MG/24 HOURS) 21 mg/24hr patch APPLY 1 PATCH ONTO THE SKIN EVERY DAY  . sildenafil (VIAGRA) 100 MG tablet Take 0.5-1 tablets (50-100 mg total) by mouth daily as needed for erectile dysfunction.  . thiamine (VITAMIN B1) 100 MG tablet TAKE 1 TABLET BY MOUTH EVERY DAY  . traMADol (ULTRAM) 50 MG tablet Take 1 tablet (50 mg total) by mouth every 6 (six) hours as needed.   No current facility-administered medications on file prior to visit.    Allergies:  Allergies  Allergen Reactions  . Ibuprofen Other (See Comments)    Stomach upset    Social History:  Social History   Socioeconomic History  . Marital status: Single    Spouse name: Not on file  . Number of children: Not on file  . Years of education: Not  on file  . Highest education level: Not on file  Occupational History  . Occupation: unemployed  Tobacco Use  . Smoking status: Every Day    Packs/day: 0.50    Years: 30.00    Total pack years: 15.00    Types: Cigarettes  . Smokeless tobacco: Never  . Tobacco comments:    1/2- 1 pack per day  Vaping Use  . Vaping Use: Never used  Substance and Sexual Activity  . Alcohol use: Yes    Comment: 32 oz a few days per week  . Drug use: Yes    Types: Cocaine, Marijuana    Comment: patient doesn't state how many times he uses in a week or when he last used  . Sexual activity: Not Currently  Other Topics Concern  . Not on file  Social History Narrative  . Not on file   Social Determinants of Health   Financial Resource Strain: Unknown (03/14/2020)   Overall Financial Resource Strain (CARDIA)   . Difficulty of Paying Living Expenses: Patient refused  Food Insecurity: No Food Insecurity (10/30/2020)   Hunger Vital Sign   . Worried About Charity fundraiser in the Last Year: Never true   . Ran Out of Food in the Last Year: Never true  Transportation Needs: No Transportation Needs (10/30/2020)   PRAPARE - Transportation   . Lack of Transportation (Medical): No   . Lack of Transportation (Non-Medical): No  Physical Activity: Not on file  Stress: Stress Concern Present (03/14/2020)   [place]   . Feeling of Stress : Very much  Social Connections: Not on file  Intimate Partner Violence: Not on file   Social History   Tobacco Use  Smoking Status Every Day  . Packs/day: 0.50  . Years: 30.00  . Total pack years: 15.00  . Types: Cigarettes  Smokeless Tobacco Never  Tobacco Comments   1/2- 1 pack per day   Social History   Substance and Sexual Activity  Alcohol Use Yes   Comment: 32 oz a few days per week    Family History:  Family History  Problem Relation Age of Onset  . Arthritis Mother   . Diabetes  Father   . Hypertension Father   . Congestive Heart Failure Father   . Hypertension Sister   . CVA Sister   . Bipolar disorder Brother   . Schizophrenia Brother   . Cancer Brother     Past medical history, surgical history, medications, allergies, family history and social history reviewed with patient today and changes made to appropriate areas of the chart.   Review of Systems  Eyes:  Negative for blurred vision and double vision.  Respiratory:  Negative for shortness of breath.   Cardiovascular:  Negative for chest pain, palpitations and leg swelling.  Musculoskeletal:        Boils in groin and lump under skin  Neurological:  Negative for dizziness and headaches.  All other ROS negative except what is listed above and in the HPI.      Objective:    There were no vitals taken for this visit.  Wt Readings from Last 3 Encounters:  07/27/22 166 lb (75.3 kg)  06/22/22 166 lb (75.3 kg)  05/20/22 166 lb (75.3 kg)    Physical Exam Vitals and nursing note reviewed.  Constitutional:      General: He is not in acute distress.    Appearance: Normal appearance. He is not ill-appearing, toxic-appearing or diaphoretic.  HENT:     Head: Normocephalic.     Right Ear: Tympanic membrane, ear canal and external ear normal.     Left Ear: Tympanic membrane, ear canal and external ear normal.     Nose: Nose normal. No congestion or rhinorrhea.     Mouth/Throat:     Mouth: Mucous membranes are moist.  Eyes:     General:        Right eye: No discharge.        Left eye: No discharge.     Extraocular Movements: Extraocular movements intact.     Conjunctiva/sclera: Conjunctivae normal.     Pupils: Pupils are equal, round, and reactive to light.  Cardiovascular:     Rate and Rhythm: Normal rate and regular rhythm.     Heart sounds: No murmur heard. Pulmonary:     Effort: Pulmonary effort is normal. No respiratory distress.     Breath sounds: Normal breath sounds. No wheezing, rhonchi or  rales.  Abdominal:     General: Abdomen is flat. Bowel sounds are normal. There is no distension.     Palpations: Abdomen is soft.     Tenderness: There is no abdominal tenderness. There is no guarding.  Genitourinary:   Musculoskeletal:     Cervical back: Normal range of motion and neck supple.  Skin:    General: Skin is warm and dry.     Capillary Refill: Capillary refill takes less than 2 seconds.  Neurological:     General: No focal deficit present.     Mental Status: He is alert and oriented to person, place, and time.     Cranial Nerves: No cranial nerve deficit.     Motor: No weakness.     Deep Tendon Reflexes: Reflexes normal.  Psychiatric:        Mood and Affect: Mood normal.        Behavior: Behavior normal.        Thought Content: Thought content normal.        Judgment: Judgment normal.    Results for orders placed or performed in visit on 01/16/22  Comp Met (CMET)  Result Value Ref Range   Glucose 83 70 - 99 mg/dL   BUN 7 6 - 24 mg/dL   Creatinine, Ser 0.98 0.76 - 1.27 mg/dL   eGFR 89 >59 mL/min/1.73   BUN/Creatinine Ratio 7 (L) 9 - 20   Sodium 139 134 - 144 mmol/L   Potassium 4.1 3.5 - 5.2 mmol/L   Chloride 101 96 - 106 mmol/L   CO2 22 20 - 29 mmol/L   Calcium 9.2 8.7 - 10.2 mg/dL  Total Protein 8.3 6.0 - 8.5 g/dL   Albumin 4.0 3.8 - 4.9 g/dL   Globulin, Total 4.3 1.5 - 4.5 g/dL   Albumin/Globulin Ratio 0.9 (L) 1.2 - 2.2   Bilirubin Total 0.3 0.0 - 1.2 mg/dL   Alkaline Phosphatase 179 (H) 44 - 121 IU/L   AST 15 0 - 40 IU/L   ALT 8 0 - 44 IU/L      Assessment & Plan:   Problem List Items Addressed This Visit      Cardiovascular and Mediastinum   Essential hypertension - Primary     Other   ETOH abuse     Discussed aspirin prophylaxis for myocardial infarction prevention and decision was it was not indicated  LABORATORY TESTING:  Health maintenance labs ordered today as discussed above.   The natural history of prostate cancer and ongoing  controversy regarding screening and potential treatment outcomes of prostate cancer has been discussed with the patient. The meaning of a false positive PSA and a false negative PSA has been discussed. He indicates understanding of the limitations of this screening test and wishes to proceed with screening PSA testing.   IMMUNIZATIONS:   - Tdap: Tetanus vaccination status reviewed: last tetanus booster within 10 years. - Influenza: Administered today - Pneumovax: Up to date - Prevnar: Up to date - HPV: Not applicable - Zostavax vaccine:  Discussed at visit today  SCREENING: - Colonoscopy: Refused  Discussed with patient purpose of the colonoscopy is to detect colon cancer at curable precancerous or early stages   - AAA Screening: Not applicable  -Hearing Test: Not applicable  -Spirometry: Not applicable   PATIENT COUNSELING:    Sexuality: Discussed sexually transmitted diseases, partner selection, use of condoms, avoidance of unintended pregnancy  and contraceptive alternatives.   Advised to avoid cigarette smoking.  I discussed with the patient that most people either abstain from alcohol or drink within safe limits (<=14/week and <=4 drinks/occasion for males, <=7/weeks and <= 3 drinks/occasion for females) and that the risk for alcohol disorders and other health effects rises proportionally with the number of drinks per week and how often a drinker exceeds daily limits.  Discussed cessation/primary prevention of drug use and availability of treatment for abuse.   Diet: Encouraged to adjust caloric intake to maintain  or achieve ideal body weight, to reduce intake of dietary saturated fat and total fat, to limit sodium intake by avoiding high sodium foods and not adding table salt, and to maintain adequate dietary potassium and calcium preferably from fresh fruits, vegetables, and low-fat dairy products.    stressed the importance of regular exercise  Injury prevention: Discussed  safety belts, safety helmets, smoke detector, smoking near bedding or upholstery.   Dental health: Discussed importance of regular tooth brushing, flossing, and dental visits.   Follow up plan: NEXT PREVENTATIVE PHYSICAL DUE IN 1 YEAR. No follow-ups on file.

## 2022-08-30 ENCOUNTER — Other Ambulatory Visit: Payer: Self-pay | Admitting: Nurse Practitioner

## 2022-08-30 DIAGNOSIS — I10 Essential (primary) hypertension: Secondary | ICD-10-CM

## 2022-08-31 ENCOUNTER — Ambulatory Visit (INDEPENDENT_AMBULATORY_CARE_PROVIDER_SITE_OTHER): Payer: Medicaid Other | Admitting: Dermatology

## 2022-08-31 VITALS — Wt 166.0 lb

## 2022-08-31 DIAGNOSIS — Z79899 Other long term (current) drug therapy: Secondary | ICD-10-CM | POA: Diagnosis not present

## 2022-08-31 DIAGNOSIS — K130 Diseases of lips: Secondary | ICD-10-CM

## 2022-08-31 DIAGNOSIS — L732 Hidradenitis suppurativa: Secondary | ICD-10-CM

## 2022-08-31 DIAGNOSIS — L853 Xerosis cutis: Secondary | ICD-10-CM | POA: Diagnosis not present

## 2022-08-31 MED ORDER — ISOTRETINOIN 10 MG PO CAPS: 10.0000 mg | ORAL_CAPSULE | Freq: Every day | ORAL | 0 refills | Status: DC

## 2022-08-31 NOTE — Telephone Encounter (Signed)
Requested by interface surescripts. Last doc. Refill 08/04/22 #90 1 refill. Requesting too soon. Future visit in 1 week  Requested Prescriptions  Refused Prescriptions Disp Refills   folic acid (FOLVITE) 1 MG tablet [Pharmacy Med Name: FOLIC ACID 1 MG TABLET] 90 tablet 1    Sig: TAKE 1 TABLET BY MOUTH EVERY DAY     Endocrinology:  Vitamins Passed - 08/30/2022 12:30 PM      Passed - Valid encounter within last 12 months    Recent Outpatient Visits           7 months ago Essential hypertension   [place], NP   8 months ago [place], NP   9 months ago [place] Vigg, [place], MD   1 year ago Seasonal allergic rhinitis due to pollen   [facility] Jon Billings, NP   1 year ago Essential hypertension   [place] Jon Billings, NP       Future Appointments             In 1 week Jon Billings, NP [place], [place]   In 1 month Ralene Bathe, MD [place]

## 2022-08-31 NOTE — Progress Notes (Unsigned)
Isotretinoin Follow-Up Visit   Subjective  Caleb Brooks is a 62 y.o. male who presents for the following: Isotretinoin follow-up  Week # 40   Isotretinoin F/U - 08/31/22 1200       Isotretinoin Follow Up   iPledge # [id number]    Date 08/31/22    Weight 166 lb (75.3 kg)    Acne breakouts since last visit? Yes      Dosage   Target Dosage (mg) 14520    Current (To Date) Dosage (mg) 4500    To Go Dosage (mg) 10020      Side Effects   Skin WNL    Gastrointestinal WNL    Neurological WNL    Constitutional WNL            Side effects: Dry skin, dry lips  The following portions of the chart were reviewed this encounter and updated as appropriate: medications, allergies, medical history  Review of Systems:  No other skin or systemic complaints except as noted in HPI or Assessment and Plan.  Objective  Well appearing patient in no apparent distress; mood and affect are within normal limits.  An examination of the face, neck, chest, and back was performed and relevant findings are noted below.    Assessment & Plan   HIDRADENITIS SUPPURATIVA with acne - on low-dose that he is tolerating well.  Cannot tolerate higher dose due to mood changes  Chronic and persistent condition with duration or expected duration over one year. Condition is symptomatic/ bothersome to patient. Not currently at goal.  Hidradenitis Suppurativa is a chronic; persistent; non-curable, but treatable condition due to abnormal inflamed sweat glands in the body folds (axilla, inframammary, groin, medial thighs), causing recurrent painful draining cysts and scarring. It can be associated with severe scarring acne and cysts; also abscesses and scarring of scalp. The goal is control and prevention of flares, as it is not curable. Scars are permanent and can be thickened. Treatment may include daily use of topical medication and oral antibiotics.  Oral isotretinoin may also be helpful.  For some cases,  Humira or Cosentyx (biologic injections) may be prescribed to decrease the inflammatory process and prevent flares.  When indicated, inflamed cysts may also be treated surgically.  Conditions are severe; chronic and persistent; not at goal. Patient is on Isotretinoin -  requiring FDA mandated monthly evaluations and laboratory monitoring.   While taking isotretinoin, do not share pills and do not donate blood. Generic isotretinoin is best absorbed when taken with a fatty meal. Isotretinoin can make you sensitive to the sun. Daily careful sun protection including sunscreen SPF 30+ when outdoors is recommended.    Continue Isotretinoin 10 mg po QD. Advised patient to stop medication if any suicidal thoughts or ideations.    Patient confirmed in iPledge and isotretinoin sent to pharmacy. If not able to [facility] in timely manner consider using [facility] instead.   Ipledge [id number] [facility] [address]    Dose to date: 4500 mg  Total mg/kg:  59.8 mg/kg    Continue Mometasone but switch to ointment due to stinging with cream. Apply to aa QD up to 5d/wk to decrease inflammation of active areas  Xerosis secondary to isotretinoin therapy - Continue emollients as directed - Xyzal (levocetirizine) once a day and fish oil 1 gram daily may also help with dryness  Cheilitis secondary to isotretinoin therapy - Continue lip balm as directed, Dr. Luvenia Heller Cortibalm recommended  Long term medication management (  isotretinoin) - While taking Isotretinoin and for 30 days after you finish the medication, do not share pills, do not donate blood. It is very important that a women who could become pregnant not take this medicine or get a blood transfusion with this medicine in it. Isotretinoin is best absorbed when taken with a fatty meal. Isotretinoin can make you sensitive to the sun. Daily careful sun protection including sunscreen SPF 30+ when outdoors is recommended.  Follow-up in 30 days.  [name], CMA, am acting as scribe for [name], MD .  Documentation: I have reviewed the above documentation for accuracy and completeness, and I agree with the above.  [name], MD

## 2022-08-31 NOTE — Patient Instructions (Signed)
Due to recent changes in healthcare laws, you may see results of your pathology and/or laboratory studies on MyChart before the doctors have had a chance to review them. We understand that in some cases there may be results that are confusing or concerning to you. Please understand that not all results are received at the same time and often the doctors may need to interpret multiple results in order to provide you with the best plan of care or course of treatment. Therefore, we ask that you please give us 2 business days to thoroughly review all your results before contacting the office for clarification. Should we see a critical lab result, you will be contacted sooner.   If You Need Anything After Your Visit  If you have any questions or concerns for your doctor, please call our main line at [phone] and press option 4 to reach your doctor's medical assistant. If no one answers, please leave a voicemail as directed and we will return your call as soon as possible. Messages left after 4 pm will be answered the following business day.   You may also send us a message via MyChart. We typically respond to MyChart messages within 1-2 business days.  For prescription refills, please ask your pharmacy to contact our office. Our fax number is [phone].  If you have an urgent issue when the clinic is closed that cannot wait until the next business day, you can page your doctor at the number below.    Please note that while we do our best to be available for urgent issues outside of office hours, we are not available 24/7.   If you have an urgent issue and are unable to reach us, you may choose to seek medical care at your doctor's office, retail clinic, urgent care center, or emergency room.  If you have a medical emergency, please immediately call 911 or go to the emergency department.  Pager Numbers  - Dr. Kowalski: [phone]  - Dr. Moye: [phone]  - Dr. Stewart:  [phone]  In the event of inclement weather, please call our main line at [phone] for an update on the status of any delays or closures.  Dermatology Medication Tips: Please keep the boxes that topical medications come in in order to help keep track of the instructions about where and how to use these. Pharmacies typically print the medication instructions only on the boxes and not directly on the medication tubes.   If your medication is too expensive, please contact our office at [phone] option 4 or send us a message through MyChart.   We are unable to tell what your co-pay for medications will be in advance as this is different depending on your insurance coverage. However, we may be able to find a substitute medication at lower cost or fill out paperwork to get insurance to cover a needed medication.   If a prior authorization is required to get your medication covered by your insurance company, please allow us 1-2 business days to complete this process.  Drug prices often vary depending on where the prescription is filled and some pharmacies may offer cheaper prices.  The website www.goodrx.com contains coupons for medications through different pharmacies. The prices here do not account for what the cost may be with help from insurance (it may be cheaper with your insurance), but the website can give you the price if you did not use any insurance.  - You can print the associated coupon and take it with   your prescription to the pharmacy.  - You may also stop by our office during regular business hours and pick up a GoodRx coupon card.  - If you need your prescription sent electronically to a different pharmacy, notify our office through [facility] MyChart or by phone at [phone] option 4.     Si Usted Necesita Algo Despus de Su Visita  Tambin puede enviarnos un mensaje a travs de MyChart. Por lo general respondemos a los mensajes de MyChart en el transcurso de 1 a 2  das hbiles.  Para renovar recetas, por favor pida a su farmacia que se ponga en contacto con nuestra oficina. Nuestro nmero de fax es el [phone].  Si tiene un asunto urgente cuando la clnica est cerrada y que no puede esperar hasta el siguiente da hbil, puede llamar/localizar a su doctor(a) al nmero que aparece a continuacin.   Por favor, tenga en cuenta que aunque hacemos todo lo posible para estar disponibles para asuntos urgentes fuera del horario de oficina, no estamos disponibles las 24 horas del da, los 7 das de la semana.   Si tiene un problema urgente y no puede comunicarse con nosotros, puede optar por buscar atencin mdica  en el consultorio de su doctor(a), en una clnica privada, en un centro de atencin urgente o en una sala de emergencias.  Si tiene una emergencia mdica, por favor llame inmediatamente al 911 o vaya a la sala de emergencias.  Nmeros de bper  - Dr. Kowalski: [phone]  - Dra. Moye: [phone]  - Dra. Stewart: [phone]  En caso de inclemencias del tiempo, por favor llame a nuestra lnea principal al [phone] para una actualizacin sobre el estado de cualquier retraso o cierre.  Consejos para la medicacin en dermatologa: Por favor, guarde las cajas en las que vienen los medicamentos de uso tpico para ayudarle a seguir las instrucciones sobre dnde y cmo usarlos. Las farmacias generalmente imprimen las instrucciones del medicamento slo en las cajas y no directamente en los tubos del medicamento.   Si su medicamento es muy caro, por favor, pngase en contacto con nuestra oficina llamando al [phone] y presione la opcin 4 o envenos un mensaje a travs de MyChart.   No podemos decirle cul ser su copago por los medicamentos por adelantado ya que esto es diferente dependiendo de la cobertura de su seguro. Sin embargo, es posible que podamos encontrar un medicamento sustituto a menor costo o llenar un formulario para que el  seguro cubra el medicamento que se considera necesario.   Si se requiere una autorizacin previa para que su compaa de seguros cubra su medicamento, por favor permtanos de 1 a 2 das hbiles para completar este proceso.  Los precios de los medicamentos varan con frecuencia dependiendo del lugar de dnde se surte la receta y alguna farmacias pueden ofrecer precios ms baratos.  El sitio web www.goodrx.com tiene cupones para medicamentos de diferentes farmacias. Los precios aqu no tienen en cuenta lo que podra costar con la ayuda del seguro (puede ser ms barato con su seguro), pero el sitio web puede darle el precio si no utiliz ningn seguro.  - Puede imprimir el cupn correspondiente y llevarlo con su receta a la farmacia.  - Tambin puede pasar por nuestra oficina durante el horario de atencin regular y recoger una tarjeta de cupones de GoodRx.  - Si necesita que su receta se enve electrnicamente a una farmacia diferente, informe a nuestra oficina a travs de MyChart de [facility]   o por telfono llamando al [phone] y presione la opcin 4.  

## 2022-09-01 ENCOUNTER — Encounter: Payer: Self-pay | Admitting: Dermatology

## 2022-09-02 ENCOUNTER — Ambulatory Visit: Payer: Medicaid Other | Admitting: Nurse Practitioner

## 2022-09-08 ENCOUNTER — Encounter: Payer: Self-pay | Admitting: Nurse Practitioner

## 2022-09-08 ENCOUNTER — Ambulatory Visit: Payer: Medicaid Other | Admitting: Nurse Practitioner

## 2022-09-08 VITALS — BP 114/76 | HR 76 | Temp 98.0°F | Ht 71.0 in | Wt 171.3 lb

## 2022-09-08 DIAGNOSIS — F1010 Alcohol abuse, uncomplicated: Secondary | ICD-10-CM

## 2022-09-08 DIAGNOSIS — Z0000 Encounter for general adult medical examination without abnormal findings: Secondary | ICD-10-CM | POA: Diagnosis not present

## 2022-09-08 DIAGNOSIS — Z1211 Encounter for screening for malignant neoplasm of colon: Secondary | ICD-10-CM

## 2022-09-08 DIAGNOSIS — I10 Essential (primary) hypertension: Secondary | ICD-10-CM | POA: Diagnosis not present

## 2022-09-08 DIAGNOSIS — L732 Hidradenitis suppurativa: Secondary | ICD-10-CM | POA: Diagnosis not present

## 2022-09-08 DIAGNOSIS — Z136 Encounter for screening for cardiovascular disorders: Secondary | ICD-10-CM

## 2022-09-08 LAB — MICROSCOPIC EXAMINATION
Bacteria, UA: NONE SEEN
Epithelial Cells (non renal): NONE SEEN /hpf (ref 0–10)
WBC, UA: NONE SEEN /hpf (ref 0–5)

## 2022-09-08 LAB — URINALYSIS, ROUTINE W REFLEX MICROSCOPIC
Glucose, UA: NEGATIVE
Ketones, UA: NEGATIVE
Leukocytes,UA: NEGATIVE
Nitrite, UA: NEGATIVE
RBC, UA: NEGATIVE
Specific Gravity, UA: >1.03 — ABNORMAL HIGH (ref 1.005–1.030)
Urobilinogen, Ur: 1 mg/dL (ref 0.2–1.0)
pH, UA: 5 (ref 5.0–7.5)

## 2022-09-08 MED ORDER — CETIRIZINE HCL 10 MG PO TABS: 10.0000 mg | ORAL_TABLET | Freq: Every day | ORAL | 1 refills | Status: DC

## 2022-09-08 MED ORDER — AMLODIPINE BESYLATE 5 MG PO TABS: 5.0000 mg | ORAL_TABLET | Freq: Every day | ORAL | 1 refills | Status: DC

## 2022-09-08 MED ORDER — FLUTICASONE PROPIONATE 50 MCG/ACT NA SUSP: 2.0000 | Freq: Every day | NASAL | 6 refills | Status: DC

## 2022-09-08 MED ORDER — THIAMINE HCL 100 MG PO TABS: 100.0000 mg | ORAL_TABLET | Freq: Every day | ORAL | 1 refills | Status: DC

## 2022-09-08 MED ORDER — FOLIC ACID 1 MG PO TABS
1.0000 mg | ORAL_TABLET | Freq: Every day | ORAL | 1 refills | Status: DC
Start: 2022-09-08 — End: 2023-04-13

## 2022-09-08 NOTE — Assessment & Plan Note (Signed)
Current everyday drinker.  Drinks about 40 ounces of beer daily.

## 2022-09-08 NOTE — Assessment & Plan Note (Signed)
Chronic.  Controlled.  Continue with current medication regimen of Amlodipine daily.  Labs ordered today.  Return to clinic in 6 months for reevaluation.  Call sooner if concerns arise.

## 2022-09-08 NOTE — Assessment & Plan Note (Signed)
Chronic.  Controlled.  Continue with current medication regimen.  Followed by Dr. Nehemiah Massed.  Labs ordered today.  Return to clinic in 6 months for reevaluation.  Call sooner if concerns arise.

## 2022-09-08 NOTE — Progress Notes (Signed)
BP 114/76   Pulse 76   Temp 98 F (36.7 C) (Oral)   Ht 5\' 11"  (1.803 m)   Wt 171 lb 4.8 oz (77.7 kg)   SpO2 96%   BMI 23.89 kg/m    Subjective:    Patient ID: Caleb Brooks, male    DOB: 12-Dec-1960, 62 y.o.   MRN: [id number]  HPI: Caleb Brooks is a 62 y.o. male presenting on 09/08/2022 for comprehensive medical examination. Current medical complaints include:none  He currently lives with: Interim Problems from his last visit: no  HYPERTENSION Hypertension status: controlled  Satisfied with current treatment? no Duration of hypertension: years BP monitoring frequency:  not checking BP range:  BP medication side effects:  no Medication compliance: excellent compliance Previous BP meds:amlodipine Aspirin: no Recurrent headaches: no Visual changes: no Palpitations: no Dyspnea: no Chest pain: no Lower extremity edema: no Dizzy/lightheaded: no  Still seeing Dr. Nehemiah Massed for hidradenitis.  Takes Gabapentin as needed for pain.  He is currently drinking 40 ounces of beer daily.   Depression Screen done today and results listed below:     09/08/2022    3:54 PM 01/16/2022    2:48 PM 12/22/2021    3:04 PM 08/04/2021    2:43 PM 07/07/2021    1:10 PM  Depression screen PHQ 2/9  Decreased Interest 3 1 2 3 3   Down, Depressed, Hopeless 3 1 2 3 3   PHQ - 2 Score 6 2 4 6 6   Altered sleeping 3 1 3 3 3   Tired, decreased energy 3 1 2 3 3   Change in appetite 3 1 3 3 3   Feeling bad or failure about yourself  0 1 3 3 3   Trouble concentrating 0 0 1 3 1   Moving slowly or fidgety/restless 0 1 1 3 3   Suicidal thoughts 0 0 0 0 0  PHQ-9 Score 15 7 17 24 22   Difficult doing work/chores Somewhat difficult Somewhat difficult Very difficult Extremely dIfficult     The patient does not have a history of falls. I did complete a risk assessment for falls. A plan of care for falls was documented.   Past Medical History:  Past Medical History:  Diagnosis Date   Anxiety    Arthritis    Gout     Hypertension     Surgical History:  Past Surgical History:  Procedure Laterality Date   CYST REMOVAL TRUNK     Groin area around 2000   EMBOLIZATION N/A 04/12/2020   Procedure: EMBOLIZATION, spleen;  Surgeon: Algernon Huxley, MD;  Location: [facility];  Service: Cardiovascular;  Laterality: N/A;   INCISION AND DRAINAGE ABSCESS N/A 07/26/2019   Procedure: INCISION AND DRAINAGE ABSCESS;  Surgeon: Hollice Espy, MD;  Location: [facility];  Service: Urology;  Laterality: N/A;   INCISION AND DRAINAGE ABSCESS N/A 07/26/2019   Procedure: INCISION AND DRAINAGE ABSCESS;  Surgeon: Benjamine Sprague, DO;  Location: [facility];  Service: General;  Laterality: N/A;   LAPAROTOMY N/A 04/13/2020   Procedure: EXPLORATORY LAPAROTOMY;  Surgeon: Jules Husbands, MD;  Location: [facility];  Service: General;  Laterality: N/A;   SPLENECTOMY, TOTAL      Medications:  Current Outpatient Medications on File Prior to Visit  Medication Sig   celecoxib (CELEBREX) 100 MG capsule TAKE 1 CAPSULE BY MOUTH EVERY DAY   isotretinoin (ACCUTANE) 10 MG capsule Take 1 capsule (10 mg total) by mouth daily.   gabapentin (NEURONTIN) 400 MG capsule TAKE 1 CAPSULE  BY MOUTH 3 TIMES DAILY. (Patient not taking: Reported on 09/08/2022)   No current facility-administered medications on file prior to visit.    Allergies:  Allergies  Allergen Reactions   Ibuprofen Other (See Comments)    Stomach upset    Social History:  Social History   Socioeconomic History   Marital status: Single    Spouse name: Not on file   Number of children: Not on file   Years of education: Not on file   Highest education level: Not on file  Occupational History   Occupation: unemployed  Tobacco Use   Smoking status: Every Day    Packs/day: 0.50    Years: 30.00    Additional pack years: 0.00    Total pack years: 15.00    Types: Cigarettes   Smokeless tobacco: Never   Tobacco comments:    1/2- 1 pack per day  Vaping Use   Vaping Use: Never  used  Substance and Sexual Activity   Alcohol use: Yes    Comment: 32 oz a few days per week   Drug use: Yes    Types: Cocaine, Marijuana    Comment: patient doesn't state how many times he uses in a week or when he last used   Sexual activity: Not Currently  Other Topics Concern   Not on file  Social History Narrative   Not on file   Social Determinants of Health   Financial Resource Strain: Unknown (03/14/2020)   Overall Financial Resource Strain (CARDIA)    Difficulty of Paying Living Expenses: Patient declined  Food Insecurity: No Food Insecurity (10/30/2020)   Hunger Vital Sign    Worried About Running Out of Food in the Last Year: Never true    Ran Out of Food in the Last Year: Never true  Transportation Needs: No Transportation Needs (10/30/2020)   PRAPARE - Hydrologist (Medical): No    Lack of Transportation (Non-Medical): No  Physical Activity: Not on file  Stress: Stress Concern Present (03/14/2020)   [place]    Feeling of Stress : Very much  Social Connections: Not on file  Intimate Partner Violence: Not on file   Social History   Tobacco Use  Smoking Status Every Day   Packs/day: 0.50   Years: 30.00   Additional pack years: 0.00   Total pack years: 15.00   Types: Cigarettes  Smokeless Tobacco Never  Tobacco Comments   1/2- 1 pack per day   Social History   Substance and Sexual Activity  Alcohol Use Yes   Comment: 32 oz a few days per week    Family History:  Family History  Problem Relation Age of Onset   Arthritis Mother    Diabetes Father    Hypertension Father    Congestive Heart Failure Father    Hypertension Sister    CVA Sister    Bipolar disorder Brother    Schizophrenia Brother    Cancer Brother     Past medical history, surgical history, medications, allergies, family history and social history reviewed with patient today and changes  made to appropriate areas of the chart.   Review of Systems  Eyes:  Negative for blurred vision and double vision.  Respiratory:  Negative for shortness of breath.   Cardiovascular:  Negative for chest pain, palpitations and leg swelling.  Musculoskeletal:        Boils in groin and lump under skin  Neurological:  Negative for dizziness and headaches.   All other ROS negative except what is listed above and in the HPI.      Objective:    BP 114/76   Pulse 76   Temp 98 F (36.7 C) (Oral)   Ht 5\' 11"  (1.803 m)   Wt 171 lb 4.8 oz (77.7 kg)   SpO2 96%   BMI 23.89 kg/m   Wt Readings from Last 3 Encounters:  09/08/22 171 lb 4.8 oz (77.7 kg)  08/31/22 166 lb (75.3 kg)  07/27/22 166 lb (75.3 kg)    Physical Exam Vitals and nursing note reviewed.  Constitutional:      General: He is not in acute distress.    Appearance: Normal appearance. He is not ill-appearing, toxic-appearing or diaphoretic.  HENT:     Head: Normocephalic.     Right Ear: Tympanic membrane, ear canal and external ear normal.     Left Ear: Tympanic membrane, ear canal and external ear normal.     Nose: Nose normal. No congestion or rhinorrhea.     Mouth/Throat:     Mouth: Mucous membranes are moist.  Eyes:     General:        Right eye: No discharge.        Left eye: No discharge.     Extraocular Movements: Extraocular movements intact.     Conjunctiva/sclera: Conjunctivae normal.     Pupils: Pupils are equal, round, and reactive to light.  Cardiovascular:     Rate and Rhythm: Normal rate and regular rhythm.     Heart sounds: No murmur heard. Pulmonary:     Effort: Pulmonary effort is normal. No respiratory distress.     Breath sounds: Normal breath sounds. No wheezing, rhonchi or rales.  Abdominal:     General: Abdomen is flat. Bowel sounds are normal. There is no distension.     Palpations: Abdomen is soft.     Tenderness: There is no abdominal tenderness. There is no guarding.  Musculoskeletal:      Cervical back: Normal range of motion and neck supple.  Skin:    General: Skin is warm and dry.     Capillary Refill: Capillary refill takes less than 2 seconds.  Neurological:     General: No focal deficit present.     Mental Status: He is alert and oriented to person, place, and time.     Cranial Nerves: No cranial nerve deficit.     Motor: No weakness.     Deep Tendon Reflexes: Reflexes normal.  Psychiatric:        Mood and Affect: Mood normal.        Behavior: Behavior normal.        Thought Content: Thought content normal.        Judgment: Judgment normal.     Results for orders placed or performed in visit on 01/16/22  Comp Met (CMET)  Result Value Ref Range   Glucose 83 70 - 99 mg/dL   BUN 7 6 - 24 mg/dL   Creatinine, Ser 0.98 0.76 - 1.27 mg/dL   eGFR 89 >59 mL/min/1.73   BUN/Creatinine Ratio 7 (L) 9 - 20   Sodium 139 134 - 144 mmol/L   Potassium 4.1 3.5 - 5.2 mmol/L   Chloride 101 96 - 106 mmol/L   CO2 22 20 - 29 mmol/L   Calcium 9.2 8.7 - 10.2 mg/dL   Total Protein 8.3 6.0 - 8.5 g/dL   Albumin 4.0 3.8 -  4.9 g/dL   Globulin, Total 4.3 1.5 - 4.5 g/dL   Albumin/Globulin Ratio 0.9 (L) 1.2 - 2.2   Bilirubin Total 0.3 0.0 - 1.2 mg/dL   Alkaline Phosphatase 179 (H) 44 - 121 IU/L   AST 15 0 - 40 IU/L   ALT 8 0 - 44 IU/L      Assessment & Plan:   Problem List Items Addressed This Visit       Cardiovascular and Mediastinum   Essential hypertension    Chronic.  Controlled.  Continue with current medication regimen of Amlodipine daily.  Labs ordered today.  Return to clinic in 6 months for reevaluation.  Call sooner if concerns arise.        Relevant Medications   amLODipine (NORVASC) 5 MG tablet   folic acid (FOLVITE) 1 MG tablet   thiamine (VITAMIN B1) 100 MG tablet     Musculoskeletal and Integument   Hidradenitis suppurativa    Chronic.  Controlled.  Continue with current medication regimen.  Followed by Dr. Nehemiah Massed.  Labs ordered today.  Return to clinic  in 6 months for reevaluation.  Call sooner if concerns arise.          Other   ETOH abuse    Current everyday drinker.  Drinks about 40 ounces of beer daily.      Other Visit Diagnoses     Annual physical exam    -  Primary   Health maintenance reviewed during visit today.  Labs ordered.  Declined shingles. Cologuard ordered.   Relevant Orders   TSH   PSA   Lipid panel   CBC with Differential/Platelet   Comprehensive metabolic panel   Urinalysis, Routine w reflex microscopic   Screening for ischemic heart disease       Relevant Orders   Lipid panel   Screening for colon cancer       Relevant Orders   Cologuard        Discussed aspirin prophylaxis for myocardial infarction prevention and decision was it was not indicated  LABORATORY TESTING:  Health maintenance labs ordered today as discussed above.   The natural history of prostate cancer and ongoing controversy regarding screening and potential treatment outcomes of prostate cancer has been discussed with the patient. The meaning of a false positive PSA and a false negative PSA has been discussed. He indicates understanding of the limitations of this screening test and wishes to proceed with screening PSA testing.   IMMUNIZATIONS:   - Tdap: Tetanus vaccination status reviewed: last tetanus booster within 10 years. - Influenza: Administered today - Pneumovax: Up to date - Prevnar: Up to date - HPV: Not applicable - Zostavax vaccine:  Discussed at visit today  SCREENING: - Colonoscopy: Cologuard ordered today Discussed with patient purpose of the colonoscopy is to detect colon cancer at curable precancerous or early stages   - AAA Screening: Not applicable  -Hearing Test: Not applicable  -Spirometry: Not applicable   PATIENT COUNSELING:    Sexuality: Discussed sexually transmitted diseases, partner selection, use of condoms, avoidance of unintended pregnancy  and contraceptive alternatives.   Advised to  avoid cigarette smoking.  I discussed with the patient that most people either abstain from alcohol or drink within safe limits (<=14/week and <=4 drinks/occasion for males, <=7/weeks and <= 3 drinks/occasion for females) and that the risk for alcohol disorders and other health effects rises proportionally with the number of drinks per week and how often a drinker exceeds daily limits.  Discussed  cessation/primary prevention of drug use and availability of treatment for abuse.   Diet: Encouraged to adjust caloric intake to maintain  or achieve ideal body weight, to reduce intake of dietary saturated fat and total fat, to limit sodium intake by avoiding high sodium foods and not adding table salt, and to maintain adequate dietary potassium and calcium preferably from fresh fruits, vegetables, and low-fat dairy products.    stressed the importance of regular exercise  Injury prevention: Discussed safety belts, safety helmets, smoke detector, smoking near bedding or upholstery.   Dental health: Discussed importance of regular tooth brushing, flossing, and dental visits.   Follow up plan: NEXT PREVENTATIVE PHYSICAL DUE IN 1 YEAR. Return in about 6 months (around 03/10/2023) for HTN, HLD, DM2 FU.

## 2022-09-09 LAB — COMPREHENSIVE METABOLIC PANEL
ALT: 9 IU/L (ref 0–44)
AST: 20 IU/L (ref 0–40)
Albumin/Globulin Ratio: 1.1 — ABNORMAL LOW (ref 1.2–2.2)
Albumin: 4.1 g/dL (ref 3.8–4.9)
Alkaline Phosphatase: 163 IU/L — ABNORMAL HIGH (ref 44–121)
BUN/Creatinine Ratio: 9 — ABNORMAL LOW (ref 10–24)
BUN: 7 mg/dL — ABNORMAL LOW (ref 8–27)
Bilirubin Total: 0.4 mg/dL (ref 0.0–1.2)
CO2: 23 mmol/L (ref 20–29)
Calcium: 9.2 mg/dL (ref 8.6–10.2)
Chloride: 101 mmol/L (ref 96–106)
Creatinine, Ser: 0.78 mg/dL (ref 0.76–1.27)
Globulin, Total: 3.7 g/dL (ref 1.5–4.5)
Glucose: 83 mg/dL (ref 70–99)
Potassium: 4 mmol/L (ref 3.5–5.2)
Sodium: 138 mmol/L (ref 134–144)
Total Protein: 7.8 g/dL (ref 6.0–8.5)
eGFR: 102 mL/min/{1.73_m2} (ref >59)

## 2022-09-09 LAB — CBC WITH DIFFERENTIAL/PLATELET
Basophils Absolute: 0 10*3/uL (ref 0.0–0.2)
Basos: 1 %
EOS (ABSOLUTE): 0.1 10*3/uL (ref 0.0–0.4)
Eos: 2 %
Hematocrit: 47.5 % (ref 37.5–51.0)
Hemoglobin: 16.3 g/dL (ref 13.0–17.7)
Immature Grans (Abs): 0 10*3/uL (ref 0.0–0.1)
Immature Granulocytes: 0 %
Lymphocytes Absolute: 2.2 10*3/uL (ref 0.7–3.1)
Lymphs: 38 %
MCH: 32 pg (ref 26.6–33.0)
MCHC: 34.3 g/dL (ref 31.5–35.7)
MCV: 93 fL (ref 79–97)
Monocytes Absolute: 0.7 10*3/uL (ref 0.1–0.9)
Monocytes: 12 %
Neutrophils Absolute: 2.7 10*3/uL (ref 1.4–7.0)
Neutrophils: 47 %
Platelets: 263 10*3/uL (ref 150–450)
RBC: 5.1 x10E6/uL (ref 4.14–5.80)
RDW: 13.2 % (ref 11.6–15.4)
WBC: 5.7 10*3/uL (ref 3.4–10.8)

## 2022-09-09 LAB — LIPID PANEL
Chol/HDL Ratio: 2.1 ratio (ref 0.0–5.0)
Cholesterol, Total: 151 mg/dL (ref 100–199)
HDL: 73 mg/dL (ref >39)
LDL Chol Calc (NIH): 65 mg/dL (ref 0–99)
Triglycerides: 64 mg/dL (ref 0–149)
VLDL Cholesterol Cal: 13 mg/dL (ref 5–40)

## 2022-09-09 LAB — PSA: Prostate Specific Ag, Serum: 0.7 ng/mL (ref 0.0–4.0)

## 2022-09-09 LAB — TSH: TSH: 2.21 u[IU]/mL (ref 0.450–4.500)

## 2022-09-09 NOTE — Progress Notes (Signed)
Hi Caleb Brooks. It was nice to see you yesterday.  Your lab work looks good.  Your liver enzymes remain elevated but consistent with prior.  I recommend decreasing your alcohol intake.  No other concerns at this time. Continue with your current medication regimen.  Follow up as discussed.  Please let me know if you have any questions.

## 2022-09-15 LAB — COLOGUARD

## 2022-09-16 NOTE — Progress Notes (Signed)
Please let patient know that the sample for his cologuard could not be processed due to their not being a sample.

## 2022-09-30 ENCOUNTER — Telehealth: Payer: Self-pay

## 2022-09-30 NOTE — Telephone Encounter (Signed)
Called the patient as requested to discuss letter regarding no shows. Explained to patient that although he called the answering service the call must be at least 24 hours in advance. Patient apologized and explained that he rides a bike and if it starts raining that he is unable to come to his appointment. Patient requested that a note be added to his account so that someone from the office can cancel his appointment if it starts raining. Patient says that he loves coming to our office and really likes his provider and does not ever want to be dismissed. I reiterated the requirements and informed the patient that I would include a note as per his request. Patient acknowledged understanding.

## 2022-09-30 NOTE — Telephone Encounter (Signed)
Copied from CRM [id number]. Topic: Complaint - Staff >> Sep 30, 2022  3:46 PM Clide Dales wrote: Patient states that he received a letter about his no shows on 07/20/22 and 08/17/22. Patient states that both times he called and spoke with the after hours nurse and they told him they would pass along the message that he was cancelling his appointment. Patient would like to speak with practice admin because he does not want to be dismissed from practice. Please advise.

## 2022-10-01 ENCOUNTER — Ambulatory Visit (INDEPENDENT_AMBULATORY_CARE_PROVIDER_SITE_OTHER): Payer: Medicaid Other | Admitting: Dermatology

## 2022-10-01 VITALS — Wt 166.0 lb

## 2022-10-01 DIAGNOSIS — L701 Acne conglobata: Secondary | ICD-10-CM | POA: Diagnosis not present

## 2022-10-01 DIAGNOSIS — Z79899 Other long term (current) drug therapy: Secondary | ICD-10-CM

## 2022-10-01 DIAGNOSIS — L732 Hidradenitis suppurativa: Secondary | ICD-10-CM | POA: Diagnosis not present

## 2022-10-01 DIAGNOSIS — L853 Xerosis cutis: Secondary | ICD-10-CM | POA: Diagnosis not present

## 2022-10-01 DIAGNOSIS — K130 Diseases of lips: Secondary | ICD-10-CM

## 2022-10-01 NOTE — Progress Notes (Signed)
Isotretinoin Follow-Up Visit   Subjective  Caleb Brooks is a 62 y.o. male who presents for the following: Isotretinoin follow-up - pt currently taking 10 mg po QD and tolerating medication well.  Week # 44 [facility] # Total mg -  4,800 mg Total mg/kg - 61.7 mg/kg   Isotretinoin F/U - 10/01/22 1500       Isotretinoin Follow Up   iPledge # [id number]    Date 10/01/22    Weight 166 lb (75.3 kg)    Acne breakouts since last visit? Yes      Dosage   Target Dosage (mg) 14520    Current (To Date) Dosage (mg) 4800    To Go Dosage (mg) 9720      Side Effects   Skin WNL    Gastrointestinal WNL    Neurological WNL    Constitutional WNL    Other Side Effects loose stool            Side effects: Dry skin, dry lips  The following portions of the chart were reviewed this encounter and updated as appropriate: medications, allergies, medical history  Review of Systems:  No other skin or systemic complaints except as noted in HPI or Assessment and Plan.  Objective  Well appearing patient in no apparent distress; mood and affect are within normal limits.  An examination of the face, neck, chest, and back was performed and relevant findings are noted below.    Assessment & Plan   Acne conglobata  Hidradenitis suppurativa  Related Medications isotretinoin (ACCUTANE) 10 MG capsule Take 1 capsule (10 mg total) by mouth daily.  Medication management  Long-term use of high-risk medication   HIDRADENITIS SUPPURATIVA with acne - on low-dose that he is tolerating well.  Cannot tolerate higher dose due to mood changes   Chronic and persistent condition with duration or expected duration over one year. Condition is symptomatic/ bothersome to patient. Not currently at goal.   Hidradenitis Suppurativa is a chronic; persistent; non-curable, but treatable condition due to abnormal inflamed sweat glands in the body folds (axilla, inframammary, groin,  medial thighs), causing recurrent painful draining cysts and scarring. It can be associated with severe scarring acne and cysts; also abscesses and scarring of scalp. The goal is control and prevention of flares, as it is not curable. Scars are permanent and can be thickened. Treatment may include daily use of topical medication and oral antibiotics.  Oral isotretinoin may also be helpful.  For some cases, Humira or Cosentyx (biologic injections) may be prescribed to decrease the inflammatory process and prevent flares.  When indicated, inflamed cysts may also be treated surgically.   Conditions are severe; chronic and persistent; not at goal. Patient is on Isotretinoin -  requiring FDA mandated monthly evaluations and laboratory monitoring.   While taking isotretinoin, do not share pills and do not donate blood. Generic isotretinoin is best absorbed when taken with a fatty meal. Isotretinoin can make you sensitive to the sun. Daily careful sun protection including sunscreen SPF 30+ when outdoors is recommended.    Continue Isotretinoin 10 mg po QD. Advised patient to stop medication if any suicidal thoughts or ideations.    Patient confirmed in iPledge and isotretinoin sent to pharmacy. If not able to [facility] in timely manner consider using [facility] instead.   Continue Mometasone but switch to ointment due to stinging with cream. Apply to aa QD up to 5d/wk to decrease inflammation of  active areas   Xerosis secondary to isotretinoin therapy - Continue emollients as directed - Xyzal (levocetirizine) once a day and fish oil 1 gram daily may also help with dryness   Cheilitis secondary to isotretinoin therapy - Continue lip balm as directed, Dr. Clayborne Artist Cortibalm recommended   Long term medication management (isotretinoin) - While taking Isotretinoin and for 30 days after you finish the medication, do not share pills, do not donate blood. It is very important that a women who  could become pregnant not take this medicine or get a blood transfusion with this medicine in it. Isotretinoin is best absorbed when taken with a fatty meal. Isotretinoin can make you sensitive to the sun. Daily careful sun protection including sunscreen SPF 30+ when outdoors is recommended   Xerosis secondary to isotretinoin therapy - Continue emollients as directed - Xyzal (levocetirizine) once a day and fish oil 1 gram daily may also help with dryness   Cheilitis secondary to isotretinoin therapy - Continue lip balm as directed, Dr. Clayborne Artist Cortibalm recommended   Long term medication management (isotretinoin) - While taking Isotretinoin and for 30 days after you finish the medication, do not share pills, do not donate blood. It is very important that a women who could become pregnant not take this medicine or get a blood transfusion with this medicine in it. Isotretinoin is best absorbed when taken with a fatty meal. Isotretinoin can make you sensitive to the sun. Daily careful sun protection including sunscreen SPF 30+ when outdoors is recommended.  Follow-up in 30 days.  Maylene Roes, CMA, am acting as scribe for Armida Sans, MD .  Documentation: I have reviewed the above documentation for accuracy and completeness, and I agree with the above.  Armida Sans, MD

## 2022-10-01 NOTE — Patient Instructions (Signed)
Due to recent changes in healthcare laws, you may see results of your pathology and/or laboratory studies on MyChart before the doctors have had a chance to review them. We understand that in some cases there may be results that are confusing or concerning to you. Please understand that not all results are received at the same time and often the doctors may need to interpret multiple results in order to provide you with the best plan of care or course of treatment. Therefore, we ask that you please give us 2 business days to thoroughly review all your results before contacting the office for clarification. Should we see a critical lab result, you will be contacted sooner.   If You Need Anything After Your Visit  If you have any questions or concerns for your doctor, please call our main line at [phone] and press option 4 to reach your doctor's medical assistant. If no one answers, please leave a voicemail as directed and we will return your call as soon as possible. Messages left after 4 pm will be answered the following business day.   You may also send us a message via MyChart. We typically respond to MyChart messages within 1-2 business days.  For prescription refills, please ask your pharmacy to contact our office. Our fax number is [phone].  If you have an urgent issue when the clinic is closed that cannot wait until the next business day, you can page your doctor at the number below.    Please note that while we do our best to be available for urgent issues outside of office hours, we are not available 24/7.   If you have an urgent issue and are unable to reach us, you may choose to seek medical care at your doctor's office, retail clinic, urgent care center, or emergency room.  If you have a medical emergency, please immediately call 911 or go to the emergency department.  Pager Numbers  - Dr. Kowalski: [phone]  - Dr. Moye: [phone]  - Dr. Stewart:  [phone]  In the event of inclement weather, please call our main line at [phone] for an update on the status of any delays or closures.  Dermatology Medication Tips: Please keep the boxes that topical medications come in in order to help keep track of the instructions about where and how to use these. Pharmacies typically print the medication instructions only on the boxes and not directly on the medication tubes.   If your medication is too expensive, please contact our office at [phone] option 4 or send us a message through MyChart.   We are unable to tell what your co-pay for medications will be in advance as this is different depending on your insurance coverage. However, we may be able to find a substitute medication at lower cost or fill out paperwork to get insurance to cover a needed medication.   If a prior authorization is required to get your medication covered by your insurance company, please allow us 1-2 business days to complete this process.  Drug prices often vary depending on where the prescription is filled and some pharmacies may offer cheaper prices.  The website www.goodrx.com contains coupons for medications through different pharmacies. The prices here do not account for what the cost may be with help from insurance (it may be cheaper with your insurance), but the website can give you the price if you did not use any insurance.  - You can print the associated coupon and take it with   your prescription to the pharmacy.  - You may also stop by our office during regular business hours and pick up a GoodRx coupon card.  - If you need your prescription sent electronically to a different pharmacy, notify our office through [place] MyChart or by phone at [phone] option 4.     Si Usted Necesita Algo Despus de Su Visita  Tambin puede enviarnos un mensaje a travs de MyChart. Por lo general respondemos a los mensajes de MyChart en el transcurso de 1 a 2  das hbiles.  Para renovar recetas, por favor pida a su farmacia que se ponga en contacto con nuestra oficina. Nuestro nmero de fax es el [phone].  Si tiene un asunto urgente cuando la clnica est cerrada y que no puede esperar hasta el siguiente da hbil, puede llamar/localizar a su doctor(a) al nmero que aparece a continuacin.   Por favor, tenga en cuenta que aunque hacemos todo lo posible para estar disponibles para asuntos urgentes fuera del horario de oficina, no estamos disponibles las 24 horas del da, los 7 das de la semana.   Si tiene un problema urgente y no puede comunicarse con nosotros, puede optar por buscar atencin mdica  en el consultorio de su doctor(a), en una clnica privada, en un centro de atencin urgente o en una sala de emergencias.  Si tiene una emergencia mdica, por favor llame inmediatamente al 911 o vaya a la sala de emergencias.  Nmeros de bper  - Dr. Kowalski: [phone]  - Dra. Moye: [phone]  - Dra. Stewart: [phone]  En caso de inclemencias del tiempo, por favor llame a nuestra lnea principal al [phone] para una actualizacin sobre el estado de cualquier retraso o cierre.  Consejos para la medicacin en dermatologa: Por favor, guarde las cajas en las que vienen los medicamentos de uso tpico para ayudarle a seguir las instrucciones sobre dnde y cmo usarlos. Las farmacias generalmente imprimen las instrucciones del medicamento slo en las cajas y no directamente en los tubos del medicamento.   Si su medicamento es muy caro, por favor, pngase en contacto con nuestra oficina llamando al [phone] y presione la opcin 4 o envenos un mensaje a travs de MyChart.   No podemos decirle cul ser su copago por los medicamentos por adelantado ya que esto es diferente dependiendo de la cobertura de su seguro. Sin embargo, es posible que podamos encontrar un medicamento sustituto a menor costo o llenar un formulario para que el  seguro cubra el medicamento que se considera necesario.   Si se requiere una autorizacin previa para que su compaa de seguros cubra su medicamento, por favor permtanos de 1 a 2 das hbiles para completar este proceso.  Los precios de los medicamentos varan con frecuencia dependiendo del lugar de dnde se surte la receta y alguna farmacias pueden ofrecer precios ms baratos.  El sitio web www.goodrx.com tiene cupones para medicamentos de diferentes farmacias. Los precios aqu no tienen en cuenta lo que podra costar con la ayuda del seguro (puede ser ms barato con su seguro), pero el sitio web puede darle el precio si no utiliz ningn seguro.  - Puede imprimir el cupn correspondiente y llevarlo con su receta a la farmacia.  - Tambin puede pasar por nuestra oficina durante el horario de atencin regular y recoger una tarjeta de cupones de GoodRx.  - Si necesita que su receta se enve electrnicamente a una farmacia diferente, informe a nuestra oficina a travs de MyChart de [place]   o por telfono llamando al [phone] y presione la opcin 4.  

## 2022-10-04 ENCOUNTER — Other Ambulatory Visit: Payer: Self-pay | Admitting: Nurse Practitioner

## 2022-10-06 NOTE — Telephone Encounter (Signed)
Requested Prescriptions  Pending Prescriptions Disp Refills   celecoxib (CELEBREX) 100 MG capsule [Pharmacy Med Name: CELECOXIB 100 MG CAPSULE] 90 capsule 0    Sig: TAKE 1 CAPSULE BY MOUTH EVERY DAY     Analgesics:  COX2 Inhibitors Failed - 10/04/2022  8:42 AM      Failed - Manual Review: Labs are only required if the patient has taken medication for more than 8 weeks.      Passed - HGB in normal range and within 360 days    Hemoglobin  Date Value Ref Range Status  09/08/2022 16.3 13.0 - 17.7 g/dL Final         Passed - Cr in normal range and within 360 days    Creatinine, Ser  Date Value Ref Range Status  09/08/2022 0.78 0.76 - 1.27 mg/dL Final         Passed - HCT in normal range and within 360 days    Hematocrit  Date Value Ref Range Status  09/08/2022 47.5 37.5 - 51.0 % Final         Passed - AST in normal range and within 360 days    AST  Date Value Ref Range Status  09/08/2022 20 0 - 40 IU/L Final         Passed - ALT in normal range and within 360 days    ALT  Date Value Ref Range Status  09/08/2022 9 0 - 44 IU/L Final         Passed - eGFR is 30 or above and within 360 days    GFR calc Af Amer  Date Value Ref Range Status  03/14/2020 81 >59 mL/min/1.73 Final    Comment:    **[facility] currently reports eGFR in compliance with the current**   recommendations of the [employer]. [facility] will   update reporting as new guidelines are published from the NKF-ASN   Task force.    GFR, Estimated  Date Value Ref Range Status  11/22/2021 >60 >60 mL/min Final    Comment:    (NOTE) Calculated using the CKD-EPI Creatinine Equation (2021)    eGFR  Date Value Ref Range Status  09/08/2022 102 >59 mL/min/1.73 Final         Passed - Patient is not pregnant      Passed - Valid encounter within last 12 months    Recent Outpatient Visits           4 weeks ago Annual physical exam   [place] [facility] Larae Grooms, NP   8  months ago Essential hypertension   [place] [facility] Larae Grooms, NP   9 months ago Epididymitis   [place] [facility] Larae Grooms, NP   10 months ago Epididymitis   [place] [facility] Vigg, Avanti, MD   1 year ago Seasonal allergic rhinitis due to pollen   [facility] Larae Grooms, NP       Future Appointments             In 1 month Deirdre Evener, MD [facility] [place] [facility]   In 5 months Larae Grooms, NP [place] [facility]

## 2022-10-09 ENCOUNTER — Encounter: Payer: Self-pay | Admitting: Dermatology

## 2022-11-05 ENCOUNTER — Ambulatory Visit (INDEPENDENT_AMBULATORY_CARE_PROVIDER_SITE_OTHER): Payer: Medicaid Other | Admitting: Dermatology

## 2022-11-05 VITALS — BP 120/83 | HR 83 | Wt 166.0 lb

## 2022-11-05 DIAGNOSIS — L853 Xerosis cutis: Secondary | ICD-10-CM

## 2022-11-05 DIAGNOSIS — L732 Hidradenitis suppurativa: Secondary | ICD-10-CM

## 2022-11-05 DIAGNOSIS — Z79899 Other long term (current) drug therapy: Secondary | ICD-10-CM

## 2022-11-05 DIAGNOSIS — L701 Acne conglobata: Secondary | ICD-10-CM

## 2022-11-05 DIAGNOSIS — K130 Diseases of lips: Secondary | ICD-10-CM | POA: Diagnosis not present

## 2022-11-05 MED ORDER — ISOTRETINOIN 10 MG PO CAPS
10.0000 mg | ORAL_CAPSULE | Freq: Every day | ORAL | 0 refills | Status: DC
Start: 2022-11-05 — End: 2022-12-03

## 2022-11-05 NOTE — Patient Instructions (Signed)
Due to recent changes in healthcare laws, you may see results of your pathology and/or laboratory studies on MyChart before the doctors have had a chance to review them. We understand that in some cases there may be results that are confusing or concerning to you. Please understand that not all results are received at the same time and often the doctors may need to interpret multiple results in order to provide you with the best plan of care or course of treatment. Therefore, we ask that you please give us 2 business days to thoroughly review all your results before contacting the office for clarification. Should we see a critical lab result, you will be contacted sooner.   If You Need Anything After Your Visit  If you have any questions or concerns for your doctor, please call our main line at [phone] and press option 4 to reach your doctor's medical assistant. If no one answers, please leave a voicemail as directed and we will return your call as soon as possible. Messages left after 4 pm will be answered the following business day.   You may also send us a message via MyChart. We typically respond to MyChart messages within 1-2 business days.  For prescription refills, please ask your pharmacy to contact our office. Our fax number is [phone].  If you have an urgent issue when the clinic is closed that cannot wait until the next business day, you can page your doctor at the number below.    Please note that while we do our best to be available for urgent issues outside of office hours, we are not available 24/7.   If you have an urgent issue and are unable to reach us, you may choose to seek medical care at your doctor's office, retail clinic, urgent care center, or emergency room.  If you have a medical emergency, please immediately call 911 or go to the emergency department.  Pager Numbers  - Dr. Kowalski: [phone]  - Dr. Moye: [phone]  - Dr. Stewart:  [phone]  In the event of inclement weather, please call our main line at [phone] for an update on the status of any delays or closures.  Dermatology Medication Tips: Please keep the boxes that topical medications come in in order to help keep track of the instructions about where and how to use these. Pharmacies typically print the medication instructions only on the boxes and not directly on the medication tubes.   If your medication is too expensive, please contact our office at [phone] option 4 or send us a message through MyChart.   We are unable to tell what your co-pay for medications will be in advance as this is different depending on your insurance coverage. However, we may be able to find a substitute medication at lower cost or fill out paperwork to get insurance to cover a needed medication.   If a prior authorization is required to get your medication covered by your insurance company, please allow us 1-2 business days to complete this process.  Drug prices often vary depending on where the prescription is filled and some pharmacies may offer cheaper prices.  The website www.goodrx.com contains coupons for medications through different pharmacies. The prices here do not account for what the cost may be with help from insurance (it may be cheaper with your insurance), but the website can give you the price if you did not use any insurance.  - You can print the associated coupon and take it with   your prescription to the pharmacy.  - You may also stop by our office during regular business hours and pick up a GoodRx coupon card.  - If you need your prescription sent electronically to a different pharmacy, notify our office through [place] MyChart or by phone at [phone] option 4.     Si Usted Necesita Algo Despus de Su Visita  Tambin puede enviarnos un mensaje a travs de MyChart. Por lo general respondemos a los mensajes de MyChart en el transcurso de 1 a 2  das hbiles.  Para renovar recetas, por favor pida a su farmacia que se ponga en contacto con nuestra oficina. Nuestro nmero de fax es el [phone].  Si tiene un asunto urgente cuando la clnica est cerrada y que no puede esperar hasta el siguiente da hbil, puede llamar/localizar a su doctor(a) al nmero que aparece a continuacin.   Por favor, tenga en cuenta que aunque hacemos todo lo posible para estar disponibles para asuntos urgentes fuera del horario de oficina, no estamos disponibles las 24 horas del da, los 7 das de la semana.   Si tiene un problema urgente y no puede comunicarse con nosotros, puede optar por buscar atencin mdica  en el consultorio de su doctor(a), en una clnica privada, en un centro de atencin urgente o en una sala de emergencias.  Si tiene una emergencia mdica, por favor llame inmediatamente al 911 o vaya a la sala de emergencias.  Nmeros de bper  - Dr. Kowalski: [phone]  - Dra. Moye: [phone]  - Dra. Stewart: [phone]  En caso de inclemencias del tiempo, por favor llame a nuestra lnea principal al [phone] para una actualizacin sobre el estado de cualquier retraso o cierre.  Consejos para la medicacin en dermatologa: Por favor, guarde las cajas en las que vienen los medicamentos de uso tpico para ayudarle a seguir las instrucciones sobre dnde y cmo usarlos. Las farmacias generalmente imprimen las instrucciones del medicamento slo en las cajas y no directamente en los tubos del medicamento.   Si su medicamento es muy caro, por favor, pngase en contacto con nuestra oficina llamando al [phone] y presione la opcin 4 o envenos un mensaje a travs de MyChart.   No podemos decirle cul ser su copago por los medicamentos por adelantado ya que esto es diferente dependiendo de la cobertura de su seguro. Sin embargo, es posible que podamos encontrar un medicamento sustituto a menor costo o llenar un formulario para que el  seguro cubra el medicamento que se considera necesario.   Si se requiere una autorizacin previa para que su compaa de seguros cubra su medicamento, por favor permtanos de 1 a 2 das hbiles para completar este proceso.  Los precios de los medicamentos varan con frecuencia dependiendo del lugar de dnde se surte la receta y alguna farmacias pueden ofrecer precios ms baratos.  El sitio web www.goodrx.com tiene cupones para medicamentos de diferentes farmacias. Los precios aqu no tienen en cuenta lo que podra costar con la ayuda del seguro (puede ser ms barato con su seguro), pero el sitio web puede darle el precio si no utiliz ningn seguro.  - Puede imprimir el cupn correspondiente y llevarlo con su receta a la farmacia.  - Tambin puede pasar por nuestra oficina durante el horario de atencin regular y recoger una tarjeta de cupones de GoodRx.  - Si necesita que su receta se enve electrnicamente a una farmacia diferente, informe a nuestra oficina a travs de MyChart de [place]   o por telfono llamando al [phone] y presione la opcin 4.  

## 2022-11-05 NOTE — Progress Notes (Signed)
   Isotretinoin Follow-Up Visit   Subjective  Caleb Brooks is a 62 y.o. male who presents for the following: Isotretinoin follow-up  Week # 48 Pharmacy [facility]  iPLEDGE #[id number] Total mg -  5,100 mg  Total mg/kg - 67.7 mg/kg   Isotretinoin F/U - 11/05/22 1500       Isotretinoin Follow Up   iPledge # [id number]    Date 11/05/22    Weight 166 lb (75.3 kg)    Acne breakouts since last visit? Yes      Dosage   Target Dosage (mg) 14520    Current (To Date) Dosage (mg) 5100    To Go Dosage (mg) 9420      Side Effects   Skin WNL    Gastrointestinal WNL    Neurological WNL    Constitutional WNL              Side effects: Dry skin, dry lips  The following portions of the chart were reviewed this encounter and updated as appropriate: medications, allergies, medical history  Review of Systems:  No other skin or systemic complaints except as noted in HPI or Assessment and Plan.  Objective  Well appearing patient in no apparent distress; mood and affect are within normal limits.  An examination of the face, neck, chest, and back was performed and relevant findings are noted below.     Assessment & Plan   ACNE conglobata and hidradenitis suppurativa   Patient is currently on Isotretinoin requiring FDA mandated monthly evaluations and laboratory monitoring. Condition is currently not to goal (must reach target dose based on weight and also have clear skin for 2 months prior to discontinuation in order to help prevent relapse)  Continue Isotretinoin 10 mg po QD. Advised patient to stop medication if any suicidal thoughts or ideations. Will continue lower dose due to history of depression while on higher doses in the past.   Patient confirmed in iPledge and isotretinoin sent to pharmacy.   Xerosis secondary to isotretinoin therapy - Continue emollients as directed - Xyzal (levocetirizine) once a day and fish oil 1 gram daily may also help with  dryness   Cheilitis secondary to isotretinoin therapy - Continue lip balm as directed, Dr. Clayborne Artist Cortibalm recommended   Long term medication management (isotretinoin) - While taking Isotretinoin and for 30 days after you finish the medication, do not share pills, do not donate blood. It is very important that a women who could become pregnant not take this medicine or get a blood transfusion with this medicine in it. Isotretinoin is best absorbed when taken with a fatty meal. Isotretinoin can make you sensitive to the sun. Daily careful sun protection including sunscreen SPF 30+ when outdoors is recommended.  Follow-up in 30 days.  Maylene Roes, CMA, am acting as scribe for Armida Sans, MD .  Documentation: I have reviewed the above documentation for accuracy and completeness, and I agree with the above.  Armida Sans, MD

## 2022-11-17 ENCOUNTER — Encounter: Payer: Self-pay | Admitting: Dermatology

## 2022-11-26 ENCOUNTER — Other Ambulatory Visit: Payer: Self-pay

## 2022-11-26 ENCOUNTER — Other Ambulatory Visit: Payer: Self-pay | Admitting: Nurse Practitioner

## 2022-11-26 NOTE — Telephone Encounter (Signed)
This medication was refused, but it's actually able to refill due to expired Rx at pharmacy, will create a new refill encounter.

## 2022-11-26 NOTE — Telephone Encounter (Signed)
Requested Prescriptions  Pending Prescriptions Disp Refills   gabapentin (NEURONTIN) 400 MG capsule 270 capsule 1    Sig: Take 1 capsule (400 mg total) by mouth 3 (three) times daily.     Neurology: Anticonvulsants - gabapentin Passed - 11/26/2022  4:45 PM      Passed - Cr in normal range and within 360 days    Creatinine, Ser  Date Value Ref Range Status  09/08/2022 0.78 0.76 - 1.27 mg/dL Final         Passed - Completed PHQ-2 or PHQ-9 in the last 360 days      Passed - Valid encounter within last 12 months    Recent Outpatient Visits           2 months ago Annual physical exam   [place] [facility] Larae Grooms, NP   10 months ago Essential hypertension   [place] [facility] Larae Grooms, NP   11 months ago Epididymitis   [place] [facility] Larae Grooms, NP   1 year ago Epididymitis   [place] [facility] Vigg, Avanti, MD   1 year ago Seasonal allergic rhinitis due to pollen   [facility] Larae Grooms, NP       Future Appointments             In 1 week Deirdre Evener, MD [facility]   In 3 months Larae Grooms, NP  [facility]

## 2022-11-26 NOTE — Telephone Encounter (Signed)
Requested Prescriptions  Refused Prescriptions Disp Refills   gabapentin (NEURONTIN) 400 MG capsule [Pharmacy Med Name: GABAPENTIN 400 MG CAPSULE] 270 capsule 1    Sig: TAKE 1 CAPSULE BY MOUTH THREE TIMES A DAY     Neurology: Anticonvulsants - gabapentin Passed - 11/26/2022  2:43 PM      Passed - Cr in normal range and within 360 days    Creatinine, Ser  Date Value Ref Range Status  09/08/2022 0.78 0.76 - 1.27 mg/dL Final         Passed - Completed PHQ-2 or PHQ-9 in the last 360 days      Passed - Valid encounter within last 12 months    Recent Outpatient Visits           2 months ago Annual physical exam   [place] [facility] Larae Grooms, NP   10 months ago Essential hypertension   [place] [facility] Larae Grooms, NP   11 months ago Epididymitis   [place] [facility] Larae Grooms, NP   1 year ago Epididymitis   [place] [facility] Vigg, Avanti, MD   1 year ago Seasonal allergic rhinitis due to pollen   [facility] Larae Grooms, NP       Future Appointments             In 1 week Deirdre Evener, MD [facility] [place] [facility]   In 3 months Larae Grooms, NP [place] [facility]

## 2022-11-30 ENCOUNTER — Other Ambulatory Visit: Payer: Self-pay | Admitting: Nurse Practitioner

## 2022-12-01 NOTE — Telephone Encounter (Signed)
Requested medication (s) are due for refill today: expired medication  Requested medication (s) are on the active medication list: yes   Last refill:  08/28/21 #270 1 refills  Future visit scheduled: yes in 3 months  Notes to clinic:  expired medication. Do you want to renew Rx?     Requested Prescriptions  Pending Prescriptions Disp Refills   gabapentin (NEURONTIN) 400 MG capsule [Pharmacy Med Name: GABAPENTIN 400 MG CAPSULE] 270 capsule 1    Sig: TAKE 1 CAPSULE BY MOUTH THREE TIMES A DAY     Neurology: Anticonvulsants - gabapentin Passed - 11/30/2022  4:43 PM      Passed - Cr in normal range and within 360 days    Creatinine, Ser  Date Value Ref Range Status  09/08/2022 0.78 0.76 - 1.27 mg/dL Final         Passed - Completed PHQ-2 or PHQ-9 in the last 360 days      Passed - Valid encounter within last 12 months    Recent Outpatient Visits           2 months ago Annual physical exam   [place] [facility] Larae Grooms, NP   10 months ago Essential hypertension   [place] [facility] Larae Grooms, NP   11 months ago Epididymitis   [place] [facility] Larae Grooms, NP   1 year ago Epididymitis   [place] [facility] Vigg, Avanti, MD   1 year ago Seasonal allergic rhinitis due to pollen   [facility] Larae Grooms, NP       Future Appointments             In 2 days Deirdre Evener, MD [facility]   In 3 months Larae Grooms, NP [place] [facility]

## 2022-12-02 ENCOUNTER — Other Ambulatory Visit: Payer: Self-pay | Admitting: Nurse Practitioner

## 2022-12-02 DIAGNOSIS — I10 Essential (primary) hypertension: Secondary | ICD-10-CM

## 2022-12-02 NOTE — Telephone Encounter (Signed)
Medication Refill - Medication:  gabapentin (NEURONTIN) 400 MG capsule  celecoxib (CELEBREX) 100 MG capsule  thiamine (VITAMIN B1) 100 MG tablet   Completely out of Gabapentin, only a few Celebrex left.  Patient is requesting his medications be sent in with refills.  Has the patient contacted their pharmacy? Yes.    Preferred Pharmacy (with phone number or street name): [id number] [place], [place] - [address]  Phone: [phone] Fax: [phone]  Has the patient been seen for an appointment in the last year OR does the patient have an upcoming appointment? Yes.  Scheduled for PCP visit on 03/15/2023.  Agent: Please be advised that RX refills may take up to 3 business days. We ask that you follow-up with your pharmacy.

## 2022-12-03 ENCOUNTER — Ambulatory Visit (INDEPENDENT_AMBULATORY_CARE_PROVIDER_SITE_OTHER): Payer: Medicaid Other | Admitting: Dermatology

## 2022-12-03 VITALS — BP 133/94 | Wt 166.0 lb

## 2022-12-03 DIAGNOSIS — Z79899 Other long term (current) drug therapy: Secondary | ICD-10-CM

## 2022-12-03 DIAGNOSIS — L732 Hidradenitis suppurativa: Secondary | ICD-10-CM | POA: Diagnosis not present

## 2022-12-03 DIAGNOSIS — K130 Diseases of lips: Secondary | ICD-10-CM | POA: Diagnosis not present

## 2022-12-03 DIAGNOSIS — Z7189 Other specified counseling: Secondary | ICD-10-CM

## 2022-12-03 MED ORDER — CELECOXIB 100 MG PO CAPS: 100.0000 mg | ORAL_CAPSULE | Freq: Every day | ORAL | 0 refills | Status: DC

## 2022-12-03 MED ORDER — ISOTRETINOIN 10 MG PO CAPS
10.0000 mg | ORAL_CAPSULE | Freq: Every day | ORAL | 0 refills | Status: DC
Start: 2022-12-03 — End: 2023-01-06

## 2022-12-03 NOTE — Progress Notes (Signed)
   Isotretinoin Follow-Up Visit   Subjective  Caleb Brooks is a 62 y.o. male who presents for the following: Isotretinoin follow-up  [facility] # [facility] [address] [address][id number] Total mg -  5,400 Total mg/kg - 71.7    Isotretinoin F/U - 12/03/22 1400       Isotretinoin Follow Up   iPledge # [id number]    Date 12/03/22    Weight 166 lb (75.3 kg)    Acne breakouts since last visit? Yes      Dosage   Target Dosage (mg) 14520    Current (To Date) Dosage (mg) 5400    To Go Dosage (mg) 9120      Side Effects   Skin WNL    Gastrointestinal WNL    Neurological WNL    Constitutional WNL              Side effects: Dry skin, dry lips  The following portions of the chart were reviewed this encounter and updated as appropriate: medications, allergies, medical history  Review of Systems:  No other skin or systemic complaints except as noted in HPI or Assessment and Plan.  Objective  Well appearing patient in no apparent distress; mood and affect are within normal limits.  An examination of the face was performed and relevant findings are noted below.     Assessment & Plan   Acne conglobata and hidradenitis suppurativa   Patient is currently on Isotretinoin requiring FDA mandated monthly evaluations and laboratory monitoring. Condition is currently not to goal (must reach target dose based on weight and also have clear skin for 2 months prior to discontinuation in order to help prevent relapse)  Exam findings: Patient states that lesions still draining.   Continue isotretinoin 10 mg po once daily. Will continue on low dose due to hx of mood changes and depression.   Patient confirmed in iPledge and isotretinoin sent to pharmacy.    Xerosis secondary to isotretinoin therapy - Continue emollients as directed - Xyzal (levocetirizine) once a day and fish oil 1 gram daily may also help with dryness   Cheilitis secondary to isotretinoin therapy -  Continue lip balm as directed, Dr. Clayborne Artist Cortibalm recommended   Long term medication management (isotretinoin) - While taking Isotretinoin and for 30 days after you finish the medication, do not share pills, do not donate blood. It is very important that a women who could become pregnant not take this medicine or get a blood transfusion with this medicine in it. Isotretinoin is best absorbed when taken with a fatty meal. Isotretinoin can make you sensitive to the sun. Daily careful sun protection including sunscreen SPF 30+ when outdoors is recommended.  Follow-up in 30 days.  Maylene Roes, CMA, am acting as scribe for Armida Sans, MD .  Documentation: I have reviewed the above documentation for accuracy and completeness, and I agree with the above.  Armida Sans, MD

## 2022-12-03 NOTE — Telephone Encounter (Signed)
Gabapentin and thiamine requests are too soon for refill.  Requested Prescriptions  Pending Prescriptions Disp Refills   thiamine (VITAMIN B1) 100 MG tablet 90 tablet 1    Sig: Take 1 tablet (100 mg total) by mouth daily.     Off-Protocol Failed - 12/02/2022 11:16 AM      Failed - Medication not assigned to a protocol, review manually.      Passed - Valid encounter within last 12 months    Recent Outpatient Visits           2 months ago Annual physical exam   [place] [facility] Larae Grooms, NP   10 months ago Essential hypertension   [place] [facility] Larae Grooms, NP   11 months ago Epididymitis   [place] [facility] Larae Grooms, NP   1 year ago Epididymitis   [place] [facility] Vigg, Avanti, MD   1 year ago Seasonal allergic rhinitis due to pollen   [facility] Larae Grooms, NP       Future Appointments             Today Deirdre Evener, MD [facility]   In 3 months Larae Grooms, NP [place] [facility]             gabapentin (NEURONTIN) 400 MG capsule 270 capsule 1    Sig: Take 1 capsule (400 mg total) by mouth 3 (three) times daily.     Neurology: Anticonvulsants - gabapentin Passed - 12/02/2022 11:16 AM      Passed - Cr in normal range and within 360 days    Creatinine, Ser  Date Value Ref Range Status  09/08/2022 0.78 0.76 - 1.27 mg/dL Final         Passed - Completed PHQ-2 or PHQ-9 in the last 360 days      Passed - Valid encounter within last 12 months    Recent Outpatient Visits           2 months ago Annual physical exam   [place] [facility] Larae Grooms, NP   10 months ago Essential hypertension   [place] [facility] Larae Grooms, NP   11 months ago Epididymitis   [place] [facility] Larae Grooms, NP   1 year ago  Epididymitis   [place] [facility] Vigg, Avanti, MD   1 year ago Seasonal allergic rhinitis due to pollen   [facility] Larae Grooms, NP       Future Appointments             Today Deirdre Evener, MD [place] [facility]   In 3 months Larae Grooms, NP [place] [facility]             celecoxib (CELEBREX) 100 MG capsule 90 capsule 0    Sig: Take 1 capsule (100 mg total) by mouth daily.     Analgesics:  COX2 Inhibitors Failed - 12/02/2022 11:16 AM      Failed - Manual Review: Labs are only required if the patient has taken medication for more than 8 weeks.      Passed - HGB in normal range and within 360 days    Hemoglobin  Date Value Ref Range Status  09/08/2022 16.3 13.0 - 17.7 g/dL Final         Passed -  Cr in normal range and within 360 days    Creatinine, Ser  Date Value Ref Range Status  09/08/2022 0.78 0.76 - 1.27 mg/dL Final         Passed - HCT in normal range and within 360 days    Hematocrit  Date Value Ref Range Status  09/08/2022 47.5 37.5 - 51.0 % Final         Passed - AST in normal range and within 360 days    AST  Date Value Ref Range Status  09/08/2022 20 0 - 40 IU/L Final         Passed - ALT in normal range and within 360 days    ALT  Date Value Ref Range Status  09/08/2022 9 0 - 44 IU/L Final         Passed - eGFR is 30 or above and within 360 days    GFR calc Af Amer  Date Value Ref Range Status  03/14/2020 81 >59 mL/min/1.73 Final    Comment:    **[facility] currently reports eGFR in compliance with the current**   recommendations of the [employer]. [facility] will   update reporting as new guidelines are published from the NKF-ASN   Task force.    GFR, Estimated  Date Value Ref Range Status  11/22/2021 >60 >60 mL/min Final    Comment:    (NOTE) Calculated using the CKD-EPI Creatinine Equation (2021)    eGFR  Date Value Ref  Range Status  09/08/2022 102 >59 mL/min/1.73 Final         Passed - Patient is not pregnant      Passed - Valid encounter within last 12 months    Recent Outpatient Visits           2 months ago Annual physical exam   [place] [facility] Larae Grooms, NP   10 months ago Essential hypertension   [place] [facility] Larae Grooms, NP   11 months ago Epididymitis   [place] [facility] Larae Grooms, NP   1 year ago Epididymitis   [place] [facility] Vigg, Avanti, MD   1 year ago Seasonal allergic rhinitis due to pollen   [facility] Larae Grooms, NP       Future Appointments             Today Deirdre Evener, MD [place] [facility]   In 3 months Larae Grooms, NP  [facility], [facility]

## 2022-12-03 NOTE — Patient Instructions (Signed)
Due to recent changes in healthcare laws, you may see results of your pathology and/or laboratory studies on MyChart before the doctors have had a chance to review them. We understand that in some cases there may be results that are confusing or concerning to you. Please understand that not all results are received at the same time and often the doctors may need to interpret multiple results in order to provide you with the best plan of care or course of treatment. Therefore, we ask that you please give us 2 business days to thoroughly review all your results before contacting the office for clarification. Should we see a critical lab result, you will be contacted sooner.   If You Need Anything After Your Visit  If you have any questions or concerns for your doctor, please call our main line at [phone] and press option 4 to reach your doctor's medical assistant. If no one answers, please leave a voicemail as directed and we will return your call as soon as possible. Messages left after 4 pm will be answered the following business day.   You may also send us a message via MyChart. We typically respond to MyChart messages within 1-2 business days.  For prescription refills, please ask your pharmacy to contact our office. Our fax number is [phone].  If you have an urgent issue when the clinic is closed that cannot wait until the next business day, you can page your doctor at the number below.    Please note that while we do our best to be available for urgent issues outside of office hours, we are not available 24/7.   If you have an urgent issue and are unable to reach us, you may choose to seek medical care at your doctor's office, retail clinic, urgent care center, or emergency room.  If you have a medical emergency, please immediately call 911 or go to the emergency department.  Pager Numbers  - Dr. Kowalski: [phone]  - Dr. Moye: [phone]  - Dr. Stewart:  [phone]  In the event of inclement weather, please call our main line at [phone] for an update on the status of any delays or closures.  Dermatology Medication Tips: Please keep the boxes that topical medications come in in order to help keep track of the instructions about where and how to use these. Pharmacies typically print the medication instructions only on the boxes and not directly on the medication tubes.   If your medication is too expensive, please contact our office at [phone] option 4 or send us a message through MyChart.   We are unable to tell what your co-pay for medications will be in advance as this is different depending on your insurance coverage. However, we may be able to find a substitute medication at lower cost or fill out paperwork to get insurance to cover a needed medication.   If a prior authorization is required to get your medication covered by your insurance company, please allow us 1-2 business days to complete this process.  Drug prices often vary depending on where the prescription is filled and some pharmacies may offer cheaper prices.  The website www.goodrx.com contains coupons for medications through different pharmacies. The prices here do not account for what the cost may be with help from insurance (it may be cheaper with your insurance), but the website can give you the price if you did not use any insurance.  - You can print the associated coupon and take it with   your prescription to the pharmacy.  - You may also stop by our office during regular business hours and pick up a GoodRx coupon card.  - If you need your prescription sent electronically to a different pharmacy, notify our office through [facility] MyChart or by phone at [phone] option 4.     Si Usted Necesita Algo Despus de Su Visita  Tambin puede enviarnos un mensaje a travs de MyChart. Por lo general respondemos a los mensajes de MyChart en el transcurso de 1 a 2  das hbiles.  Para renovar recetas, por favor pida a su farmacia que se ponga en contacto con nuestra oficina. Nuestro nmero de fax es el [phone].  Si tiene un asunto urgente cuando la clnica est cerrada y que no puede esperar hasta el siguiente da hbil, puede llamar/localizar a su doctor(a) al nmero que aparece a continuacin.   Por favor, tenga en cuenta que aunque hacemos todo lo posible para estar disponibles para asuntos urgentes fuera del horario de oficina, no estamos disponibles las 24 horas del da, los 7 das de la semana.   Si tiene un problema urgente y no puede comunicarse con nosotros, puede optar por buscar atencin mdica  en el consultorio de su doctor(a), en una clnica privada, en un centro de atencin urgente o en una sala de emergencias.  Si tiene una emergencia mdica, por favor llame inmediatamente al 911 o vaya a la sala de emergencias.  Nmeros de bper  - Dr. Kowalski: [phone]  - Dra. Moye: [phone]  - Dra. Stewart: [phone]  En caso de inclemencias del tiempo, por favor llame a nuestra lnea principal al [phone] para una actualizacin sobre el estado de cualquier retraso o cierre.  Consejos para la medicacin en dermatologa: Por favor, guarde las cajas en las que vienen los medicamentos de uso tpico para ayudarle a seguir las instrucciones sobre dnde y cmo usarlos. Las farmacias generalmente imprimen las instrucciones del medicamento slo en las cajas y no directamente en los tubos del medicamento.   Si su medicamento es muy caro, por favor, pngase en contacto con nuestra oficina llamando al [phone] y presione la opcin 4 o envenos un mensaje a travs de MyChart.   No podemos decirle cul ser su copago por los medicamentos por adelantado ya que esto es diferente dependiendo de la cobertura de su seguro. Sin embargo, es posible que podamos encontrar un medicamento sustituto a menor costo o llenar un formulario para que el  seguro cubra el medicamento que se considera necesario.   Si se requiere una autorizacin previa para que su compaa de seguros cubra su medicamento, por favor permtanos de 1 a 2 das hbiles para completar este proceso.  Los precios de los medicamentos varan con frecuencia dependiendo del lugar de dnde se surte la receta y alguna farmacias pueden ofrecer precios ms baratos.  El sitio web www.goodrx.com tiene cupones para medicamentos de diferentes farmacias. Los precios aqu no tienen en cuenta lo que podra costar con la ayuda del seguro (puede ser ms barato con su seguro), pero el sitio web puede darle el precio si no utiliz ningn seguro.  - Puede imprimir el cupn correspondiente y llevarlo con su receta a la farmacia.  - Tambin puede pasar por nuestra oficina durante el horario de atencin regular y recoger una tarjeta de cupones de GoodRx.  - Si necesita que su receta se enve electrnicamente a una farmacia diferente, informe a nuestra oficina a travs de MyChart de [place]   o por telfono llamando al [phone] y presione la opcin 4.  

## 2022-12-04 ENCOUNTER — Encounter: Payer: Self-pay | Admitting: Dermatology

## 2022-12-24 ENCOUNTER — Telehealth: Payer: Self-pay | Admitting: Nurse Practitioner

## 2022-12-24 NOTE — Telephone Encounter (Signed)
Pt in about vitamin costing 25.00 when he used to get it at a lesser price. Please call back. He isnt sure of the name of it, may be the Thiamine B1.

## 2022-12-25 ENCOUNTER — Telehealth: Payer: Self-pay | Admitting: Nurse Practitioner

## 2022-12-25 NOTE — Telephone Encounter (Signed)
Return call to patient to inform him that the price of the medication according to the pharmacist at his local pharmacy, says it could be due to the change of the manufacturer or just the cost of the medication being increased, so that could have cause his co-pay to increase. Unable to leave a voice message as there was not voicemail set up.   OK for [facility] to give note if the patient calls back.

## 2022-12-25 NOTE — Telephone Encounter (Signed)
Called patient to notify him I reached out to his local pharmacy and to inquire about his prescription Vitamin B-1 cost. Pharmacy informed me that they can fill the prescription for $5.99. The call was disconnected with patient. Attempted to reach back out to the patient and the call was stating an error message. Will attempt again later on.  OK for [facility] to give to note if patient calls back.

## 2022-12-25 NOTE — Telephone Encounter (Signed)
Pt is calling in requesting to speak with Destiny. Pt says his medication was only $4 and wants to know why the price has changed to $5.99. Please follow up with pt.

## 2023-01-06 ENCOUNTER — Ambulatory Visit (INDEPENDENT_AMBULATORY_CARE_PROVIDER_SITE_OTHER): Payer: Medicaid Other | Admitting: Dermatology

## 2023-01-06 ENCOUNTER — Encounter: Payer: Self-pay | Admitting: Dermatology

## 2023-01-06 VITALS — BP 138/80 | Wt 166.0 lb

## 2023-01-06 DIAGNOSIS — R208 Other disturbances of skin sensation: Secondary | ICD-10-CM

## 2023-01-06 DIAGNOSIS — Z7189 Other specified counseling: Secondary | ICD-10-CM

## 2023-01-06 DIAGNOSIS — K130 Diseases of lips: Secondary | ICD-10-CM | POA: Diagnosis not present

## 2023-01-06 DIAGNOSIS — Z79899 Other long term (current) drug therapy: Secondary | ICD-10-CM | POA: Diagnosis not present

## 2023-01-06 DIAGNOSIS — L853 Xerosis cutis: Secondary | ICD-10-CM | POA: Diagnosis not present

## 2023-01-06 DIAGNOSIS — L732 Hidradenitis suppurativa: Secondary | ICD-10-CM

## 2023-01-06 MED ORDER — GABAPENTIN 400 MG PO CAPS: 400.0000 mg | ORAL_CAPSULE | Freq: Three times a day (TID) | ORAL | 0 refills | Status: DC

## 2023-01-06 MED ORDER — ISOTRETINOIN 10 MG PO CAPS
10.0000 mg | ORAL_CAPSULE | Freq: Every day | ORAL | 0 refills | Status: DC
Start: 2023-01-06 — End: 2023-02-23

## 2023-01-06 MED ORDER — CELECOXIB 100 MG PO CAPS: 100.0000 mg | ORAL_CAPSULE | Freq: Every day | ORAL | 0 refills | Status: DC

## 2023-01-06 NOTE — Progress Notes (Signed)
Isotretinoin Follow-Up Visit   Subjective  Caleb Brooks is a 62 y.o. male who presents for the following: Isotretinoin follow-up - He has had several painful boils in the last month that have drained  [facility] [place] [facility] [phone] Total mg -  5700 Total mg/kg - 75.7     Isotretinoin F/U - 01/06/23 1600       Isotretinoin Follow Up   iPledge # [id number]    Date 01/06/23    Weight 166 lb (75.3 kg)    Acne breakouts since last visit? Yes      Dosage   Target Dosage (mg) 14520    Current (To Date) Dosage (mg) 5700    To Go Dosage (mg) 8820      Side Effects   Skin WNL    Gastrointestinal WNL    Neurological WNL    Constitutional WNL                Side effects: Dry skin, dry lips  The following portions of the chart were reviewed this encounter and updated as appropriate: medications, allergies, medical history  Review of Systems:  No other skin or systemic complaints except as noted in HPI or Assessment and Plan.  Objective  Well appearing patient in no apparent distress; mood and affect are within normal limits.  An examination of the face, neck, chest, and back was performed and relevant findings are noted below.     Assessment & Plan   Hidradenitis suppurativa  Related Medications isotretinoin (ACCUTANE) 10 MG capsule Take 1 capsule (10 mg total) by mouth daily.    HIDRADENITIS SUPPURATIVA Patient is currently on Isotretinoin requiring FDA mandated monthly evaluations and laboratory monitoring. Condition is currently not to goal (must reach target dose based on weight and also have clear skin for 2 months prior to discontinuation in order to help prevent relapse)    Continue isotretinoin 10 mg 1 po qd  Patient confirmed in iPledge and isotretinoin sent to pharmacy.   PAIN ASSOCIATED WITH HIDRADENITIS SUPPURATIVA    Treatment Plan: Continue Gabapentin 400 mg 3 times per day prn, Celebrex 100 mg 1 po every day  prn - 1 refill of each given today. Advised patient he should reach out to his PCP for more refills.   Xerosis secondary to isotretinoin therapy - Continue emollients as directed - Xyzal (levocetirizine) once a day and fish oil 1 gram daily may also help with dryness   Cheilitis secondary to isotretinoin therapy - Continue lip balm as directed, Dr. Clayborne Artist Cortibalm recommended   Long term medication management (isotretinoin).  Patient is using long term (months to years) prescription medication to control their dermatologic condition.  These medications require periodic monitoring to evaluate for efficacy and side effects and may require periodic laboratory monitoring.  - While taking Isotretinoin and for 30 days after you finish the medication, do not share pills, do not donate blood. It is very important that a women who could become pregnant not take this medicine or get a blood transfusion with this medicine in it. Isotretinoin is best absorbed when taken with a fatty meal. Isotretinoin can make you sensitive to the sun. Daily careful sun protection including sunscreen SPF 30+ when outdoors is recommended.  Follow-up in 30 days.  I, Joanie Coddington, CMA, am acting as scribe for Armida Sans, MD .   Documentation: I have reviewed the above documentation for accuracy and completeness, and I agree with the above.  Armida Sans, MD

## 2023-01-12 ENCOUNTER — Ambulatory Visit: Payer: Self-pay | Admitting: *Deleted

## 2023-01-12 ENCOUNTER — Telehealth: Payer: Medicaid Other | Admitting: Physician Assistant

## 2023-01-12 ENCOUNTER — Ambulatory Visit: Payer: Self-pay

## 2023-01-12 DIAGNOSIS — T63461A Toxic effect of venom of wasps, accidental (unintentional), initial encounter: Secondary | ICD-10-CM | POA: Diagnosis not present

## 2023-01-12 MED ORDER — PREDNISONE 20 MG PO TABS
40.0000 mg | ORAL_TABLET | Freq: Every day | ORAL | 0 refills | Status: DC
Start: 2023-01-12 — End: 2023-10-11

## 2023-01-12 NOTE — Telephone Encounter (Signed)
Chief Complaint: Yellow jacket stings Symptoms: Left ear swollen very large "looks like 2 ears on my head" Frequency: stung 2 days ago Pertinent Negatives: Patient denies SOB Disposition: [] ED /[] Urgent Care (no appt availability in office) / [] Appointment(In office/virtual)/ [x]  [facility]/ [] Home Care/ [] Refused Recommended Disposition /[] [facility]/ []  Follow-up with PCP Additional Notes: Patient says he had multiple yellow jacket stings to his right ear, left ear, left top of head. EMS was called and because no swelling, they told him it was nothing they could do. He said the swelling started during the night after he got stung and it was so painful he couldn't sleep on the left side. He says it was discolored more than the other ear, but not red. Now it's sore to the touch. He asked for PCN when he spoke to the agent. Advised he will need OV, attempted virtual visit since late in the hour, no available appointments until Thursday. Advised virtual UC, scheduled at 1700 today. He says he will try his best to get on because he is not able to get into his MyChart and is not good with technology. Advised to call the office back if unable to connect, but also advised the provider will attempt to call him as well. Advised UC if symptoms do not improve. He verbalized understanding.   Reason for Disposition  Swelling is huge (e.g., more than 4 inches or 10 cm, spreads beyond wrist or ankle)  Answer Assessment - Initial Assessment Questions 1. TYPE: "What type of sting was it?" (bee, yellow jacket, etc.)      Yellow Jacket 2. ONSET: "When did it occur?"      2 days ago 3. LOCATION: "Where is the sting located?"  "How many stings?"     Right ear, left side top of head, above right eye; multiple stings 4. SWELLING SIZE: "How big is the swelling?" (e.g., inches or cm)     Very swollen on the ear 5. REDNESS: "Is the area red or pink?" If Yes, ask: "What size is area of redness?"  (e.g., inches or cm). "When did the redness start?"     It turned a lighter color than the other one 6. PAIN: "Is there any pain?" If Yes, ask: "How bad is it?"  (Scale 1-10; or mild, moderate, severe)     Not now, just when touched 7. ITCHING: "Is there any itching?" If Yes, ask: "How bad is it?"      Yes 8. RESPIRATORY DISTRESS: "Describe your breathing."     No SOB 9. PRIOR REACTIONS: "Have you had any severe allergic reactions to stings in the past?" if yes, ask: "What happened?"     No 10. OTHER SYMPTOMS: "Do you have any other symptoms?" (e.g., abdomen pain, face or tongue swelling, new rash elsewhere, vomiting)       None  Protocols used: Bee or Yellow Jacket Sting-A-AH

## 2023-01-12 NOTE — Progress Notes (Signed)
Virtual Visit Consent   ARPIT BASHIR, you are scheduled for a virtual visit with a [facility] provider today. Just as with appointments in the office, your consent must be obtained to participate. Your consent will be active for this visit and any virtual visit you may have with one of our providers in the next 365 days. If you have a MyChart account, a copy of this consent can be sent to you electronically.  As this is a virtual visit, video technology does not allow for your provider to perform a traditional examination. This may limit your provider's ability to fully assess your condition. If your provider identifies any concerns that need to be evaluated in person or the need to arrange testing (such as labs, EKG, etc.), we will make arrangements to do so. Although advances in technology are sophisticated, we cannot ensure that it will always work on either your end or our end. If the connection with a video visit is poor, the visit may have to be switched to a telephone visit. With either a video or telephone visit, we are not always able to ensure that we have a secure connection.  By engaging in this virtual visit, you consent to the provision of healthcare and authorize for your insurance to be billed (if applicable) for the services provided during this visit. Depending on your insurance coverage, you may receive a charge related to this service.  I need to obtain your verbal consent now. Are you willing to proceed with your visit today? Caleb Brooks has provided verbal consent on 01/12/2023 for a virtual visit (video or telephone). Piedad Climes, [place]  Date: 01/12/2023 5:01 PM  Virtual Visit via Video Note   I, Piedad Climes, connected with  Caleb Brooks  ([id number], [birth year]) on 01/12/23 at  5:00 PM EDT by a video-enabled telemedicine application and verified that I am speaking with the correct person using two identifiers.  Location: Patient: Virtual Visit Location Patient:  Home Provider: Virtual Visit Location Provider: Home Office   I discussed the limitations of evaluation and management by telemedicine and the availability of in person appointments. The patient expressed understanding and agreed to proceed.    History of Present Illness: Caleb Brooks is a 62 y.o. who identifies as a male who was assigned male at birth, and is being seen today for yellow jackets of the R ear happened the day before last. Initially with just pain but started with swelling and itching overnight, continuing into today. Denies fever, chills. Denies any drainage from area of stings. No retained stingers. Denies facial swelling or SOB.  HPI: HPI  Problems:  Patient Active Problem List   Diagnosis Date Noted   Epididymitis 11/25/2021   History of gout 07/31/2020   Hemorrhagic shock (HCC) 04/18/2020   Acute blood loss anemia 04/18/2020   Hypokalemia 04/18/2020   Hypomagnesemia 04/18/2020   Hidradenitis suppurativa 04/18/2020   Splenic hemorrhage 04/12/2020   Hemoperitoneum    Fatigue 02/22/2020   Finger joint swelling, left 02/22/2020   Exposure to herpes 09/27/2019   Cyst of perianal area 07/26/2019   Scrotal abscess 07/26/2019   Onychomycosis 03/21/2019   Callus of foot 02/07/2019   Nail thickening 02/07/2019   Tobacco abuse 01/01/2015   ETOH abuse 01/01/2015   Arthritis 10/02/2014   Essential hypertension 08/02/2014    Allergies:  Allergies  Allergen Reactions   Ibuprofen Other (See Comments)    Stomach upset   Medications:  Current Outpatient  Medications:    predniSONE (DELTASONE) 20 MG tablet, Take 2 tablets (40 mg total) by mouth daily with breakfast., Disp: 10 tablet, Rfl: 0   amLODipine (NORVASC) 5 MG tablet, Take 1 tablet (5 mg total) by mouth daily., Disp: 90 tablet, Rfl: 1   celecoxib (CELEBREX) 100 MG capsule, Take 1 capsule (100 mg total) by mouth daily., Disp: 30 capsule, Rfl: 0   cetirizine (ZYRTEC) 10 MG tablet, Take 1 tablet (10 mg total) by  mouth daily., Disp: 90 tablet, Rfl: 1   fluticasone (FLONASE) 50 MCG/ACT nasal spray, Place 2 sprays into both nostrils daily., Disp: 16 g, Rfl: 6   folic acid (FOLVITE) 1 MG tablet, Take 1 tablet (1 mg total) by mouth daily., Disp: 90 tablet, Rfl: 1   gabapentin (NEURONTIN) 400 MG capsule, Take 1 capsule (400 mg total) by mouth 3 (three) times daily., Disp: 90 capsule, Rfl: 0   isotretinoin (ACCUTANE) 10 MG capsule, Take 1 capsule (10 mg total) by mouth daily., Disp: 30 capsule, Rfl: 0   thiamine (VITAMIN B1) 100 MG tablet, Take 1 tablet (100 mg total) by mouth daily., Disp: 90 tablet, Rfl: 1  Observations/Objective: Patient is well-developed, well-nourished in no acute distress.  Resting comfortably at home.  Head is normocephalic, atraumatic.  No labored breathing.  Speech is clear and coherent with logical content.  Patient is alert and oriented at baseline.  R external ear swelling noted without lesion or drainage.   Assessment and Plan: 1. Yellow jacket sting, accidental or unintentional, initial encounter - predniSONE (DELTASONE) 20 MG tablet; Take 2 tablets (40 mg total) by mouth daily with breakfast.  Dispense: 10 tablet; Refill: 0  With local reaction. Supportive measures and OTC medications reviewed. Will give short burst of prednisone. Strict in-person evaluation precautions reviewed.   Follow Up Instructions: I discussed the assessment and treatment plan with the patient. The patient was provided an opportunity to ask questions and all were answered. The patient agreed with the plan and demonstrated an understanding of the instructions.  A copy of instructions were sent to the patient via MyChart unless otherwise noted below.   The patient was advised to call back or seek an in-person evaluation if the symptoms worsen or if the condition fails to improve as anticipated.  Time:  I spent 10 minutes with the patient via telehealth technology discussing the above  problems/concerns.    Piedad Climes, PA-C

## 2023-01-12 NOTE — Telephone Encounter (Signed)
Third attempt to reach pt, extended ring or "Call can not be completed at this time",each attempt. Routing to practice for provider's resolution per protocol.

## 2023-01-12 NOTE — Telephone Encounter (Signed)
Summary: Yellow Jackets   Stung by yellow jackets two days ago, saw an EMT , no swelling then but now it is starting to swell...   ( Does not want to come in )      Attempted to reach pt, "Call cannot be completed at this time."

## 2023-01-12 NOTE — Patient Instructions (Signed)
Caleb Brooks, thank you for joining Piedad Climes, PA-C for today's virtual visit.  While this provider is not your primary care provider (PCP), if your PCP is located in our provider database this encounter information will be shared with them immediately following your visit.   A [facility] MyChart account gives you access to today's visit and all your visits, tests, and labs performed at [facility] " click here if you don't have a [facility] MyChart account or go to [email or web address]  Consent: (Patient) Caleb Brooks provided verbal consent for this virtual visit at the beginning of the encounter.  Current Medications:  Current Outpatient Medications:    predniSONE (DELTASONE) 20 MG tablet, Take 2 tablets (40 mg total) by mouth daily with breakfast., Disp: 10 tablet, Rfl: 0   amLODipine (NORVASC) 5 MG tablet, Take 1 tablet (5 mg total) by mouth daily., Disp: 90 tablet, Rfl: 1   celecoxib (CELEBREX) 100 MG capsule, Take 1 capsule (100 mg total) by mouth daily., Disp: 30 capsule, Rfl: 0   cetirizine (ZYRTEC) 10 MG tablet, Take 1 tablet (10 mg total) by mouth daily., Disp: 90 tablet, Rfl: 1   fluticasone (FLONASE) 50 MCG/ACT nasal spray, Place 2 sprays into both nostrils daily., Disp: 16 g, Rfl: 6   folic acid (FOLVITE) 1 MG tablet, Take 1 tablet (1 mg total) by mouth daily., Disp: 90 tablet, Rfl: 1   gabapentin (NEURONTIN) 400 MG capsule, Take 1 capsule (400 mg total) by mouth 3 (three) times daily., Disp: 90 capsule, Rfl: 0   isotretinoin (ACCUTANE) 10 MG capsule, Take 1 capsule (10 mg total) by mouth daily., Disp: 30 capsule, Rfl: 0   thiamine (VITAMIN B1) 100 MG tablet, Take 1 tablet (100 mg total) by mouth daily., Disp: 90 tablet, Rfl: 1   Medications ordered in this encounter:  Meds ordered this encounter  Medications   predniSONE (DELTASONE) 20 MG tablet    Sig: Take 2 tablets (40 mg total) by mouth daily with breakfast.    Dispense:  10 tablet     Refill:  0    Order Specific Question:   Supervising Provider    Answer:   Merrilee Jansky [id number]     *If you need refills on other medications prior to your next appointment, please contact your pharmacy*  Follow-Up: Call back or seek an in-person evaluation if the symptoms worsen or if the condition fails to improve as anticipated.  [facility] [phone]  Other Instructions Bee, Wasp, or Hornet Sting, Adult Bees, wasps, and hornets are part of a family of insects that sting. Normally, a sting will cause pain, redness, and swelling at the sting site. However, some people have an allergy to these stings, and their reactions can be much more serious. What increases the risk? You may be at a greater risk of getting stung if you: Provoke a stinging insect by swatting or disturbing it. Wear strong-smelling soaps, deodorants, or body sprays. Spend time outdoors near gardens with flowers or fruit trees or in clothes that expose skin. Eat or drink outside. What are the signs or symptoms? The reaction to an insect sting can vary from a mild, normal response to life-threatening anaphylaxis. The sting site is often a red lump in the skin, sometimes with a tiny hole in the center, that may still have the stinger in the center of the wound. Normal reaction A normal reaction is experienced by most people after an insect sting. Symptoms  include: Pain, redness, and swelling at the sting site. These can develop over 24-48 hours. Pain, redness, and swelling that may spread to a larger, connected area beyond the sting site. The spreading can continue over 24-48 hours. Allergic reaction An allergic reaction can vary in severity and includes symptoms in other areas of the body beyond the sting site. People who experience an allergic reaction have a higher risk of having similar or worse symptoms the next time they are stung. Symptoms may include: Hives, itching, and  swelling. Abdominal symptoms including cramping, nausea, vomiting, and diarrhea. Severe symptoms that require immediate medical attention include: Chest pain or tightness. Wheezing or trouble breathing. Swelling of the tongue, throat, or lips. Trouble swallowing or hoarse voice. Anaphylactic reaction An anaphylactic reaction is a severe, life-threatening allergy and requires immediate medical attention. The symptoms often include severe allergic reaction symptoms that develop rapidly and lead to: A sudden and sharp drop in blood pressure. Dizziness. Loss of consciousness. How is this diagnosed? This condition is usually diagnosed based on your symptoms and medical history as well as a physical exam. You may have an allergy test to determine if you are allergic to the insect venom. How is this treated? If you were stung by a bee, the stinger and a small sac of venom may be in the wound. Remove the stinger as soon as possible. Do this by brushing across the wound with gauze, a clean fingernail, or a flat card such as a credit card. This can help reduce the severity of your body's reaction to the sting. Normal reactions can be treated with: Washing the area thoroughly with soap and water. Applying ice to the area to reduce swelling. Oral or topical medicines to help reduce pain and itching, if present. Pay close attention to your symptoms after you have been stung. If possible, have someone stay with you to see if an allergic reaction develops. If allergic symptoms develop, oral antihistamines can be taken and you will need medical help right away. If you had an allergic reaction before, you may need to: Use an auto-injector "pen"(pre-filled automatic epinephrine injection device)at the first sign of an allergic reaction. Get medical help right away because the epinephrine is short-acting. It is intended to give you more time to get to an emergency room. Follow these instructions at  home:  Wash the sting site 2-3 times a day with soap and water as told by your health care provider. Apply or take over-the-counter and prescription medicines only as told by your health care provider. If directed, apply ice to the sting area. Put ice in a plastic bag. Place a towel between your skin and the bag. Leave the ice on for 20 minutes, 2-3 times a day. Do not scratch the sting area. If you had a severe allergic reaction to a sting, you may need to: Wear a medical bracelet or necklace that lists the allergy. Carry an anaphylaxis kit or an epinephrine auto-injector "pen" with you at all times. Tell your family members, friends, and coworkers when and how to use it. Use it at the first sign of an allergic reaction. How is this prevented? Avoid swatting at stinging insects and disturbing insect nests. Do not use fragrant soaps or lotions and avoid sitting near flowering plants, if possible. Wear shoes, pants, and long sleeves when spending time outdoors, especially in grassy areas where stinging insects are common. Keep outdoor areas free from nests or hives. Keep food and drink containers covered when  eating outdoors. Wear gloves if you are gardening or working outdoors. Find a barrier between you and the insect(s), such as a door, if an attack by a stinging insect or a swarm seems likely. Contact a health care provider if: Your symptoms do not get better in 2-3 days. You have redness, swelling, or pain that spreads beyond the area of the sting. You have a fever. Get help right away if: You have symptoms of a severe allergic reaction. These include: Chest tightness or pain. Wheezing, or trouble swallowing or breathing. Light-headedness, dizziness, or fainting. Itchy, raised, red patches on the skin beyond the sting site. Abdominal cramping, nausea, vomiting, or diarrhea. Trouble swallowing or a swollen tongue, throat, or lips. These symptoms may be an emergency. Get help right  away. Call 911. Do not wait to see if the symptoms will go away. Do not drive yourself to the hospital. Summary Stings from bees, wasps, and hornets can cause pain and swelling, but they are usually not serious. However, some people may have an allergic reaction to a sting. This can cause the symptoms to be more severe. Pay close attention to your symptoms after you have been stung. If possible, have someone stay with you to look for signs of worsening symptoms. Call your health care provider if you have any signs of an allergic reaction. This information is not intended to replace advice given to you by your health care provider. Make sure you discuss any questions you have with your health care provider. Document Revised: 07/22/2021 Document Reviewed: 07/22/2021 Elsevier Patient Education  2024 Elsevier Inc.    If you have been instructed to have an in-person evaluation today at a local Urgent Care facility, please use the link below. It will take you to a list of all of our available [facility], including address, phone number and hours of operation. Please do not delay care.  [facility]  If you or a family member do not have a primary care provider, use the link below to schedule a visit and establish care. When you choose a [facility] primary care physician or advanced practice provider, you gain a long-term partner in health. Find a Primary Care Provider  Learn more about [facility]'s in-office and virtual care options:  - Get Care Now

## 2023-01-13 NOTE — Telephone Encounter (Signed)
Called pt stated that he did have a virtual visit with UC yesterday.  No longer needs appointment.

## 2023-01-13 NOTE — Telephone Encounter (Signed)
Called to schedule appt. Left message on machine for patient to call the office and schedule an appointment.

## 2023-01-13 NOTE — Telephone Encounter (Signed)
Patient will need apt for evaluation and management

## 2023-02-08 ENCOUNTER — Other Ambulatory Visit: Payer: Self-pay | Admitting: Nurse Practitioner

## 2023-02-08 DIAGNOSIS — I10 Essential (primary) hypertension: Secondary | ICD-10-CM

## 2023-02-10 NOTE — Telephone Encounter (Signed)
Requested Prescriptions  Pending Prescriptions Disp Refills   amLODipine (NORVASC) 5 MG tablet [Pharmacy Med Name: AMLODIPINE BESYLATE 5 MG TAB] 90 tablet 0    Sig: TAKE 1 TABLET (5 MG TOTAL) BY MOUTH DAILY.     Cardiovascular: Calcium Channel Blockers 2 Passed - 02/08/2023 12:22 PM      Passed - Last BP in normal range    BP Readings from Last 1 Encounters:  01/06/23 138/80         Passed - Last Heart Rate in normal range    Pulse Readings from Last 1 Encounters:  11/05/22 83         Passed - Valid encounter within last 6 months    Recent Outpatient Visits           5 months ago Annual physical exam   [facility] [facility] Larae Grooms, NP   1 year ago Essential hypertension   [facility] Larae Grooms, NP   1 year ago Epididymitis   [facility] Larae Grooms, NP   1 year ago Epididymitis   [facility] Vigg, Avanti, MD   1 year ago Seasonal allergic rhinitis due to pollen   [facility] Larae Grooms, NP       Future Appointments             In 1 week Deirdre Evener, MD [facility]   In 1 month Larae Grooms, NP [facility]

## 2023-02-18 ENCOUNTER — Ambulatory Visit: Payer: Medicaid Other | Admitting: Dermatology

## 2023-02-23 ENCOUNTER — Ambulatory Visit (INDEPENDENT_AMBULATORY_CARE_PROVIDER_SITE_OTHER): Payer: Medicaid Other | Admitting: Dermatology

## 2023-02-23 ENCOUNTER — Encounter: Payer: Self-pay | Admitting: Dermatology

## 2023-02-23 VITALS — Wt 166.0 lb

## 2023-02-23 DIAGNOSIS — K130 Diseases of lips: Secondary | ICD-10-CM | POA: Diagnosis not present

## 2023-02-23 DIAGNOSIS — R208 Other disturbances of skin sensation: Secondary | ICD-10-CM

## 2023-02-23 DIAGNOSIS — Z79899 Other long term (current) drug therapy: Secondary | ICD-10-CM

## 2023-02-23 DIAGNOSIS — L732 Hidradenitis suppurativa: Secondary | ICD-10-CM | POA: Diagnosis not present

## 2023-02-23 DIAGNOSIS — Z7189 Other specified counseling: Secondary | ICD-10-CM

## 2023-02-23 DIAGNOSIS — L853 Xerosis cutis: Secondary | ICD-10-CM

## 2023-02-23 MED ORDER — GABAPENTIN 400 MG PO CAPS: 400.0000 mg | ORAL_CAPSULE | Freq: Three times a day (TID) | ORAL | 0 refills | Status: DC

## 2023-02-23 MED ORDER — ISOTRETINOIN 10 MG PO CAPS
10.0000 mg | ORAL_CAPSULE | Freq: Every day | ORAL | 0 refills | Status: DC
Start: 2023-02-23 — End: 2023-03-31

## 2023-02-23 NOTE — Progress Notes (Signed)
Follow-Up Visit   Subjective  Caleb Brooks is a 62 y.o. male who presents for the following: Isotretinoin follow-up - He has had a few painful boils in the last month. He has noticed that if he is off Isotretinoin, he tends to flare. He ran out of medication last week.   Isotretinoin F/U - 02/23/23 1200       Isotretinoin Follow Up   iPledge # [id number]    Date 02/23/23    Weight 166 lb (75.3 kg)    Acne breakouts since last visit? Yes      Dosage   Target Dosage (mg) 14520    Current (To Date) Dosage (mg) 6000    To Go Dosage (mg) 8520      Skin Side Effects   Dry Lips No    Nose bleeds No    Dry eyes No    Dry Skin No    Sunburn No      Gastrointestinal Side Effects   Nausea No    Diarrhea No    Blood in stool No      Neurological Side Effects   Blurred vision No    Depression No    Headache No    Homicidal thoughts No    Mood Changes No    Suicidal thoughts No      Constitutional Side Effects   Fatigue No      Musculoskeletal Side Effects   Muscle aches No               The following portions of the chart were reviewed this encounter and updated as appropriate: medications, allergies, medical history  Review of Systems:  No other skin or systemic complaints except as noted in HPI or Assessment and Plan.  Objective  Well appearing patient in no apparent distress; mood and affect are within normal limits.  An examination of the face, neck, chest, and back was performed and relevant findings are noted below.     Relevant exam findings are noted in the Assessment and Plan.    Assessment & Plan    HIDRADENITIS SUPPURATIVA Patient is currently on Isotretinoin requiring FDA mandated monthly evaluations and laboratory monitoring. Condition is currently not to goal (must reach target dose based on weight and also have clear skin for 2 months prior to discontinuation in order to help prevent relapse)   Continue isotretinoin 10 mg 1 po  qd Patient confirmed in iPledge and isotretinoin sent to pharmacy.   Ipledge #[id number] [facility]   PAIN ASSOCIATED WITH HIDRADENITIS SUPPURATIVA   Treatment Plan: Continue Gabapentin 400 mg 3 times per day prn, Celebrex 100 mg 1 po every day prn - 1 refill of each given today. Advised patient he should reach out to his PCP for more refills.   Xerosis secondary to isotretinoin therapy - Continue emollients as directed - Xyzal (levocetirizine) once a day and fish oil 1 gram daily may also help with dryness   Cheilitis secondary to isotretinoin therapy - Continue lip balm as directed, Dr. Clayborne Artist Cortibalm recommended   Long term medication management (isotretinoin).  Patient is using long term (months to years) prescription medication to control their dermatologic condition.  These medications require periodic monitoring to evaluate for efficacy and side effects and may require periodic laboratory monitoring.  - While taking Isotretinoin and for 30 days after you finish the medication, do not share pills, do not donate blood. It is very important that a women who could become  pregnant not take this medicine or get a blood transfusion with this medicine in it. Isotretinoin is best absorbed when taken with a fatty meal. Isotretinoin can make you sensitive to the sun. Daily careful sun protection including sunscreen SPF 30+ when outdoors is recommended. Hidradenitis suppurativa  Related Medications isotretinoin (ACCUTANE) 10 MG capsule Take 1 capsule (10 mg total) by mouth daily.    Return in about 1 month (around 03/25/2023) for Isotretinoin.  I, Joanie Coddington, CMA, am acting as scribe for Armida Sans, MD .   Documentation: I have reviewed the above documentation for accuracy and completeness, and I agree with the above.  Armida Sans, MD

## 2023-02-23 NOTE — Patient Instructions (Signed)

## 2023-03-15 ENCOUNTER — Ambulatory Visit: Payer: Medicaid Other | Admitting: Nurse Practitioner

## 2023-03-31 ENCOUNTER — Ambulatory Visit (INDEPENDENT_AMBULATORY_CARE_PROVIDER_SITE_OTHER): Payer: Medicaid Other | Admitting: Dermatology

## 2023-03-31 VITALS — Wt 166.0 lb

## 2023-03-31 DIAGNOSIS — K130 Diseases of lips: Secondary | ICD-10-CM

## 2023-03-31 DIAGNOSIS — Z79899 Other long term (current) drug therapy: Secondary | ICD-10-CM

## 2023-03-31 DIAGNOSIS — R208 Other disturbances of skin sensation: Secondary | ICD-10-CM

## 2023-03-31 DIAGNOSIS — Z7189 Other specified counseling: Secondary | ICD-10-CM

## 2023-03-31 DIAGNOSIS — L732 Hidradenitis suppurativa: Secondary | ICD-10-CM | POA: Diagnosis not present

## 2023-03-31 DIAGNOSIS — L853 Xerosis cutis: Secondary | ICD-10-CM | POA: Diagnosis not present

## 2023-03-31 MED ORDER — ISOTRETINOIN 10 MG PO CAPS: 10.0000 mg | ORAL_CAPSULE | Freq: Two times a day (BID) | ORAL | 0 refills | Status: DC

## 2023-03-31 NOTE — Progress Notes (Signed)
Isotretinoin Follow-Up Visit   Subjective  Caleb Brooks is a 62 y.o. male who presents for the following: Isotretinoin follow-up for HS groin, buttocks, Isotretinoin 10mg  1 po qd, pt was off ~ 1 week b/c he didn't have the money for his prescription  Week # 64   Isotretinoin F/U - 03/31/23 1200       Isotretinoin Follow Up   iPledge # [id number]    Date 03/31/23    Weight 166 lb (75.3 kg)    Acne breakouts since last visit? Yes      Dosage   Target Dosage (mg) 14520    Current (To Date) Dosage (mg) 6300    To Go Dosage (mg) 8220      Skin Side Effects   Dry Lips No    Nose bleeds No    Dry eyes No    Dry Skin No    Sunburn No      Gastrointestinal Side Effects   Nausea No    Diarrhea No    Blood in stool No      Neurological Side Effects   Blurred vision No    Depression No    Headache Yes    Homicidal thoughts No    Mood Changes Yes    Suicidal thoughts No      Constitutional Side Effects   Fatigue Yes      Musculoskeletal Side Effects   Muscle aches No              Side effects: Dry skin, dry lips  The following portions of the chart were reviewed this encounter and updated as appropriate: medications, allergies, medical history  Review of Systems:  No other skin or systemic complaints except as noted in HPI or Assessment and Plan.  Objective  Well appearing patient in no apparent distress; mood and affect are within normal limits.  An examination of the face, neck, chest, and back was performed and relevant findings are noted below.     Assessment & Plan   HIDRADENITIS SUPPURATIVA Patient is currently on Isotretinoin requiring FDA mandated monthly evaluations and laboratory monitoring. Condition is currently not to goal (must reach target dose based on weight and also have clear skin for 2 months prior to discontinuation in order to help prevent relapse)    Exam findings: Not examined today Week # 64 [facility]   iPLEDGE ##[id number]  Total mg -  6300 Total mg/kg - 83.7  Continue isotretinoin 10mg  1 po qd with fatty meal  Patient confirmed in iPledge and isotretinoin sent to pharmacy.   PAIN ASSOCIATED WITH HIDRADENITIS SUPPURATIVA   Treatment Plan: Continue Gabapentin 400 mg 1 time per day prn, Celebrex 100 mg 1 po every day prn -   Xerosis secondary to isotretinoin therapy - Continue emollients as directed - Xyzal (levocetirizine) once a day and fish oil 1 gram daily may also help with dryness   Cheilitis secondary to isotretinoin therapy - Continue lip balm as directed, Dr. Clayborne Artist Cortibalm recommended   Long term medication management (isotretinoin).  Patient is using long term (months to years) prescription medication to control their dermatologic condition.  These medications require periodic monitoring to evaluate for efficacy and side effects and may require periodic laboratory monitoring.  - While taking Isotretinoin and for 30 days after you finish the medication, do not share pills, do not donate blood. It is very important that a women who could become pregnant not take this medicine or  get a blood transfusion with this medicine in it. Isotretinoin is best absorbed when taken with a fatty meal. Isotretinoin can make you sensitive to the sun. Daily careful sun protection including sunscreen SPF 30+ when outdoors is recommended.  Follow-up in 5 weeks.  I, Ardis Rowan, RMA, am acting as scribe for Armida Sans, MD .   Documentation: I have reviewed the above documentation for accuracy and completeness, and I agree with the above.  Armida Sans, MD

## 2023-03-31 NOTE — Patient Instructions (Signed)

## 2023-04-08 ENCOUNTER — Other Ambulatory Visit: Payer: Self-pay | Admitting: Nurse Practitioner

## 2023-04-08 NOTE — Telephone Encounter (Signed)
Requested medication (s) are due for refill today: yes  Requested medication (s) are on the active medication list: yes  Last refill:  01/06/23  Future visit scheduled: yes  Notes to clinic:  Unable to refill per protocol, last refill by another provider.      Requested Prescriptions  Pending Prescriptions Disp Refills   celecoxib (CELEBREX) 100 MG capsule [Pharmacy Med Name: CELECOXIB 100 MG CAPSULE] 90 capsule     Sig: TAKE 1 CAPSULE BY MOUTH EVERY DAY     Analgesics:  COX2 Inhibitors Failed - 04/08/2023  2:39 AM      Failed - Manual Review: Labs are only required if the patient has taken medication for more than 8 weeks.      Passed - HGB in normal range and within 360 days    Hemoglobin  Date Value Ref Range Status  09/08/2022 16.3 13.0 - 17.7 g/dL Final         Passed - Cr in normal range and within 360 days    Creatinine, Ser  Date Value Ref Range Status  09/08/2022 0.78 0.76 - 1.27 mg/dL Final         Passed - HCT in normal range and within 360 days    Hematocrit  Date Value Ref Range Status  09/08/2022 47.5 37.5 - 51.0 % Final         Passed - AST in normal range and within 360 days    AST  Date Value Ref Range Status  09/08/2022 20 0 - 40 IU/L Final         Passed - ALT in normal range and within 360 days    ALT  Date Value Ref Range Status  09/08/2022 9 0 - 44 IU/L Final         Passed - eGFR is 30 or above and within 360 days    GFR calc Af Amer  Date Value Ref Range Status  03/14/2020 81 >59 mL/min/1.73 Final    Comment:    **[facility] currently reports eGFR in compliance with the current**   recommendations of the [employer]. [facility] will   update reporting as new guidelines are published from the NKF-ASN   Task force.    GFR, Estimated  Date Value Ref Range Status  11/22/2021 >60 >60 mL/min Final    Comment:    (NOTE) Calculated using the CKD-EPI Creatinine Equation (2021)    eGFR  Date Value Ref Range Status   09/08/2022 102 >59 mL/min/1.73 Final         Passed - Patient is not pregnant      Passed - Valid encounter within last 12 months    Recent Outpatient Visits           7 months ago Annual physical exam   [facility] Larae Grooms, NP   1 year ago Essential hypertension   [place] [facility] Larae Grooms, NP   1 year ago Epididymitis   [place] [facility] Larae Grooms, NP   1 year ago Epididymitis   [place] [facility] Vigg, Avanti, MD   1 year ago Seasonal allergic rhinitis due to pollen   [facility] Larae Grooms, NP       Future Appointments             In 5 days Larae Grooms, NP [place] [facility]   In 2 weeks Deirdre Evener, MD  [facility]

## 2023-04-11 ENCOUNTER — Other Ambulatory Visit: Payer: Self-pay | Admitting: Dermatology

## 2023-04-11 ENCOUNTER — Encounter: Payer: Self-pay | Admitting: Dermatology

## 2023-04-12 NOTE — Progress Notes (Unsigned)
There were no vitals taken for this visit.   Subjective:    Patient ID: Caleb Brooks, male    DOB: 1961/02/02, 62 y.o.   MRN: [id number]  HPI: Caleb Brooks is a 62 y.o. male  No chief complaint on file.  HYPERTENSION Hypertension status: controlled  Satisfied with current treatment? no Duration of hypertension: years BP monitoring frequency:  not checking BP range:  BP medication side effects:  no Medication compliance: excellent compliance Previous BP meds:amlodipine Aspirin: no Recurrent headaches: no Visual changes: no Palpitations: no Dyspnea: no Chest pain: no Lower extremity edema: no Dizzy/lightheaded: no  ALCOHOL Patient drinks about 66 ounces of beer daily.    SMOKING Patient smokes about 1/2 ppd.     Relevant past medical, surgical, family and social history reviewed and updated as indicated. Interim medical history since our last visit reviewed. Allergies and medications reviewed and updated.  Review of Systems  Eyes:  Negative for visual disturbance.  Respiratory:  Negative for shortness of breath.   Cardiovascular:  Negative for chest pain and leg swelling.  Neurological:  Negative for light-headedness and headaches.    Per HPI unless specifically indicated above     Objective:    There were no vitals taken for this visit.  Wt Readings from Last 3 Encounters:  03/31/23 166 lb (75.3 kg)  02/23/23 166 lb (75.3 kg)  01/06/23 166 lb (75.3 kg)    Physical Exam Vitals and nursing note reviewed.  Constitutional:      General: He is not in acute distress.    Appearance: Normal appearance. He is not ill-appearing, toxic-appearing or diaphoretic.  HENT:     Head: Normocephalic.     Right Ear: External ear normal.     Left Ear: External ear normal.     Nose: Nose normal. No congestion or rhinorrhea.     Mouth/Throat:     Mouth: Mucous membranes are moist.  Eyes:     General:        Right eye: No discharge.        Left eye: No discharge.      Extraocular Movements: Extraocular movements intact.     Conjunctiva/sclera: Conjunctivae normal.     Pupils: Pupils are equal, round, and reactive to light.  Cardiovascular:     Rate and Rhythm: Normal rate and regular rhythm.     Heart sounds: No murmur heard. Pulmonary:     Effort: Pulmonary effort is normal. No respiratory distress.     Breath sounds: Normal breath sounds. No wheezing, rhonchi or rales.  Abdominal:     General: Abdomen is flat. Bowel sounds are normal.  Musculoskeletal:     Cervical back: Normal range of motion and neck supple.  Skin:    General: Skin is warm and dry.     Capillary Refill: Capillary refill takes less than 2 seconds.  Neurological:     General: No focal deficit present.     Mental Status: He is alert and oriented to person, place, and time.  Psychiatric:        Mood and Affect: Mood normal.        Behavior: Behavior normal.        Thought Content: Thought content normal.        Judgment: Judgment normal.    Results for orders placed or performed in visit on 09/08/22  Microscopic Examination   Urine  Result Value Ref Range   WBC, UA None seen 0 - 5 /  hpf   RBC, Urine 0-2 0 - 2 /hpf   Epithelial Cells (non renal) None seen 0 - 10 /hpf   Bacteria, UA None seen None seen/Few  TSH  Result Value Ref Range   TSH 2.210 0.450 - 4.500 uIU/mL  PSA  Result Value Ref Range   Prostate Specific Ag, Serum 0.7 0.0 - 4.0 ng/mL  Lipid panel  Result Value Ref Range   Cholesterol, Total 151 100 - 199 mg/dL   Triglycerides 64 0 - 149 mg/dL   HDL 73 >52 mg/dL   VLDL Cholesterol Cal 13 5 - 40 mg/dL   LDL Chol Calc (NIH) 65 0 - 99 mg/dL   Chol/HDL Ratio 2.1 0.0 - 5.0 ratio  CBC with Differential/Platelet  Result Value Ref Range   WBC 5.7 3.4 - 10.8 x10E3/uL   RBC 5.10 4.14 - 5.80 x10E6/uL   Hemoglobin 16.3 13.0 - 17.7 g/dL   Hematocrit 84.1 32.4 - 51.0 %   MCV 93 79 - 97 fL   MCH 32.0 26.6 - 33.0 pg   MCHC 34.3 31.5 - 35.7 g/dL   RDW 40.1 02.7 -  25.3 %   Platelets 263 150 - 450 x10E3/uL   Neutrophils 47 Not Estab. %   Lymphs 38 Not Estab. %   Monocytes 12 Not Estab. %   Eos 2 Not Estab. %   Basos 1 Not Estab. %   Neutrophils Absolute 2.7 1.4 - 7.0 x10E3/uL   Lymphocytes Absolute 2.2 0.7 - 3.1 x10E3/uL   Monocytes Absolute 0.7 0.1 - 0.9 x10E3/uL   EOS (ABSOLUTE) 0.1 0.0 - 0.4 x10E3/uL   Basophils Absolute 0.0 0.0 - 0.2 x10E3/uL   Immature Granulocytes 0 Not Estab. %   Immature Grans (Abs) 0.0 0.0 - 0.1 x10E3/uL  Comprehensive metabolic panel  Result Value Ref Range   Glucose 83 70 - 99 mg/dL   BUN 7 (L) 8 - 27 mg/dL   Creatinine, Ser 6.64 0.76 - 1.27 mg/dL   eGFR 403 >47 QQ/VZD/6.38   BUN/Creatinine Ratio 9 (L) 10 - 24   Sodium 138 134 - 144 mmol/L   Potassium 4.0 3.5 - 5.2 mmol/L   Chloride 101 96 - 106 mmol/L   CO2 23 20 - 29 mmol/L   Calcium 9.2 8.6 - 10.2 mg/dL   Total Protein 7.8 6.0 - 8.5 g/dL   Albumin 4.1 3.8 - 4.9 g/dL   Globulin, Total 3.7 1.5 - 4.5 g/dL   Albumin/Globulin Ratio 1.1 (L) 1.2 - 2.2   Bilirubin Total 0.4 0.0 - 1.2 mg/dL   Alkaline Phosphatase 163 (H) 44 - 121 IU/L   AST 20 0 - 40 IU/L   ALT 9 0 - 44 IU/L  Urinalysis, Routine w reflex microscopic  Result Value Ref Range   Specific Gravity, UA >1.030 (H) 1.005 - 1.030   pH, UA 5.0 5.0 - 7.5   Color, UA Yellow Yellow   Appearance Ur Clear Clear   Leukocytes,UA Negative Negative   Protein,UA 1+ (A) Negative/Trace   Glucose, UA Negative Negative   Ketones, UA Negative Negative   RBC, UA Negative Negative   Bilirubin, UA CANCELED    Urobilinogen, Ur 1.0 0.2 - 1.0 mg/dL   Nitrite, UA Negative Negative   Microscopic Examination See below:   Cologuard  Result Value Ref Range   COLOGUARD Sample Could Not Be Processed 9 N/A      Assessment & Plan:   Problem List Items Addressed This Visit  Cardiovascular and Mediastinum   Essential hypertension     Musculoskeletal and Integument   Hidradenitis suppurativa     Other   Tobacco  abuse - Primary   ETOH abuse     Follow up plan: No follow-ups on file.

## 2023-04-13 ENCOUNTER — Ambulatory Visit (INDEPENDENT_AMBULATORY_CARE_PROVIDER_SITE_OTHER): Payer: Medicaid Other | Admitting: Nurse Practitioner

## 2023-04-13 ENCOUNTER — Encounter: Payer: Self-pay | Admitting: Nurse Practitioner

## 2023-04-13 ENCOUNTER — Other Ambulatory Visit: Payer: Self-pay | Admitting: Nurse Practitioner

## 2023-04-13 VITALS — BP 120/89 | HR 85 | Temp 98.7°F | Ht 71.0 in | Wt 166.4 lb

## 2023-04-13 DIAGNOSIS — F1010 Alcohol abuse, uncomplicated: Secondary | ICD-10-CM

## 2023-04-13 DIAGNOSIS — I10 Essential (primary) hypertension: Secondary | ICD-10-CM | POA: Diagnosis not present

## 2023-04-13 DIAGNOSIS — Z720 Tobacco use: Secondary | ICD-10-CM

## 2023-04-13 DIAGNOSIS — Z1211 Encounter for screening for malignant neoplasm of colon: Secondary | ICD-10-CM

## 2023-04-13 DIAGNOSIS — Z23 Encounter for immunization: Secondary | ICD-10-CM | POA: Diagnosis not present

## 2023-04-13 DIAGNOSIS — L732 Hidradenitis suppurativa: Secondary | ICD-10-CM | POA: Diagnosis not present

## 2023-04-13 MED ORDER — THIAMINE HCL 100 MG PO TABS: 100.0000 mg | ORAL_TABLET | Freq: Every day | ORAL | 1 refills | Status: DC

## 2023-04-13 MED ORDER — AMLODIPINE BESYLATE 5 MG PO TABS: 5.0000 mg | ORAL_TABLET | Freq: Every day | ORAL | 1 refills | Status: DC

## 2023-04-13 MED ORDER — FOLIC ACID 1 MG PO TABS: 1.0000 mg | ORAL_TABLET | Freq: Every day | ORAL | 1 refills | Status: DC

## 2023-04-13 NOTE — Assessment & Plan Note (Signed)
Current everyday smoker.  Not ready to quit. 

## 2023-04-13 NOTE — Assessment & Plan Note (Signed)
Continue to follow up with Dr. Gwen Pounds.

## 2023-04-13 NOTE — Assessment & Plan Note (Signed)
Current everyday drinker.  Drinks about 70 ounces of beer daily.

## 2023-04-13 NOTE — Assessment & Plan Note (Signed)
Chronic.  Controlled.  Continue with current medication regimen of Amlodipine daily.  Refills sent today.  Labs ordered today.  Return to clinic in 6 months for reevaluation.  Call sooner if concerns arise.

## 2023-04-14 LAB — CBC WITH DIFFERENTIAL/PLATELET
Basophils Absolute: 0 10*3/uL (ref 0.0–0.2)
Basos: 1 %
EOS (ABSOLUTE): 0.2 10*3/uL (ref 0.0–0.4)
Eos: 3 %
Hematocrit: 49.3 % (ref 37.5–51.0)
Hemoglobin: 16.8 g/dL (ref 13.0–17.7)
Immature Grans (Abs): 0 10*3/uL (ref 0.0–0.1)
Immature Granulocytes: 0 %
Lymphocytes Absolute: 1.8 10*3/uL (ref 0.7–3.1)
Lymphs: 25 %
MCH: 32.3 pg (ref 26.6–33.0)
MCHC: 34.1 g/dL (ref 31.5–35.7)
MCV: 95 fL (ref 79–97)
Monocytes Absolute: 0.8 10*3/uL (ref 0.1–0.9)
Monocytes: 11 %
Neutrophils Absolute: 4.2 10*3/uL (ref 1.4–7.0)
Neutrophils: 60 %
Platelets: 336 10*3/uL (ref 150–450)
RBC: 5.2 x10E6/uL (ref 4.14–5.80)
RDW: 13 % (ref 11.6–15.4)
WBC: 7 10*3/uL (ref 3.4–10.8)

## 2023-04-14 LAB — COMPREHENSIVE METABOLIC PANEL
ALT: 15 [IU]/L (ref 0–44)
AST: 27 [IU]/L (ref 0–40)
Albumin: 4.2 g/dL (ref 3.8–4.9)
Alkaline Phosphatase: 155 [IU]/L — ABNORMAL HIGH (ref 44–121)
BUN/Creatinine Ratio: 10 (ref 10–24)
BUN: 10 mg/dL (ref 8–27)
Bilirubin Total: 0.6 mg/dL (ref 0.0–1.2)
CO2: 21 mmol/L (ref 20–29)
Calcium: 9.7 mg/dL (ref 8.6–10.2)
Chloride: 101 mmol/L (ref 96–106)
Creatinine, Ser: 0.96 mg/dL (ref 0.76–1.27)
Globulin, Total: 4.1 g/dL (ref 1.5–4.5)
Glucose: 97 mg/dL (ref 70–99)
Potassium: 4.2 mmol/L (ref 3.5–5.2)
Sodium: 141 mmol/L (ref 134–144)
Total Protein: 8.3 g/dL (ref 6.0–8.5)
eGFR: 90 mL/min/{1.73_m2} (ref >59)

## 2023-04-14 NOTE — Telephone Encounter (Signed)
Requested Prescriptions  Pending Prescriptions Disp Refills   fluticasone (FLONASE) 50 MCG/ACT nasal spray [Pharmacy Med Name: FLUTICASONE PROP 50 MCG SPRAY] 16 mL 6    Sig: SPRAY 2 SPRAYS INTO EACH NOSTRIL EVERY DAY     Ear, Nose, and Throat: Nasal Preparations - Corticosteroids Passed - 04/13/2023  2:30 AM      Passed - Valid encounter within last 12 months    Recent Outpatient Visits           Yesterday Tobacco abuse   [place] [facility] Larae Grooms, NP   7 months ago Annual physical exam   [facility] Larae Grooms, NP   1 year ago Essential hypertension   [place] [facility] Larae Grooms, NP   1 year ago Epididymitis   [place] [facility] Larae Grooms, NP   1 year ago Epididymitis   [place] [facility] Vigg, Roma Schanz, MD       Future Appointments             In 2 weeks Deirdre Evener, MD [facility]   In 6 months Larae Grooms, NP [place] [facility]

## 2023-04-28 ENCOUNTER — Ambulatory Visit: Payer: Medicaid Other | Admitting: Dermatology

## 2023-05-12 LAB — COLOGUARD

## 2023-05-14 ENCOUNTER — Other Ambulatory Visit: Payer: Self-pay | Admitting: Dermatology

## 2023-05-14 DIAGNOSIS — L732 Hidradenitis suppurativa: Secondary | ICD-10-CM

## 2023-05-17 NOTE — Telephone Encounter (Signed)
Patient requesting refills, no future appointments, last seen in October.

## 2023-05-27 ENCOUNTER — Telehealth: Payer: Self-pay | Admitting: Nurse Practitioner

## 2023-05-27 NOTE — Telephone Encounter (Signed)
Pt states he attempted to pick up the [facility]olic acid ([facility]) [facility] and informed that it would be $22 instead of usual $4 copayment. Pt requesting cheaper alternative.

## 2023-05-28 NOTE — Telephone Encounter (Signed)
Contacted the pharmacy regarding the patients medication. Pharmacy states that the patients insurance will not cover the medication if the patient is over 21.

## 2023-05-31 NOTE — Telephone Encounter (Signed)
There is not a cheaper alternative.  Patient can look at over the counter options to see if they are cheaper.

## 2023-05-31 NOTE — Telephone Encounter (Signed)
Patient was notified via MyChart. Advised patient reach if he has any questions or concerns regarding Karen's recommendations.

## 2023-06-14 ENCOUNTER — Encounter: Payer: Self-pay | Admitting: Dermatology

## 2023-06-14 ENCOUNTER — Ambulatory Visit: Payer: Medicaid Other | Admitting: Dermatology

## 2023-06-14 VITALS — Wt 166.0 lb

## 2023-06-14 DIAGNOSIS — K130 Diseases of lips: Secondary | ICD-10-CM | POA: Diagnosis not present

## 2023-06-14 DIAGNOSIS — Z7189 Other specified counseling: Secondary | ICD-10-CM | POA: Diagnosis not present

## 2023-06-14 DIAGNOSIS — L853 Xerosis cutis: Secondary | ICD-10-CM

## 2023-06-14 DIAGNOSIS — L732 Hidradenitis suppurativa: Secondary | ICD-10-CM | POA: Diagnosis not present

## 2023-06-14 DIAGNOSIS — Z79899 Other long term (current) drug therapy: Secondary | ICD-10-CM

## 2023-06-14 MED ORDER — ISOTRETINOIN 10 MG PO CAPS: 10.0000 mg | ORAL_CAPSULE | Freq: Two times a day (BID) | ORAL | 0 refills | Status: DC

## 2023-06-14 NOTE — Progress Notes (Signed)
 Isotretinoin  Follow-Up Visit   Subjective  Caleb Brooks is a 63 y.o. male who presents for the following: Isotretinoin  follow-up and follow up on HS. Patient last seen in October. He reports he has been taking left over medication since we last seen him. Currently taking Isotretinoin  10 mg po qd.  Report flares have been off and on.   Week # 74   Isotretinoin  F/U - 06/14/23 1100       Isotretinoin  Follow Up   iPledge # [id number]    Date 06/14/23    Weight 166 lb (75.3 kg)    Acne breakouts since last visit? Yes      Dosage   Target Dosage (mg) 14520      Skin Side Effects   Dry Lips No    Nose bleeds No    Dry eyes No    Dry Skin No    Sunburn No      Gastrointestinal Side Effects   Nausea No    Diarrhea No    Blood in stool No      Neurological Side Effects   Blurred vision No    Depression No    Headache Yes    Homicidal thoughts No    Mood Changes Yes    Suicidal thoughts No      Constitutional Side Effects   Fatigue No      Musculoskeletal Side Effects   Muscle aches No            Side effects: Dry skin, dry lips  Patient is not pregnant, not seeking pregnancy, and not breastfeeding.   The following portions of the chart were reviewed this encounter and updated as appropriate: medications, allergies, medical history  Review of Systems:  No other skin or systemic complaints except as noted in HPI or Assessment and Plan.  Objective  Well appearing patient in no apparent distress; mood and affect are within normal limits.  An examination of the face, neck, chest, and back was performed and relevant findings are noted below.    Assessment & Plan   HIDRADENITIS SUPPURATIVA Patient is currently on Isotretinoin  requiring FDA mandated monthly evaluations and laboratory monitoring. Condition is currently not to goal (must reach target dose based on weight and also have clear skin for 2 months prior to discontinuation in order to help prevent  relapse)  He reports he has been taking left over medication since we last seen him. Currently taking Isotretinoin  10 mg po qd.  Report flares have been off and on.  Overall he feels the Isotretinoin  has helped his HS significantly, but cannot go higher than 10 mg dose due to moodiness. He has history of depression.  Patient re-registered in Ipledge system   Exam findings: Cyst at underside of chin  Week # 74 [facility] i[facility]GE # [id number] Total mg -  6600 Total mg/kg -  87.65   Continue isotretinoin  10 mg po qd with fatty meal  Hidradenitis Suppurativa is a chronic; persistent; non-curable, but treatable condition due to abnormal inflamed sweat glands in the body folds (axilla, inframammary, groin, medial thighs), causing recurrent painful draining cysts and scarring. It can be associated with severe scarring acne and cysts; also abscesses and scarring of scalp. The goal is control and prevention of flares, as it is not curable. Scars are permanent and can be thickened. Treatment may include daily use of topical medication and oral antibiotics.  Oral isotretinoin  may also be helpful.  For some cases,  Humira  or Cosentyx (biologic injections) may be prescribed to decrease the inflammatory process and prevent flares.  When indicated, inflamed cysts may also be treated surgically.  PAIN ASSOCIATED WITH HIDRADENITIS SUPPURATIVA   Treatment Plan: Continue Gabapentin  400 mg 1 time per day prn, Celebrex  100 mg 1 po every day prn -    Patient confirmed in iPledge and isotretinoin  sent to pharmacy.   Isotretinoin  Counseling; Review and Contraception Counseling: Reviewed potential side effects of isotretinoin  including xerosis, cheilitis, hepatitis, hyperlipidemia, and severe birth defects if taken by a pregnant woman.  Women on isotretinoin  must be celibate (not having sex) or required to use at least 2 birth control methods to prevent pregnancy (unless patient is a male of  non-child bearing potential).  Females of child-bearing potential must have monthly pregnancy tests while on isotretinoin  and report through I-Pledge (FDA monitoring program). Reviewed reports of suicidal ideation in those with a history of depression while taking isotretinoin  and reports of diagnosis of inflammatory bowl disease (IBD) while taking isotretinoin  as well as the lack of evidence for a causal relationship between isotretinoin , depression and IBD. Patient advised to reach out with any questions or concerns. Patient advised not to share pills or donate blood while on treatment or for one month after completing treatment. All patient's considering Isotretinoin  must read and understand and sign Isotretinoin  Consent Form and be registered with I-Pledge.  Xerosis secondary to isotretinoin  therapy - Continue emollients as directed - Xyzal (levocetirizine) once a day and fish oil 1 gram daily may also help with dryness   Cheilitis secondary to isotretinoin  therapy - Continue lip balm as directed, Dr. Horald Cortibalm recommended   Long term medication management (isotretinoin )  Patient is using long term (months to years) prescription medication  to control their dermatologic condition.  These medications require periodic monitoring to evaluate for efficacy and side effects and may require periodic laboratory monitoring.  - While taking Isotretinoin  and for 30 days after you finish the medication, do not get pregnant, do not share pills, do not donate blood. Isotretinoin  is best absorbed when taken with a fatty meal. Isotretinoin  can make you sensitive to the sun. Daily careful sun protection including sunscreen SPF 30+ when outdoors is recommended.  Follow-up in 30 days.  IEleanor Blush, CMA, am acting as scribe for Alm Rhyme, MD.   Documentation: I have reviewed the above documentation for accuracy and completeness, and I agree with the above.  Alm Rhyme, MD

## 2023-06-14 NOTE — Patient Instructions (Signed)

## 2023-07-12 ENCOUNTER — Ambulatory Visit (INDEPENDENT_AMBULATORY_CARE_PROVIDER_SITE_OTHER): Payer: Medicaid Other | Admitting: Dermatology

## 2023-07-12 ENCOUNTER — Encounter: Payer: Self-pay | Admitting: Dermatology

## 2023-07-12 VITALS — Wt 166.0 lb

## 2023-07-12 DIAGNOSIS — L820 Inflamed seborrheic keratosis: Secondary | ICD-10-CM

## 2023-07-12 DIAGNOSIS — L732 Hidradenitis suppurativa: Secondary | ICD-10-CM

## 2023-07-12 DIAGNOSIS — K130 Diseases of lips: Secondary | ICD-10-CM | POA: Diagnosis not present

## 2023-07-12 DIAGNOSIS — Z79899 Other long term (current) drug therapy: Secondary | ICD-10-CM

## 2023-07-12 DIAGNOSIS — L853 Xerosis cutis: Secondary | ICD-10-CM

## 2023-07-12 MED ORDER — CELECOXIB 100 MG PO CAPS: 100.0000 mg | ORAL_CAPSULE | Freq: Every day | ORAL | 1 refills | Status: DC

## 2023-07-12 MED ORDER — ISOTRETINOIN 10 MG PO CAPS: 10.0000 mg | ORAL_CAPSULE | Freq: Two times a day (BID) | ORAL | 0 refills | Status: DC

## 2023-07-12 MED ORDER — GABAPENTIN 400 MG PO CAPS: 400.0000 mg | ORAL_CAPSULE | Freq: Three times a day (TID) | ORAL | 1 refills | Status: DC

## 2023-07-12 NOTE — Progress Notes (Signed)
Isotretinoin Follow-Up Visit   Subjective  Caleb Brooks is a 63 y.o. male who presents for the following: Isotretinoin follow-up. Patient taking gabapentin 400 mg every day and Celebrex 100 mg every day prn for pain associated with Hidradenitis.   Week # 78    Isotretinoin F/U - 07/12/23 1100       Isotretinoin Follow Up   iPledge # [id number]    Date 07/12/23    Weight 166 lb (75.3 kg)    Acne breakouts since last visit? Yes      Dosage   Target Dosage (mg) 14520    Current (To Date) Dosage (mg) 6900    To Go Dosage (mg) 7620      Skin Side Effects   Dry Lips No    Nose bleeds No    Dry eyes No    Dry Skin No    Sunburn No      Gastrointestinal Side Effects   Nausea No    Diarrhea No    Blood in stool No      Neurological Side Effects   Blurred vision No    Depression No              Side effects: Dry skin, dry lips  The following portions of the chart were reviewed this encounter and updated as appropriate: medications, allergies, medical history  Review of Systems:  No other skin or systemic complaints except as noted in HPI or Assessment and Plan.  Objective  Well appearing patient in no apparent distress; mood and affect are within normal limits.  An examination of the face, neck, chest, and back was performed and relevant findings are noted below.   Left Thigh proximal medial Erythematous stuck-on, waxy papule or plaque  Assessment & Plan   INFLAMED SEBORRHEIC KERATOSIS Left Thigh proximal medial Symptomatic, irritating, patient would like treated.  Benign-appearing.  Call clinic for new or changing lesions.   Destruction of lesion - Left Thigh proximal medial Complexity: simple   Destruction method: cryotherapy   Informed consent: discussed and consent obtained   Timeout:  patient name, date of birth, surgical site, and procedure verified Lesion destroyed using liquid nitrogen: Yes   Region frozen until ice ball extended beyond  lesion: Yes   Cryo cycles: 1 or 2. Outcome: patient tolerated procedure well with no complications   Post-procedure details: wound care instructions given   HIDRADENITIS SUPPURATIVA   Related Medications mometasone (ELOCON) 0.1 % cream APPLY 1 APPLICATION TOPICALLY DAILY AS NEEDED (RASH). gabapentin (NEURONTIN) 400 MG capsule Take 1 capsule (400 mg total) by mouth 3 (three) times daily. celecoxib (CELEBREX) 100 MG capsule Take 1 capsule (100 mg total) by mouth daily. isotretinoin (ACCUTANE) 10 MG capsule Take 1 capsule (10 mg total) by mouth 2 (two) times daily.  HIDRADENITIS SUPPURATIVA , chronic stable Patient is currently on Isotretinoin requiring FDA mandated monthly evaluations and laboratory monitoring. Condition is currently not to goal (must reach target dose based on weight and also have clear skin for 2 months prior to discontinuation in order to help prevent relapse)  Patient cannot go higher than 10 mg dose due to moodiness. He has history of depression. Advises that 06/29/23 he passed out after feeling dizzy and is unsure if related to isotretinoin.   Exam findings: Sinus tracts in left inguinal crease  Week # 78 Pharmacy [facility] # [id number] Total mg -  6900 Total mg/kg -  91.63 mg/kg  Continue isotretinoin 10 mg  every day   Patient confirmed in iPledge and isotretinoin sent to pharmacy.   PAIN ASSOCIATED WITH HIDRADENITIS SUPPURATIVA   Treatment Plan: Refill if Dr Gwen Pounds thinks is appropriate: Gabapentin 400 mg 1 time per day prn, Celebrex 100 mg 1 po every day prn -   Xerosis secondary to isotretinoin therapy - Continue emollients as directed - Xyzal (levocetirizine) once a day and fish oil 1 gram daily may also help with dryness   Cheilitis secondary to isotretinoin therapy - Continue lip balm as directed, Dr. Clayborne Artist Cortibalm recommended   Long term medication management (isotretinoin).  Patient is using long term (months to years)  prescription medication  to control their dermatologic condition.  These medications require periodic monitoring to evaluate for efficacy and side effects and may require periodic laboratory monitoring.  - While taking Isotretinoin and for 30 days after you finish the medication, do not share pills, do not donate blood. It is very important that a women who could become pregnant not take this medicine or get a blood transfusion with this medicine in it. Isotretinoin is best absorbed when taken with a fatty meal. Isotretinoin can make you sensitive to the sun. Daily careful sun protection including sunscreen SPF 30+ when outdoors is recommended.  Follow-up in 30 days.  Anise Salvo, RMA, am acting as scribe for Elie Goody, MD .   Documentation: I have reviewed the above documentation for accuracy and completeness, and I agree with the above.  Elie Goody, MD

## 2023-07-12 NOTE — Patient Instructions (Signed)

## 2023-08-11 ENCOUNTER — Encounter: Payer: Self-pay | Admitting: Dermatology

## 2023-08-11 ENCOUNTER — Ambulatory Visit: Payer: Medicaid Other | Admitting: Dermatology

## 2023-08-11 ENCOUNTER — Ambulatory Visit (INDEPENDENT_AMBULATORY_CARE_PROVIDER_SITE_OTHER): Payer: Medicaid Other | Admitting: Dermatology

## 2023-08-11 VITALS — Wt 166.0 lb

## 2023-08-11 DIAGNOSIS — K130 Diseases of lips: Secondary | ICD-10-CM

## 2023-08-11 DIAGNOSIS — Z79899 Other long term (current) drug therapy: Secondary | ICD-10-CM | POA: Diagnosis not present

## 2023-08-11 DIAGNOSIS — L853 Xerosis cutis: Secondary | ICD-10-CM

## 2023-08-11 DIAGNOSIS — Z7189 Other specified counseling: Secondary | ICD-10-CM

## 2023-08-11 DIAGNOSIS — L732 Hidradenitis suppurativa: Secondary | ICD-10-CM

## 2023-08-11 DIAGNOSIS — R208 Other disturbances of skin sensation: Secondary | ICD-10-CM

## 2023-08-11 MED ORDER — ISOTRETINOIN 10 MG PO CAPS: 10.0000 mg | ORAL_CAPSULE | Freq: Every day | ORAL | 0 refills | Status: DC

## 2023-08-11 MED ORDER — GABAPENTIN 400 MG PO CAPS: 400.0000 mg | ORAL_CAPSULE | Freq: Three times a day (TID) | ORAL | 1 refills | Status: DC

## 2023-08-11 NOTE — Progress Notes (Signed)
 Isotretinoin Follow-Up Visit   Subjective  Caleb Brooks is a 63 y.o. male who presents for the following: Isotretinoin follow-up. Patient taking gabapentin 400 mg every day and Celebrex 100 mg every day prn for pain associated with Hidradenitis. Taking Isotretinoin 10 mg daily. Cannot increase due to mood changes. Needs Rf of Gabapentin.   Week # 82    Isotretinoin F/U - 08/11/23 1400       Isotretinoin Follow Up   iPledge # [id number]    Date 08/11/23    Weight 166 lb (75.3 kg)    Acne breakouts since last visit? Yes      Dosage   Target Dosage (mg) 14520    Current (To Date) Dosage (mg) 7200    To Go Dosage (mg) 7320             Side effects: Dry skin, dry lips  The following portions of the chart were reviewed this encounter and updated as appropriate: medications, allergies, medical history  Review of Systems:  No other skin or systemic complaints except as noted in HPI or Assessment and Plan.  Objective  Well appearing patient in no apparent distress; mood and affect are within normal limits.  An examination of the face, neck, chest, and back was performed and relevant findings are noted below.     Assessment & Plan   HIDRADENITIS SUPPURATIVA   Related Medications mometasone (ELOCON) 0.1 % cream APPLY 1 APPLICATION TOPICALLY DAILY AS NEEDED (RASH). celecoxib (CELEBREX) 100 MG capsule Take 1 capsule (100 mg total) by mouth daily.  HIDRADENITIS SUPPURATIVA , chronic stable Patient is currently on Isotretinoin requiring FDA mandated monthly evaluations and laboratory monitoring. Condition is currently not to goal (must reach target dose based on weight and also have clear skin for 2 months prior to discontinuation in order to help prevent relapse)  Patient cannot go higher than 10 mg dose due to moodiness. He has history of depression. Advises that 06/29/23 he passed out after feeling dizzy and is unsure if related to isotretinoin.   Exam  findings: Sinus tracts in left inguinal crease  Week # 82 [facility]acy [facility] [id number] Total mg -  7200 Total mg/kg -  95.61 mg/kg  Continue isotretinoin 10 mg every day   Patient confirmed in iPledge and isotretinoin sent to pharmacy.   PAIN ASSOCIATED WITH HIDRADENITIS SUPPURATIVA Treatment Plan: Gabapentin 400 mg 1 time per day prn, Celebrex 100 mg 1 po every day prn -  Goes back to pain doctor in May. Advised patient to request future prescriptions for Gabapentin from pain doctor.  Xerosis secondary to isotretinoin therapy - Continue emollients as directed - Xyzal (levocetirizine) once a day and fish oil 1 gram daily may also help with dryness  Cheilitis secondary to isotretinoin therapy - Continue lip balm as directed, Dr. Clayborne Artist Cortibalm recommended  Long term medication management (isotretinoin).  Patient is using long term (months to years) prescription medication  to control their dermatologic condition.  These medications require periodic monitoring to evaluate for efficacy and side effects and may require periodic laboratory monitoring.  - While taking Isotretinoin and for 30 days after you finish the medication, do not share pills, do not donate blood. It is very important that a women who could become pregnant not take this medicine or get a blood transfusion with this medicine in it. Isotretinoin is best absorbed when taken with a fatty meal. Isotretinoin can make you sensitive to the sun. Daily careful  sun protection including sunscreen SPF 30+ when outdoors is recommended.  Follow-up in 30 days.  I, Lawson Radar, CMA, am acting as scribe for Armida Sans, MD.    Documentation: I have reviewed the above documentation for accuracy and completeness, and I agree with the above.  Armida Sans, MD

## 2023-08-11 NOTE — Patient Instructions (Signed)
 While taking Isotretinoin and for 30 days after you finish the medication, do not share pills, do not donate blood. It is very important that a women who could become pregnant not take this medicine or get a blood transfusion with this medicine in it. Isotretinoin is best absorbed when taken with a fatty meal. Isotretinoin can make you sensitive to the sun. Daily careful sun protection including sunscreen SPF 30+ when outdoors is recommended.      Due to recent changes in healthcare laws, you may see results of your pathology and/or laboratory studies on MyChart before the doctors have had a chance to review them. We understand that in some cases there may be results that are confusing or concerning to you. Please understand that not all results are received at the same time and often the doctors may need to interpret multiple results in order to provide you with the best plan of care or course of treatment. Therefore, we ask that you please give [place] 2 business days to thoroughly review all your results before contacting the office for clarification. Should we see a critical lab result, you will be contacted sooner.   If You Need Anything After Your Visit  If you have any questions or concerns for your doctor, please call our main line at [phone] and press option 4 to reach your doctor's medical assistant. If no one answers, please leave a voicemail as directed and we will return your call as soon as possible. Messages left after 4 pm will be answered the following business day.   You may also send [place] a message via MyChart. We typically respond to MyChart messages within 1-2 business days.  For prescription refills, please ask your pharmacy to contact our office. Our fax number is [phone].  If you have an urgent issue when the clinic is closed that cannot wait until the next business day, you can page your doctor at the number below.    Please note that while we do our best to be  available for urgent issues outside of office hours, we are not available 24/7.   If you have an urgent issue and are unable to reach [place], you may choose to seek medical care at your doctor's office, retail clinic, urgent care center, or emergency room.  If you have a medical emergency, please immediately call 911 or go to the emergency department.  Pager Numbers  - Dr. Gwen Pounds: [phone]  - Dr. Roseanne Reno: [phone]  - Dr. Katrinka Blazing: [phone]   In the event of inclement weather, please call our main line at [phone] for an update on the status of any delays or closures.  Dermatology Medication Tips: Please keep the boxes that topical medications come in in order to help keep track of the instructions about where and how to use these. Pharmacies typically print the medication instructions only on the boxes and not directly on the medication tubes.   If your medication is too expensive, please contact our office at [phone] option 4 or send [place] a message through MyChart.   We are unable to tell what your co-pay for medications will be in advance as this is different depending on your insurance coverage. However, we may be able to find a substitute medication at lower cost or fill out paperwork to get insurance to cover a needed medication.   If a prior authorization is required to get your medication covered by your insurance company, please allow [place] 1-2 business days to complete this  process.  Drug prices often vary depending on where the prescription is filled and some pharmacies may offer cheaper prices.  The website www.goodrx.com contains coupons for medications through different pharmacies. The prices here do not account for what the cost may be with help from insurance (it may be cheaper with your insurance), but the website can give you the price if you did not use any insurance.  - You can print the associated coupon and take it with your prescription to the pharmacy.   - You may also stop by our office during regular business hours and pick up a GoodRx coupon card.  - If you need your prescription sent electronically to a different pharmacy, notify our office through [facility] or by phone at [phone] option 4.     Si Usted Necesita Algo Despus de Su Visita  Tambin puede enviarnos un mensaje a travs de Clinical cytogeneticist. Por lo general respondemos a los mensajes de MyChart en el transcurso de 1 a 2 das hbiles.  Para renovar recetas, por favor pida a su farmacia que se ponga en contacto con nuestra oficina. Annie Sable de fax es [place] [phone].  Si tiene un asunto urgente cuando la clnica est cerrada y que no puede esperar hasta el siguiente da hbil, puede llamar/localizar a su doctor(a) al nmero que aparece a continuacin.   Por favor, tenga en cuenta que aunque hacemos todo lo posible para estar disponibles para asuntos urgentes fuera del horario de [place], no estamos disponibles las 24 horas del da, los 7 [address]  [address] de la [place].   Si tiene un problema urgente y no puede comunicarse con nosotros, puede optar por buscar atencin mdica  en el consultorio de su doctor(a), en una clnica privada, en un centro de atencin urgente o en una sala de emergencias.  Si tiene Engineer, drilling, por favor llame inmediatamente al 911 o vaya a la sala de emergencias.  Nmeros de bper  - Dr. Gwen Pounds: [phone]  - Dra. Roseanne Reno: [phone]  - Dr. Katrinka Blazing: [phone]   En caso de inclemencias del tiempo, por favor llame a Lacy Duverney principal al [phone] para una actualizacin sobre el [place] de cualquier retraso o cierre.  Consejos para la medicacin en dermatologa: Por favor, guarde las cajas en las que vienen los medicamentos de uso tpico para ayudarle a seguir las instrucciones sobre dnde y cmo usarlos. Las farmacias generalmente imprimen las instrucciones del medicamento slo en las cajas y no directamente en los tubos del  [place].   Si su medicamento es muy caro, por favor, pngase en contacto con Rolm Gala llamando al [phone] y presione la opcin 4 o envenos un mensaje a travs de Clinical cytogeneticist.   No podemos decirle cul ser su copago por los medicamentos por adelantado ya que esto es diferente dependiendo de la cobertura de su seguro. Sin embargo, es posible que podamos encontrar un medicamento sustituto a Audiological scientist un formulario para que el seguro cubra el medicamento que se considera necesario.   Si se requiere una autorizacin previa para que su compaa de seguros [place] su medicamento, por favor permtanos de 1 a 2 das hbiles para completar [address]  Los precios de los medicamentos varan con frecuencia dependiendo del Environmental consultant de dnde se surte la receta y alguna farmacias pueden ofrecer precios ms baratos.  El sitio web www.goodrx.com tiene cupones para medicamentos de Health and safety inspector. Los precios aqu no tienen en cuenta lo que podra costar con la ayuda del seguro (  puede ser ms barato con su seguro), pero el sitio web puede darle el precio si no Visual merchandiser.  - Puede imprimir el cupn correspondiente y llevarlo con su receta a la farmacia.  - Tambin puede pasar por nuestra oficina durante el horario de atencin regular y Education officer, museum una tarjeta de cupones de GoodRx.  - Si necesita que su receta se enve electrnicamente a una farmacia diferente, informe a nuestra oficina a travs de MyChart de [place] o por telfono llamando al [phone] y presione la opcin 4.

## 2023-09-15 ENCOUNTER — Ambulatory Visit: Admitting: Dermatology

## 2023-09-21 ENCOUNTER — Ambulatory Visit (INDEPENDENT_AMBULATORY_CARE_PROVIDER_SITE_OTHER): Admitting: Dermatology

## 2023-09-21 ENCOUNTER — Encounter: Payer: Self-pay | Admitting: Dermatology

## 2023-09-21 VITALS — Wt 166.0 lb

## 2023-09-21 DIAGNOSIS — K130 Diseases of lips: Secondary | ICD-10-CM

## 2023-09-21 DIAGNOSIS — L738 Other specified follicular disorders: Secondary | ICD-10-CM | POA: Diagnosis not present

## 2023-09-21 DIAGNOSIS — L853 Xerosis cutis: Secondary | ICD-10-CM | POA: Diagnosis not present

## 2023-09-21 DIAGNOSIS — Z7189 Other specified counseling: Secondary | ICD-10-CM

## 2023-09-21 DIAGNOSIS — L732 Hidradenitis suppurativa: Secondary | ICD-10-CM

## 2023-09-21 DIAGNOSIS — Z79899 Other long term (current) drug therapy: Secondary | ICD-10-CM

## 2023-09-21 MED ORDER — BIMZELX 320 MG/2ML ~~LOC~~ SOAJ: 320.0000 mg | SUBCUTANEOUS | 5 refills | Status: AC

## 2023-09-21 MED ORDER — MOMETASONE FUROATE 0.1 % EX CREA: 1.0000 | TOPICAL_CREAM | Freq: Every day | CUTANEOUS | 1 refills | Status: AC | PRN

## 2023-09-21 MED ORDER — BIMEKIZUMAB-BKZX 320 MG/2ML ~~LOC~~ SOAJ: 320.0000 mg | SUBCUTANEOUS | 4 refills | Status: AC

## 2023-09-21 NOTE — Patient Instructions (Signed)

## 2023-09-21 NOTE — Progress Notes (Signed)
 Isotretinoin Follow-Up Visit   Subjective  Caleb Brooks is a 63 y.o. male who presents for the following: Isotretinoin follow-up  Week # 86   Isotretinoin F/U - 09/21/23 1300       Isotretinoin Follow Up   iPledge # [id number]    Date 09/21/23    Weight 166 lb (75.3 kg)    Acne breakouts since last visit? Yes      Dosage   Target Dosage (mg) 14520    Current (To Date) Dosage (mg) 7500    To Go Dosage (mg) 7020      Skin Side Effects   Dry Lips Yes    Nose bleeds No    Dry eyes No    Dry Skin Yes    Sunburn No      Gastrointestinal Side Effects   Nausea No    Diarrhea No    Blood in stool No      Neurological Side Effects   Blurred vision No    Depression Yes    Headache No    Homicidal thoughts No    Mood Changes Yes    Suicidal thoughts No      Constitutional Side Effects   Fatigue No      Musculoskeletal Side Effects   Muscle aches Yes             Side effects: Dry skin, dry lips  The following portions of the chart were reviewed this encounter and updated as appropriate: medications, allergies, medical history  Review of Systems:  No other skin or systemic complaints except as noted in HPI or Assessment and Plan.  Objective  Well appearing patient in no apparent distress; mood and affect are within normal limits.  An examination of the face, neck, chest, and back was performed and relevant findings are noted below.    Assessment & Plan    HIDRADENITIS SUPPURATIVA Exam: see previous exam  Chronic and persistent condition with duration or expected duration over one year. Condition is symptomatic/ bothersome to patient. Not currently at goal.   Hidradenitis Suppurativa is a chronic; persistent; non-curable, but treatable condition due to abnormal inflamed sweat glands in the body folds (axilla, inframammary, groin, medial thighs), causing recurrent painful draining cysts and scarring. It can be associated with severe scarring acne and  cysts; also abscesses and scarring of scalp. The goal is control and prevention of flares, as it is not curable. Scars are permanent and can be thickened. Treatment may include daily use of topical medication and oral antibiotics.  Oral isotretinoin may also be helpful.  For some cases, Humira or Cosentyx (biologic injections) may be prescribed to decrease the inflammatory process and prevent flares.  When indicated, inflamed cysts may also be treated surgically.  Week # 86 [facility]acy [facility] # [id number]  Total mg -  7,500mg  Total mg/kg - 100mg /kg  Finish the remainder of Isotretinoin 10mg  taking 1 po every other day, then will be done with Isotretinoin course due to having some depression   Cont Mometasone cr qd up to 5d/wk aa buttock prn itch  Pending insurance approval start Bimzelx sq injections  Topical steroids (such as triamcinolone, fluocinolone, fluocinonide, mometasone, clobetasol, halobetasol, betamethasone, hydrocortisone) can cause thinning and lightening of the skin if they are used for too long in the same area. Your physician has selected the right strength medicine for your problem and area affected on the body. Please use your medication only as directed by your physician  to prevent side effects.    Reviewed risks of biologics including immunosuppression, infections, injection site reaction, and failure to improve condition. Goal is control of skin condition, not cure.  Some older biologics such as Humira and Enbrel may slightly increase risk of malignancy and may worsen congestive heart failure.  Taltz and Cosentyx may cause inflammatory bowel disease to flare. The use of biologics requires long term medication management, including periodic office visits and monitoring of blood work.  Xerosis secondary to isotretinoin therapy - Continue emollients as directed - Xyzal (levocetirizine) once a day and fish oil 1 gram daily may also help with dryness  Cheilitis  secondary to isotretinoin therapy - Continue lip balm as directed, Dr. Suzann Ernst Cortibalm recommended  Long term medication management (isotretinoin).  Patient is using long term (months to years) prescription medication to control their dermatologic condition.  These medications require periodic monitoring to evaluate for efficacy and side effects and may require periodic laboratory monitoring.  - While taking Isotretinoin and for 30 days after you finish the medication, do not share pills, do not donate blood. It is very important that a women who could become pregnant not take this medicine or get a blood transfusion with this medicine in it. Isotretinoin is best absorbed when taken with a fatty meal. Isotretinoin can make you sensitive to the sun. Daily careful sun protection including sunscreen SPF 30+ when outdoors is recommended.  Sebaceous Hyperplasia chest - Small yellow papules with a central dell - Benign-appearing - Observe. Call for changes.   Follow-up in 30 days.  I, Rollie Clipper, RMA, am acting as scribe for Celine Collard, MD .   Documentation: I have reviewed the above documentation for accuracy and completeness, and I agree with the above.  Celine Collard, MD

## 2023-10-05 ENCOUNTER — Telehealth: Payer: Self-pay

## 2023-10-05 DIAGNOSIS — Z79899 Other long term (current) drug therapy: Secondary | ICD-10-CM

## 2023-10-05 DIAGNOSIS — L732 Hidradenitis suppurativa: Secondary | ICD-10-CM

## 2023-10-05 NOTE — Telephone Encounter (Signed)
 Pharmacy is asking for most recent copy of TB test results.  Is this not required for Bimzelx?

## 2023-10-06 NOTE — Telephone Encounter (Signed)
 Lab test put into system. Patient advised. aw

## 2023-10-11 ENCOUNTER — Ambulatory Visit: Payer: Self-pay | Admitting: Nurse Practitioner

## 2023-10-11 ENCOUNTER — Encounter: Payer: Self-pay | Admitting: Nurse Practitioner

## 2023-10-11 VITALS — BP 128/78 | HR 61 | Temp 98.0°F | Wt 158.8 lb

## 2023-10-11 DIAGNOSIS — Z0000 Encounter for general adult medical examination without abnormal findings: Secondary | ICD-10-CM | POA: Diagnosis not present

## 2023-10-11 DIAGNOSIS — Z1330 Encounter for screening examination for mental health and behavioral disorders, unspecified: Secondary | ICD-10-CM | POA: Diagnosis not present

## 2023-10-11 DIAGNOSIS — F1010 Alcohol abuse, uncomplicated: Secondary | ICD-10-CM | POA: Diagnosis not present

## 2023-10-11 DIAGNOSIS — M10471 Other secondary gout, right ankle and foot: Secondary | ICD-10-CM

## 2023-10-11 DIAGNOSIS — Z136 Encounter for screening for cardiovascular disorders: Secondary | ICD-10-CM

## 2023-10-11 DIAGNOSIS — L732 Hidradenitis suppurativa: Secondary | ICD-10-CM | POA: Diagnosis not present

## 2023-10-11 DIAGNOSIS — I10 Essential (primary) hypertension: Secondary | ICD-10-CM

## 2023-10-11 MED ORDER — AMLODIPINE BESYLATE 5 MG PO TABS: 5.0000 mg | ORAL_TABLET | Freq: Every day | ORAL | 1 refills | Status: DC

## 2023-10-11 MED ORDER — METHYLPREDNISOLONE 4 MG PO TBPK: ORAL_TABLET | ORAL | 0 refills | Status: DC

## 2023-10-11 MED ORDER — THIAMINE HCL 100 MG PO TABS: 100.0000 mg | ORAL_TABLET | Freq: Every day | ORAL | 1 refills | Status: DC

## 2023-10-11 MED ORDER — FLUTICASONE PROPIONATE 50 MCG/ACT NA SUSP: 1.0000 | Freq: Every day | NASAL | 6 refills | Status: AC

## 2023-10-11 MED ORDER — CETIRIZINE HCL 10 MG PO TABS: 10.0000 mg | ORAL_TABLET | Freq: Every day | ORAL | 1 refills | Status: DC

## 2023-10-11 NOTE — Assessment & Plan Note (Signed)
 Labs ordered that are requested for Dermatology.  Continue to follow up with specialist.

## 2023-10-11 NOTE — Progress Notes (Signed)
 BP 128/78   Pulse 61   Temp 98 F (36.7 C) (Oral)   Wt 158 lb 12.8 oz (72 kg)   SpO2 99%   BMI 22.15 kg/m    Subjective:    Patient ID: Caleb Brooks, male    DOB: 1961-01-09, 63 y.o.   MRN: [id number]  HPI: Caleb Brooks is a 63 y.o. male presenting on 10/11/2023 for comprehensive medical examination. Current medical complaints include:none  He currently lives with: Interim Problems from his last visit: no  HYPERTENSION Hypertension status: controlled  Satisfied with current treatment? no Duration of hypertension: years BP monitoring frequency:  not checking BP range:  BP medication side effects:  no Medication compliance: excellent compliance Previous BP meds:amlodipine  Aspirin: no Recurrent headaches: no Visual changes: no Palpitations: no Dyspnea: no Chest pain: no Lower extremity edema: no Dizzy/lightheaded: no  Still seeing Dr. Bary Likes for hidradenitis.  He is currently drinking 20 ounces of beer daily.   Patient is having a gout flare on the right foot and ankle.  .  This has been going on for about two weeks.  He is still having some swelling.   Depression Screen done today and results listed below:     10/11/2023   10:57 AM 09/08/2022    3:54 PM 01/16/2022    2:48 PM 12/22/2021    3:04 PM 08/04/2021    2:43 PM  Depression screen PHQ 2/9  Decreased Interest 0 3 1 2 3   Down, Depressed, Hopeless 0 3 1 2 3   PHQ - 2 Score 0 6 2 4 6   Altered sleeping 0 3 1 3 3   Tired, decreased energy 0 3 1 2 3   Change in appetite 0 3 1 3 3   Feeling bad or failure about yourself  0 0 1 3 3   Trouble concentrating 0 0 0 1 3  Moving slowly or fidgety/restless 0 0 1 1 3   Suicidal thoughts 0 0 0 0 0  PHQ-9 Score 0 15 7 17 24   Difficult doing work/chores Not difficult at all Somewhat difficult Somewhat difficult Very difficult Extremely dIfficult    The patient does not have a history of falls. I did complete a risk assessment for falls. A plan of care for falls was  documented.   Past Medical History:  Past Medical History:  Diagnosis Date   Anxiety    Arthritis    Gout    Hypertension     Surgical History:  Past Surgical History:  Procedure Laterality Date   CYST REMOVAL TRUNK     Groin area around 2000   EMBOLIZATION (CATH LAB) N/A 04/12/2020   Procedure: EMBOLIZATION, spleen;  Surgeon: Celso College, MD;  Location: [facility];  Service: Cardiovascular;  Laterality: N/A;   INCISION AND DRAINAGE ABSCESS N/A 07/26/2019   Procedure: INCISION AND DRAINAGE ABSCESS;  Surgeon: Dustin Gimenez, MD;  Location: [facility];  Service: Urology;  Laterality: N/A;   INCISION AND DRAINAGE ABSCESS N/A 07/26/2019   Procedure: INCISION AND DRAINAGE ABSCESS;  Surgeon: Conrado Delay, DO;  Location: [facility];  Service: General;  Laterality: N/A;   LAPAROTOMY N/A 04/13/2020   Procedure: EXPLORATORY LAPAROTOMY;  Surgeon: Alben Alma, MD;  Location: [facility];  Service: General;  Laterality: N/A;   SPLENECTOMY, TOTAL      Medications:  Current Outpatient Medications on File Prior to Visit  Medication Sig   bimekizumab-bkzx (BIMZELX) 320 MG/2ML pen Inject 2 mLs (320 mg total) into the skin every  28 (twenty-eight) days. For maintenance dosing   bimekizumab-bkzx (BIMZELX) 320 MG/2ML pen Inject 2 mLs (320 mg total) into the skin every 14 (fourteen) days. Sq injection q 2 weeks for 9 doses for loading dose   celecoxib  (CELEBREX ) 100 MG capsule Take 1 capsule (100 mg total) by mouth daily.   folic acid  (FOLVITE ) 1 MG tablet Take 1 tablet (1 mg total) by mouth daily.   gabapentin  (NEURONTIN ) 400 MG capsule Take 1 capsule (400 mg total) by mouth 3 (three) times daily.   isotretinoin  (ACCUTANE ) 10 MG capsule Take 1 capsule (10 mg total) by mouth daily.   mometasone  (ELOCON ) 0.1 % cream APPLY 1 APPLICATION TOPICALLY DAILY AS NEEDED (RASH).   mometasone  (ELOCON ) 0.1 % cream Apply 1 Application topically daily as needed (Rash). qd up to 5d/wk aa itchy rash buttocks prn  flares   No current facility-administered medications on file prior to visit.    Allergies:  Allergies  Allergen Reactions   Ibuprofen Other (See Comments)    Stomach upset    Social History:  Social History   Socioeconomic History   Marital status: Single    Spouse name: Not on file   Number of children: Not on file   Years of education: Not on file   Highest education level: Not on file  Occupational History   Occupation: unemployed  Tobacco Use   Smoking status: Every Day    Current packs/day: 0.50    Average packs/day: 0.5 packs/day for 30.0 years (15.0 ttl pk-yrs)    Types: Cigarettes   Smokeless tobacco: Never   Tobacco comments:    1/2- 1 pack per day  Vaping Use   Vaping status: Never Used  Substance and Sexual Activity   Alcohol use: Yes    Comment: 32 oz a few days per week   Drug use: Yes    Types: Cocaine, Marijuana    Comment: patient doesn't state how many times he uses in a week or when he last used   Sexual activity: Not Currently  Other Topics Concern   Not on file  Social History Narrative   Not on file   Social Drivers of Health   Financial Resource Strain: Unknown (03/14/2020)   Overall Financial Resource Strain (CARDIA)    Difficulty of Paying Living Expenses: Patient declined  Food Insecurity: No Food Insecurity (10/30/2020)   Hunger Vital Sign    Worried About Running Out of Food in the Last Year: Never true    Ran Out of Food in the Last Year: Never true  Transportation Needs: No Transportation Needs (10/30/2020)   PRAPARE - Administrator, Civil Service (Medical): No    Lack of Transportation (Non-Medical): No  Physical Activity: Not on file  Stress: Stress Concern Present (03/14/2020)   [employer] of Occupational Health - Occupational Stress Questionnaire    Feeling of Stress : Very much  Social Connections: Not on file  Intimate Partner Violence: Not on file   Social History   Tobacco Use  Smoking Status  Every Day   Current packs/day: 0.50   Average packs/day: 0.5 packs/day for 30.0 years (15.0 ttl pk-yrs)   Types: Cigarettes  Smokeless Tobacco Never  Tobacco Comments   1/2- 1 pack per day   Social History   Substance and Sexual Activity  Alcohol Use Yes   Comment: 32 oz a few days per week    Family History:  Family History  Problem Relation Age of Onset  Arthritis Mother    Diabetes Father    Hypertension Father    Congestive Heart Failure Father    Hypertension Sister    CVA Sister    Bipolar disorder Brother    Schizophrenia Brother    Cancer Brother     Past medical history, surgical history, medications, allergies, family history and social history reviewed with patient today and changes made to appropriate areas of the chart.   Review of Systems  Eyes:  Negative for blurred vision and double vision.  Respiratory:  Negative for shortness of breath.   Cardiovascular:  Negative for chest pain, palpitations and leg swelling.  Musculoskeletal:        Right ankle swelling and pain  Neurological:  Negative for dizziness and headaches.   All other ROS negative except what is listed above and in the HPI.      Objective:    BP 128/78   Pulse 61   Temp 98 F (36.7 C) (Oral)   Wt 158 lb 12.8 oz (72 kg)   SpO2 99%   BMI 22.15 kg/m   Wt Readings from Last 3 Encounters:  10/11/23 158 lb 12.8 oz (72 kg)  09/21/23 166 lb (75.3 kg)  08/11/23 166 lb (75.3 kg)    Physical Exam Vitals and nursing note reviewed.  Constitutional:      General: He is not in acute distress.    Appearance: Normal appearance. He is not ill-appearing, toxic-appearing or diaphoretic.  HENT:     Head: Normocephalic.     Right Ear: Tympanic membrane, ear canal and external ear normal.     Left Ear: Tympanic membrane, ear canal and external ear normal.     Nose: Nose normal. No congestion or rhinorrhea.     Mouth/Throat:     Mouth: Mucous membranes are moist.  Eyes:     General:         Right eye: No discharge.        Left eye: No discharge.     Extraocular Movements: Extraocular movements intact.     Conjunctiva/sclera: Conjunctivae normal.     Pupils: Pupils are equal, round, and reactive to light.  Cardiovascular:     Rate and Rhythm: Normal rate and regular rhythm.     Heart sounds: No murmur heard. Pulmonary:     Effort: Pulmonary effort is normal. No respiratory distress.     Breath sounds: Normal breath sounds. No wheezing, rhonchi or rales.  Abdominal:     General: Abdomen is flat. Bowel sounds are normal. There is no distension.     Palpations: Abdomen is soft.     Tenderness: There is no abdominal tenderness. There is no guarding.  Musculoskeletal:     Cervical back: Normal range of motion and neck supple.     Comments: Right ankle swelling and warmth.  Painful to touch.  Skin:    General: Skin is warm and dry.     Capillary Refill: Capillary refill takes less than 2 seconds.  Neurological:     General: No focal deficit present.     Mental Status: He is alert and oriented to person, place, and time.     Cranial Nerves: No cranial nerve deficit.     Motor: No weakness.     Deep Tendon Reflexes: Reflexes normal.  Psychiatric:        Mood and Affect: Mood normal.        Behavior: Behavior normal.        Thought  Content: Thought content normal.        Judgment: Judgment normal.     Results for orders placed or performed in visit on 04/13/23  Comp Met (CMET)   Collection Time: 04/13/23 11:24 AM  Result Value Ref Range   Glucose 97 70 - 99 mg/dL   BUN 10 8 - 27 mg/dL   Creatinine, Ser 8.29 0.76 - 1.27 mg/dL   eGFR 90 >56 OZ/HYQ/6.57   BUN/Creatinine Ratio 10 10 - 24   Sodium 141 134 - 144 mmol/L   Potassium 4.2 3.5 - 5.2 mmol/L   Chloride 101 96 - 106 mmol/L   CO2 21 20 - 29 mmol/L   Calcium 9.7 8.6 - 10.2 mg/dL   Total Protein 8.3 6.0 - 8.5 g/dL   Albumin 4.2 3.8 - 4.9 g/dL   Globulin, Total 4.1 1.5 - 4.5 g/dL   Bilirubin Total 0.6 0.0 - 1.2  mg/dL   Alkaline Phosphatase 155 (H) 44 - 121 IU/L   AST 27 0 - 40 IU/L   ALT 15 0 - 44 IU/L  CBC w/Diff   Collection Time: 04/13/23 11:24 AM  Result Value Ref Range   WBC 7.0 3.4 - 10.8 x10E3/uL   RBC 5.20 4.14 - 5.80 x10E6/uL   Hemoglobin 16.8 13.0 - 17.7 g/dL   Hematocrit 84.6 96.2 - 51.0 %   MCV 95 79 - 97 fL   MCH 32.3 26.6 - 33.0 pg   MCHC 34.1 31.5 - 35.7 g/dL   RDW 95.2 84.1 - 32.4 %   Platelets 336 150 - 450 x10E3/uL   Neutrophils 60 Not Estab. %   Lymphs 25 Not Estab. %   Monocytes 11 Not Estab. %   Eos 3 Not Estab. %   Basos 1 Not Estab. %   Neutrophils Absolute 4.2 1.4 - 7.0 x10E3/uL   Lymphocytes Absolute 1.8 0.7 - 3.1 x10E3/uL   Monocytes Absolute 0.8 0.1 - 0.9 x10E3/uL   EOS (ABSOLUTE) 0.2 0.0 - 0.4 x10E3/uL   Basophils Absolute 0.0 0.0 - 0.2 x10E3/uL   Immature Granulocytes 0 Not Estab. %   Immature Grans (Abs) 0.0 0.0 - 0.1 x10E3/uL  Cologuard   Collection Time: 05/12/23 11:49 PM  Result Value Ref Range   COLOGUARD Sample Could Not Be Processed 10 N/A      Assessment & Plan:   Problem List Items Addressed This Visit       Cardiovascular and Mediastinum   Essential hypertension   Chronic.  Controlled.  Continue with current medication regimen of Amlodipine  5mg  daily.  Refills sent today.  Labs ordered today.  Return to clinic in 6 months for reevaluation.  Call sooner if concerns arise.       Relevant Medications   amLODipine  (NORVASC ) 5 MG tablet   thiamine  (VITAMIN B1) 100 MG tablet     Musculoskeletal and Integument   Hidradenitis suppurativa   Labs ordered that are requested for Dermatology.  Continue to follow up with specialist.      Relevant Orders   QuantiFERON-TB Gold Plus   Hepatitis C Antibody   Hepatitis B surface antigen   Hepatitis B Surface AntiBODY   HIV Antibody (routine testing w rflx)     Other   ETOH abuse   Has cut back on drinking due to recent gout flares.       Other Visit Diagnoses       Annual physical exam     -  Primary   Health maintenance reviewed during  visit today.  Labs orderedd.  Vaccines reviewed.  Declines colon cancer screening.   Relevant Orders   TSH   PSA   Lipid panel   CBC with Differential/Platelet   Comprehensive metabolic panel with GFR     Screening for ischemic heart disease       Relevant Orders   Lipid panel     Other secondary acute gout of right ankle       Will treat with prednisone .  Follow up if not improved.   Relevant Medications   methylPREDNISolone (MEDROL DOSEPAK) 4 MG TBPK tablet     Encounter for behavioral health screening            Discussed aspirin prophylaxis for myocardial infarction prevention and decision was it was not indicated  LABORATORY TESTING:  Health maintenance labs ordered today as discussed above.   The natural history of prostate cancer and ongoing controversy regarding screening and potential treatment outcomes of prostate cancer has been discussed with the patient. The meaning of a false positive PSA and a false negative PSA has been discussed. He indicates understanding of the limitations of this screening test and wishes to proceed with screening PSA testing.   IMMUNIZATIONS:   - Tdap: Tetanus vaccination status reviewed: last tetanus booster within 10 years. - Influenza: Administered today - Pneumovax: Up to date - Prevnar: Up to date - HPV: Not applicable - Zostavax vaccine:  Discussed at visit today  SCREENING: - Colonoscopy: Sent it back and not able to process it. Discussed with patient purpose of the colonoscopy is to detect colon cancer at curable precancerous or early stages   - AAA Screening: Not applicable  -Hearing Test: Not applicable  -Spirometry: Not applicable   PATIENT COUNSELING:    Sexuality: Discussed sexually transmitted diseases, partner selection, use of condoms, avoidance of unintended pregnancy  and contraceptive alternatives.   Advised to avoid cigarette smoking.  I discussed with the  patient that most people either abstain from alcohol or drink within safe limits (<=14/week and <=4 drinks/occasion for males, <=7/weeks and <= 3 drinks/occasion for females) and that the risk for alcohol disorders and other health effects rises proportionally with the number of drinks per week and how often a drinker exceeds daily limits.  Discussed cessation/primary prevention of drug use and availability of treatment for abuse.   Diet: Encouraged to adjust caloric intake to maintain  or achieve ideal body weight, to reduce intake of dietary saturated fat and total fat, to limit sodium intake by avoiding high sodium foods and not adding table salt, and to maintain adequate dietary potassium and calcium preferably from fresh fruits, vegetables, and low-fat dairy products.    stressed the importance of regular exercise  Injury prevention: Discussed safety belts, safety helmets, smoke detector, smoking near bedding or upholstery.   Dental health: Discussed importance of regular tooth brushing, flossing, and dental visits.   Follow up plan: NEXT PREVENTATIVE PHYSICAL DUE IN 1 YEAR. Return in about 6 months (around 04/12/2024) for HTN, HLD, DM2 FU.

## 2023-10-11 NOTE — Assessment & Plan Note (Signed)
 Has cut back on drinking due to recent gout flares.

## 2023-10-11 NOTE — Assessment & Plan Note (Signed)
 Chronic.  Controlled.  Continue with current medication regimen of Amlodipine 5mg  daily.  Refills sent today. Labs ordered today.  Return to clinic in 6 months for reevaluation.  Call sooner if concerns arise.

## 2023-10-14 ENCOUNTER — Encounter: Payer: Self-pay | Admitting: Nurse Practitioner

## 2023-10-14 LAB — HIV ANTIBODY (ROUTINE TESTING W REFLEX)

## 2023-10-14 LAB — CBC WITH DIFFERENTIAL/PLATELET
Basophils Absolute: 0.1 10*3/uL (ref 0.0–0.2)
Basos: 1 %
EOS (ABSOLUTE): 0.4 10*3/uL (ref 0.0–0.4)
Eos: 5 %
Hematocrit: 48 % (ref 37.5–51.0)
Hemoglobin: 16.2 g/dL (ref 13.0–17.7)
Immature Grans (Abs): 0 10*3/uL (ref 0.0–0.1)
Immature Granulocytes: 0 %
Lymphocytes Absolute: 1.6 10*3/uL (ref 0.7–3.1)
Lymphs: 22 %
MCH: 31.3 pg (ref 26.6–33.0)
MCHC: 33.8 g/dL (ref 31.5–35.7)
MCV: 93 fL (ref 79–97)
Monocytes Absolute: 0.7 10*3/uL (ref 0.1–0.9)
Monocytes: 10 %
Neutrophils Absolute: 4.7 10*3/uL (ref 1.4–7.0)
Neutrophils: 62 %
Platelets: 422 10*3/uL (ref 150–450)
RBC: 5.18 x10E6/uL (ref 4.14–5.80)
RDW: 13.1 % (ref 11.6–15.4)
WBC: 7.5 10*3/uL (ref 3.4–10.8)

## 2023-10-14 LAB — COMPREHENSIVE METABOLIC PANEL WITH GFR
ALT: 9 IU/L (ref 0–44)
AST: 17 IU/L (ref 0–40)
Albumin: 4 g/dL (ref 3.9–4.9)
Alkaline Phosphatase: 151 IU/L — ABNORMAL HIGH (ref 44–121)
BUN/Creatinine Ratio: 9 — ABNORMAL LOW (ref 10–24)
BUN: 8 mg/dL (ref 8–27)
Bilirubin Total: 0.3 mg/dL (ref 0.0–1.2)
CO2: 20 mmol/L (ref 20–29)
Calcium: 9.3 mg/dL (ref 8.6–10.2)
Chloride: 100 mmol/L (ref 96–106)
Creatinine, Ser: 0.93 mg/dL (ref 0.76–1.27)
Globulin, Total: 4.3 g/dL (ref 1.5–4.5)
Glucose: 62 mg/dL — ABNORMAL LOW (ref 70–99)
Potassium: 5.1 mmol/L (ref 3.5–5.2)
Sodium: 141 mmol/L (ref 134–144)
Total Protein: 8.3 g/dL (ref 6.0–8.5)
eGFR: 93 mL/min/{1.73_m2} (ref >59)

## 2023-10-14 LAB — QUANTIFERON-TB GOLD PLUS
QuantiFERON Mitogen Value: 0.04 [IU]/mL
QuantiFERON Nil Value: 0.04 [IU]/mL
QuantiFERON TB1 Ag Value: 0.04 [IU]/mL
QuantiFERON TB2 Ag Value: 0.05 [IU]/mL

## 2023-10-14 LAB — LIPID PANEL
Chol/HDL Ratio: 2.6 ratio (ref 0.0–5.0)
Cholesterol, Total: 145 mg/dL (ref 100–199)
HDL: 55 mg/dL (ref >39)
LDL Chol Calc (NIH): 78 mg/dL (ref 0–99)
Triglycerides: 60 mg/dL (ref 0–149)
VLDL Cholesterol Cal: 12 mg/dL (ref 5–40)

## 2023-10-14 LAB — PSA: Prostate Specific Ag, Serum: 1.7 ng/mL (ref 0.0–4.0)

## 2023-10-14 LAB — HEPATITIS B SURFACE ANTIGEN: Hepatitis B Surface Ag: NEGATIVE

## 2023-10-14 LAB — HEPATITIS B SURFACE ANTIBODY,QUALITATIVE: Hep B Surface Ab, Qual: NONREACTIVE

## 2023-10-14 LAB — HEPATITIS C ANTIBODY: Hep C Virus Ab: NONREACTIVE

## 2023-10-14 LAB — TSH: TSH: 2.44 u[IU]/mL (ref 0.450–4.500)

## 2023-10-14 NOTE — Addendum Note (Signed)
 Addended by: Aileen Alexanders on: 10/14/2023 08:13 AM   Modules accepted: Orders

## 2023-10-23 ENCOUNTER — Other Ambulatory Visit: Payer: Self-pay | Admitting: Dermatology

## 2023-10-23 DIAGNOSIS — L732 Hidradenitis suppurativa: Secondary | ICD-10-CM

## 2023-10-25 ENCOUNTER — Encounter: Payer: Self-pay | Admitting: Dermatology

## 2023-10-25 ENCOUNTER — Ambulatory Visit (INDEPENDENT_AMBULATORY_CARE_PROVIDER_SITE_OTHER): Admitting: Dermatology

## 2023-10-25 DIAGNOSIS — L732 Hidradenitis suppurativa: Secondary | ICD-10-CM

## 2023-10-25 DIAGNOSIS — Z79899 Other long term (current) drug therapy: Secondary | ICD-10-CM | POA: Diagnosis not present

## 2023-10-25 DIAGNOSIS — Z7189 Other specified counseling: Secondary | ICD-10-CM

## 2023-10-25 MED ORDER — HUMIRA-CD/UC/HS STARTER 80 MG/0.8ML ~~LOC~~ AJKT: 160.0000 mg | AUTO-INJECTOR | SUBCUTANEOUS | 0 refills | Status: DC

## 2023-10-25 MED ORDER — ADALIMUMAB 80 MG/0.8ML ~~LOC~~ AJKT: 80.0000 mg | AUTO-INJECTOR | SUBCUTANEOUS | 5 refills | Status: DC

## 2023-10-25 NOTE — Progress Notes (Signed)
   Follow Up Visit   Subjective  Caleb Brooks is a 63 y.o. male who presents for the following: hidradenitis suppurative. Did not finish Isotretinoin  after last visit. Depression symptoms are bad and still the same.    The following portions of the chart were reviewed this encounter and updated as appropriate: medications, allergies, medical history  Review of Systems:  No other skin or systemic complaints except as noted in HPI or Assessment and Plan.  Objective  Well appearing patient in no apparent distress; mood and affect are within normal limits.  A focused examination was performed of the following areas:  face  Relevant exam findings are noted in the Assessment and Plan.   Assessment & Plan    HIDRADENITIS SUPPURATIVA Severe and flared S/P course of Isotretinoin  Exam: see previous exam  Chronic and persistent condition with duration or expected duration over one year. Condition is symptomatic/ bothersome to patient. Not currently at goal.  Hidradenitis Suppurativa is a chronic; persistent; non-curable, but treatable condition due to abnormal inflamed sweat glands in the body folds (axilla, inframammary, groin, medial thighs), causing recurrent painful draining cysts and scarring. It can be associated with severe scarring acne and cysts; also abscesses and scarring of scalp. The goal is control and prevention of flares, as it is not curable. Scars are permanent and can be thickened. Treatment may include daily use of topical medication and oral antibiotics.  Oral isotretinoin  may also be helpful.  For some cases, Humira or Cosentyx (biologic injections) may be prescribed to decrease the inflammatory process and prevent flares.  When indicated, inflamed cysts may also be treated surgically.  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  Per Caleb Brooks okay to start Humira.  Caleb Brooks is scheduled to go to her office tomorrow for repeat TB test.   Treatment Plan: Plan to start Humira pending negative TB test and insurance approval.   Reviewed risks of biologics including immunosuppression, infections, injection site reaction, and failure to improve condition. Goal is control of skin condition, not cure.  Some older biologics such as Humira and Enbrel may slightly increase risk of malignancy and may worsen congestive heart failure.  Taltz and Cosentyx may cause inflammatory bowel disease to flare. The use of biologics requires long term medication management, including periodic office visits and monitoring of blood work.  Long term medication management.  Patient is using long term (months to years) prescription medication  to control their dermatologic condition.  These medications require periodic monitoring to evaluate for efficacy and side effects and may require periodic laboratory monitoring.   Return in about 1 month (around 11/25/2023).  I, Caleb Brooks, CMA, am acting as scribe for Caleb Collard, MD.   Documentation: I have reviewed the above documentation for accuracy and completeness, and I agree with the above.  Caleb Collard, MD

## 2023-10-25 NOTE — Patient Instructions (Addendum)
 Plan to start Humira pending negative TB test and insurance approval.    Reviewed risks of biologics including immunosuppression, infections, injection site reaction, and failure to improve condition. Goal is control of skin condition, not cure.  Some older biologics such as Humira and Enbrel may slightly increase risk of malignancy and may worsen congestive heart failure.  Taltz and Cosentyx may cause inflammatory bowel disease to flare. The use of biologics requires long term medication management, including periodic office visits and monitoring of blood work.    Cont Mometasone  cr qd up to 5d/wk aa buttock prn itch     Topical steroids (such as triamcinolone, fluocinolone, fluocinonide, mometasone , clobetasol, halobetasol, betamethasone, hydrocortisone) can cause thinning and lightening of the skin if they are used for too long in the same area. Your physician has selected the right strength medicine for your problem and area affected on the body. Please use your medication only as directed by your physician to prevent side effects.      Due to recent changes in healthcare laws, you may see results of your pathology and/or laboratory studies on MyChart before the doctors have had a chance to review them. We understand that in some cases there may be results that are confusing or concerning to you. Please understand that not all results are received at the same time and often the doctors may need to interpret multiple results in order to provide you with the best plan of care or course of treatment. Therefore, we ask that you please give us  2 business days to thoroughly review all your results before contacting the office for clarification. Should we see a critical lab result, you will be contacted sooner.   If You Need Anything After Your Visit  If you have any questions or concerns for your doctor, please call our main line at [phone] and press option 4 to reach your doctor's medical  assistant. If no one answers, please leave a voicemail as directed and we will return your call as soon as possible. Messages left after 4 pm will be answered the following business day.   You may also send us  a message via MyChart. We typically respond to MyChart messages within 1-2 business days.  For prescription refills, please ask your pharmacy to contact our office. Our fax number is [phone].  If you have an urgent issue when the clinic is closed that cannot wait until the next business day, you can page your doctor at the number below.    Please note that while we do our best to be available for urgent issues outside of office hours, we are not available 24/7.   If you have an urgent issue and are unable to reach us , you may choose to seek medical care at your doctor's office, retail clinic, urgent care center, or emergency room.  If you have a medical emergency, please immediately call 911 or go to the emergency department.  Pager Numbers  - Dr. Bary Likes: [phone]  - Dr. Annette Barters: [phone]  - Dr. Felipe Horton: [phone]   In the event of inclement weather, please call our main line at [phone] for an update on the status of any delays or closures.  Dermatology Medication Tips: Please keep the boxes that topical medications come in in order to help keep track of the instructions about where and how to use these. Pharmacies typically print the medication instructions only on the boxes and not directly on the medication tubes.   If your medication is too  expensive, please contact our office at [phone] option 4 or send us  a message through MyChart.   We are unable to tell what your co-pay for medications will be in advance as this is different depending on your insurance coverage. However, we may be able to find a substitute medication at lower cost or fill out paperwork to get insurance to cover a needed medication.   If a prior authorization is required to get  your medication covered by your insurance company, please allow us  1-2 business days to complete this process.  Drug prices often vary depending on where the prescription is filled and some pharmacies may offer cheaper prices.  The website www.goodrx.com contains coupons for medications through different pharmacies. The prices here do not account for what the cost may be with help from insurance (it may be cheaper with your insurance), but the website can give you the price if you did not use any insurance.  - You can print the associated coupon and take it with your prescription to the pharmacy.  - You may also stop by our office during regular business hours and pick up a GoodRx coupon card.  - If you need your prescription sent electronically to a different pharmacy, notify our office through [facility] or by phone at [phone] option 4.     Si Usted Necesita Algo Despus de Su Visita  Tambin puede enviarnos un mensaje a travs de Clinical cytogeneticist. Por lo general respondemos a los mensajes de MyChart en el transcurso de 1 a 2 das hbiles.  Para renovar recetas, por favor pida a su farmacia que se ponga en contacto con nuestra oficina. Franz Jacks de fax es [place] [phone].  Si tiene un asunto urgente cuando la clnica est cerrada y que no puede esperar hasta el siguiente da hbil, puede llamar/localizar a su doctor(a) al nmero que aparece a continuacin.   Por favor, tenga en cuenta que aunque hacemos todo lo posible para estar disponibles para asuntos urgentes fuera del horario de [place], no estamos disponibles las 24 horas del da, los 7 [address]  [address] de la [place].   Si tiene un problema urgente y no puede comunicarse con nosotros, puede optar por buscar atencin mdica  en el consultorio de su doctor(a), en una clnica privada, en un centro de atencin urgente o en una sala de emergencias.  Si tiene Engineer, drilling, por favor llame inmediatamente al 911 o vaya a la sala de  emergencias.  Nmeros de bper  - Dr. Bary Likes: [phone]  - Dra. Annette Barters: [phone]  - Dr. Felipe Horton: [phone]   En caso de inclemencias del tiempo, por favor llame a Lajuan Pila principal al [phone] para una actualizacin sobre el [place] de cualquier retraso o cierre.  Consejos para la medicacin en dermatologa: Por favor, guarde las cajas en las que vienen los medicamentos de uso tpico para ayudarle a seguir las instrucciones sobre dnde y cmo usarlos. Las farmacias generalmente imprimen las instrucciones del medicamento slo en las cajas y no directamente en los tubos del [place].   Si su medicamento es muy caro, por favor, pngase en contacto con Bettyjane Brunet llamando al [phone] y presione la opcin 4 o envenos un mensaje a travs de Clinical cytogeneticist.   No podemos decirle cul ser su copago por los medicamentos por adelantado ya que esto es diferente dependiendo de la cobertura de su seguro. Sin embargo, es posible que podamos encontrar un medicamento sustituto a Audiological scientist un formulario para que el  seguro cubra el medicamento que se considera necesario.   Si se requiere una autorizacin previa para que su compaa de seguros [place] su medicamento, por favor permtanos de 1 a 2 das hbiles para completar este proceso.  Los precios de los medicamentos varan con frecuencia dependiendo del Environmental consultant de dnde se surte la receta y alguna farmacias pueden ofrecer precios ms baratos.  El sitio web www.goodrx.com tiene cupones para medicamentos de Health and safety inspector. Los precios aqu no tienen en cuenta lo que podra costar con la ayuda del seguro (puede ser ms barato con su seguro), pero el sitio web puede darle el precio si no utiliz Tourist information centre manager.  - Puede imprimir el cupn correspondiente y llevarlo con su receta a la farmacia.  - Tambin puede pasar por nuestra oficina durante el horario de atencin regular y Education officer, museum una tarjeta de cupones de GoodRx.  - Si  necesita que su receta se enve electrnicamente a una farmacia diferente, informe a nuestra oficina a travs de MyChart de [place] o por telfono llamando al [phone] y presione la opcin 4.

## 2023-10-26 ENCOUNTER — Encounter: Payer: Self-pay | Admitting: Dermatology

## 2023-10-26 ENCOUNTER — Other Ambulatory Visit

## 2023-10-26 DIAGNOSIS — L732 Hidradenitis suppurativa: Secondary | ICD-10-CM

## 2023-10-27 ENCOUNTER — Telehealth: Payer: Self-pay | Admitting: Nurse Practitioner

## 2023-10-27 DIAGNOSIS — L732 Hidradenitis suppurativa: Secondary | ICD-10-CM

## 2023-10-27 NOTE — Telephone Encounter (Signed)
 Prescription Request  10/27/2023  LOV: 10/11/2023  What is the name of the medication or equipment?  gabapentin  (NEURONTIN ) 400 MG capsule   celecoxib  (CELEBREX ) 100 MG capsule   bimekizumab-bkzx (BIMZELX) 320 MG/2ML pen     Have you contacted your pharmacy to request a refill? No   Which pharmacy would you like this sent to?   [id number] - [place], [place] - [address]   Patient notified that their request is being sent to the clinical staff for review and that they should receive a response within 2 business days.   Please advise at Mobile [phone] (mobile)

## 2023-10-30 LAB — QUANTIFERON-TB GOLD PLUS
QuantiFERON Mitogen Value: >10 [IU]/mL
QuantiFERON Nil Value: 0.05 [IU]/mL
QuantiFERON TB1 Ag Value: 0.05 [IU]/mL
QuantiFERON TB2 Ag Value: 0.04 [IU]/mL
QuantiFERON-TB Gold Plus: NEGATIVE

## 2023-11-02 ENCOUNTER — Ambulatory Visit: Payer: Self-pay | Admitting: Nurse Practitioner

## 2023-11-18 ENCOUNTER — Other Ambulatory Visit: Payer: Self-pay

## 2023-11-18 MED ORDER — HUMIRA-CD/UC/HS STARTER 80 MG/0.8ML ~~LOC~~ AJKT: 160.0000 mg | AUTO-INJECTOR | SUBCUTANEOUS | 0 refills | Status: DC

## 2023-11-18 MED ORDER — ADALIMUMAB 80 MG/0.8ML ~~LOC~~ AJKT: 80.0000 mg | AUTO-INJECTOR | SUBCUTANEOUS | 5 refills | Status: AC

## 2023-11-18 NOTE — Progress Notes (Signed)
 Escripted to [facility] per insurance benefits.

## 2023-11-23 ENCOUNTER — Telehealth: Payer: Self-pay

## 2023-11-23 ENCOUNTER — Ambulatory Visit: Admitting: Dermatology

## 2023-11-23 NOTE — Telephone Encounter (Signed)
 Called speciality pharmacy for status of Humira . RX is still in processing. If the medication is delivered next week while I am out of the office, please call patient and get him back on Dr. Lan Pin schedule.  Appt cancelled today due to no medication. aw

## 2023-11-24 NOTE — Telephone Encounter (Signed)
 Pharmacy has been trying to get in touch with patient to schedule delivery for Humira . Patient has been called and advised. He was not at a place to take the pharmacy information. He is going to check his message and call the office back. aw

## 2023-11-25 ENCOUNTER — Other Ambulatory Visit: Payer: Self-pay | Admitting: Dermatology

## 2023-11-25 DIAGNOSIS — L732 Hidradenitis suppurativa: Secondary | ICD-10-CM

## 2023-11-29 NOTE — Telephone Encounter (Signed)
 Humira  scheduled to arrive on 12/14/23, appointment scheduled, no answer, and no voicemail, unable to leave message for patient.

## 2023-12-16 ENCOUNTER — Ambulatory Visit: Admitting: Dermatology

## 2023-12-16 NOTE — Telephone Encounter (Signed)
 Patient no showed appointment. Humira  was sat out in anticipation of appointment, and needs to be used within 2 weeks. Contact patient to schedule appointment but no answer and no voicemail.

## 2023-12-28 ENCOUNTER — Ambulatory Visit (INDEPENDENT_AMBULATORY_CARE_PROVIDER_SITE_OTHER): Admitting: Dermatology

## 2023-12-28 ENCOUNTER — Encounter: Payer: Self-pay | Admitting: Dermatology

## 2023-12-28 DIAGNOSIS — Z79899 Other long term (current) drug therapy: Secondary | ICD-10-CM | POA: Diagnosis not present

## 2023-12-28 DIAGNOSIS — Z7189 Other specified counseling: Secondary | ICD-10-CM | POA: Diagnosis not present

## 2023-12-28 DIAGNOSIS — L732 Hidradenitis suppurativa: Secondary | ICD-10-CM | POA: Diagnosis not present

## 2023-12-28 MED ORDER — ADALIMUMAB 80 MG/0.8ML ~~LOC~~ AJKT
80.0000 mg | AUTO-INJECTOR | SUBCUTANEOUS | Status: AC
  Administered 2023-12-28 – 2024-04-11 (×6): 80 mg via SUBCUTANEOUS

## 2023-12-28 NOTE — Patient Instructions (Signed)

## 2023-12-28 NOTE — Progress Notes (Unsigned)
   Follow-Up Visit   Subjective  Caleb Brooks is a 63 y.o. male who presents for the following: HS groin, buttocks, pt to start Humira  today  The following portions of the chart were reviewed this encounter and updated as appropriate: medications, allergies, medical history  Review of Systems:  No other skin or systemic complaints except as noted in HPI or Assessment and Plan.  Objective  Well appearing patient in no apparent distress; mood and affect are within normal limits.  A focused examination was performed of the following areas: Trunk face  Relevant exam findings are noted in the Assessment and Plan.    Assessment & Plan   HIDRADENITIS SUPPURATIVA Severe and flared S/P course of Isotretinoin   Groin, buttocks Exam: not examined today  Chronic and persistent condition with duration or expected duration over one year. Condition is bothersome/symptomatic for patient. Currently flared.   Hidradenitis Suppurativa is a chronic; persistent; non-curable, but treatable condition due to abnormal inflamed sweat glands in the body folds (axilla, inframammary, groin, medial thighs), causing recurrent painful draining cysts and scarring. It can be associated with severe scarring acne and cysts; also abscesses and scarring of scalp. The goal is control and prevention of flares, as it is not curable. Scars are permanent and can be thickened. Treatment may include daily use of topical medication and oral antibiotics.  Oral isotretinoin  may also be helpful.  For some cases, Humira  or Cosentyx (biologic injections) may be prescribed to decrease the inflammatory process and prevent flares.  When indicated, inflamed cysts may also be treated surgically.  Treatment Plan: Start Humira  160 mg sq injections on day 1, 80mg  sq injection on day 15, then 80mg  sq injections q 2 wks Humira  loading dose 160mg  (2 80mg ) sq injections today to L and R upper arm Lot [id number] exp 08/2024  Reviewed risks of  biologics including immunosuppression, infections, injection site reaction, and failure to improve condition. Goal is control of skin condition, not cure.  Some older biologics such as Humira  and Enbrel may slightly increase risk of malignancy and may worsen congestive heart failure.  Taltz and Cosentyx may cause inflammatory bowel disease to flare. The use of biologics requires long term medication management, including periodic office visits and monitoring of blood work.  HIDRADENITIS SUPPURATIVA   Related Medications mometasone  (ELOCON ) 0.1 % cream APPLY 1 APPLICATION TOPICALLY DAILY AS NEEDED (RASH). celecoxib  (CELEBREX ) 100 MG capsule TAKE 1 CAPSULE BY MOUTH EVERY DAY adalimumab  (HUMIRA ) pen 80 mg   Return in about 2 weeks (around 01/11/2024) for nurse Humira , 37m Dr. Hester HS f/u.  I, Grayce Saunas, RMA, am acting as scribe for Alm Hester, MD .   Documentation: I have reviewed the above documentation for accuracy and completeness, and I agree with the above.  Alm Hester, MD

## 2023-12-29 ENCOUNTER — Encounter: Payer: Self-pay | Admitting: Dermatology

## 2024-01-04 ENCOUNTER — Other Ambulatory Visit: Payer: Self-pay | Admitting: Dermatology

## 2024-01-04 DIAGNOSIS — L732 Hidradenitis suppurativa: Secondary | ICD-10-CM

## 2024-01-11 ENCOUNTER — Ambulatory Visit

## 2024-01-11 DIAGNOSIS — L732 Hidradenitis suppurativa: Secondary | ICD-10-CM

## 2024-01-11 NOTE — Progress Notes (Signed)
 Patient here today for second loading dose of Humira  for Hidradenitis suppurativa.  Humira  80mg /0.42mL injected into L upper arm. Patient tolerated injection well.   LOT: [id number] EXP: MAR 2026  Alan Pizza, RMA

## 2024-01-14 ENCOUNTER — Other Ambulatory Visit: Payer: Self-pay | Admitting: Dermatology

## 2024-01-14 DIAGNOSIS — L732 Hidradenitis suppurativa: Secondary | ICD-10-CM

## 2024-01-25 ENCOUNTER — Ambulatory Visit

## 2024-02-01 ENCOUNTER — Ambulatory Visit

## 2024-02-01 DIAGNOSIS — L732 Hidradenitis suppurativa: Secondary | ICD-10-CM | POA: Diagnosis not present

## 2024-02-01 NOTE — Progress Notes (Signed)
 Patient here today for second loading dose of Humira  for Hidradenitis suppurativa.   Humira  80mg /0.63mL injected into right upper arm. Patient tolerated injection well.    LOT: [id number] EXP: 03/2025  Alan Pizza, RMA

## 2024-02-06 ENCOUNTER — Other Ambulatory Visit: Payer: Self-pay | Admitting: Dermatology

## 2024-02-06 DIAGNOSIS — L732 Hidradenitis suppurativa: Secondary | ICD-10-CM

## 2024-02-15 ENCOUNTER — Ambulatory Visit (INDEPENDENT_AMBULATORY_CARE_PROVIDER_SITE_OTHER)

## 2024-02-15 DIAGNOSIS — L732 Hidradenitis suppurativa: Secondary | ICD-10-CM | POA: Diagnosis not present

## 2024-02-15 NOTE — Progress Notes (Signed)
 Patient here today for second loading dose of Humira  for Hidradenitis suppurativa.   Humira  80mg /0.84mL injected into left upper arm. Patient tolerated injection well.    LOT: [id number] EXP: 03/2025  Alan Pizza, RMA

## 2024-02-29 ENCOUNTER — Telehealth: Payer: Self-pay

## 2024-02-29 ENCOUNTER — Ambulatory Visit

## 2024-02-29 NOTE — Telephone Encounter (Signed)
 Patient here today for Humira  injection and we do not have any in office for him. Patient states he spoke with pharmacy and they were suppose to ship medication.  Called perform pharmacy today and they have been unable to make contact with patient through the month of August.   Tried calling patient but no answer and no voicemail option on mobile or home phone. aw

## 2024-03-07 ENCOUNTER — Telehealth: Payer: Self-pay

## 2024-03-07 NOTE — Telephone Encounter (Signed)
 Tried to reach patient today by home and cell, unable to leave voicemail. Humira  injection in office now.  Ready to schedule patient. aw

## 2024-03-08 ENCOUNTER — Other Ambulatory Visit: Payer: Self-pay | Admitting: Dermatology

## 2024-03-09 ENCOUNTER — Telehealth: Payer: Self-pay

## 2024-03-09 NOTE — Telephone Encounter (Signed)
 LM on VM please call here to discuss Gabapentin  refill request,  patient will need to contact the prescribing provider for a refill of Gabapentin , we was not the prescribing office

## 2024-03-13 ENCOUNTER — Ambulatory Visit

## 2024-03-13 DIAGNOSIS — L732 Hidradenitis suppurativa: Secondary | ICD-10-CM | POA: Diagnosis not present

## 2024-03-13 NOTE — Progress Notes (Signed)
 Patient here today for second loading dose of Humira  for Hidradenitis suppurativa.   Humira  80mg /0.63mL injected into right upper arm. Patient tolerated injection well.    LOT: [id number] EXP: 03/2025  Alan Pizza, RMA

## 2024-03-27 ENCOUNTER — Ambulatory Visit

## 2024-04-11 ENCOUNTER — Ambulatory Visit (INDEPENDENT_AMBULATORY_CARE_PROVIDER_SITE_OTHER)

## 2024-04-11 DIAGNOSIS — L732 Hidradenitis suppurativa: Secondary | ICD-10-CM | POA: Diagnosis not present

## 2024-04-11 NOTE — Progress Notes (Signed)
 Patient here today for Humira  injection for Hidradenitis suppurativa patient supplied.   Humira  80mg /0.33mL injected into left upper arm. Patient tolerated injection well.    NDC: 9925-9875-97 LOT: [id number] EXP: 03/2025   Nadeem Romanoski V. Wilfred, CMA

## 2024-04-13 ENCOUNTER — Ambulatory Visit: Admitting: Nurse Practitioner

## 2024-04-13 NOTE — Progress Notes (Deleted)
   There were no vitals taken for this visit.   Subjective:    Patient ID: Caleb Brooks, male    DOB: 1960/10/06, 63 y.o.   MRN: [id number]  HPI: Caleb Brooks is a 63 y.o. male  No chief complaint on file.  HYPERTENSION Hypertension status: controlled  Satisfied with current treatment? no Duration of hypertension: years BP monitoring frequency:  not checking BP range:  BP medication side effects:  no Medication compliance: excellent compliance Previous BP meds:amlodipine  Aspirin: no Recurrent headaches: no Visual changes: no Palpitations: no Dyspnea: no Chest pain: no Lower extremity edema: no Dizzy/lightheaded: no  Still seeing Dr. Hester for hidradenitis.  He is currently drinking 20 ounces of beer daily.   Patient is having a gout flare on the right foot and ankle.  .  This has been going on for about two weeks.  He is still having some swelling.  Relevant past medical, surgical, family and social history reviewed and updated as indicated. Interim medical history since our last visit reviewed. Allergies and medications reviewed and updated.  Review of Systems  Per HPI unless specifically indicated above     Objective:    There were no vitals taken for this visit.  Wt Readings from Last 3 Encounters:  10/11/23 158 lb 12.8 oz (72 kg)  09/21/23 166 lb (75.3 kg)  08/11/23 166 lb (75.3 kg)    Physical Exam  Results for orders placed or performed in visit on 10/26/23  QuantiFERON-TB Gold Plus   Collection Time: 10/26/23  4:01 PM  Result Value Ref Range   QuantiFERON Incubation Incubation performed.    QuantiFERON Criteria Comment    QuantiFERON TB1 Ag Value 0.05 IU/mL   QuantiFERON TB2 Ag Value 0.04 IU/mL   QuantiFERON Nil Value 0.05 IU/mL   QuantiFERON Mitogen Value >10.00 IU/mL   QuantiFERON-TB Gold Plus Negative Negative      Assessment & Plan:   Problem List Items Addressed This Visit   None    Follow up plan: No follow-ups on  file.

## 2024-04-18 ENCOUNTER — Ambulatory Visit: Payer: Self-pay

## 2024-04-18 NOTE — Telephone Encounter (Signed)
 FYI Only or Action Required?: FYI only for provider: appointment scheduled on 04/19/2024 at 2:40 PM.  Patient was last seen in primary care on 10/11/2023 by Melvin Pao, NP.  Called Nurse Triage reporting Abdominal Pain.  Symptoms began a week ago.  Interventions attempted: Rest, hydration, or home remedies.  Symptoms are: unchanged.  Triage Disposition: See Physician Within 24 Hours  Patient/caregiver understands and will follow disposition?: Yes  Copied from CRM [id number]. Topic: Clinical - Red Word Triage >> Apr 18, 2024  2:34 PM Roselie BROCKS wrote: Red Word that prompted transfer to Nurse Triage: Patient states he  has pretty bad pain on right side under his ribs Reason for Disposition  [1] MODERATE pain (e.g., interferes with normal activities) AND [2] comes and goes (cramps) AND [3] present > 24 hours  (Exception: Pain with Vomiting or Diarrhea - see that Guideline.)  Answer Assessment - Initial Assessment Questions Right upper abdominal pain that has been going on for a week. Pain comes and goes. Patient does think stress might be a factor in his pain. Scheduled to see PCP tomorrow.   1. LOCATION: Where does it hurt?      Right upper abdominal pain 2. RADIATION: Does the pain shoot anywhere else? (e.g., chest, back)     No radiation 3. ONSET: When did the pain begin? (e.g., minutes, hours or days ago)      Started a week ago 4. SUDDEN: Gradual or sudden onset?     gradual 5. PATTERN Does the pain come and go, or is it constant?     Comes and goes 6. SEVERITY: How bad is the pain?  (e.g., Scale 1-10; mild, moderate, or severe)     4 out of 10 7. RECURRENT SYMPTOM: Have you ever had this type of stomach pain before? If Yes, ask: When was the last time? and What happened that time?      no 8. AGGRAVATING FACTORS: Does anything seem to cause this pain? (e.g., foods, stress, alcohol)     stress 9. CARDIAC SYMPTOMS: Do you have any of the following  symptoms: chest pain, difficulty breathing, sweating, nausea?     no 10. OTHER SYMPTOMS: Do you have any other symptoms? (e.g., back pain, diarrhea, fever, urination pain, vomiting)       no  Protocols used: Abdominal Pain - Upper-A-AH

## 2024-04-19 ENCOUNTER — Encounter: Payer: Self-pay | Admitting: Nurse Practitioner

## 2024-04-19 ENCOUNTER — Ambulatory Visit: Admitting: Nurse Practitioner

## 2024-04-19 VITALS — BP 112/75 | HR 71 | Temp 97.7°F | Wt 166.0 lb

## 2024-04-19 DIAGNOSIS — I10 Essential (primary) hypertension: Secondary | ICD-10-CM | POA: Diagnosis not present

## 2024-04-19 DIAGNOSIS — L732 Hidradenitis suppurativa: Secondary | ICD-10-CM | POA: Diagnosis not present

## 2024-04-19 DIAGNOSIS — Z23 Encounter for immunization: Secondary | ICD-10-CM

## 2024-04-19 DIAGNOSIS — F1010 Alcohol abuse, uncomplicated: Secondary | ICD-10-CM | POA: Diagnosis not present

## 2024-04-19 DIAGNOSIS — M545 Low back pain, unspecified: Secondary | ICD-10-CM

## 2024-04-19 MED ORDER — GABAPENTIN 400 MG PO CAPS: 400.0000 mg | ORAL_CAPSULE | Freq: Three times a day (TID) | ORAL | 5 refills | Status: AC

## 2024-04-19 MED ORDER — FOLIC ACID 1 MG PO TABS: 1.0000 mg | ORAL_TABLET | Freq: Every day | ORAL | 1 refills | Status: AC

## 2024-04-19 MED ORDER — THIAMINE HCL 100 MG PO TABS: 100.0000 mg | ORAL_TABLET | Freq: Every day | ORAL | 1 refills | Status: AC

## 2024-04-19 MED ORDER — AMLODIPINE BESYLATE 5 MG PO TABS: 5.0000 mg | ORAL_TABLET | Freq: Every day | ORAL | 1 refills | Status: AC

## 2024-04-19 MED ORDER — CELECOXIB 100 MG PO CAPS
100.0000 mg | ORAL_CAPSULE | Freq: Every day | ORAL | 3 refills | Status: AC
Start: 2024-04-19 — End: ?
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
## 2024-04-19 NOTE — Assessment & Plan Note (Signed)
 Has cut back on drinking due to recent gout flares.

## 2024-04-19 NOTE — Assessment & Plan Note (Signed)
 Chronic.  Controlled.  Continue with current medication regimen of Humira . Followed by Dr. Hester.  Does have pain that comes back towards the end of his injection wears off.  Uses Celebrex  PRN.  Labs ordered today.  Return to clinic in 6 months for reevaluation.  Call sooner if concerns arise.

## 2024-04-19 NOTE — Assessment & Plan Note (Signed)
 Chronic.  Controlled.  Continue with current medication regimen of Amlodipine 5mg  daily.  Refills sent today. Labs ordered today.  Return to clinic in 6 months for reevaluation.  Call sooner if concerns arise.

## 2024-04-20 ENCOUNTER — Ambulatory Visit: Payer: Self-pay | Admitting: Nurse Practitioner

## 2024-04-20 LAB — CBC WITH DIFFERENTIAL/PLATELET
Basophils Absolute: 0 x10E3/uL (ref 0.0–0.2)
Basos: 1 %
EOS (ABSOLUTE): 0.2 x10E3/uL (ref 0.0–0.4)
Eos: 3 %
Hematocrit: 54.1 % — ABNORMAL HIGH (ref 37.5–51.0)
Hemoglobin: 18.3 g/dL — ABNORMAL HIGH (ref 13.0–17.7)
Immature Grans (Abs): 0 x10E3/uL (ref 0.0–0.1)
Immature Granulocytes: 0 %
Lymphocytes Absolute: 2 x10E3/uL (ref 0.7–3.1)
Lymphs: 42 %
MCH: 32.3 pg (ref 26.6–33.0)
MCHC: 33.8 g/dL (ref 31.5–35.7)
MCV: 95 fL (ref 79–97)
Monocytes Absolute: 0.7 x10E3/uL (ref 0.1–0.9)
Monocytes: 15 %
Neutrophils Absolute: 1.9 x10E3/uL (ref 1.4–7.0)
Neutrophils: 39 %
Platelets: 349 x10E3/uL (ref 150–450)
RBC: 5.67 x10E6/uL (ref 4.14–5.80)
RDW: 12.8 % (ref 11.6–15.4)
WBC: 4.8 x10E3/uL (ref 3.4–10.8)

## 2024-04-20 LAB — COMPREHENSIVE METABOLIC PANEL WITH GFR
ALT: 16 IU/L (ref 0–44)
AST: 22 IU/L (ref 0–40)
Albumin: 3.9 g/dL (ref 3.9–4.9)
Alkaline Phosphatase: 140 IU/L — ABNORMAL HIGH (ref 47–123)
BUN/Creatinine Ratio: 11 (ref 10–24)
BUN: 11 mg/dL (ref 8–27)
Bilirubin Total: 0.3 mg/dL (ref 0.0–1.2)
CO2: 21 mmol/L (ref 20–29)
Calcium: 9.5 mg/dL (ref 8.6–10.2)
Chloride: 99 mmol/L (ref 96–106)
Creatinine, Ser: 1.04 mg/dL (ref 0.76–1.27)
Globulin, Total: 4.2 g/dL (ref 1.5–4.5)
Glucose: 85 mg/dL (ref 70–99)
Potassium: 4.4 mmol/L (ref 3.5–5.2)
Sodium: 137 mmol/L (ref 134–144)
Total Protein: 8.1 g/dL (ref 6.0–8.5)
eGFR: 82 mL/min/1.73 (ref >59)

## 2024-04-21 ENCOUNTER — Telehealth: Payer: Self-pay

## 2024-04-21 NOTE — Telephone Encounter (Signed)
 Copied from CRM #[id number]. Topic: Clinical - Medication Question >> Apr 21, 2024  4:05 PM Avram MATSU wrote: Reason for CRM: patient stated his provider sent in medications that cost $27.00 and he would like to bring that down. He is not sure what the name of the medications is, please advise [phone] (M)

## 2024-04-21 NOTE — Telephone Encounter (Signed)
 Patient has been called and a message left for them to return the call to the office. Ok for E2C2 to review if/when they return the call.   Please ask patient to speak with his pharmacy and ask more specify about which medication in particular is costing so much. He has medicaid so co-pays should only be $4 each. If we can get more details we might be able to assist further.

## 2024-04-25 ENCOUNTER — Ambulatory Visit (INDEPENDENT_AMBULATORY_CARE_PROVIDER_SITE_OTHER): Admitting: Dermatology

## 2024-04-25 DIAGNOSIS — L732 Hidradenitis suppurativa: Secondary | ICD-10-CM

## 2024-04-25 DIAGNOSIS — Z79899 Other long term (current) drug therapy: Secondary | ICD-10-CM | POA: Diagnosis not present

## 2024-04-25 DIAGNOSIS — Z7189 Other specified counseling: Secondary | ICD-10-CM

## 2024-04-25 NOTE — Progress Notes (Unsigned)
   Follow-Up Visit   Subjective  Caleb Brooks is a 63 y.o. male who presents for the following: 6 month HS followup and injection today  Patient reports he is doing well on Humira  injections  The following portions of the chart were reviewed this encounter and updated as appropriate: medications, allergies, medical history  Review of Systems:  No other skin or systemic complaints except as noted in HPI or Assessment and Plan.  Objective  Well appearing patient in no apparent distress; mood and affect are within normal limits.  A focused examination was performed of the following areas: Trunk, face, hand  Relevant exam findings are noted in the Assessment and Plan.    Assessment & Plan   HIDRADENITIS SUPPURATIVA Started Humira  12/28/2023 Improved on Humira  Not as many active areas, few flares are drying up and less angry S/P course of Isotretinoin   Groin, buttocks Exam: not examined today   Chronic and persistent condition with duration or expected duration over one year. Condition is bothersome/symptomatic for patient. Currently flared. Hidradenitis Suppurativa is a chronic; persistent; non-curable, but treatable condition due to abnormal inflamed sweat glands in the body folds (axilla, inframammary, groin, medial thighs), causing recurrent painful draining cysts and scarring. It can be associated with severe scarring acne and cysts; also abscesses and scarring of scalp. The goal is control and prevention of flares, as it is not curable. Scars are permanent and can be thickened. Treatment may include daily use of topical medication and oral antibiotics.  Oral isotretinoin  may also be helpful.  For some cases, Humira  or Cosentyx (biologic injections) may be prescribed to decrease the inflammatory process and prevent flares.  When indicated, inflamed cysts may also be treated surgically.   Treatment Plan: Continue Humira  160 mg sq injections every 2 weeks  Unable to give injection  today due because patient did not respond to pharmacy phone calls. Patient prescription window to receive medication expired.  Alan spoke Caleb Brooks and then with patient in office today and stressed the importance of responding to pharmacy phone calls to confirm shipments of medication  Patient given copy contact information for [facility]  Patient was able to speak with an insurance rep on phone in the office today. Patient will call once he has received shipment and come in for a nurse visit for injection  Will continue treatment unless not responding to treatment will consider other injections.    Reviewed risks of biologics including immunosuppression, infections, injection site reaction, and failure to improve condition. Goal is control of skin condition, not cure.  Some older biologics such as Humira  and Enbrel may slightly increase risk of malignancy and may worsen congestive heart failure.  Taltz and Cosentyx may cause inflammatory bowel disease to flare. The use of biologics requires long term medication management, including periodic office visits and monitoring of blood work.    Return for ,6 month hs followup.  IEleanor Blush, CMA, am acting as scribe for Alm Rhyme, MD.   Documentation: I have reviewed the above documentation for accuracy and completeness, and I agree with the above.  Alm Rhyme, MD

## 2024-04-25 NOTE — Patient Instructions (Signed)

## 2024-04-26 ENCOUNTER — Encounter: Payer: Self-pay | Admitting: Dermatology

## 2024-05-03 ENCOUNTER — Ambulatory Visit

## 2024-05-08 ENCOUNTER — Telehealth: Payer: Self-pay

## 2024-05-08 NOTE — Telephone Encounter (Unsigned)
 Copied from CRM #[id number]. Topic: Clinical - Prescription Issue >> May 08, 2024  2:33 PM Amber H wrote: Reason for CRM: Patient stated he was paying $4 for his Folic acid  and now they are trying to charge him $27 and wanted to know if his provider could see what else could be done so he did not have to pay that much. I did ask if he reached out to his insurance to see why it went up and he stated no. Patient stated the last time this happened to him, his doctor had a sheet that she was able to reference.   [phone]

## 2024-05-10 ENCOUNTER — Ambulatory Visit

## 2024-05-11 ENCOUNTER — Ambulatory Visit

## 2024-05-11 DIAGNOSIS — L732 Hidradenitis suppurativa: Secondary | ICD-10-CM

## 2024-05-11 MED ORDER — ADALIMUMAB 80 MG/0.8ML ~~LOC~~ AJKT
80.0000 mg | AUTO-INJECTOR | Freq: Once | SUBCUTANEOUS | Status: AC
  Administered 2024-05-11: 80 mg via SUBCUTANEOUS

## 2024-05-11 NOTE — Progress Notes (Cosign Needed Addendum)
 Patient here today for Humira  injection for Hidradenitis suppurativa patient supplied.   Humira  80mg /0.4mL injected into right upper arm. Patient tolerated injection well.    NDC: 9925-9875-88 LOT: [id number] EXP: 04/2025    Rosina Mayans CMA, AAMA

## 2024-05-15 NOTE — Telephone Encounter (Signed)
 Contacted [facility] and discussed the Folic Acid  price with the pharmacist. Pharmacist states that they do not know where the patient was getting the medication from for $4. States until this most recent time, they had not filled this prescription with them since April of 2024. Pharmacy states that the patient picked up the folic acid  and paid the $27 last week.   Is this something that the patient could get OTC? Or does it have to be a  prescription?

## 2024-05-15 NOTE — Telephone Encounter (Signed)
 Yes he can purchase it over the counter if it is cheaper.

## 2024-05-15 NOTE — Telephone Encounter (Signed)
 Tried calling patient, no answer and ni VM set up. Will try to call again.   OK for E2C2 to speak to patient and advise them of the messages below.

## 2024-05-16 NOTE — Telephone Encounter (Signed)
 Patient has been called and a message left for them to return the call to the office. Ok for E2C2 to review if/when they return the call. Please do not transfer to CAL rather send a CRM if needed only.   2nd attempt to reach patient. No answer and unable to leave a message. Will try again one more time on the next business day.

## 2024-05-17 NOTE — Telephone Encounter (Signed)
 Tried calling patient, no answer and no VM set up. Will try to call again. 3rd attempt made to reach patient. No further attempts will be made at this time.    OK for E2C2 to speak to patient and advise them of the messages below.

## 2024-05-24 ENCOUNTER — Ambulatory Visit (INDEPENDENT_AMBULATORY_CARE_PROVIDER_SITE_OTHER)

## 2024-05-24 DIAGNOSIS — L732 Hidradenitis suppurativa: Secondary | ICD-10-CM

## 2024-05-24 MED ORDER — ADALIMUMAB 80 MG/0.8ML ~~LOC~~ AJKT
80.0000 mg | AUTO-INJECTOR | Freq: Once | SUBCUTANEOUS | Status: AC
  Administered 2024-05-24: 13:00:00 80 mg via SUBCUTANEOUS

## 2024-05-24 NOTE — Progress Notes (Signed)
 Patient here today for Humira  injection for Hidradenitis suppurativa patient supplied.   Humira  80mg /0.21mL injected into left upper arm. Patient tolerated injection well.    NDC: 9925-9875-88 LOT: [id number] EXP: 04/2025    Alan Pizza, RMA

## 2024-05-25 ENCOUNTER — Ambulatory Visit

## 2024-06-05 ENCOUNTER — Ambulatory Visit

## 2024-06-07 ENCOUNTER — Other Ambulatory Visit: Payer: Self-pay | Admitting: Nurse Practitioner

## 2024-06-09 NOTE — Telephone Encounter (Signed)
 Requested Prescriptions  Pending Prescriptions Disp Refills   cetirizine  (ZYRTEC ) 10 MG tablet [Pharmacy Med Name: CETIRIZINE  HCL 10 MG TABLET] 90 tablet 1    Sig: TAKE 1 TABLET BY MOUTH EVERY DAY     Ear, Nose, and Throat:  Antihistamines 2 Passed - 06/09/2024  9:10 AM      Passed - Cr in normal range and within 360 days    Creatinine, Ser  Date Value Ref Range Status  04/19/2024 1.04 0.76 - 1.27 mg/dL Final         Passed - Valid encounter within last 12 months    Recent Outpatient Visits           1 month ago Essential hypertension   [facility] Melvin Pao, NP   8 months ago Annual physical exam   [facility] Melvin Pao, NP       Future Appointments             In 4 months Hester Alm BROCKS, MD [facility]

## 2024-06-12 ENCOUNTER — Ambulatory Visit

## 2024-06-12 ENCOUNTER — Ambulatory Visit: Payer: Self-pay

## 2024-06-12 NOTE — Telephone Encounter (Signed)
 Noted

## 2024-06-12 NOTE — Telephone Encounter (Signed)
 FYI Only or Action Required?: Action required by provider: request for appointment.  Patient was last seen in primary care on 04/19/2024 by Melvin Pao, NP.  Called Nurse Triage reporting Mouth Lesions.  Symptoms began today.  Interventions attempted: Nothing.  Symptoms are: gradually worsening.  Triage Disposition: See Physician Within 24 Hours  Patient/caregiver understands and will follow disposition?:    Copied from CRM [id number]. Topic: Clinical - Red Word Triage >> Jun 12, 2024  2:54 PM Caleb Brooks wrote: Red Word that prompted transfer to Nurse Triage abscess on gums, needs antibiotic Reason for Disposition  [1] One pimple or ulcer on the gum AND [2] near a toothache  Answer Assessment - Initial Assessment Questions 1. LOCATION: Where is the mouth sore (ulcer) located?      Rt upper side, next to the wisdom tooth 2. NUMBER: How many sores are there? :     one 3. SIZE: How large is the sore?  (e.g., size of an apple seed, watermelon seed, pencil eraser)     unknown 4. PAIN: Are they painful? If Yes, ask: How bad is it?  (Scale 0-10; or none, mild, moderate, severe)     Moderate pain 5. ONSET: When did you first notice the sore?      today 6. RECURRENT SYMPTOM: Have you had a mouth ulcer before? If Yes, ask: When was the last time? and What happened that time?       7. CAUSE: What do you think is causing the mouth sore?     unknown 8. OTHER SYMPTOMS: Do you have any other symptoms? (e.g., fever, swollen lymph node)     Denies fever, facial swelling  Protocols used: Mouth Ulcers-A-AH Triage Disposition: See Physician Within 24 Hours  Patient/caregiver understands and will follow disposition?:

## 2024-06-12 NOTE — Telephone Encounter (Signed)
 Patient prefers to stay at PCP office. Scheduled next available provider for Friday.

## 2024-06-13 ENCOUNTER — Ambulatory Visit

## 2024-06-15 ENCOUNTER — Ambulatory Visit

## 2024-06-16 ENCOUNTER — Ambulatory Visit (INDEPENDENT_AMBULATORY_CARE_PROVIDER_SITE_OTHER): Admitting: Nurse Practitioner

## 2024-06-16 ENCOUNTER — Encounter: Payer: Self-pay | Admitting: Nurse Practitioner

## 2024-06-16 VITALS — BP 109/77 | HR 69 | Temp 97.9°F | Ht 70.98 in | Wt 170.6 lb

## 2024-06-16 DIAGNOSIS — K1379 Other lesions of oral mucosa: Secondary | ICD-10-CM | POA: Insufficient documentation

## 2024-06-16 MED ORDER — AMOXICILLIN-POT CLAVULANATE 875-125 MG PO TABS: 1.0000 | ORAL_TABLET | Freq: Two times a day (BID) | ORAL | 0 refills | Status: AC

## 2024-06-16 MED ORDER — LIDOCAINE VISCOUS HCL 2 % MT SOLN: 15.0000 mL | OROMUCOSAL | 0 refills | Status: AC | PRN
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
## 2024-06-16 NOTE — Patient Instructions (Signed)
 Healthy Eating, Adult Healthy eating may help you get and keep a healthy body weight, reduce the risk of chronic disease, and live a long and productive life. It is important to follow a healthy eating pattern. Your nutritional and calorie needs should be met mainly by different nutrient-rich foods. What are tips for following this plan? Reading food labels Read labels and choose the following: Reduced or low sodium products. Juices with 100% fruit juice. Foods with low saturated fats (<3 g per serving) and high polyunsaturated and monounsaturated fats. Foods with whole grains, such as whole wheat, cracked wheat, brown rice, and wild rice. Whole grains that are fortified with folic acid . This is recommended for females who are pregnant or who want to become pregnant. Read labels and do not eat or drink the following: Foods or drinks with added sugars. These include foods that contain brown sugar, corn sweetener, corn syrup, dextrose , fructose, glucose, high-fructose corn syrup, honey, invert sugar, lactose, malt syrup, maltose, molasses, raw sugar, sucrose, trehalose, or turbinado sugar. Limit your intake of added sugars to less than 10% of your total daily calories. Do not eat more than the following amounts of added sugar per day: 6 teaspoons (25 g) for females. 9 teaspoons (38 g) for males. Foods that contain processed or refined starches and grains. Refined grain products, such as white flour, degermed cornmeal, white bread, and white rice. Shopping Choose nutrient-rich snacks, such as vegetables, whole fruits, and nuts. Avoid high-calorie and high-sugar snacks, such as potato chips, fruit snacks, and candy. Use oil-based dressings and spreads on foods instead of solid fats such as butter, margarine, sour cream, or cream cheese. Limit pre-made sauces, mixes, and instant products such as flavored rice, instant noodles, and ready-made pasta. Try more plant-protein sources, such as tofu,  tempeh, black beans, edamame, lentils, nuts, and seeds. Explore eating plans such as the Mediterranean diet or vegetarian diet. Try heart-healthy dips made with beans and healthy fats like hummus and guacamole. Vegetables go great with these. Cooking Use oil to saut or stir-fry foods instead of solid fats such as butter, margarine, or lard. Try baking, boiling, grilling, or broiling instead of frying. Remove the fatty part of meats before cooking. Steam vegetables in water  or broth. Meal planning  At meals, imagine dividing your plate into fourths: One-half of your plate is fruits and vegetables. One-fourth of your plate is whole grains. One-fourth of your plate is protein, especially lean meats, poultry, eggs, tofu, beans, or nuts. Include low-fat dairy as part of your daily diet. Lifestyle Choose healthy options in all settings, including home, work, school, restaurants, or stores. Prepare your food safely: Wash your hands after handling raw meats. Where you prepare food, keep surfaces clean by regularly washing with hot, soapy water . Keep raw meats separate from ready-to-eat foods, such as fruits and vegetables. Cook seafood, meat, poultry, and eggs to the recommended temperature. Get a food thermometer. Store foods at safe temperatures. In general: Keep cold foods at 34F (4.4C) or below. Keep hot foods at 134F (60C) or above. Keep your freezer at [facility] (-17.8C) or below. Foods are not safe to eat if they have been between the temperatures of 40-134F (4.4-60C) for more than 2 hours. What foods should I eat? Fruits Aim to eat 1-2 cups of fresh, canned (in natural juice), or frozen fruits each day. One cup of fruit equals 1 small apple, 1 large banana, 8 large strawberries, 1 cup (237 g) canned fruit,  cup (82 g) dried fruit,  or 1 cup (240 mL) 100% juice. Vegetables Aim to eat 2-4 cups of fresh and frozen vegetables each day, including different varieties and colors. One cup  of vegetables equals 1 cup (91 g) broccoli or cauliflower florets, 2 medium carrots, 2 cups (150 g) raw, leafy greens, 1 large tomato, 1 large bell pepper, 1 large sweet potato, or 1 medium white potato. Grains Aim to eat 5-10 ounce-equivalents of whole grains each day. Examples of 1 ounce-equivalent of grains include 1 slice of bread, 1 cup (40 g) ready-to-eat cereal, 3 cups (24 g) popcorn, or  cup (93 g) cooked rice. Meats and other proteins Try to eat 5-7 ounce-equivalents of protein each day. Examples of 1 ounce-equivalent of protein include 1 egg,  oz nuts (12 almonds, 24 pistachios, or 7 walnut halves), 1/4 cup (90 g) cooked beans, 6 tablespoons (90 g) hummus or 1 tablespoon (16 g) peanut butter. A cut of meat or fish that is the size of a deck of cards is about 3-4 ounce-equivalents (85 g). Of the protein you eat each week, try to have at least 8 sounce (227 g) of seafood. This is about 2 servings per week. This includes salmon, trout, herring, sardines, and anchovies. Dairy Aim to eat 3 cup-equivalents of fat-free or low-fat dairy each day. Examples of 1 cup-equivalent of dairy include 1 cup (240 mL) milk, 8 ounces (250 g) yogurt, 1 ounces (44 g) natural cheese, or 1 cup (240 mL) fortified soy milk. Fats and oils Aim for about 5 teaspoons (21 g) of fats and oils per day. Choose monounsaturated fats, such as canola and olive oils, mayonnaise made with olive oil or avocado oil, avocados, peanut butter, and most nuts, or polyunsaturated fats, such as sunflower, corn, and soybean oils, walnuts, pine nuts, sesame seeds, sunflower seeds, and flaxseed. Beverages Aim for 6 eight-ounce glasses of water  per day. Limit coffee to 3-5 eight-ounce cups per day. Limit caffeinated beverages that have added calories, such as soda and energy drinks. If you drink alcohol: Limit how much you have to: 0-1 drink a day if you are male. 0-2 drinks a day if you are male. Know how much alcohol is in your drink.  In the [place] one drink is one 12 oz bottle of beer (355 mL), one 5 oz glass of wine (148 mL), or one 1 oz glass of hard liquor (44 mL). Seasoning and other foods Try not to add too much salt to your food. Try using herbs and spices instead of salt. Try not to add sugar to food. This information is based on [place] nutrition guidelines. To learn more, visit [employer].be. Exact amounts may vary. You may need different amounts. This information is not intended to replace advice given to you by your health care provider. Make sure you discuss any questions you have with your health care provider. Document Revised: 02/23/2022 Document Reviewed: 02/23/2022 Elsevier Patient Education  2024 [employer].

## 2024-06-16 NOTE — Assessment & Plan Note (Addendum)
 Acute, no lesions noted on exam but there is area of swelling to inner cheek upper right side and gumline upper right posterior is red. ?infection. Does take Humira . Will send in Augmentin  BID for 7 days and viscous lidocaine  to help with discomfort. Due to his report of swelling starting with this, to hands/knees/ legs will also obtain labs: CBC, CMP, RPR. Discussed plan of care with patient and educated him on medications ordered. Advised him to return if ongoing or worsening symptoms.

## 2024-06-17 ENCOUNTER — Ambulatory Visit: Payer: Self-pay | Admitting: Nurse Practitioner

## 2024-06-17 LAB — SYPHILIS: RPR W/REFLEX TO RPR TITER AND TREPONEMAL ANTIBODIES, TRADITIONAL SCREENING AND DIAGNOSIS ALGORITHM

## 2024-06-17 NOTE — Progress Notes (Signed)
 Contacted via MyChart  Good morning Caleb Brooks, current labs are overall at baseline for you and blood counts are normal. Waiting on one more lab and when returns will let you know. Any questions? Keep being amazing!!  Thank you for allowing me to participate in your care.  I appreciate you. Kindest regards, Tesla Keeler

## 2024-06-20 LAB — CBC WITH DIFFERENTIAL/PLATELET
Basophils Absolute: 0 x10E3/uL (ref 0.0–0.2)
Basos: 0 %
EOS (ABSOLUTE): 0.1 x10E3/uL (ref 0.0–0.4)
Eos: 2 %
Hematocrit: 50.6 % (ref 37.5–51.0)
Hemoglobin: 16.7 g/dL (ref 13.0–17.7)
Immature Grans (Abs): 0 x10E3/uL (ref 0.0–0.1)
Immature Granulocytes: 0 %
Lymphocytes Absolute: 1.2 x10E3/uL (ref 0.7–3.1)
Lymphs: 23 %
MCH: 31.6 pg (ref 26.6–33.0)
MCHC: 33 g/dL (ref 31.5–35.7)
MCV: 96 fL (ref 79–97)
Monocytes Absolute: 0.4 x10E3/uL (ref 0.1–0.9)
Monocytes: 8 %
Neutrophils Absolute: 3.4 x10E3/uL (ref 1.4–7.0)
Neutrophils: 67 %
Platelets: 334 x10E3/uL (ref 150–450)
RBC: 5.29 x10E6/uL (ref 4.14–5.80)
RDW: 13.3 % (ref 11.6–15.4)
WBC: 5.2 x10E3/uL (ref 3.4–10.8)

## 2024-06-20 LAB — COMPREHENSIVE METABOLIC PANEL WITH GFR
ALT: 8 IU/L (ref 0–44)
AST: 20 IU/L (ref 0–40)
Albumin: 3.7 g/dL — ABNORMAL LOW (ref 3.9–4.9)
Alkaline Phosphatase: 158 IU/L — ABNORMAL HIGH (ref 47–123)
BUN/Creatinine Ratio: 11 (ref 10–24)
BUN: 9 mg/dL (ref 8–27)
Bilirubin Total: 0.4 mg/dL (ref 0.0–1.2)
CO2: 23 mmol/L (ref 20–29)
Calcium: 8.9 mg/dL (ref 8.6–10.2)
Chloride: 101 mmol/L (ref 96–106)
Creatinine, Ser: 0.84 mg/dL (ref 0.76–1.27)
Globulin, Total: 4.1 g/dL (ref 1.5–4.5)
Glucose: 83 mg/dL (ref 70–99)
Potassium: 4.4 mmol/L (ref 3.5–5.2)
Sodium: 138 mmol/L (ref 134–144)
Total Protein: 7.8 g/dL (ref 6.0–8.5)
eGFR: 99 mL/min/1.73

## 2024-06-20 LAB — RPR, QUANT+TP ABS (REFLEX)
Rapid Plasma Reagin, Quant: 1:2 {titer} — ABNORMAL HIGH
T Pallidum Abs: REACTIVE — AB

## 2024-06-20 LAB — SYPHILIS: RPR W/REFLEX TO RPR TITER AND TREPONEMAL ANTIBODIES, TRADITIONAL SCREENING AND DIAGNOSIS ALGORITHM: RPR Ser Ql: REACTIVE — AB

## 2024-06-20 NOTE — Progress Notes (Signed)
 Good morning please let Caleb Brooks know his labs have returned and syphilis testing did return reactive as did t pallidum antibodies, which we look at with this. Ratio is on lower side, which could mean early stages. Have you ever been treated for syphilis in the past? We will need to report this to the health department and I need you to go there for further assessment and possible treatment as soon as possible. Any questions? I will let dermatology know as well since you are on Humira .  -- staff please send report to health department.

## 2024-06-20 NOTE — Progress Notes (Signed)
 Just FYI for you. I saw this patient recently for a sore in mouth and joint swelling and pain in hands and feet. I was a bit concerned and tested him for syphilis. This did return reactive as are t pallidum antibodies. Ratio is low, so could be early stage or false positive. I am sending him to the health department for further assessment, especially with him being on Humira . Wanted to alert you.I suspect they will treat. He had no other positive findings on exam other than the mouth sore and joint issues. Does have a history of STDs he reports. Have a great day!!

## 2024-06-21 ENCOUNTER — Ambulatory Visit

## 2024-06-21 DIAGNOSIS — L732 Hidradenitis suppurativa: Secondary | ICD-10-CM

## 2024-06-21 MED ORDER — ADALIMUMAB 80 MG/0.8ML ~~LOC~~ AJKT
80.0000 mg | AUTO-INJECTOR | Freq: Once | SUBCUTANEOUS | Status: AC
  Administered 2024-06-21: 80 mg via SUBCUTANEOUS

## 2024-06-21 NOTE — Progress Notes (Signed)
 Patient here today for Humira  injection for Hidradenitis suppurativa patient supplied.   Humira  80mg /0.33mL injected into left upper arm. Patient tolerated injection well.   Patient requested to do it in his left arm again this week due to the arthritis in his right arm was acting up really bad today.   NDC: 9925-9875-88 LOT: [id number] EXP: 04/2025    Caleb Brooks, RMA

## 2024-06-21 NOTE — Telephone Encounter (Unsigned)
 Copied from CRM [id number]. Topic: Clinical - Lab/Test Results >> Jun 20, 2024  1:43 PM Delon T wrote: Reason for CRM: Patient is asking if the office would be willing to call the health department for him and give the information for him to report to them without having to say what he needs in public- he is upset about the possible positive syphilis result  [phone]

## 2024-06-29 ENCOUNTER — Telehealth: Payer: Self-pay

## 2024-06-29 ENCOUNTER — Ambulatory Visit: Admitting: Family Medicine

## 2024-06-29 DIAGNOSIS — Z113 Encounter for screening for infections with a predominantly sexual mode of transmission: Secondary | ICD-10-CM

## 2024-06-29 DIAGNOSIS — Z8619 Personal history of other infectious and parasitic diseases: Secondary | ICD-10-CM

## 2024-06-29 LAB — HM HEPATITIS C SCREENING LAB: HM Hepatitis Screen: NEGATIVE

## 2024-06-29 LAB — HM HIV SCREENING LAB: HM HIV Screening: NEGATIVE

## 2024-06-29 NOTE — Telephone Encounter (Signed)
 Larraine JONELLE Novak, RN

## 2024-06-30 NOTE — Progress Notes (Signed)
 " [facility] Department STI clinic [address] [place] [address] Main phone: [phone]  STI screening visit  Subjective:  Caleb Brooks is a 64 y.o. male being seen today for an STI screening visit. The patient reports they do not have symptoms.    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 Per chart review- pt has a significant hx of gout, HS as well as other co morbidities- and presented to PCP with a mouth pain. On exam at that time he had redness and inflammation present. Treated with Augmentin  BID. PCP ran labs including RPR which resulted at 1:2. 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 When asked if his mouth had been painful he states no I felt it with my tongue. States his mouth is better now, but his hands and knees are still swollen. He hopes that he can get medication today for his inflammation. I reviewed with him that due to previous syphilis infection and treatment, he will likely remain reactive for life. It is unclear what his titers are and whether this is a new infection vs unrelated mouth pain.  Of note, patient reports he has not been sexually active in about 10 months. Endorses receiving oral sex.   Reproductive  considerations: Does the patient or their partner desires a pregnancy in the next year? No  See flowsheet for further details and programmatic requirements  Hyperlink available at the top of the signed note in blue.  Flow sheet content below:  Pregnancy Intention Screening Does the patient want to become pregnant in the next year?: N/A Does the patient's partner want to become pregnant in the next year?: No Would the patient like to discuss contraceptive options today?: N/A Reason For STD Screen STD Screening: Was referred Have you ever had an STD?: Yes History of Antibiotic use in the past 2 weeks?: Yes STD Symptoms Oral / Other skin ulcer: Yes Risk Factors for Hep B Household, sexual, or needle sharing contact of a person infected with Hep B: No Sexual contact with a person who uses drugs not as prescribed?: No Currently or Ever used drugs not as prescribed: No HIV Positive: No PRep Patient: No Men who have sex with men: No Have Hepatitis C: No History of Incarceration: Yes History of Homeslessness?: No Anal sex following anal drug use?: No Risk Factors for Hep C Currently using drugs not as prescribed: No Sexual partner(s) currently using drugs as not prescribed: No History of drug use: No HIV Positive: No People with a history of incarceration: Yes People born between the years of 30 and 1965: No Hepatitis Counseling Hep B Counseling: Patient accepts testing for Hep B today Hep C Counseling: Patient accepts testing for Hep C today Abuse History  Has patient ever been abused physically?: No Has patient ever been abused sexually?: No Does patient feel they have a problem with Anxiety?: No Does patient feel they have a problem with Depression?: No Counseling Patient counseled to use condoms with all sex: Condoms declined RTC in 2-3 weeks for test results: Yes Clinic will call if test results abnormal before test result appt.: Yes Immunizations: Referred, Immunization  history assessed Test results given to patient Patient counseled to use condoms with all sex: Condoms declined  Screening for MPX risk:  Unexplained rash?  No   MSM?  No   Multiple or anonymous sex partners?  No   Any close or sexual contact with a person  diagnosed with MPX?  No   Any outside the [place]  where MPX is endemic?  No   High clinical suspicion for MPX?    -Unlikely to be chickenpox    -Lymphadenopathy    -Rash that presents in same phase of       evolution on any given body part  No   Does this patient meet CDC recommendations for vaccination against MPOX? No  You already have or anticipate having the following risks:  Your sex partner has the following risks: You're traveling to a county with a clade I MPOX outbreak and anticipate these risks: Occupational exposure  You had known or suspected exposure to someone with monkeypox You had a sex partner in the past 2 weeks who was diagnosed with monkeypox You are a gay, bisexual, or other man who has sex with men, or are transgender or nonbinary and in the past 6 months have had any of the following: - A new diagnosis of one or more sexually transmitted diseases (e.g., chlamydia, gonorrhea, or syphilis) - More than one sex partner You have had any of the following in the past 6 months: - Sex at a commercial sex venue (like a sex club or bathhouse) - Sex related to a large commercial event   or in a geographic area (city or county for example) where mpox virus transmission is occurring Sex with a new partner Sex at a commercial sex venue (e.g., a sex club or bathhouse) Sex in it consultant for money, goods, drugs, or other trade Sex in association with a large public event (e.g., a rave, party, or festival) i.e. certain people who work in a laboratory or healthcare facility   Infectious disease screenings: Vaccinated against HPV? Unknown  HIV Ever had a positive? No Last test: 10/2023 Results in chart:  No results found for:  HMHIVSCREEN  Lab Results  Component Value Date   HIV Non Reactive 10/11/2023     Hep B Hep B status: unknown or no prior testing Received HBV vaccination? Unknown Received HBV testing for immunity? Unknown Results in chart:  No components found for: HMHEPBSCREEN  Do they qualify for HBV screening today? Yes and and accepts testing today Criteria:  -Household, sexual or needle sharing contact with HBV -History of drug use or homelessness -HIV positive -Those with known Hep C  Hep C Hep C status: unknown or no prior testing Results in chart:  No results found for: HMHEPCSCREEN No components found for: HEPC  Do they qualify for HCV screening today? Yes and and accepts testing today Criteria - since the last HCV result, does the patient have any of the following? - Current drug use - Have a partner with drug use - Has been incarcerated  Immunization history:  Immunization History  Administered Date(s) Administered  HIB (PRP-T) 04/18/2020   Influenza, Seasonal, Injecte, Preservative Fre 04/13/2023, 04/19/2024   Influenza,inj,Quad PF,6+ Mos 04/18/2020, 07/07/2021   Meningococcal B, OMV 05/10/2020, 06/20/2020   Meningococcal Mcv4o 04/18/2020   PFIZER(Purple Top)SARS-COV-2 Vaccination 12/08/2019, 01/30/2020   Pfizer(Comirnaty)Fall Seasonal Vaccine 12 years and older 04/13/2023   Pneumococcal Conjugate-13 04/18/2020   Pneumococcal Polysaccharide-23 05/08/2020   Tdap 04/18/2020    The following portions of the patient's history were reviewed and updated as appropriate: allergies, current medications, past medical history, past social history, past surgical history and problem list.  Substance use screenings:  Uses tobacco products? No Uses vapes? No Uses alcohol? No Uses non-injectable substances that alter your mental status? No Uses non-prescribed injectable substances? No  Immunization History  Administered Date(s) Administered   HIB (PRP-T) 04/18/2020    Influenza, Seasonal, Injecte, Preservative Fre 04/13/2023, 04/19/2024   Influenza,inj,Quad PF,6+ Mos 04/18/2020, 07/07/2021   Meningococcal B, OMV 05/10/2020, 06/20/2020   Meningococcal Mcv4o 04/18/2020   PFIZER(Purple Top)SARS-COV-2 Vaccination 12/08/2019, 01/30/2020   Pfizer(Comirnaty)Fall Seasonal Vaccine 12 years and older 04/13/2023   Pneumococcal Conjugate-13 04/18/2020   Pneumococcal Polysaccharide-23 05/08/2020   Tdap 04/18/2020    The following portions of the patient's history were reviewed and updated as appropriate: allergies, current medications, past medical history, past social history, past surgical history and problem list.  Objective:  There were no vitals filed for this visit.  Physical Exam Exam conducted with a chaperone present Brett Orange CNA).  Constitutional:      Appearance: Normal appearance.  HENT:     Head: Normocephalic and atraumatic.     Comments: No nits or hair loss on scalp, brows, or lashes    Mouth/Throat:     Mouth: Mucous membranes are moist. No oral lesions.     Pharynx: Oropharynx is clear. No oropharyngeal exudate or posterior oropharyngeal erythema.  Eyes:     General:        Right eye: No discharge.        Left eye: No discharge.     Conjunctiva/sclera: Conjunctivae normal.     Right eye: Right conjunctiva is not injected. No exudate.    Left eye: Left conjunctiva is not injected. No exudate. Pulmonary:     Effort: Pulmonary effort is normal.  Abdominal:     Palpations: There is no hepatomegaly.     Tenderness: There is no abdominal tenderness. There is no rebound.  Genitourinary:    Pubic Area: No rash or pubic lice (no nits).      Penis: Normal and uncircumcised. No discharge or lesions.      Testes: Normal.     Epididymis:     Right: Normal. No mass or tenderness.     Left: Normal. No mass or tenderness.     Tanner stage (genital): 5.     Rectum: Tenderness: no lesions or discharge.       Comments: Penile  Discharge Amount: none Color:  none Lymphadenopathy:     Cervical: No cervical adenopathy.     Upper Body:     Right upper body: No supraclavicular, axillary or epitrochlear adenopathy.     Left upper body: No supraclavicular, axillary or epitrochlear adenopathy.     Lower Body: No right inguinal adenopathy. No left inguinal adenopathy.  Skin:    General: Skin is warm and dry.     Findings: No lesion or rash.         Comments: Generalized rash on back- discolored spots  Neurological:     Mental Status:  He is alert and oriented to person, place, and time.      Assessment and Plan:  Caleb Brooks is a 64 y.o. male presenting to the [facility] Department for STI screening.  Patient accepted the following screenings: urine CT/GC, HIV, RPR, Hep B, and Hep C  1. History of syphilis (Primary) -prior syphilis treated 40-50 years ago per pt report -state consultant Dama Byes called- confirmed there are no records for him -while patient did have some discoloration on back, and the spot in his mouth, the presentation of symptoms do not fit the timeline for last sexual encounter or appear as a syphilis rash or chancre -pt has a long hx of HS and gout -if patient truly was reinfected- I would expect his RPR to be significantly elevated from 06/16/24 -agrees to redraw today and pending numbers will retreat with fourfold increase  - HIV/HCV [facility] - HBV Antigen/Antibody [facility] - Syphilis Serology, [facility] - Chlamydia/GC N[facility], Confirmation  2. Screening for venereal disease    Counseling: Recommended condom use with all sex Discussed importance of condom use for STI prevention Discussed time line for State Lab results and that patient will be called with positive results and encouraged patient to call if they had not heard in 2 weeks Recommended repeat testing in 3 months with positive results. Recommended returning for continued or worsening symptoms.    Return if symptoms worsen or fail to improve, for STI screening.  Future Appointments  Date Time Provider Department Center  07/05/2024  2:30 PM [facility] None  10/17/2024  1:20 PM Melvin Pao, NP [facility] [address]  10/31/2024 12:00 PM Hester Alm BROCKS, MD [facility] None    Verneta Bers, [place] "

## 2024-07-02 LAB — CHLAMYDIA/GC NAA, CONFIRMATION
Chlamydia trachomatis, NAA: NEGATIVE
Neisseria gonorrhoeae, NAA: NEGATIVE

## 2024-07-03 ENCOUNTER — Ambulatory Visit: Payer: Self-pay | Admitting: Family Medicine

## 2024-07-05 ENCOUNTER — Ambulatory Visit

## 2024-07-14 LAB — HBV ANTIGEN/ANTIBODY STATE LAB
Hep B Core Total Ab: NONREACTIVE
Hep B S Ab: NONREACTIVE
Hepatitis B Surface Ag: NONREACTIVE

## 2024-10-17 ENCOUNTER — Encounter: Admitting: Nurse Practitioner

## 2024-10-31 ENCOUNTER — Ambulatory Visit: Admitting: Dermatology
